# Patient Record
Sex: Female | Born: 1950
Health system: Southern US, Community
[De-identification: ages and names within clinical notes are randomized; demographics above are authoritative.]

## PROBLEM LIST (undated history)

## (undated) DIAGNOSIS — B20 Human immunodeficiency virus [HIV] disease: Secondary | ICD-10-CM

## (undated) DIAGNOSIS — Z923 Personal history of irradiation: Secondary | ICD-10-CM

## (undated) DIAGNOSIS — M4800 Spinal stenosis, site unspecified: Secondary | ICD-10-CM

## (undated) DIAGNOSIS — I10 Essential (primary) hypertension: Secondary | ICD-10-CM

## (undated) DIAGNOSIS — Z9221 Personal history of antineoplastic chemotherapy: Secondary | ICD-10-CM

## (undated) DIAGNOSIS — G629 Polyneuropathy, unspecified: Secondary | ICD-10-CM

## (undated) DIAGNOSIS — C50412 Malignant neoplasm of upper-outer quadrant of left female breast: Secondary | ICD-10-CM

## (undated) DIAGNOSIS — G43909 Migraine, unspecified, not intractable, without status migrainosus: Secondary | ICD-10-CM

## (undated) DIAGNOSIS — Z9889 Other specified postprocedural states: Secondary | ICD-10-CM

## (undated) DIAGNOSIS — R112 Nausea with vomiting, unspecified: Secondary | ICD-10-CM

## (undated) DIAGNOSIS — R21 Rash and other nonspecific skin eruption: Secondary | ICD-10-CM

## (undated) DIAGNOSIS — Z21 Asymptomatic human immunodeficiency virus [HIV] infection status: Secondary | ICD-10-CM

## (undated) HISTORY — DX: Polyneuropathy, unspecified: G62.9

## (undated) HISTORY — DX: Malignant neoplasm of upper-outer quadrant of left female breast: C50.412

## (undated) HISTORY — DX: Rash and other nonspecific skin eruption: R21

## (undated) HISTORY — PX: TONSILLECTOMY: SUR1361

## (undated) HISTORY — PX: CARPAL TUNNEL RELEASE: SHX101

---

## 1987-01-29 HISTORY — PX: CERVICAL FUSION: SHX112

## 1988-01-29 HISTORY — PX: ABDOMINAL HYSTERECTOMY: SHX81

## 1990-11-19 ENCOUNTER — Encounter (INDEPENDENT_AMBULATORY_CARE_PROVIDER_SITE_OTHER): Payer: Self-pay | Admitting: *Deleted

## 1990-11-19 LAB — CONVERTED CEMR LAB: CD4 T Cell Abs: 218

## 1997-05-02 ENCOUNTER — Encounter: Admission: RE | Admit: 1997-05-02 | Discharge: 1997-05-02 | Payer: Self-pay | Admitting: Infectious Diseases

## 1997-05-13 ENCOUNTER — Encounter: Admission: RE | Admit: 1997-05-13 | Discharge: 1997-05-13 | Payer: Self-pay | Admitting: Infectious Diseases

## 1997-08-08 ENCOUNTER — Encounter: Admission: RE | Admit: 1997-08-08 | Discharge: 1997-08-08 | Payer: Self-pay | Admitting: Infectious Diseases

## 1997-08-25 ENCOUNTER — Encounter: Admission: RE | Admit: 1997-08-25 | Discharge: 1997-08-25 | Payer: Self-pay | Admitting: Infectious Diseases

## 1997-09-27 ENCOUNTER — Encounter: Admission: RE | Admit: 1997-09-27 | Discharge: 1997-09-27 | Payer: Self-pay | Admitting: Obstetrics and Gynecology

## 1997-10-06 ENCOUNTER — Encounter: Admission: RE | Admit: 1997-10-06 | Discharge: 1997-10-06 | Payer: Self-pay | Admitting: Infectious Diseases

## 1997-12-05 ENCOUNTER — Encounter: Admission: RE | Admit: 1997-12-05 | Discharge: 1997-12-05 | Payer: Self-pay | Admitting: Internal Medicine

## 1997-12-19 ENCOUNTER — Encounter: Admission: RE | Admit: 1997-12-19 | Discharge: 1997-12-19 | Payer: Self-pay | Admitting: Infectious Diseases

## 1998-01-05 ENCOUNTER — Encounter: Admission: RE | Admit: 1998-01-05 | Discharge: 1998-01-05 | Payer: Self-pay | Admitting: Infectious Diseases

## 1998-01-25 ENCOUNTER — Ambulatory Visit (HOSPITAL_COMMUNITY): Admission: RE | Admit: 1998-01-25 | Discharge: 1998-01-25 | Payer: Self-pay | Admitting: Infectious Diseases

## 1998-01-25 ENCOUNTER — Encounter: Payer: Self-pay | Admitting: Infectious Diseases

## 1998-03-28 ENCOUNTER — Ambulatory Visit (HOSPITAL_COMMUNITY): Admission: RE | Admit: 1998-03-28 | Discharge: 1998-03-28 | Payer: Self-pay | Admitting: Infectious Diseases

## 1998-04-13 ENCOUNTER — Encounter: Admission: RE | Admit: 1998-04-13 | Discharge: 1998-04-13 | Payer: Self-pay | Admitting: Internal Medicine

## 1998-07-06 ENCOUNTER — Ambulatory Visit (HOSPITAL_COMMUNITY): Admission: RE | Admit: 1998-07-06 | Discharge: 1998-07-06 | Payer: Self-pay | Admitting: Internal Medicine

## 1998-07-21 ENCOUNTER — Encounter: Admission: RE | Admit: 1998-07-21 | Discharge: 1998-07-21 | Payer: Self-pay | Admitting: Infectious Diseases

## 1998-12-04 ENCOUNTER — Encounter: Admission: RE | Admit: 1998-12-04 | Discharge: 1998-12-04 | Payer: Self-pay | Admitting: Infectious Diseases

## 1999-01-01 ENCOUNTER — Encounter: Admission: RE | Admit: 1999-01-01 | Discharge: 1999-01-01 | Payer: Self-pay | Admitting: Internal Medicine

## 1999-01-01 ENCOUNTER — Ambulatory Visit (HOSPITAL_COMMUNITY): Admission: RE | Admit: 1999-01-01 | Discharge: 1999-01-01 | Payer: Self-pay | Admitting: Internal Medicine

## 1999-01-19 ENCOUNTER — Encounter: Admission: RE | Admit: 1999-01-19 | Discharge: 1999-01-19 | Payer: Self-pay | Admitting: Infectious Diseases

## 1999-01-26 ENCOUNTER — Encounter: Payer: Self-pay | Admitting: Infectious Diseases

## 1999-01-26 ENCOUNTER — Ambulatory Visit (HOSPITAL_COMMUNITY): Admission: RE | Admit: 1999-01-26 | Discharge: 1999-01-26 | Payer: Self-pay | Admitting: Infectious Diseases

## 1999-04-19 ENCOUNTER — Encounter: Admission: RE | Admit: 1999-04-19 | Discharge: 1999-04-19 | Payer: Self-pay | Admitting: Internal Medicine

## 1999-04-19 ENCOUNTER — Ambulatory Visit (HOSPITAL_COMMUNITY): Admission: RE | Admit: 1999-04-19 | Discharge: 1999-04-19 | Payer: Self-pay | Admitting: Internal Medicine

## 1999-05-03 ENCOUNTER — Encounter: Admission: RE | Admit: 1999-05-03 | Discharge: 1999-05-03 | Payer: Self-pay | Admitting: Infectious Diseases

## 1999-06-04 ENCOUNTER — Encounter: Admission: RE | Admit: 1999-06-04 | Discharge: 1999-06-04 | Payer: Self-pay | Admitting: Internal Medicine

## 1999-06-05 ENCOUNTER — Encounter: Payer: Self-pay | Admitting: Infectious Diseases

## 1999-06-05 ENCOUNTER — Ambulatory Visit (HOSPITAL_COMMUNITY): Admission: RE | Admit: 1999-06-05 | Discharge: 1999-06-05 | Payer: Self-pay | Admitting: Infectious Diseases

## 1999-06-06 ENCOUNTER — Encounter: Admission: RE | Admit: 1999-06-06 | Discharge: 1999-06-06 | Payer: Self-pay | Admitting: Internal Medicine

## 1999-06-13 ENCOUNTER — Encounter: Admission: RE | Admit: 1999-06-13 | Discharge: 1999-06-13 | Payer: Self-pay | Admitting: Infectious Diseases

## 1999-06-29 ENCOUNTER — Encounter: Admission: RE | Admit: 1999-06-29 | Discharge: 1999-06-29 | Payer: Self-pay | Admitting: Infectious Diseases

## 1999-08-28 ENCOUNTER — Encounter: Admission: RE | Admit: 1999-08-28 | Discharge: 1999-08-28 | Payer: Self-pay | Admitting: Infectious Diseases

## 1999-08-28 ENCOUNTER — Ambulatory Visit (HOSPITAL_COMMUNITY): Admission: RE | Admit: 1999-08-28 | Discharge: 1999-08-28 | Payer: Self-pay | Admitting: Infectious Diseases

## 1999-09-13 ENCOUNTER — Encounter: Admission: RE | Admit: 1999-09-13 | Discharge: 1999-09-13 | Payer: Self-pay | Admitting: Infectious Diseases

## 1999-12-04 ENCOUNTER — Encounter: Admission: RE | Admit: 1999-12-04 | Discharge: 1999-12-04 | Payer: Self-pay | Admitting: Hematology and Oncology

## 1999-12-18 ENCOUNTER — Ambulatory Visit (HOSPITAL_COMMUNITY): Admission: RE | Admit: 1999-12-18 | Discharge: 1999-12-18 | Payer: Self-pay | Admitting: Infectious Diseases

## 1999-12-18 ENCOUNTER — Encounter: Admission: RE | Admit: 1999-12-18 | Discharge: 1999-12-18 | Payer: Self-pay | Admitting: Infectious Diseases

## 2000-01-03 ENCOUNTER — Encounter: Admission: RE | Admit: 2000-01-03 | Discharge: 2000-01-03 | Payer: Self-pay | Admitting: Infectious Diseases

## 2000-01-28 ENCOUNTER — Encounter: Payer: Self-pay | Admitting: Infectious Diseases

## 2000-01-28 ENCOUNTER — Ambulatory Visit (HOSPITAL_COMMUNITY): Admission: RE | Admit: 2000-01-28 | Discharge: 2000-01-28 | Payer: Self-pay | Admitting: Infectious Diseases

## 2000-02-29 ENCOUNTER — Encounter: Admission: RE | Admit: 2000-02-29 | Discharge: 2000-02-29 | Payer: Self-pay | Admitting: Infectious Diseases

## 2000-04-10 ENCOUNTER — Encounter: Admission: RE | Admit: 2000-04-10 | Discharge: 2000-04-10 | Payer: Self-pay | Admitting: Infectious Diseases

## 2000-04-10 ENCOUNTER — Ambulatory Visit (HOSPITAL_COMMUNITY): Admission: RE | Admit: 2000-04-10 | Discharge: 2000-04-10 | Payer: Self-pay | Admitting: Infectious Diseases

## 2000-04-24 ENCOUNTER — Encounter: Admission: RE | Admit: 2000-04-24 | Discharge: 2000-04-24 | Payer: Self-pay | Admitting: Infectious Diseases

## 2000-07-21 ENCOUNTER — Ambulatory Visit (HOSPITAL_COMMUNITY): Admission: RE | Admit: 2000-07-21 | Discharge: 2000-07-21 | Payer: Self-pay | Admitting: Infectious Diseases

## 2000-07-21 ENCOUNTER — Encounter: Admission: RE | Admit: 2000-07-21 | Discharge: 2000-07-21 | Payer: Self-pay | Admitting: Internal Medicine

## 2000-08-14 ENCOUNTER — Encounter: Admission: RE | Admit: 2000-08-14 | Discharge: 2000-08-14 | Payer: Self-pay | Admitting: Infectious Diseases

## 2000-09-08 ENCOUNTER — Ambulatory Visit (HOSPITAL_COMMUNITY): Admission: RE | Admit: 2000-09-08 | Discharge: 2000-09-08 | Payer: Self-pay | Admitting: Infectious Diseases

## 2000-09-08 ENCOUNTER — Encounter: Admission: RE | Admit: 2000-09-08 | Discharge: 2000-09-08 | Payer: Self-pay | Admitting: Infectious Diseases

## 2000-10-09 ENCOUNTER — Encounter: Admission: RE | Admit: 2000-10-09 | Discharge: 2000-10-09 | Payer: Self-pay | Admitting: Infectious Diseases

## 2000-11-13 ENCOUNTER — Encounter: Admission: RE | Admit: 2000-11-13 | Discharge: 2000-11-13 | Payer: Self-pay | Admitting: Infectious Diseases

## 2000-12-23 ENCOUNTER — Encounter: Admission: RE | Admit: 2000-12-23 | Discharge: 2000-12-23 | Payer: Self-pay | Admitting: Infectious Diseases

## 2000-12-23 ENCOUNTER — Ambulatory Visit (HOSPITAL_COMMUNITY): Admission: RE | Admit: 2000-12-23 | Discharge: 2000-12-23 | Payer: Self-pay | Admitting: Infectious Diseases

## 2001-01-08 ENCOUNTER — Encounter: Admission: RE | Admit: 2001-01-08 | Discharge: 2001-01-08 | Payer: Self-pay | Admitting: Infectious Diseases

## 2001-02-02 ENCOUNTER — Encounter: Payer: Self-pay | Admitting: Infectious Diseases

## 2001-02-02 ENCOUNTER — Ambulatory Visit (HOSPITAL_COMMUNITY): Admission: RE | Admit: 2001-02-02 | Discharge: 2001-02-02 | Payer: Self-pay | Admitting: Infectious Diseases

## 2001-04-14 ENCOUNTER — Ambulatory Visit (HOSPITAL_COMMUNITY): Admission: RE | Admit: 2001-04-14 | Discharge: 2001-04-14 | Payer: Self-pay | Admitting: Internal Medicine

## 2001-04-14 ENCOUNTER — Encounter: Admission: RE | Admit: 2001-04-14 | Discharge: 2001-04-14 | Payer: Self-pay | Admitting: Internal Medicine

## 2001-05-13 ENCOUNTER — Encounter: Admission: RE | Admit: 2001-05-13 | Discharge: 2001-05-13 | Payer: Self-pay | Admitting: Infectious Diseases

## 2001-05-27 ENCOUNTER — Encounter: Admission: RE | Admit: 2001-05-27 | Discharge: 2001-05-27 | Payer: Self-pay | Admitting: Infectious Diseases

## 2001-07-01 ENCOUNTER — Encounter: Admission: RE | Admit: 2001-07-01 | Discharge: 2001-07-01 | Payer: Self-pay | Admitting: Internal Medicine

## 2001-07-01 ENCOUNTER — Ambulatory Visit (HOSPITAL_COMMUNITY): Admission: RE | Admit: 2001-07-01 | Discharge: 2001-07-01 | Payer: Self-pay | Admitting: Infectious Diseases

## 2001-07-16 ENCOUNTER — Encounter: Admission: RE | Admit: 2001-07-16 | Discharge: 2001-07-16 | Payer: Self-pay | Admitting: Infectious Diseases

## 2001-08-26 ENCOUNTER — Encounter: Admission: RE | Admit: 2001-08-26 | Discharge: 2001-08-26 | Payer: Self-pay | Admitting: Infectious Diseases

## 2001-09-14 ENCOUNTER — Encounter: Admission: RE | Admit: 2001-09-14 | Discharge: 2001-09-14 | Payer: Self-pay | Admitting: Infectious Diseases

## 2001-09-14 ENCOUNTER — Ambulatory Visit (HOSPITAL_COMMUNITY): Admission: RE | Admit: 2001-09-14 | Discharge: 2001-09-14 | Payer: Self-pay | Admitting: Infectious Diseases

## 2001-10-01 ENCOUNTER — Encounter: Admission: RE | Admit: 2001-10-01 | Discharge: 2001-10-01 | Payer: Self-pay | Admitting: Infectious Diseases

## 2001-10-27 ENCOUNTER — Ambulatory Visit (HOSPITAL_COMMUNITY): Admission: RE | Admit: 2001-10-27 | Discharge: 2001-10-27 | Payer: Self-pay | Admitting: Infectious Diseases

## 2001-10-27 ENCOUNTER — Encounter: Admission: RE | Admit: 2001-10-27 | Discharge: 2001-10-27 | Payer: Self-pay | Admitting: Infectious Diseases

## 2001-11-12 ENCOUNTER — Encounter: Admission: RE | Admit: 2001-11-12 | Discharge: 2001-11-12 | Payer: Self-pay | Admitting: Infectious Diseases

## 2001-12-28 ENCOUNTER — Ambulatory Visit (HOSPITAL_COMMUNITY): Admission: RE | Admit: 2001-12-28 | Discharge: 2001-12-28 | Payer: Self-pay | Admitting: Infectious Diseases

## 2001-12-28 ENCOUNTER — Encounter: Admission: RE | Admit: 2001-12-28 | Discharge: 2001-12-28 | Payer: Self-pay | Admitting: Infectious Diseases

## 2002-01-19 ENCOUNTER — Encounter: Admission: RE | Admit: 2002-01-19 | Discharge: 2002-01-19 | Payer: Self-pay | Admitting: Infectious Diseases

## 2002-03-04 ENCOUNTER — Encounter: Admission: RE | Admit: 2002-03-04 | Discharge: 2002-03-04 | Payer: Self-pay | Admitting: Infectious Diseases

## 2002-04-14 ENCOUNTER — Encounter: Admission: RE | Admit: 2002-04-14 | Discharge: 2002-04-14 | Payer: Self-pay | Admitting: Infectious Diseases

## 2002-04-29 ENCOUNTER — Encounter: Admission: RE | Admit: 2002-04-29 | Discharge: 2002-04-29 | Payer: Self-pay | Admitting: Infectious Diseases

## 2002-07-12 ENCOUNTER — Encounter: Admission: RE | Admit: 2002-07-12 | Discharge: 2002-07-12 | Payer: Self-pay | Admitting: Infectious Diseases

## 2002-07-12 ENCOUNTER — Encounter: Payer: Self-pay | Admitting: Infectious Diseases

## 2002-07-12 ENCOUNTER — Ambulatory Visit (HOSPITAL_COMMUNITY): Admission: RE | Admit: 2002-07-12 | Discharge: 2002-07-12 | Payer: Self-pay | Admitting: Infectious Diseases

## 2002-09-09 ENCOUNTER — Encounter: Admission: RE | Admit: 2002-09-09 | Discharge: 2002-09-09 | Payer: Self-pay | Admitting: Infectious Diseases

## 2002-12-07 ENCOUNTER — Ambulatory Visit (HOSPITAL_COMMUNITY): Admission: RE | Admit: 2002-12-07 | Discharge: 2002-12-07 | Payer: Self-pay | Admitting: Infectious Diseases

## 2002-12-07 ENCOUNTER — Encounter: Payer: Self-pay | Admitting: Infectious Diseases

## 2002-12-07 ENCOUNTER — Encounter: Admission: RE | Admit: 2002-12-07 | Discharge: 2002-12-07 | Payer: Self-pay | Admitting: Infectious Diseases

## 2002-12-28 ENCOUNTER — Encounter: Admission: RE | Admit: 2002-12-28 | Discharge: 2002-12-28 | Payer: Self-pay | Admitting: Infectious Diseases

## 2003-03-23 ENCOUNTER — Encounter: Admission: RE | Admit: 2003-03-23 | Discharge: 2003-03-23 | Payer: Self-pay | Admitting: Infectious Diseases

## 2003-04-19 ENCOUNTER — Ambulatory Visit (HOSPITAL_COMMUNITY): Admission: RE | Admit: 2003-04-19 | Discharge: 2003-04-19 | Payer: Self-pay | Admitting: Infectious Diseases

## 2003-04-19 ENCOUNTER — Encounter: Admission: RE | Admit: 2003-04-19 | Discharge: 2003-04-19 | Payer: Self-pay | Admitting: Infectious Diseases

## 2003-05-06 ENCOUNTER — Encounter: Admission: RE | Admit: 2003-05-06 | Discharge: 2003-05-06 | Payer: Self-pay | Admitting: Infectious Diseases

## 2003-06-21 ENCOUNTER — Encounter: Admission: RE | Admit: 2003-06-21 | Discharge: 2003-06-21 | Payer: Self-pay | Admitting: Infectious Diseases

## 2003-06-27 ENCOUNTER — Emergency Department (HOSPITAL_COMMUNITY): Admission: EM | Admit: 2003-06-27 | Discharge: 2003-06-28 | Payer: Self-pay | Admitting: *Deleted

## 2003-08-19 ENCOUNTER — Encounter: Admission: RE | Admit: 2003-08-19 | Discharge: 2003-08-19 | Payer: Self-pay | Admitting: Infectious Diseases

## 2003-12-14 ENCOUNTER — Ambulatory Visit (HOSPITAL_COMMUNITY): Admission: RE | Admit: 2003-12-14 | Discharge: 2003-12-14 | Payer: Self-pay | Admitting: Infectious Diseases

## 2003-12-14 ENCOUNTER — Ambulatory Visit: Payer: Self-pay | Admitting: Infectious Diseases

## 2003-12-30 ENCOUNTER — Ambulatory Visit: Payer: Self-pay | Admitting: Infectious Diseases

## 2004-01-31 ENCOUNTER — Ambulatory Visit (HOSPITAL_COMMUNITY): Admission: RE | Admit: 2004-01-31 | Discharge: 2004-01-31 | Payer: Self-pay | Admitting: Infectious Diseases

## 2004-02-09 ENCOUNTER — Ambulatory Visit: Payer: Self-pay | Admitting: Infectious Diseases

## 2004-03-09 ENCOUNTER — Ambulatory Visit: Payer: Self-pay | Admitting: Infectious Diseases

## 2004-03-29 ENCOUNTER — Ambulatory Visit (HOSPITAL_COMMUNITY): Admission: RE | Admit: 2004-03-29 | Discharge: 2004-03-29 | Payer: Self-pay | Admitting: Neurosurgery

## 2004-04-11 ENCOUNTER — Ambulatory Visit: Payer: Self-pay | Admitting: Infectious Diseases

## 2004-04-11 ENCOUNTER — Ambulatory Visit (HOSPITAL_COMMUNITY): Admission: RE | Admit: 2004-04-11 | Discharge: 2004-04-11 | Payer: Self-pay | Admitting: Infectious Diseases

## 2004-04-30 ENCOUNTER — Ambulatory Visit (HOSPITAL_COMMUNITY): Admission: RE | Admit: 2004-04-30 | Discharge: 2004-04-30 | Payer: Self-pay | Admitting: Neurosurgery

## 2004-05-10 ENCOUNTER — Ambulatory Visit: Payer: Self-pay | Admitting: Infectious Diseases

## 2004-05-16 ENCOUNTER — Encounter: Admission: RE | Admit: 2004-05-16 | Discharge: 2004-05-16 | Payer: Self-pay | Admitting: Orthopaedic Surgery

## 2004-06-01 ENCOUNTER — Encounter: Admission: RE | Admit: 2004-06-01 | Discharge: 2004-06-01 | Payer: Self-pay | Admitting: Orthopaedic Surgery

## 2004-07-05 ENCOUNTER — Encounter: Admission: RE | Admit: 2004-07-05 | Discharge: 2004-07-05 | Payer: Self-pay | Admitting: Orthopaedic Surgery

## 2004-08-08 ENCOUNTER — Ambulatory Visit (HOSPITAL_COMMUNITY): Admission: RE | Admit: 2004-08-08 | Discharge: 2004-08-08 | Payer: Self-pay | Admitting: Infectious Diseases

## 2004-08-08 ENCOUNTER — Ambulatory Visit: Payer: Self-pay | Admitting: Infectious Diseases

## 2004-08-23 ENCOUNTER — Ambulatory Visit: Payer: Self-pay | Admitting: Infectious Diseases

## 2004-09-20 ENCOUNTER — Ambulatory Visit: Payer: Self-pay | Admitting: Infectious Diseases

## 2004-11-08 ENCOUNTER — Ambulatory Visit (HOSPITAL_COMMUNITY): Admission: RE | Admit: 2004-11-08 | Discharge: 2004-11-08 | Payer: Self-pay | Admitting: Infectious Diseases

## 2004-11-08 ENCOUNTER — Ambulatory Visit: Payer: Self-pay | Admitting: Infectious Diseases

## 2005-01-30 ENCOUNTER — Encounter (INDEPENDENT_AMBULATORY_CARE_PROVIDER_SITE_OTHER): Payer: Self-pay | Admitting: *Deleted

## 2005-01-30 ENCOUNTER — Ambulatory Visit: Payer: Self-pay | Admitting: Infectious Diseases

## 2005-01-30 ENCOUNTER — Ambulatory Visit (HOSPITAL_COMMUNITY): Admission: RE | Admit: 2005-01-30 | Discharge: 2005-01-30 | Payer: Self-pay | Admitting: Infectious Diseases

## 2005-01-30 LAB — CONVERTED CEMR LAB: CD4 Count: 800 microliters

## 2005-04-05 ENCOUNTER — Ambulatory Visit: Payer: Self-pay | Admitting: Infectious Diseases

## 2005-04-10 ENCOUNTER — Ambulatory Visit (HOSPITAL_COMMUNITY): Admission: RE | Admit: 2005-04-10 | Discharge: 2005-04-10 | Payer: Self-pay | Admitting: Internal Medicine

## 2005-07-10 ENCOUNTER — Encounter: Admission: RE | Admit: 2005-07-10 | Discharge: 2005-07-10 | Payer: Self-pay | Admitting: Infectious Diseases

## 2005-07-10 ENCOUNTER — Encounter (INDEPENDENT_AMBULATORY_CARE_PROVIDER_SITE_OTHER): Payer: Self-pay | Admitting: *Deleted

## 2005-07-10 ENCOUNTER — Ambulatory Visit: Payer: Self-pay | Admitting: Infectious Diseases

## 2005-07-10 LAB — CONVERTED CEMR LAB: HIV 1 RNA Quant: 399 copies/mL

## 2005-07-25 ENCOUNTER — Ambulatory Visit: Payer: Self-pay | Admitting: Infectious Diseases

## 2005-11-06 ENCOUNTER — Ambulatory Visit: Payer: Self-pay | Admitting: Internal Medicine

## 2005-11-06 ENCOUNTER — Encounter: Admission: RE | Admit: 2005-11-06 | Discharge: 2005-11-06 | Payer: Self-pay | Admitting: Internal Medicine

## 2005-11-06 ENCOUNTER — Encounter (INDEPENDENT_AMBULATORY_CARE_PROVIDER_SITE_OTHER): Payer: Self-pay | Admitting: *Deleted

## 2005-11-06 ENCOUNTER — Encounter: Payer: Self-pay | Admitting: Infectious Diseases

## 2005-11-06 LAB — CONVERTED CEMR LAB
ALT: 25 units/L (ref 0–40)
AST: 28 units/L (ref 0–37)
Basophils Absolute: 0.1 10*3/uL (ref 0.0–0.1)
Basophils percent auto: 1 % (ref 0–1)
CO2: 30 meq/L (ref 19–32)
Calcium: 9.7 mg/dL (ref 8.4–10.5)
Chloride: 99 meq/L (ref 96–112)
Creatinine, Ser: 0.76 mg/dL (ref 0.40–1.20)
HIV 1 RNA Quant: 77 copies/mL — ABNORMAL HIGH (ref <50)
Lymphocytes Relative: 36 % (ref 12–46)
MCHC: 34.1 g/dL (ref 30.0–36.0)
Monocytes Absolute: 0.6 10*3/uL (ref 0.2–0.7)
Neutro Abs: 4.1 10*3/uL (ref 1.7–7.7)
Neutrophils Relative %: 52 % (ref 43–77)
Platelets: 357 10*3/uL (ref 150–400)
RDW: 12.9 % (ref 11.5–14.0)
Sodium: 138 meq/L (ref 135–145)
Total Protein: 7.8 g/dL (ref 6.0–8.3)

## 2005-11-08 DIAGNOSIS — E785 Hyperlipidemia, unspecified: Secondary | ICD-10-CM | POA: Insufficient documentation

## 2005-11-08 DIAGNOSIS — B20 Human immunodeficiency virus [HIV] disease: Secondary | ICD-10-CM | POA: Insufficient documentation

## 2005-11-08 DIAGNOSIS — I10 Essential (primary) hypertension: Secondary | ICD-10-CM | POA: Insufficient documentation

## 2005-11-08 DIAGNOSIS — K5090 Crohn's disease, unspecified, without complications: Secondary | ICD-10-CM | POA: Insufficient documentation

## 2005-11-08 DIAGNOSIS — J449 Chronic obstructive pulmonary disease, unspecified: Secondary | ICD-10-CM | POA: Insufficient documentation

## 2005-11-08 DIAGNOSIS — R071 Chest pain on breathing: Secondary | ICD-10-CM | POA: Insufficient documentation

## 2005-11-22 ENCOUNTER — Ambulatory Visit: Payer: Self-pay | Admitting: Infectious Diseases

## 2005-12-26 ENCOUNTER — Ambulatory Visit: Payer: Self-pay | Admitting: Infectious Diseases

## 2006-02-05 ENCOUNTER — Encounter: Admission: RE | Admit: 2006-02-05 | Discharge: 2006-02-05 | Payer: Self-pay | Admitting: Infectious Diseases

## 2006-02-05 ENCOUNTER — Ambulatory Visit: Payer: Self-pay | Admitting: Infectious Diseases

## 2006-02-05 ENCOUNTER — Encounter (INDEPENDENT_AMBULATORY_CARE_PROVIDER_SITE_OTHER): Payer: Self-pay | Admitting: *Deleted

## 2006-02-05 LAB — CONVERTED CEMR LAB
CD4 Count: 420 microliters
HIV 1 RNA Quant: 246 copies/mL

## 2006-02-06 ENCOUNTER — Encounter: Payer: Self-pay | Admitting: Infectious Diseases

## 2006-02-06 LAB — CONVERTED CEMR LAB
ALT: 15 units/L (ref 0–35)
AST: 18 units/L (ref 0–37)
Alkaline Phosphatase: 121 units/L — ABNORMAL HIGH (ref 39–117)
BUN: 14 mg/dL (ref 6–23)
Basophils Absolute: 0 10*3/uL (ref 0.0–0.1)
Basophils Relative: 0 % (ref 0–1)
Creatinine, Ser: 0.7 mg/dL (ref 0.40–1.20)
Eosinophils Relative: 1 % (ref 0–5)
HCT: 45 % (ref 36.0–46.0)
HDL: 50 mg/dL (ref >39)
LDL Cholesterol: 168 mg/dL — ABNORMAL HIGH (ref 0–99)
Lymphs Abs: 1.3 10*3/uL (ref 0.7–3.3)
MCHC: 33.6 g/dL (ref 30.0–36.0)
MCV: 95.5 fL (ref 78.0–100.0)
Monocytes Relative: 10 % (ref 3–11)
Platelets: 340 10*3/uL (ref 150–400)
RBC: 4.71 M/uL (ref 3.87–5.11)
Total CHOL/HDL Ratio: 5.1
VLDL: 35 mg/dL (ref 0–40)
WBC: 6.9 10*3/uL (ref 4.0–10.5)

## 2006-02-20 ENCOUNTER — Ambulatory Visit: Payer: Self-pay | Admitting: Infectious Diseases

## 2006-03-24 ENCOUNTER — Encounter (INDEPENDENT_AMBULATORY_CARE_PROVIDER_SITE_OTHER): Payer: Self-pay | Admitting: *Deleted

## 2006-03-24 LAB — CONVERTED CEMR LAB

## 2006-04-06 ENCOUNTER — Encounter (INDEPENDENT_AMBULATORY_CARE_PROVIDER_SITE_OTHER): Payer: Self-pay | Admitting: *Deleted

## 2006-04-24 ENCOUNTER — Telehealth: Payer: Self-pay | Admitting: Infectious Diseases

## 2006-05-06 ENCOUNTER — Encounter: Payer: Self-pay | Admitting: Infectious Diseases

## 2006-05-22 ENCOUNTER — Encounter: Payer: Self-pay | Admitting: Infectious Diseases

## 2006-06-04 ENCOUNTER — Ambulatory Visit: Payer: Self-pay | Admitting: Infectious Diseases

## 2006-06-04 ENCOUNTER — Encounter: Admission: RE | Admit: 2006-06-04 | Discharge: 2006-06-04 | Payer: Self-pay | Admitting: Infectious Diseases

## 2006-06-04 LAB — CONVERTED CEMR LAB
ALT: 18 units/L (ref 0–35)
Basophils Absolute: 0 10*3/uL (ref 0.0–0.1)
CO2: 27 meq/L (ref 19–32)
Creatinine, Ser: 0.71 mg/dL (ref 0.40–1.20)
Eosinophils Absolute: 0.1 10*3/uL (ref 0.0–0.7)
Eosinophils Relative: 2 % (ref 0–5)
Glucose, Bld: 83 mg/dL (ref 70–99)
HCT: 44.2 % (ref 36.0–46.0)
HIV 1 RNA Quant: 91 copies/mL — ABNORMAL HIGH (ref <50)
HIV-1 RNA Quant, Log: 1.96 — ABNORMAL HIGH (ref <1.70)
Hemoglobin: 14.9 g/dL (ref 12.0–15.0)
Lymphocytes Relative: 36 % (ref 12–46)
MCV: 92.7 fL (ref 78.0–100.0)
Monocytes Absolute: 0.5 10*3/uL (ref 0.2–0.7)
RDW: 13.4 % (ref 11.5–14.0)
Total Bilirubin: 3 mg/dL — ABNORMAL HIGH (ref 0.3–1.2)

## 2006-06-05 ENCOUNTER — Encounter: Payer: Self-pay | Admitting: Infectious Diseases

## 2006-06-06 ENCOUNTER — Ambulatory Visit (HOSPITAL_COMMUNITY): Admission: RE | Admit: 2006-06-06 | Discharge: 2006-06-06 | Payer: Self-pay | Admitting: Family Medicine

## 2006-07-01 ENCOUNTER — Ambulatory Visit: Payer: Self-pay | Admitting: Infectious Diseases

## 2006-07-23 ENCOUNTER — Telehealth: Payer: Self-pay | Admitting: Infectious Diseases

## 2006-10-24 ENCOUNTER — Telehealth: Payer: Self-pay | Admitting: Infectious Diseases

## 2006-11-12 ENCOUNTER — Encounter: Admission: RE | Admit: 2006-11-12 | Discharge: 2006-11-12 | Payer: Self-pay | Admitting: Infectious Diseases

## 2006-11-12 ENCOUNTER — Ambulatory Visit: Payer: Self-pay | Admitting: Infectious Diseases

## 2006-11-12 LAB — CONVERTED CEMR LAB
Albumin: 4.5 g/dL (ref 3.5–5.2)
BUN: 19 mg/dL (ref 6–23)
Calcium: 9.3 mg/dL (ref 8.4–10.5)
Chloride: 100 meq/L (ref 96–112)
Glucose, Bld: 84 mg/dL (ref 70–99)
HIV-1 RNA Quant, Log: 2.44 — ABNORMAL HIGH (ref <1.70)
Lymphs Abs: 2.5 10*3/uL (ref 0.7–3.3)
Monocytes Relative: 8 % (ref 3–11)
Neutro Abs: 3.1 10*3/uL (ref 1.7–7.7)
Neutrophils Relative %: 49 % (ref 43–77)
Potassium: 4 meq/L (ref 3.5–5.3)
RBC: 4.73 M/uL (ref 3.87–5.11)
WBC: 6.3 10*3/uL (ref 4.0–10.5)

## 2006-12-02 ENCOUNTER — Ambulatory Visit: Payer: Self-pay | Admitting: Internal Medicine

## 2006-12-02 DIAGNOSIS — J069 Acute upper respiratory infection, unspecified: Secondary | ICD-10-CM | POA: Insufficient documentation

## 2006-12-03 ENCOUNTER — Telehealth: Payer: Self-pay | Admitting: Infectious Diseases

## 2007-01-27 ENCOUNTER — Encounter: Payer: Self-pay | Admitting: Infectious Diseases

## 2007-02-19 ENCOUNTER — Encounter: Payer: Self-pay | Admitting: Infectious Diseases

## 2007-02-19 ENCOUNTER — Ambulatory Visit: Payer: Self-pay | Admitting: Internal Medicine

## 2007-02-19 ENCOUNTER — Encounter: Admission: RE | Admit: 2007-02-19 | Discharge: 2007-02-19 | Payer: Self-pay | Admitting: Infectious Diseases

## 2007-02-19 LAB — CONVERTED CEMR LAB
ALT: 14 units/L (ref 0–35)
AST: 19 units/L (ref 0–37)
Albumin: 4.3 g/dL (ref 3.5–5.2)
Alkaline Phosphatase: 73 units/L (ref 39–117)
BUN: 11 mg/dL (ref 6–23)
Basophils Absolute: 0 10*3/uL (ref 0.0–0.1)
Basophils Relative: 1 % (ref 0–1)
CO2: 27 meq/L (ref 19–32)
Calcium: 9 mg/dL (ref 8.4–10.5)
Chloride: 101 meq/L (ref 96–112)
Cholesterol: 237 mg/dL — ABNORMAL HIGH (ref 0–200)
Creatinine, Ser: 0.71 mg/dL (ref 0.40–1.20)
Eosinophils Absolute: 0.1 10*3/uL (ref 0.0–0.7)
Eosinophils Relative: 2 % (ref 0–5)
Glucose, Bld: 85 mg/dL (ref 70–99)
HCT: 42.8 % (ref 36.0–46.0)
HDL: 51 mg/dL (ref >39)
HIV 1 RNA Quant: <50 copies/mL (ref <50)
HIV-1 RNA Quant, Log: <1.7 (ref <1.70)
Hemoglobin: 14.6 g/dL (ref 12.0–15.0)
LDL Cholesterol: 155 mg/dL — ABNORMAL HIGH (ref 0–99)
Lymphocytes Relative: 34 % (ref 12–46)
Lymphs Abs: 2.2 10*3/uL (ref 0.7–4.0)
MCHC: 34.1 g/dL (ref 30.0–36.0)
MCV: 92 fL (ref 78.0–100.0)
Monocytes Absolute: 0.5 10*3/uL (ref 0.1–1.0)
Monocytes Relative: 8 % (ref 3–12)
Neutro Abs: 3.7 10*3/uL (ref 1.7–7.7)
Neutrophils Relative %: 56 % (ref 43–77)
Platelets: 343 10*3/uL (ref 150–400)
Potassium: 3.4 meq/L — ABNORMAL LOW (ref 3.5–5.3)
RBC: 4.65 M/uL (ref 3.87–5.11)
RDW: 12.5 % (ref 11.5–15.5)
Sodium: 139 meq/L (ref 135–145)
Total Bilirubin: 3.5 mg/dL — ABNORMAL HIGH (ref 0.3–1.2)
Total CHOL/HDL Ratio: 4.6
Total Protein: 7 g/dL (ref 6.0–8.3)
Triglycerides: 156 mg/dL — ABNORMAL HIGH (ref <150)
VLDL: 31 mg/dL (ref 0–40)
WBC: 6.6 10*3/uL (ref 4.0–10.5)

## 2007-02-20 ENCOUNTER — Encounter: Admission: RE | Admit: 2007-02-20 | Discharge: 2007-02-20 | Payer: Self-pay | Admitting: Orthopaedic Surgery

## 2007-03-21 ENCOUNTER — Inpatient Hospital Stay (HOSPITAL_COMMUNITY): Admission: EM | Admit: 2007-03-21 | Discharge: 2007-03-23 | Payer: Self-pay | Admitting: Emergency Medicine

## 2007-03-27 ENCOUNTER — Ambulatory Visit: Payer: Self-pay | Admitting: Infectious Diseases

## 2007-03-27 LAB — CONVERTED CEMR LAB
Calcium: 8.8 mg/dL (ref 8.4–10.5)
Creatinine, Ser: 0.46 mg/dL (ref 0.40–1.20)
Sodium: 133 meq/L — ABNORMAL LOW (ref 135–145)

## 2007-04-07 ENCOUNTER — Telehealth (INDEPENDENT_AMBULATORY_CARE_PROVIDER_SITE_OTHER): Payer: Self-pay | Admitting: *Deleted

## 2007-04-23 ENCOUNTER — Encounter: Payer: Self-pay | Admitting: Infectious Diseases

## 2007-04-24 ENCOUNTER — Encounter: Payer: Self-pay | Admitting: Infectious Diseases

## 2007-07-16 ENCOUNTER — Ambulatory Visit: Payer: Self-pay | Admitting: Infectious Diseases

## 2007-07-16 ENCOUNTER — Encounter: Admission: RE | Admit: 2007-07-16 | Discharge: 2007-07-16 | Payer: Self-pay | Admitting: Infectious Diseases

## 2007-07-16 LAB — CONVERTED CEMR LAB
ALT: 15 units/L (ref 0–35)
CO2: 25 meq/L (ref 19–32)
Calcium: 9 mg/dL (ref 8.4–10.5)
Chloride: 106 meq/L (ref 96–112)
Creatinine, Ser: 0.69 mg/dL (ref 0.40–1.20)
Eosinophils Relative: 2 % (ref 0–5)
Glucose, Bld: 80 mg/dL (ref 70–99)
HCT: 44.1 % (ref 36.0–46.0)
Hemoglobin: 14.8 g/dL (ref 12.0–15.0)
Lymphocytes Relative: 34 % (ref 12–46)
Lymphs Abs: 2.3 10*3/uL (ref 0.7–4.0)
Monocytes Absolute: 0.5 10*3/uL (ref 0.1–1.0)
Neutro Abs: 3.7 10*3/uL (ref 1.7–7.7)
RBC: 4.54 M/uL (ref 3.87–5.11)
Total Bilirubin: 2.9 mg/dL — ABNORMAL HIGH (ref 0.3–1.2)
Total Protein: 6.7 g/dL (ref 6.0–8.3)
WBC: 6.8 10*3/uL (ref 4.0–10.5)

## 2007-08-06 ENCOUNTER — Ambulatory Visit: Payer: Self-pay | Admitting: Infectious Diseases

## 2007-12-02 ENCOUNTER — Ambulatory Visit: Payer: Self-pay | Admitting: Infectious Diseases

## 2007-12-02 LAB — CONVERTED CEMR LAB
AST: 22 units/L (ref 0–37)
Albumin: 4.2 g/dL (ref 3.5–5.2)
Alkaline Phosphatase: 84 units/L (ref 39–117)
Basophils Relative: 0 % (ref 0–1)
Eosinophils Absolute: 0.1 10*3/uL (ref 0.0–0.7)
MCHC: 33.6 g/dL (ref 30.0–36.0)
MCV: 96.8 fL (ref 78.0–100.0)
Neutrophils Relative %: 57 % (ref 43–77)
Platelets: 318 10*3/uL (ref 150–400)
Potassium: 4.1 meq/L (ref 3.5–5.3)
RDW: 12.7 % (ref 11.5–15.5)
Sodium: 140 meq/L (ref 135–145)
Total Protein: 6.7 g/dL (ref 6.0–8.3)

## 2007-12-17 ENCOUNTER — Ambulatory Visit: Payer: Self-pay | Admitting: Infectious Diseases

## 2007-12-17 LAB — CONVERTED CEMR LAB

## 2008-05-04 ENCOUNTER — Ambulatory Visit: Payer: Self-pay | Admitting: Infectious Diseases

## 2008-05-04 LAB — CONVERTED CEMR LAB
ALT: 22 units/L (ref 0–35)
Albumin: 4.6 g/dL (ref 3.5–5.2)
BUN: 15 mg/dL (ref 6–23)
Basophils Absolute: 0 10*3/uL (ref 0.0–0.1)
Basophils Relative: 1 % (ref 0–1)
CO2: 25 meq/L (ref 19–32)
Calcium: 9.5 mg/dL (ref 8.4–10.5)
Chloride: 105 meq/L (ref 96–112)
Cholesterol: 254 mg/dL — ABNORMAL HIGH (ref 0–200)
Creatinine, Ser: 0.85 mg/dL (ref 0.40–1.20)
GFR calc Af Amer: >60 mL/min (ref >60)
HIV 1 RNA Quant: 68 copies/mL — ABNORMAL HIGH (ref <48)
HIV-1 RNA Quant, Log: 1.83 — ABNORMAL HIGH (ref <1.68)
MCHC: 33.3 g/dL (ref 30.0–36.0)
Monocytes Relative: 8 % (ref 3–12)
Neutro Abs: 3.6 10*3/uL (ref 1.7–7.7)
Neutrophils Relative %: 55 % (ref 43–77)
Platelets: 324 10*3/uL (ref 150–400)
RBC: 4.39 M/uL (ref 3.87–5.11)
RDW: 12.6 % (ref 11.5–15.5)

## 2008-05-19 ENCOUNTER — Ambulatory Visit: Payer: Self-pay | Admitting: Infectious Diseases

## 2008-08-24 ENCOUNTER — Ambulatory Visit: Payer: Self-pay | Admitting: Infectious Diseases

## 2008-08-24 LAB — CONVERTED CEMR LAB
HIV 1 RNA Quant: 245 copies/mL — ABNORMAL HIGH (ref <48)
HIV-1 RNA Quant, Log: 2.39 — ABNORMAL HIGH (ref <1.68)

## 2008-09-22 ENCOUNTER — Ambulatory Visit: Payer: Self-pay | Admitting: Infectious Diseases

## 2008-12-21 ENCOUNTER — Ambulatory Visit: Payer: Self-pay | Admitting: Infectious Diseases

## 2008-12-21 LAB — CONVERTED CEMR LAB
ALT: 24 units/L (ref 0–35)
AST: 28 units/L (ref 0–37)
Albumin: 4.5 g/dL (ref 3.5–5.2)
Alkaline Phosphatase: 65 units/L (ref 39–117)
BUN: 12 mg/dL (ref 6–23)
Basophils Absolute: 0 10*3/uL (ref 0.0–0.1)
Calcium: 9.3 mg/dL (ref 8.4–10.5)
Chloride: 102 meq/L (ref 96–112)
Creatinine, Ser: 0.8 mg/dL (ref 0.40–1.20)
Lymphocytes Relative: 37 % (ref 12–46)
Lymphs Abs: 2.3 10*3/uL (ref 0.7–4.0)
Neutrophils Relative %: 53 % (ref 43–77)
Platelets: 283 10*3/uL (ref 150–400)
Potassium: 4.6 meq/L (ref 3.5–5.3)
RDW: 12.4 % (ref 11.5–15.5)
WBC: 6.2 10*3/uL (ref 4.0–10.5)

## 2008-12-29 ENCOUNTER — Ambulatory Visit: Payer: Self-pay | Admitting: Infectious Diseases

## 2009-01-17 ENCOUNTER — Encounter: Admission: RE | Admit: 2009-01-17 | Discharge: 2009-01-17 | Payer: Self-pay | Admitting: Orthopaedic Surgery

## 2009-02-14 ENCOUNTER — Encounter: Payer: Self-pay | Admitting: Infectious Diseases

## 2009-02-18 ENCOUNTER — Encounter: Admission: RE | Admit: 2009-02-18 | Discharge: 2009-02-18 | Payer: Self-pay | Admitting: Otolaryngology

## 2009-03-09 ENCOUNTER — Telehealth: Payer: Self-pay | Admitting: Infectious Diseases

## 2009-03-20 ENCOUNTER — Encounter: Payer: Self-pay | Admitting: Infectious Diseases

## 2009-05-01 ENCOUNTER — Encounter: Payer: Self-pay | Admitting: Infectious Diseases

## 2009-05-04 ENCOUNTER — Ambulatory Visit: Payer: Self-pay | Admitting: Infectious Diseases

## 2009-05-04 LAB — CONVERTED CEMR LAB
ALT: 36 units/L — ABNORMAL HIGH (ref 0–35)
BUN: 15 mg/dL (ref 6–23)
Basophils Relative: 0 % (ref 0–1)
CO2: 26 meq/L (ref 19–32)
Calcium: 9.7 mg/dL (ref 8.4–10.5)
Chloride: 103 meq/L (ref 96–112)
Cholesterol: 298 mg/dL — ABNORMAL HIGH (ref 0–200)
Creatinine, Ser: 0.75 mg/dL (ref 0.40–1.20)
Eosinophils Absolute: 0.2 10*3/uL (ref 0.0–0.7)
Eosinophils Relative: 3 % (ref 0–5)
Glucose, Bld: 88 mg/dL (ref 70–99)
HCT: 43.4 % (ref 36.0–46.0)
HDL: 52 mg/dL (ref >39)
HIV 1 RNA Quant: <48 copies/mL (ref <48)
Lymphs Abs: 2.6 10*3/uL (ref 0.7–4.0)
MCHC: 33.2 g/dL (ref 30.0–36.0)
MCV: 97.3 fL (ref 78.0–100.0)
Monocytes Absolute: 0.4 10*3/uL (ref 0.1–1.0)
Monocytes Relative: 7 % (ref 3–12)
Neutrophils Relative %: 46 % (ref 43–77)
RBC: 4.46 M/uL (ref 3.87–5.11)
Total Bilirubin: 1.8 mg/dL — ABNORMAL HIGH (ref 0.3–1.2)
Total CHOL/HDL Ratio: 5.7
Triglycerides: 206 mg/dL — ABNORMAL HIGH (ref <150)
VLDL: 41 mg/dL — ABNORMAL HIGH (ref 0–40)
WBC: 5.9 10*3/uL (ref 4.0–10.5)

## 2009-05-18 ENCOUNTER — Ambulatory Visit: Payer: Self-pay | Admitting: Infectious Diseases

## 2009-09-04 ENCOUNTER — Ambulatory Visit: Payer: Self-pay | Admitting: Infectious Diseases

## 2009-09-04 LAB — CONVERTED CEMR LAB
ALT: 18 units/L (ref 0–35)
AST: 21 units/L (ref 0–37)
Albumin: 4.6 g/dL (ref 3.5–5.2)
Alkaline Phosphatase: 86 units/L (ref 39–117)
BUN: 15 mg/dL (ref 6–23)
Basophils Absolute: 0 10*3/uL (ref 0.0–0.1)
Basophils Relative: 1 % (ref 0–1)
CO2: 28 meq/L (ref 19–32)
Calcium: 9.5 mg/dL (ref 8.4–10.5)
Chloride: 103 meq/L (ref 96–112)
Creatinine, Ser: 0.78 mg/dL (ref 0.40–1.20)
Eosinophils Absolute: 0.2 10*3/uL (ref 0.0–0.7)
Eosinophils Relative: 3 % (ref 0–5)
Glucose, Bld: 66 mg/dL — ABNORMAL LOW (ref 70–99)
HCT: 43.1 % (ref 36.0–46.0)
HIV 1 RNA Quant: <48 copies/mL (ref <48)
HIV-1 RNA Quant, Log: <1.68 (ref <1.68)
Hemoglobin: 14.2 g/dL (ref 12.0–15.0)
Lymphocytes Relative: 35 % (ref 12–46)
Lymphs Abs: 2.2 10*3/uL (ref 0.7–4.0)
MCHC: 32.9 g/dL (ref 30.0–36.0)
MCV: 96.9 fL (ref 78.0–100.0)
Monocytes Absolute: 0.7 10*3/uL (ref 0.1–1.0)
Monocytes Relative: 12 % (ref 3–12)
Neutro Abs: 3.1 10*3/uL (ref 1.7–7.7)
Neutrophils Relative %: 51 % (ref 43–77)
Platelets: 298 10*3/uL (ref 150–400)
Potassium: 4.2 meq/L (ref 3.5–5.3)
RBC: 4.45 M/uL (ref 3.87–5.11)
RDW: 12.8 % (ref 11.5–15.5)
Sodium: 141 meq/L (ref 135–145)
Total Bilirubin: 1.9 mg/dL — ABNORMAL HIGH (ref 0.3–1.2)
Total Protein: 7 g/dL (ref 6.0–8.3)
WBC: 6.2 10*3/uL (ref 4.0–10.5)

## 2009-09-21 ENCOUNTER — Ambulatory Visit: Payer: Self-pay | Admitting: Infectious Diseases

## 2009-10-23 ENCOUNTER — Ambulatory Visit: Payer: Self-pay | Admitting: Infectious Diseases

## 2010-01-02 ENCOUNTER — Encounter: Admission: RE | Admit: 2010-01-02 | Discharge: 2010-01-02 | Payer: Self-pay | Admitting: Family Medicine

## 2010-01-16 ENCOUNTER — Ambulatory Visit: Payer: Self-pay | Admitting: Infectious Diseases

## 2010-01-16 ENCOUNTER — Encounter: Payer: Self-pay | Admitting: Infectious Diseases

## 2010-01-16 LAB — CONVERTED CEMR LAB
AST: 25 units/L (ref 0–37)
Albumin: 4.5 g/dL (ref 3.5–5.2)
Alkaline Phosphatase: 88 units/L (ref 39–117)
Basophils Absolute: 0 10*3/uL (ref 0.0–0.1)
Basophils Relative: 1 % (ref 0–1)
HIV 1 RNA Quant: <20 copies/mL (ref <20)
HIV-1 RNA Quant, Log: <1.3 (ref <1.30)
MCHC: 33.2 g/dL (ref 30.0–36.0)
Monocytes Absolute: 0.5 10*3/uL (ref 0.1–1.0)
Neutro Abs: 3.9 10*3/uL (ref 1.7–7.7)
Neutrophils Relative %: 53 % (ref 43–77)
Platelets: 308 10*3/uL (ref 150–400)
Potassium: 4.4 meq/L (ref 3.5–5.3)
RDW: 12.5 % (ref 11.5–15.5)
Sodium: 142 meq/L (ref 135–145)
Total Protein: 7.1 g/dL (ref 6.0–8.3)

## 2010-02-01 ENCOUNTER — Ambulatory Visit
Admission: RE | Admit: 2010-02-01 | Discharge: 2010-02-01 | Payer: Self-pay | Source: Home / Self Care | Attending: Infectious Diseases | Admitting: Infectious Diseases

## 2010-02-27 NOTE — Assessment & Plan Note (Signed)
Summary: f/u [mkj]   CC:  follow-up visit.  History of Present Illness: [name] is suppressed with HIV<48 copies and CD4 830. Most importantly [name] saw [name] in consultation for her episodic nystagmus and diagnosed migraines and she has respond nicely to therapy which is gabapentin 100 mg qid.  [name]'s cholesterol remains elevated with Total Cholesterol at 298 and LDL of 205, but she is not interested in taking a statin.  Other than her complex migraines, she has not had any recent illnesses or fevers.  Her last colonoscopy was over 2 years ago and her last mammogram was last year.  She continues to walk about 30 minutes a day.  She expressed concern about how to contact [name]. Maurice [name] in the new ID clinic.  Preventive Screening-Counseling & Management  Alcohol-Tobacco     Alcohol drinks/day: <1     Alcohol type: wine     Smoking Status: never  Caffeine-Diet-Exercise     Caffeine use/day: yes     Does Patient Exercise: yes     Type of exercise: walking 3 miles a day     Exercise (avg: min/session): >60     Times/week: 7  Hep-HIV-STD-Contraception     HIV Risk: no risk noted  Safety-Violence-Falls     Seat Belt Use: yes  Comments: declined condoms      Sexual History:  n/a.        Drug Use:  never.     Current Allergies (reviewed today): ! PCN ! * AZITROMYCIN Social History: Sexual History:  n/a  Allergies: 1)  ! Pcn 2)  ! * Azitromycin   Vital Signs:  Patient profile:   60 year old female Menstrual status:  hysterectomy Height:      65 inches (165.10 cm) Weight:      157 pounds (71.36 kg) BMI:     26.22 Temp:     97.9 degrees F (36.61 degrees C) oral Pulse rate:   98 / minute BP sitting:   130 / 83  (left arm) Cuff size:   regular  Vitals Entered By: [name] Maduro RN (May 18, 2009 2:11 PM) CC: follow-up visit Is Patient Diabetic? No Pain Assessment Patient in pain? no      Nutritional Status BMI of 19 -24 = normal Nutritional Status Detail appetite  "improved"  Have you ever been in a relationship where you felt threatened, hurt or afraid?No   Does patient need assistance? Functional Status Self care Ambulation Normal Comments no missed doses    Physical Exam  General:  alert, well-developed, well-nourished, and well-hydrated.   Head:  normocephalic.   Mouth:  good dentition and pharynx pink and moist.   Neck:  supple, no masses, no lymphadenopathy Lungs:  normal respiratory effort,CTAB, no crackles, no wheezes or rhonchi Heart:  RRR, no m/r/g   Impression & Recommendations:  Problem # 1:  HIV DISEASE (ICD-042) [name] is stable with her HIV as her viral load is undetectable and her CD4 count is 870.  She continues to take Norvir, Reyataz, and Truvada as directed with no major problems except hyperlipidemia.  She has had no recent illnesses or infections.  She will follow up in our clinic in 5 months.  Orders: Est. Patient Level IV (99214)Future Orders: T-CBC w/Diff ([id number]) ... 09/18/2009 T-CD4SP ([facility]) (CD4SP) ... 09/18/2009 T-Comprehensive Metabolic Panel [id number]) ... 09/18/2009 T-HIV Viral Load [id number]) ... 09/18/2009  Problem # 2:  HYPERLIPIDEMIA (ICD-272.4) [name]'s total cholesterol was elevated to 298 and her LDL was 205.  She expressed having myalgias with statins previously.  She is on Reyataz which can be elevated the cholesterol and triglycerides.  Her family history is also positive for hyperlipidemia.   Cholesterol reduction with diet and exercise was discussed as well as her risk for cardiovascular disease.  We will monitor her Lipid panel on the next visit.  Medications Added to Medication List This Visit: 1)  [facility]vs Naproxen Sodium 220 Mg Tabs (Naproxen sodium) .... Take 1 tablet by mouth two times a day 2)  Fish Oil Double Strength 1200 Mg Caps (Omega-3 fatty acids) .... Take 1 capsule by mouth two times a day 3)  Benazepril Hcl 40 Mg Tabs (Benazepril hcl) .... Take 1 tablet by mouth once a  day 4)  Gabapentin 100 Mg Caps (Gabapentin) .... Take 1 capsule by mouth four times a day per pcp 5)  Cyclobenzaprine Hcl 10 Mg Tabs (Cyclobenzaprine hcl) .... Take 1 tablet by mouth at bedtime  Complete Medication List: 1)  Reyataz 300 Mg Caps (Atazanavir sulfate) .... Take 1 capsule by mouth once a day 2)  Norvir 100 Mg Tabs (Ritonavir) .... One tab by mouth daily 3)  Truvada 200-300 Mg Tabs (Emtricitabine-tenofovir) .... One daily 4)  Amlodipine Besylate 2.5 Mg Tabs (Amlodipine besylate) .Marland Kitchen.. 1 tablet daily 5)  Premarin 0.625 Mg Tabs (Estrogens conjugated) .Marland Kitchen.. 1 tablet daily 6)  Cvs Naproxen Sodium 220 Mg Tabs (Naproxen sodium) .... Take 1 tablet by mouth two times a day 7)  Fish Oil Double Strength 1200 Mg Caps (Omega-3 fatty acids) .... Take 1 capsule by mouth two times a day 8)  Benazepril Hcl 40 Mg Tabs (Benazepril hcl) .... Take 1 tablet by mouth once a day 9)  Gabapentin 100 Mg Caps (Gabapentin) .... Take 1 capsule by mouth four times a day per pcp 10)  Cyclobenzaprine Hcl 10 Mg Tabs (Cyclobenzaprine hcl) .... Take 1 tablet by mouth at bedtime  Patient Instructions: 1)  Please schedule a follow-up appointment in 4-5 months. 2)  Be sure to return for lab work one (2) week before your next appointment as scheduled.  Process Orders Check Orders Results:     [facility]: ABN not required for this insurance Tests Sent for requisitioning (May 18, 2009 3:24 PM):     09/18/2009: [facility] -- T-CBC w/Diff [[id number]] (signed)     09/18/2009: [facility] -- T-Comprehensive Metabolic Panel [[id number]] (signed)     09/18/2009: [facility] -- T-HIV Viral Load [phone] (signed)

## 2010-02-27 NOTE — Progress Notes (Signed)
Summary: Vertigo, severe headaches, dizziness  Phone Note Call from Patient Call back at Home Phone [phone]   Caller: Patient Call For: Lina Sayre MD Reason for Call: Acute Illness, Talk to Nurse Complaint: Headache Details of Complaint: Dizziness, equilibrium problems Summary of Call: Pt. stated that since 01/11/2009 she has been having the above problems.  She has seen an ENT and no ear/hearing problems were found.  She was referred back to Dr. Maurice March by the ENT.   The ENT has referred her to a Neurologist.  She is wondering whether her HIV medications could be causing her these problems.  Her Norvir was changed from the refrigerated capsule to the non-refrigerated pill at ther last OV.  Pt. wanted Dr. Bonnetta Barry thoughts .  RN requested that when the pt. goes to the Neurologist that she sign a ROI for Dr. Bonnetta Barry OV notes.  RN sent Dr. Maurice March an email with this information and requesing that he complete the Dec. OV notes so that his assessment can be included when the clinic receives the ROI from the Neurologist.    Jennet Maduro RN  March 09, 2009 10:06 AM

## 2010-02-27 NOTE — Miscellaneous (Signed)
Summary: RW Update  Clinical Lists Changes  Observations: Added new observation of YEARAIDSPOS: 2010  (03/20/2009 13:52) Added new observation of HIV STATUS: CDC-defined AIDS  (03/20/2009 13:52)

## 2010-02-27 NOTE — Assessment & Plan Note (Signed)
Summary: 65month f/u[mkj]   CC:  4 month follow up.  History of Present Illness: [name] continues to do well and her headaches are under good control. Her ARV regimenis atazanavir, ritonavir and truvada and she is reponding well with HIV <48 copies and CD4 760. Other labs are WNL. Will rtc in 4 months for routine f/u.  Preventive Screening-Counseling & Management  Alcohol-Tobacco     Alcohol drinks/day: <1     Alcohol type: wine     Smoking Status: never  Caffeine-Diet-Exercise     Caffeine use/day: 0     Does Patient Exercise: yes     Type of exercise: walking 3 miles a day     Exercise (avg: min/session): >60     Times/week: 7  Safety-Violence-Falls     Seat Belt Use: yes   Current Allergies (reviewed today): ! PCN ! * AZITROMYCIN Vital Signs:  Patient profile:   60 year old female Menstrual status:  hysterectomy Height:      65 inches (165.10 cm) Weight:      155.12 pounds (70.51 kg) BMI:     25.91 Temp:     97.5 degrees F (36.39 degrees C) oral Pulse rate:   76 / minute BP sitting:   117 / 79  (left arm)  Vitals Entered By: Baxter Hire) (September 21, 2009 10:00 AM) CC: 4 month follow up Pain Assessment Patient in pain? no      Nutritional Status BMI of 25 - 29 = overweight Nutritional Status Detail appetite is good per patient  Have you ever been in a relationship where you felt threatened, hurt or afraid?No   Does patient need assistance? Functional Status Self care Ambulation Normal   Physical Exam  General:  alert and well-developed.  alert and well-developed.   Mouth:  good dentition and pharynx pink and moist.  good dentition and pharynx pink and moist.     Impression & Recommendations:  Problem # 1:  HIV DISEASE (ICD-042)  Orders: T-CD4SP ([facility]) (CD4SP) T-Comprehensive Metabolic Panel [phone]) T-HIV Viral Load ([id number]) Est. Patient Level IV (99214)Future Orders: T-CBC w/Diff ([id number]) ... 01/22/2010 Doing very  well.  Patient Instructions: 1)  Please schedule a follow-up appointment in 4 months. Schedule labs 2 weeks prior to visit.

## 2010-02-27 NOTE — Consult Note (Signed)
Summary: [facility]   Imported By: Florinda Marker 03/16/2009 14:13:30  _____________________________________________________________________  External Attachment:    Type:   Image     Comment:   External Document

## 2010-02-27 NOTE — Assessment & Plan Note (Signed)
Summary: flu shot   Vitals Entered By: Jennet Maduro RN (October 23, 2009 10:08 AM) Prior Medications: REYATAZ 300 MG CAPS (ATAZANAVIR SULFATE) Take 1 capsule by mouth once a day NORVIR 100 MG TABS (RITONAVIR) one tab by mouth daily TRUVADA 200-300 MG TABS (EMTRICITABINE-TENOFOVIR) one daily AMLODIPINE BESYLATE 2.5 MG TABS (AMLODIPINE BESYLATE) 1 tablet daily PREMARIN 0.625 MG TABS (ESTROGENS CONJUGATED) 1 tablet daily [facility] NAPROXEN SODIUM 220 MG TABS (NAPROXEN SODIUM) Take 1 tablet by mouth two times a day FISH OIL DOUBLE STRENGTH 1200 MG CAPS (OMEGA-3 FATTY ACIDS) Take 1 capsule by mouth two times a day BENAZEPRIL HCL 40 MG TABS (BENAZEPRIL HCL) Take 1 tablet by mouth once a day GABAPENTIN 100 MG CAPS (GABAPENTIN) Take 1 capsule by mouth four times a day per PCP CYCLOBENZAPRINE HCL 10 MG TABS (CYCLOBENZAPRINE HCL) Take 1 tablet by mouth at bedtime Current Allergies: ! PCN ! * AZITROMYCIN Immunization History:  Pneumovax Immunization History:    Pneumovax:  historical (11/22/2005)  Immunizations Administered:  Influenza Vaccine # 1:    Vaccine Type: Fluvax Non-MCR    Site: left deltoid    Mfr: novartis    Dose: 0.5 ml    Route: IM    Given by: Jennet Maduro RN    Exp. Date: 04/29/2010    Lot #: [id number]  Flu Vaccine Consent Questions:    Do you have a history of severe allergic reactions to this vaccine? no    Any prior history of allergic reactions to egg and/or gelatin? no    Do you have a sensitivity to the preservative Thimersol? no    Do you have a past history of Guillan-Barre Syndrome? no    Do you currently have an acute febrile illness? no    Have you ever had a severe reaction to latex? no    Vaccine information given and explained to patient? yes    Are you currently pregnant? no  Orders Added: 1)  Influenza Vaccine NON MCR [[id number]]

## 2010-02-27 NOTE — Miscellaneous (Signed)
Summary: Orders Update -labs  Clinical Lists Changes  Orders: Added new Test order of T-Lipid Profile [phone]) - Signed Added new Test order of T-CBC w/Diff [id number]) - Signed Added new Test order of T-CD4SP [facility]) (CD4SP) - Signed Added new Test order of T-Comprehensive Metabolic Panel [id number]) - Signed Added new Test order of T-HIV Viral Load [phone]) - Signed Added new Test order of T-RPR (Syphilis) ([id number]) - Signed     Process Orders Check Orders Results:     [facility]: ABN not required for this insurance Tests Sent for requisitioning (May 01, 2009 2:53 PM):     05/01/2009: [facility] -- T-Lipid Profile [phone] (signed)     05/01/2009: [facility] -- T-CBC w/Diff [[id number]] (signed)     05/01/2009: [facility] -- T-Comprehensive Metabolic Panel [[id number]] (signed)     05/01/2009: [facility] -- T-HIV Viral Load [phone] (signed)     05/01/2009: [facility] -- T-RPR (Syphilis) [phone] (signed)

## 2010-03-01 NOTE — Assessment & Plan Note (Signed)
Summary: F/U OV/VS   CC:  f/u.  History of Present Illness: [name] feels well andhair has regrown Her CD4 is >1000 and HIV undectable. Will continue present regimen and f/u 6 months.  Preventive Screening-Counseling & Management  Alcohol-Tobacco     Alcohol drinks/day: <1     Alcohol type: wine     Smoking Status: never      Sexual History:  n/a.        Drug Use:  never.     Current Allergies (reviewed today): ! PCN ! * AZITROMYCIN Vital Signs:  Patient profile:   60 year old female Menstrual status:  hysterectomy Height:      65 inches (165.10 cm) Weight:      156 pounds (70.91 kg) BMI:     26.05 Temp:     98.0 degrees F (36.67 degrees C) oral Pulse rate:   86 / minute BP sitting:   127 / 83  (left arm)  Vitals Entered By: Starleen Arms CMA (February 01, 2010 10:51 AM) CC: f/u Is Patient Diabetic? No Pain Assessment Patient in pain? no      Nutritional Status BMI of 25 - 29 = overweight Nutritional Status Detail nl  Does patient need assistance? Functional Status Self care Ambulation Normal   Physical Exam  General:  Well-developed,well-nourished,in no acute distress; alert,appropriate and cooperative throughout examination Head:  Normocephalic and atraumatic without obvious abnormalities. No apparent alopecia or balding. Mouth:  Oral mucosa and oropharynx without lesions or exudates.  Teeth in good repair. Skin:  Intact without suspicious lesions or rashes Cervical Nodes:  No lymphadenopathy noted Axillary Nodes:  No palpable lymphadenopathy   Impression & Recommendations:  Problem # 1:  HIV DISEASE (ICD-042)  HIv IS UNDER BEST CONTROL YET WITH CD4>1000/MM3 AND hiv UNDECTABLE.  Orders: Est. Patient Level IV (16109)  Patient Instructions: 1)  Please schedule a follow-up appointment in 5 months. 2)  Be sure to return for lab work one (2) week before your next appointment as scheduled.

## 2010-04-09 LAB — T-HELPER CELL (CD4) - (RCID CLINIC ONLY): CD4 T Cell Abs: 1040 uL (ref 400–2700)

## 2010-04-18 LAB — T-HELPER CELL (CD4) - (RCID CLINIC ONLY): CD4 % Helper T Cell: 37 % (ref 33–55)

## 2010-05-02 LAB — T-HELPER CELL (CD4) - (RCID CLINIC ONLY)
CD4 % Helper T Cell: 40 % (ref 33–55)
CD4 T Cell Abs: 880 uL (ref 400–2700)

## 2010-05-06 LAB — T-HELPER CELL (CD4) - (RCID CLINIC ONLY)
CD4 % Helper T Cell: 37 % (ref 33–55)
CD4 T Cell Abs: 720 uL (ref 400–2700)

## 2010-06-12 NOTE — H&P (Signed)
NAMEALYXANDRA, TENBRINK             ACCOUNT NO.:  [id number]   MEDICAL RECORD NO.:  [id number]          PATIENT TYPE:  EMS   LOCATION:  [facility]                         FACILITY:  [facility]   PHYSICIAN:  Mobolaji B. Bakare, M.D.DATE OF BIRTH:  07-Apr-1950   DATE OF ADMISSION:  03/21/2007  DATE OF DISCHARGE:                              HISTORY & PHYSICAL   PRIMARY CARE PHYSICIAN:  Dr. Doristine Counter with [facility].   CHIEF COMPLAINT:  Weakness and numbness lower extremities.   HISTORY OF PRESENT ILLNESS:  Ms. Langenfeld is a 60 year old Caucasian  female with known HIV positive since 68.  She has been follow sed by  Dr. Maurice March.  She is on antiretroviral treatment.  Her last CD4 count in  January 2009 was 870.  The patient presents today with weakness and  numbness in her lower extremities bilaterally which she said has been  progressive since she had epidural injection for chronic back pain on  March 06, 2007.  She has been able to walk but things got worse today  such that she found it difficult to lift her lower extremities off the  bed when she woke up this morning.  She was alarmed and decided to come  to the emergency room.  It should be noted that she was seen by Dr.  Doristine Counter this past Wednesday for similar symptoms.  She was evaluated  with blood work and the patient reported that this blood work was normal  (I do not have details of this).  Additionally, she describes loss of  taste and numbness around her mouth which has been ongoing since after  the epidural.  She dates all these symptoms to post epidural.  She also  noticed this morning to late afternoon that her speech was slurred.  Husband also confirmed this.  There has been some blurry vision but she  can read.  Husband describes a mental fog and confusion.  The patient is  able to give details of history at this time.  The only recent  medication is erythromycin which she started using after root canal as  week  ago.   She had severe headaches and associated numbness post epidural.  The  headache has since resolved.  She has been to Dr. Alvester Morin who did the  epidural and this was ascribed to the procedure and steroids used.  She  tells me she feels dizzy on getting up but no vertigo.   REVIEW OF SYSTEMS:  She denies fever, chills, cough, chest pain.  She  had some trouble breathing on exertion.  She does have history of asthma  in the past.  She has lost 6 pounds unintentionally.  Husband states  that her appetite has actually improved.  There is no dysuria, urgency,  abdominal pain, nausea, vomiting, diarrhea or constipation.   PAST MEDICAL HISTORY:  1. HIV positive since 1992.  2. Degenerative disk disease.  3. History of asthma.  4. History of Crohn's disease.  Recent evaluation by endoscopy was not      supportive of this diagnosis.   PAST SURGICAL HISTORY:  1. Cervical fusion.  2. Hysterectomy.   CURRENT MEDICATIONS:  1. Amlodipine/benazepril 2.5/10 once daily.  2. Hydrochlorothiazide 25 mg daily.  3. Premarin 0.625 mg daily.  4. Ultrase MT 20 one with meals.  5. Reyataz 300 mg once a day.  6. Truvada one tablet daily.  7. Norvir 100 mg daily.  8. Erythromycin 400 mg q.i.d. for 2 more days.   ALLERGIES:  PENICILLIN causes fever and rash.   FAMILY HISTORY:  Both parents are deceased.  Father passed away from old  age at 18.  Mother had liver cirrhosis.  Liver cirrhosis is somewhat  hereditary.  One aunt passed away from liver cirrhosis as well.  Neither  of them were alcoholic.   SOCIAL HISTORY:  She does not smoke or drink alcohol.  She is pretty  much active at home.  She is a Advertising copywriter.   PHYSICAL EXAMINATION:  VITAL SIGNS:  Temperature 97.7, blood pressure  121/85, pulse of 100-117, respiratory rate of 16, O2 saturation of 97%  on room air.  GENERAL:  The patient is awake, alert, oriented to time, place and  person.  HEENT:  Normocephalic, atraumatic head.  Pupils  equal, round and  reactive to light.  Mucous membranes no oral thrush.  No elevated JVD.  No carotid bruit.  LUNGS:  Clear clinically to auscultation.  CARDIOVASCULAR:  S1, S2.  Regular.  No murmur or gallop.  ABDOMEN:  Not distended, soft, nontender.  Bowel sounds present.  EXTREMITIES:  No pedal edema.  Pedal pulses palpable bilaterally.  CNS:  No focal neurological deficit.  Cranial nerves intact.  She has  paresthesia both lower extremities.  SPINE:  No tenderness along the vertebrae.  SKIN:  No rash, no petechiae.   INITIAL LABORATORY DATA:  Sed rate 5.  Cardiac markers of point of care  normal.  Urinalysis showed small leukocytes.  Urine glucose is 100.  Specific gravity 1.021.  Nitrites negative.  Microscopy 3-6.  Creatinine  0.9, BUN 25, sodium 120, potassium 3.3, chloride 91, glucose 100.  Bicarb 27.  White cell count 10.9.  Hemoglobin 17, hematocrit 50.  Platelets 312.  Absolute neutrophil count 11.6.  EKG shows normal sinus  rhythm.  No acute ST changes.  QTC interval normal.  Lumbosacral plain  films are unremarkable.  Chest x-ray no acute abnormality.  Head CT  normal.   ASSESSMENT AND PLAN:  Ms. Sessa is a 60 year old Caucasian female  with history of HIV positive and on antiretroviral medication who is  presenting with hyponatremia, lower extremity weakness and numbness but  has no objective weakness.  She appears dehydrated with tachycardia and  elevated BUN and hemoconcentration.  She will be admitted for further  evaluation.   ADMITTING DIAGNOSIS:  1. Hyponatremia, likely hypovolemic hyponatremia.  We checked      orthostatic blood pressure and heart rate and urine sodium, urine      osmolality and serum osmolality and checked TSH.  We thought      hydrochlorothiazide this may be contributing.  We will give if      fluids normal saline at 150 mL per hour and check BMET again in the      morning.  2. Peripheral neuropathy.  Will check vitamin B12, folate and  TSH.      This could be related to antiretroviral medications or HIV (human      immunodeficiency virus) disease itself.  We will rule out diabetes      mellitus.  Note that the patient has  glycosuria.  We will check      hemoglobin A1c and CBG in a.m., check phosphates and magnesium.  3. Weakness and dyspnea on exertion.  This is likely related to      dehydration and hyponatremia.  She does have history of asthma but      she does not wheeze at this point.  There is no focal weakness.      History is also supportive of acute stroke.  Head CT scan is      negative.  Will check D-dimer and will evaluate for pulmonary      embolism which is doubtful given her O2 sats on room air.  Would      correct underlying problems as noted above.  4. Hypertension.  I am holding hydrochlorothiazide in view of the      hyponatremia.  Will increase amlodipine to 5 mg daily and continue      benazepril at 10 mg daily.  5. Leukocytosis.  The etiology of this is unclear.  She does not have      a fever.  Chest x-ray is normal.  Urinalysis is unremarkable.  Will      send urine culture and repeat CBC again in the morning.  6. Degenerative disk disease/spinal stenosis status post epidural      shot.  No evidence to suggest epidural abscess.  Will continue with      pain management p.r.n.  7. Human immunodeficiency virus positive.  Most recent CD4 count is      870.  Will continue with antiretroviral medications.      Mobolaji B. Corky Downs, M.D.  Electronically Signed     MBB/MEDQ  D:  03/21/2007  T:  03/21/2007  Job:  [id number]   cc:   Marjory Lies, M.D.  Fransisco Hertz, M.D.

## 2010-06-15 NOTE — Discharge Summary (Signed)
[name], [name]             ACCOUNT NO.:  [id number]   MEDICAL RECORD NO.:  [id number]          PATIENT TYPE:  INP   LOCATION:  5509                         FACILITY:  [facility]   PHYSICIAN:  Mobolaji B. Bakare, M.D.DATE OF BIRTH:  [birth year]   DATE OF ADMISSION:  03/21/2007  DATE OF DISCHARGE:  03/23/2007                               DISCHARGE SUMMARY   PRIMARY CARE PHYSICIAN:  Dr. Doristine Counter at [facility].   FINAL DIAGNOSES:  1. Hyponatremia secondary to medication, hydrochlorothiazide      discontinued.  2. Peripheral neuropathy.  3. Mild spinal stenosis.  4. Weakness secondary to hyponatremia, improved.  5. Human immunodeficiency virus positive.  6. Recent root canal.  7. Dyslipidemia.   PROCEDURE:  1. Chest x-ray done on March 25, 2007 showed no acute      cardiopulmonary disease.  2. X-ray of the lumbar spine was unremarkable with the note that prior      MRI demonstrated multilevel degenerative disk disease with disk      protrusion.  3. Head CT scan showed no acute findings.   BRIEF HISTORY:  Please refer to the admission H&P dictated by Dr. Corky Downs  In brief, [name] is a 59 year old Caucasian female with HIV  disease.  She has been on antiretrovirals.  The patient presented to the  emergency room with weakness and numbness in her lower extremities.  She  hyponatremia with sodium of 120.  The patient was admitted for further  evaluation.  She has been under the care of Dr. Cleophas Dunker regarding back  pain and lumbar degenerative disk disease.  She received a steroid  shot/epidural injection on the March 06, 2007, about 2 weeks prior to  admission.  The patient had no pain, swelling or evidence of cellulitis  in the area of steroid shot.   HOSPITAL COURSE:  PROBLEM #1 - HYPONATREMIA:  This was felt to be  secondary to hydrochlorothiazide.  The hydrochlorothiazide was  discontinued.  The patient was given gentle hydration; sodium improved  quickly and the time of discharge, sodium was 134.   PROBLEM #2 - PERIPHERAL NEUROPATHY:  The patient clearly had numbness  and feeling of weakness in the lower extremities.  There was no loss of  muscle tone; all muscle power and reflexes in both lower extremities  were symmetrical.  These symptoms improved with correction of  hyponatremia.  It is also noted that the patient is on antiretroviral  medications which could possibly cause peripheral neuropathy.  I will  defer further treatment to Dr. Doristine Counter.  These symptoms anyway improved  prior to discharge.   PROBLEM #3 - RECENT ROOT CANAL TREATMENT:  The patient was on  clindamycin prior to hospitalization; this was continued.  The patient  was given erythromycin after the root canal because of allergy to  penicillin; however, when this was continued during the course of  hospitalization, she complained of some intolerance, like giving a bad  taste in the mouth; hence, this was changed to clindamycin.   PROBLEM #4 - HIV-POSITIVE:  She was continued on antiretroviral  medications during the course of  hospitalization.   PROBLEM #5 - LEUKOCYTOSIS:  The patient was admitted with a white cell  count of 10.9; this was felt to be mildly reactive.  Chest x-ray and  urinalysis were unremarkable.   DISCHARGE CONDITION:  Stable.   VITALS AT THE TIME OF DISCHARGE:  Temperature 97, pulse 83, blood  pressure 118/78, O2 SATs 95%.   DISCHARGE MEDICATIONS:  1. Amlodipine/benazepril 2.5/10 mg one daily.  2. Premarin 0.625 daily.  3. Ultrase MT 20 daily.  4. Reyataz 300 mg once daily.  5. Truvada 1 tablet daily.  6. Norvir 100 mg daily.  7. Clindamycin 300 mg four times a day.  8. Multivitamin once daily.      Mobolaji B. Corky Downs, M.D.  Electronically Signed     MBB/MEDQ  D:  05/06/2007  T:  05/07/2007  Job:  [id number]   cc:   Claude Manges. Cleophas Dunker, M.D.  Marjory Lies, M.D.

## 2010-06-15 NOTE — Op Note (Signed)
**Note [name] via Obfuscation** [name], [name]             ACCOUNT NO.:  [id number]   MEDICAL RECORD NO.:  [id number]          PATIENT TYPE:  OIB   LOCATION:  2899                         FACILITY:  [facility]   PHYSICIAN:  Hewitt Shorts, M.D.DATE OF BIRTH:  08-Feb-1950   DATE OF PROCEDURE:  03/29/2004  DATE OF DISCHARGE:                                 OPERATIVE REPORT   PREOPERATIVE DIAGNOSIS:  Bilateral carpal tunnel syndrome.   POSTOPERATIVE DIAGNOSIS:  Bilateral carpal tunnel syndrome.   PROCEDURE:  Right carpal tunnel release.   SURGEON:  Hewitt Shorts, M.D.   ANESTHESIA:  Bier block, with intravenous sedation.   INDICATIONS:  The patient is a 60 year old woman with bilateral carpal  tunnel syndrome.  The patient is brought to surgery with right carpal tunnel  release, with planned left carpal tunnel release in about a month or so.   PROCEDURE:  The patient was brought to the operating room.  A Bier block was  administered by the anesthesia service of the distal right upper extremity.  The distal right upper extremity was prepped with Betadine soap and solution  and draped in a sterile fashion.  A 3-4 cm in length curvilinear incision  was made in the proximal right palm extending from the distal carpal crease  just medial to the thenar crease.  The patient had good anesthesia from the  Bier block, and no supplemental local anesthetic was needed.  Dissection was  carried down through the subcutaneous tissue to the transverse carpal  ligament, which was carefully opened from its distal to proximal extent,  decompressing the underlying structures including the median nerve.  The  ligament was fully opened in its proximal to distal extent, and once this  was completed we proceeded with closure.  The subcuticular layer was closed  with interrupted inverted 2-0 Vicryl sutures.  The skin was closed with  interrupted horizontal mattress sutures with 4-0 nylon.  The wound was  dressed with Adaptic,  sterile gauze, fluffs, and then wrapped with a Kling.  Once the [name] was applied, the tourniquet was released, and we instructed  the patient to keep her right upper extremity and particularly her right  hand elevated, and she was provided with a large soft sling to assist her  with that.  She was also instructed in gentle exercises to perform for the  digits of her right hand, and she was given a prescription for Vicodin  1 tablet q.4-6 h. p.r.n. pain.  We prescribed 25 tablets, no refills.  She  was also advised if she needed milder medications to use Tylenol, Advil, or  Aleve.  The patient was subsequently transferred to the recovery room to be  discharged to home when stable, and to return to the office tomorrow for  dressing change.      RWN/MEDQ  D:  03/29/2004  T:  03/29/2004  Job:  [id number]   cc:   Hewitt Shorts, M.D.  [address]  [place]  [place] [address]  Fax: [phone]   Fransisco Hertz, M.D.  [address]  [place] [address]  Fax:  [phone] 

## 2010-06-15 NOTE — Op Note (Signed)
NAMEMORA, PEDRAZA             ACCOUNT NO.:  [id number]   MEDICAL RECORD NO.:  [id number]          PATIENT TYPE:  OIB   LOCATION:  2899                         FACILITY:  [facility]   PHYSICIAN:  Hewitt Shorts, M.D.DATE OF BIRTH:  03/25/1950   DATE OF PROCEDURE:  04/30/2004  DATE OF DISCHARGE:                                 OPERATIVE REPORT   PREOPERATIVE DIAGNOSIS:  Bilateral carpal tunnel syndrome.   POSTOPERATIVE DIAGNOSIS:  Bilateral carpal tunnel syndrome.   OPERATION PERFORMED:  Left carpal tunnel release.   SURGEON:  Hewitt Shorts, M.D.   ANESTHESIA:  Bier block.   INDICATIONS FOR PROCEDURE:  The patient is a 60 year old woman who presented  with bilateral carpal tunnel syndrome.  She is a little over a month status  post a right carpal tunnel release.  She has done well from that.  She  returns for a left carpal tunnel release.   DESCRIPTION OF PROCEDURE:  The patient was brought to the operating room.  A  left upper extremity Bier block was applied by the anesthesia service.  Then  the left upper extremity was prepped with Betadine soap and solution and  draped in sterile fashion.  A proximal left palmar incision was made  paralleling the thenar crease, extending proximally to nearly the distal  carpal crease.  Dissection was carried down to the subcutaneous tissue to  the transverse carpal ligament which was carefully divided from distal to  proximal with care taken to leave underlying structures undisturbed.  There  was marked thickening and tightness in the transverse carpal ligament and it  was fully divided in its proximal and distal extent with good decompression  of the underlying structures.  Once it was fully decompressed, we  approximated the subcutaneous layer with two inverted interrupted 2-0 undyed  Vicryl sutures.  The skin was reapproximated with interrupted 3-0 nylon  horizontal mattress sutures.  The wound was dressed with adaptic and gauze  fluffs and wrapped with a Kling and then the tourniquet was released.  The  patient tolerated the procedure well.   ESTIMATED BLOOD LOSS:  Nil.   SPONGE AND NEEDLE COUNT:  Correct.   Following surgery, the patient was transferred to the recovery room for  further care where she was moving all four extremities and was awake and  alert.    RWN/MEDQ  D:  04/30/2004  T:  04/30/2004  Job:  [id number]

## 2010-06-21 ENCOUNTER — Other Ambulatory Visit (INDEPENDENT_AMBULATORY_CARE_PROVIDER_SITE_OTHER): Payer: BC Managed Care – PPO | Admitting: Infectious Diseases

## 2010-06-21 ENCOUNTER — Other Ambulatory Visit: Payer: Self-pay

## 2010-06-21 ENCOUNTER — Other Ambulatory Visit: Payer: Self-pay | Admitting: Infectious Diseases

## 2010-06-21 DIAGNOSIS — B20 Human immunodeficiency virus [HIV] disease: Secondary | ICD-10-CM

## 2010-06-21 LAB — COMPREHENSIVE METABOLIC PANEL
AST: 22 U/L (ref 0–37)
Alkaline Phosphatase: 78 U/L (ref 39–117)
BUN: 14 mg/dL (ref 6–23)
Glucose, Bld: 82 mg/dL (ref 70–99)
Sodium: 140 mEq/L (ref 135–145)
Total Bilirubin: 2.5 mg/dL — ABNORMAL HIGH (ref 0.3–1.2)

## 2010-06-22 LAB — T-HELPER CELL (CD4) - (RCID CLINIC ONLY): CD4 T Cell Abs: 790 uL (ref 400–2700)

## 2010-06-22 LAB — CBC WITH DIFFERENTIAL/PLATELET
Basophils Absolute: 0.1 10*3/uL (ref 0.0–0.1)
Basophils Relative: 1 % (ref 0–1)
Eosinophils Absolute: 0.1 10*3/uL (ref 0.0–0.7)
Hemoglobin: 14.2 g/dL (ref 12.0–15.0)
MCH: 32.9 pg (ref 26.0–34.0)
MCHC: 34.6 g/dL (ref 30.0–36.0)
Monocytes Relative: 7 % (ref 3–12)
Neutro Abs: 3.4 10*3/uL (ref 1.7–7.7)
Neutrophils Relative %: 55 % (ref 43–77)
RDW: 13.1 % (ref 11.5–15.5)

## 2010-06-22 LAB — HIV-1 RNA QUANT-NO REFLEX-BLD: HIV-1 RNA Quant, Log: <1.3 {Log} (ref <1.30)

## 2010-07-05 ENCOUNTER — Ambulatory Visit (INDEPENDENT_AMBULATORY_CARE_PROVIDER_SITE_OTHER): Payer: BC Managed Care – PPO | Admitting: Infectious Diseases

## 2010-07-05 ENCOUNTER — Encounter: Payer: Self-pay | Admitting: Infectious Diseases

## 2010-07-05 VITALS — BP 139/88 | HR 88 | Temp 97.9°F | Ht 63.0 in | Wt 156.0 lb

## 2010-07-05 DIAGNOSIS — Z79899 Other long term (current) drug therapy: Secondary | ICD-10-CM

## 2010-07-05 DIAGNOSIS — B20 Human immunodeficiency virus [HIV] disease: Secondary | ICD-10-CM

## 2010-07-05 DIAGNOSIS — Z113 Encounter for screening for infections with a predominantly sexual mode of transmission: Secondary | ICD-10-CM

## 2010-07-05 DIAGNOSIS — Z21 Asymptomatic human immunodeficiency virus [HIV] infection status: Secondary | ICD-10-CM

## 2010-07-05 NOTE — Progress Notes (Signed)
  Subjective:    Patient ID: [name], female    DOB: 1950-12-23, 60 y.o.   MRN: [id number]  HPIWilma is here for routine HIV f/u visit. She has no complaints and labs show suppression of HIV and  relatively stable CD4.  Review of Systems  Constitutional: Negative.   HENT: Negative.   Eyes: Negative.   Respiratory: Negative.   Cardiovascular: Negative.   Gastrointestinal: Negative.        Objective:   Physical Exam  Constitutional: She appears well-developed.  Eyes: No scleral icterus.  Neck: Normal range of motion.  Cardiovascular: Normal rate, regular rhythm and normal heart sounds.   Lymphadenopathy:    She has no cervical adenopathy.         Assessment & Plan:  Clincially stable and continue current ARV regimen of atazanvir, norvir, and Truvada. RTC 5 months

## 2010-10-22 LAB — URINALYSIS, ROUTINE W REFLEX MICROSCOPIC
Glucose, UA: 100 — AB
Hgb urine dipstick: NEGATIVE
Protein, ur: NEGATIVE
Specific Gravity, Urine: 1.021
pH: 7

## 2010-10-22 LAB — COMPREHENSIVE METABOLIC PANEL
Alkaline Phosphatase: 36 — ABNORMAL LOW
BUN: 11
Chloride: 104
Glucose, Bld: 106 — ABNORMAL HIGH
Potassium: 3.9
Total Bilirubin: 1.8 — ABNORMAL HIGH
Total Protein: 5.1 — ABNORMAL LOW

## 2010-10-22 LAB — PHOSPHORUS: Phosphorus: 3

## 2010-10-22 LAB — DIFFERENTIAL
Eosinophils Absolute: 0
Lymphs Abs: 1.2
Monocytes Absolute: 1.1 — ABNORMAL HIGH
Monocytes Relative: 8
Neutro Abs: 11.6 — ABNORMAL HIGH
Neutrophils Relative %: 83 — ABNORMAL HIGH

## 2010-10-22 LAB — CBC
HCT: 37.2
Hemoglobin: 12.7
Hemoglobin: 13.3
Hemoglobin: 17 — ABNORMAL HIGH
MCHC: 34.2
MCHC: 34.3
MCV: 94.7
MCV: 95.3
Platelets: 240
RBC: 5.23 — ABNORMAL HIGH
RDW: 11.9
RDW: 12.2
WBC: 13.9 — ABNORMAL HIGH

## 2010-10-22 LAB — I-STAT 8, (EC8 V) (CONVERTED LAB)
Acid-Base Excess: 2
Bicarbonate: 27.4 — ABNORMAL HIGH
HCT: 51 — ABNORMAL HIGH
Hemoglobin: 17.3 — ABNORMAL HIGH
Operator id: 257131
Sodium: 120 — ABNORMAL LOW
TCO2: 29
pCO2, Ven: 45.8

## 2010-10-22 LAB — URINE MICROSCOPIC-ADD ON

## 2010-10-22 LAB — POCT CARDIAC MARKERS
Myoglobin, poc: 65.7
Operator id: 257131
Troponin i, poc: <0.05

## 2010-10-22 LAB — URINE CULTURE: Culture: NO GROWTH

## 2010-10-22 LAB — BASIC METABOLIC PANEL
BUN: 13
CO2: 26
Calcium: 8.3 — ABNORMAL LOW
Creatinine, Ser: 0.67
Glucose, Bld: 112 — ABNORMAL HIGH
Sodium: 134 — ABNORMAL LOW

## 2010-10-22 LAB — LIPID PANEL
LDL Cholesterol: 183 — ABNORMAL HIGH
VLDL: 25

## 2010-10-22 LAB — HEMOGLOBIN A1C
Hgb A1c MFr Bld: 5.7
Mean Plasma Glucose: 126

## 2010-10-22 LAB — FOLATE: Folate: 16.4

## 2010-10-22 LAB — MAGNESIUM: Magnesium: 2.6 — ABNORMAL HIGH

## 2010-10-25 LAB — T-HELPER CELL (CD4) - (RCID CLINIC ONLY)
CD4 % Helper T Cell: 35
CD4 T Cell Abs: 770

## 2010-11-07 LAB — T-HELPER CELL (CD4) - (RCID CLINIC ONLY)
CD4 % Helper T Cell: 38
CD4 T Cell Abs: 940

## 2010-12-07 ENCOUNTER — Other Ambulatory Visit: Payer: Self-pay | Admitting: Infectious Diseases

## 2010-12-26 ENCOUNTER — Other Ambulatory Visit: Payer: Self-pay | Admitting: Infectious Diseases

## 2010-12-26 ENCOUNTER — Other Ambulatory Visit: Payer: BC Managed Care – PPO

## 2010-12-26 DIAGNOSIS — Z79899 Other long term (current) drug therapy: Secondary | ICD-10-CM

## 2010-12-26 DIAGNOSIS — B20 Human immunodeficiency virus [HIV] disease: Secondary | ICD-10-CM

## 2010-12-26 DIAGNOSIS — Z113 Encounter for screening for infections with a predominantly sexual mode of transmission: Secondary | ICD-10-CM

## 2010-12-26 LAB — CBC WITH DIFFERENTIAL/PLATELET
Eosinophils Absolute: 0.2 10*3/uL (ref 0.0–0.7)
Eosinophils Relative: 3 % (ref 0–5)
HCT: 42.2 % (ref 36.0–46.0)
Lymphocytes Relative: 42 % (ref 12–46)
Lymphs Abs: 2.6 10*3/uL (ref 0.7–4.0)
MCH: 32 pg (ref 26.0–34.0)
MCV: 93.8 fL (ref 78.0–100.0)
Monocytes Absolute: 0.5 10*3/uL (ref 0.1–1.0)
Platelets: 325 10*3/uL (ref 150–400)
RBC: 4.5 MIL/uL (ref 3.87–5.11)
RDW: 12.1 % (ref 11.5–15.5)

## 2010-12-26 LAB — COMPREHENSIVE METABOLIC PANEL
ALT: 19 U/L (ref 0–35)
Alkaline Phosphatase: 77 U/L (ref 39–117)
Creat: 0.9 mg/dL (ref 0.50–1.10)
Sodium: 139 mEq/L (ref 135–145)
Total Bilirubin: 2.4 mg/dL — ABNORMAL HIGH (ref 0.3–1.2)
Total Protein: 7.5 g/dL (ref 6.0–8.3)

## 2010-12-26 LAB — LIPID PANEL
HDL: 63 mg/dL (ref >39)
LDL Cholesterol: 206 mg/dL — ABNORMAL HIGH (ref 0–99)
Total CHOL/HDL Ratio: 4.8 Ratio

## 2010-12-26 LAB — RPR

## 2010-12-27 LAB — T-HELPER CELL (CD4) - (RCID CLINIC ONLY): CD4 T Cell Abs: 1050 uL (ref 400–2700)

## 2010-12-31 LAB — HIV-1 RNA QUANT-NO REFLEX-BLD: HIV-1 RNA Quant, Log: <1.3 {Log} (ref <1.30)

## 2011-01-03 ENCOUNTER — Other Ambulatory Visit: Payer: BC Managed Care – PPO

## 2011-01-17 ENCOUNTER — Encounter: Payer: Self-pay | Admitting: Infectious Diseases

## 2011-01-17 ENCOUNTER — Ambulatory Visit (INDEPENDENT_AMBULATORY_CARE_PROVIDER_SITE_OTHER): Payer: BC Managed Care – PPO | Admitting: Infectious Diseases

## 2011-01-17 VITALS — BP 138/84 | HR 77 | Temp 97.6°F | Ht 65.0 in | Wt 156.8 lb

## 2011-01-17 DIAGNOSIS — Z23 Encounter for immunization: Secondary | ICD-10-CM

## 2011-01-17 DIAGNOSIS — B20 Human immunodeficiency virus [HIV] disease: Secondary | ICD-10-CM

## 2011-01-17 NOTE — Progress Notes (Signed)
Subjective:     Patient ID: [name], female   DOB: 1950/07/05, 60 y.o.   MRN: [id number]  HPIWilma continues to do well and tolerated her ARV regimen of atazanvir, ritonavir and truvada. Her drug related hyperbilirubinemia is stable. She has no new complaints and CD4 is >1000 and HIV undectable. Lipids are elevated but Porter refuses medications because of her neuropathy. Will follow up in 4 months.   Review of Systems  Constitutional: Negative.   HENT: Negative.   Respiratory: Negative.   Cardiovascular: Negative.   Gastrointestinal: Negative.   Genitourinary: Negative.   Musculoskeletal: Negative.   Neurological: Negative.        Objective:   Physical Exam     Assessment:     Clinically stable.    Plan:   Continue current ARVs regimen and will f/u in 4-5 months.

## 2011-04-15 ENCOUNTER — Other Ambulatory Visit: Payer: Self-pay | Admitting: Licensed Clinical Social Worker

## 2011-04-15 DIAGNOSIS — B20 Human immunodeficiency virus [HIV] disease: Secondary | ICD-10-CM

## 2011-05-09 ENCOUNTER — Other Ambulatory Visit: Payer: BC Managed Care – PPO

## 2011-05-09 DIAGNOSIS — B20 Human immunodeficiency virus [HIV] disease: Secondary | ICD-10-CM

## 2011-05-09 LAB — CBC WITH DIFFERENTIAL/PLATELET
Basophils Absolute: 0 10*3/uL (ref 0.0–0.1)
Eosinophils Relative: 5 % (ref 0–5)
HCT: 42.1 % (ref 36.0–46.0)
Hemoglobin: 14.1 g/dL (ref 12.0–15.0)
Lymphocytes Relative: 40 % (ref 12–46)
Lymphs Abs: 2.3 10*3/uL (ref 0.7–4.0)
MCV: 95.7 fL (ref 78.0–100.0)
Monocytes Absolute: 0.4 10*3/uL (ref 0.1–1.0)
Monocytes Relative: 7 % (ref 3–12)
Neutro Abs: 2.7 10*3/uL (ref 1.7–7.7)
RBC: 4.4 MIL/uL (ref 3.87–5.11)
RDW: 13 % (ref 11.5–15.5)
WBC: 5.6 10*3/uL (ref 4.0–10.5)

## 2011-05-09 LAB — COMPLETE METABOLIC PANEL WITH GFR
AST: 25 U/L (ref 0–37)
Alkaline Phosphatase: 68 U/L (ref 39–117)
BUN: 15 mg/dL (ref 6–23)
Creat: 0.82 mg/dL (ref 0.50–1.10)
GFR, Est Non African American: 79 mL/min
Glucose, Bld: 79 mg/dL (ref 70–99)
Potassium: 4.6 mEq/L (ref 3.5–5.3)
Total Bilirubin: 2 mg/dL — ABNORMAL HIGH (ref 0.3–1.2)

## 2011-05-10 LAB — T-HELPER CELL (CD4) - (RCID CLINIC ONLY)
CD4 % Helper T Cell: 37 % (ref 33–55)
CD4 T Cell Abs: 820 uL (ref 400–2700)

## 2011-05-13 LAB — HIV-1 RNA QUANT-NO REFLEX-BLD: HIV-1 RNA Quant, Log: <1.3 {Log} (ref <1.30)

## 2011-05-23 ENCOUNTER — Encounter: Payer: Self-pay | Admitting: Infectious Diseases

## 2011-05-23 ENCOUNTER — Ambulatory Visit (INDEPENDENT_AMBULATORY_CARE_PROVIDER_SITE_OTHER): Payer: BC Managed Care – PPO | Admitting: Infectious Diseases

## 2011-05-23 ENCOUNTER — Ambulatory Visit: Payer: BC Managed Care – PPO | Admitting: Infectious Diseases

## 2011-05-23 VITALS — BP 136/84 | HR 85 | Temp 97.7°F | Ht 65.0 in | Wt 155.2 lb

## 2011-05-23 DIAGNOSIS — B20 Human immunodeficiency virus [HIV] disease: Secondary | ICD-10-CM

## 2011-05-23 NOTE — Progress Notes (Signed)
Patient ID: [name], female   DOB: 10-30-1950, 61 y.o.   MRN: [id number] HIV folloup visit for Sussan. She will be 61 years old in a few months and only notable complaint is more joint stiffness with use which is related to osteoarthritis most likely. Hips and knees are not involved.She has no weight loss, fevers or diarrhea and he Crohn disease is quiescent. Exam BP 136/84  Pulse 85  Temp(Src) 97.7 F (36.5 C) (Oral)  Ht 5\' 5"  (1.651 m)  Wt 155 lb 4 oz (70.421 kg)  BMI 25.83 kg/m2 No rashes or adenopathy. Mild OSA changes in PIPs and MIPs of her hands. Lab. HIV suppressed for over 2 years of consecutive visits. Renal function is normal Assessment HIV suppressed and will continue atazanavir, ritonavir, and Truvada. F/u with me in 5 months. Lina Sayre

## 2011-08-30 ENCOUNTER — Other Ambulatory Visit: Payer: Self-pay | Admitting: Infectious Diseases

## 2011-10-08 ENCOUNTER — Other Ambulatory Visit: Payer: Self-pay | Admitting: Infectious Diseases

## 2011-10-08 DIAGNOSIS — Z113 Encounter for screening for infections with a predominantly sexual mode of transmission: Secondary | ICD-10-CM

## 2011-10-08 DIAGNOSIS — Z79899 Other long term (current) drug therapy: Secondary | ICD-10-CM

## 2011-10-10 ENCOUNTER — Other Ambulatory Visit: Payer: BC Managed Care – PPO

## 2011-10-10 DIAGNOSIS — Z79899 Other long term (current) drug therapy: Secondary | ICD-10-CM

## 2011-10-10 DIAGNOSIS — B20 Human immunodeficiency virus [HIV] disease: Secondary | ICD-10-CM

## 2011-10-10 DIAGNOSIS — Z113 Encounter for screening for infections with a predominantly sexual mode of transmission: Secondary | ICD-10-CM

## 2011-10-10 LAB — CBC WITH DIFFERENTIAL/PLATELET
Basophils Absolute: 0 10*3/uL (ref 0.0–0.1)
Basophils Relative: 1 % (ref 0–1)
MCHC: 35.1 g/dL (ref 30.0–36.0)
Neutro Abs: 3.3 10*3/uL (ref 1.7–7.7)
Neutrophils Relative %: 51 % (ref 43–77)
RDW: 12.4 % (ref 11.5–15.5)
WBC: 6.3 10*3/uL (ref 4.0–10.5)

## 2011-10-10 LAB — COMPREHENSIVE METABOLIC PANEL
ALT: 17 U/L (ref 0–35)
AST: 23 U/L (ref 0–37)
Albumin: 4.4 g/dL (ref 3.5–5.2)
BUN: 16 mg/dL (ref 6–23)
CO2: 28 mEq/L (ref 19–32)
Calcium: 9.6 mg/dL (ref 8.4–10.5)
Chloride: 101 mEq/L (ref 96–112)
Potassium: 4.6 mEq/L (ref 3.5–5.3)

## 2011-10-10 LAB — LIPID PANEL
Cholesterol: 272 mg/dL — ABNORMAL HIGH (ref 0–200)
LDL Cholesterol: 173 mg/dL — ABNORMAL HIGH (ref 0–99)
VLDL: 37 mg/dL (ref 0–40)

## 2011-10-13 LAB — HIV-1 RNA QUANT-NO REFLEX-BLD
HIV 1 RNA Quant: <20 copies/mL (ref <20)
HIV-1 RNA Quant, Log: <1.3 {Log} (ref <1.30)

## 2011-10-25 ENCOUNTER — Ambulatory Visit: Payer: BC Managed Care – PPO | Admitting: Infectious Diseases

## 2011-10-25 ENCOUNTER — Encounter: Payer: Self-pay | Admitting: Infectious Diseases

## 2011-11-03 ENCOUNTER — Encounter (HOSPITAL_COMMUNITY): Payer: Self-pay | Admitting: *Deleted

## 2011-11-03 ENCOUNTER — Emergency Department (HOSPITAL_COMMUNITY): Payer: BC Managed Care – PPO

## 2011-11-03 ENCOUNTER — Emergency Department (HOSPITAL_COMMUNITY)
Admission: EM | Admit: 2011-11-03 | Discharge: 2011-11-03 | Disposition: A | Discharge status: Home / Self Care | Payer: BC Managed Care – PPO | Attending: Emergency Medicine | Admitting: Emergency Medicine

## 2011-11-03 DIAGNOSIS — Y93H2 Activity, gardening and landscaping: Secondary | ICD-10-CM | POA: Insufficient documentation

## 2011-11-03 DIAGNOSIS — S68119A Complete traumatic metacarpophalangeal amputation of unspecified finger, initial encounter: Secondary | ICD-10-CM

## 2011-11-03 DIAGNOSIS — Y998 Other external cause status: Secondary | ICD-10-CM | POA: Insufficient documentation

## 2011-11-03 DIAGNOSIS — W298XXA Contact with other powered powered hand tools and household machinery, initial encounter: Secondary | ICD-10-CM | POA: Insufficient documentation

## 2011-11-03 DIAGNOSIS — IMO0002 Reserved for concepts with insufficient information to code with codable children: Secondary | ICD-10-CM | POA: Insufficient documentation

## 2011-11-03 DIAGNOSIS — Z21 Asymptomatic human immunodeficiency virus [HIV] infection status: Secondary | ICD-10-CM | POA: Insufficient documentation

## 2011-11-03 DIAGNOSIS — I10 Essential (primary) hypertension: Secondary | ICD-10-CM | POA: Insufficient documentation

## 2011-11-03 HISTORY — DX: Human immunodeficiency virus (HIV) disease: B20

## 2011-11-03 HISTORY — DX: Migraine, unspecified, not intractable, without status migrainosus: G43.909

## 2011-11-03 HISTORY — DX: Essential (primary) hypertension: I10

## 2011-11-03 HISTORY — DX: Asymptomatic human immunodeficiency virus (hiv) infection status: Z21

## 2011-11-03 MED ORDER — TETANUS-DIPHTH-ACELL PERTUSSIS 5-2.5-18.5 LF-MCG/0.5 IM SUSP
0.5000 mL | Freq: Once | INTRAMUSCULAR | Status: AC
  Administered 2011-11-03: 0.5 mL via INTRAMUSCULAR
  Filled 2011-11-03: qty 0.5

## 2011-11-03 MED ORDER — OXYCODONE-ACETAMINOPHEN 5-325 MG PO TABS: 2.0000 | ORAL_TABLET | ORAL | Status: DC | PRN

## 2011-11-03 MED ORDER — CEPHALEXIN 500 MG PO CAPS: ORAL_CAPSULE | ORAL | Status: DC

## 2011-11-03 NOTE — ED Notes (Signed)
Pt in c/o tip of right middle finger being cut off, states she was trimming flowers and caught her finger, bleeding controlled, pt with tip of finger with her.

## 2011-11-03 NOTE — ED Provider Notes (Signed)
History  This chart was scribed for Hurman Horn, MD by Erskine Emery. This patient was seen in room TR07C/TR07C and the patient's care was started at 12:17.  CSN: [id number]  Arrival date & time 11/03/11  1038   None     Chief Complaint  Patient presents with  . Finger Injury    (Consider location/radiation/quality/duration/timing/severity/associated sxs/prior treatment) The history is provided by the patient. No language interpreter was used.  [name] is a 61 y.o. female who presents to the Emergency Department complaining of a right middle fingertip amputation distal to DIP since trimming flowers this morning. Bleeding is now controlled. Pt denies any associated symptoms. Pt has no medical conditions other than a h/o HIV and HTN. Pt doesn't remember when her last tetanus shot was.No weak/numb of remaining finger.  Past Medical History  Diagnosis Date  . HIV (human immunodeficiency virus infection)   . Hypertension   . Migraine     History reviewed. No pertinent past surgical history.  History reviewed. No pertinent family history.  History  Substance Use Topics  . Smoking status: Never Smoker   . Smokeless tobacco: Never Used  . Alcohol Use: 0.5 oz/week    1 drink(s) per week    OB History    Grav Para Term Preterm Abortions TAB SAB Ect Mult Living                  Review of Systems 10 Systems reviewed and are negative for acute change except as noted in the HPI.   Allergies  Azithromycin  Home Medications   Current Outpatient Rx  Name Route Sig Dispense Refill  . AMLODIPINE BESYLATE 2.5 MG PO TABS Oral Take 2.5 mg by mouth daily.      . ATAZANAVIR SULFATE 300 MG PO CAPS Oral Take 300 mg by mouth daily with breakfast.    . BENAZEPRIL HCL 40 MG PO TABS Oral Take 40 mg by mouth daily.      . CYCLOBENZAPRINE HCL 10 MG PO TABS Oral Take 10 mg by mouth at bedtime.      Marland Kitchen EMTRICITABINE-TENOFOVIR 200-300 MG PO TABS Oral Take 1 tablet by mouth daily.      Marland Kitchen ESTROGENS CONJUGATED 0.625 MG PO TABS Oral Take 0.625 mg by mouth daily. Take daily for 21 days then do not take for 7 days.     Marland Kitchen GABAPENTIN 100 MG PO TABS Oral Take 100 mg by mouth 3 (three) times daily.     Marland Kitchen NAPROXEN SODIUM 220 MG PO TABS Oral Take 440 mg by mouth 2 (two) times daily.     Marland Kitchen FISH OIL 1200 MG PO CAPS Oral Take 1,200 mg by mouth 2 (two) times daily.      Marland Kitchen RITONAVIR 100 MG PO CAPS Oral Take 100 mg by mouth daily.    . CEPHALEXIN 500 MG PO CAPS  2 caps po bid x 7 days 28 capsule 0  . OXYCODONE-ACETAMINOPHEN 5-325 MG PO TABS Oral Take 2 tablets by mouth every 4 (four) hours as needed for pain. 20 tablet 0    BP 121/79  Pulse 107  Temp 98.2 F (36.8 C) (Oral)  Resp 16  SpO2 100%  Physical Exam  Nursing note and vitals reviewed. Constitutional:       Awake, alert, nontoxic appearance.  HENT:  Head: Atraumatic.  Eyes: Right eye exhibits no discharge. Left eye exhibits no discharge.  Neck: Neck supple.  Pulmonary/Chest: Effort normal. She exhibits no tenderness.  Abdominal: Soft. There is no tenderness. There is no rebound.  Musculoskeletal: She exhibits no tenderness.       Right long finger: distal to DIP tip amputation, halfway across the nailbed. The remainder of the hand is non tender. CR less than 2 seconds. Good color. 5/5 extension in FDS and FDP against resistance. 2 point discrimination intact.  Neurological:       Mental status and motor strength appears baseline for patient and situation.  Skin: No rash noted.  Psychiatric: She has a normal mood and affect.    ED Course  Procedures (including critical care time) DIAGNOSTIC STUDIES: Oxygen Saturation is 100% on room air, normal by my interpretation.    COORDINATION OF CARE: 12:17--Patient / Family / Caregiver informed of clinical course, understand medical decision-making process, and agree with plan.   Labs Reviewed - No data to display Dg Finger Middle Right  11/03/2011  *RADIOLOGY REPORT*   Clinical Data: Finger injury.  Laceration.  RIGHT MIDDLE FINGER 2+V  Comparison: None  Findings: There has been amputation of the distal aspect of the digit, including the tuft of the distal phalanx.  No radiopaque foreign bodies identified.  The middle and proximal phalanges are intact  IMPRESSION: Amputation of the distal aspect of the digit.   Original Report Authenticated By: Patterson Hammersmith, M.D.      1. Traumatic amputation of fingertip       MDM  Patient / Family / Caregiver informed of clinical course, understand medical decision-making process, and agree with plan. I personally performed the services described in this documentation, which was scribed in my presence. The recorded information has been reviewed and considered. I doubt any other EMC precluding discharge at this time.  Hurman Horn, MD 11/04/11 [phone]

## 2011-11-05 ENCOUNTER — Encounter (HOSPITAL_BASED_OUTPATIENT_CLINIC_OR_DEPARTMENT_OTHER): Payer: Self-pay | Admitting: *Deleted

## 2011-11-05 NOTE — Progress Notes (Signed)
To come in for [id number]  

## 2011-11-06 ENCOUNTER — Encounter (HOSPITAL_BASED_OUTPATIENT_CLINIC_OR_DEPARTMENT_OTHER)
Admission: RE | Admit: 2011-11-06 | Discharge: 2011-11-06 | Disposition: A | Discharge status: Home / Self Care | Payer: BC Managed Care – PPO | Source: Ambulatory Visit | Attending: Orthopedic Surgery | Admitting: Orthopedic Surgery

## 2011-11-06 LAB — BASIC METABOLIC PANEL
Chloride: 96 mEq/L (ref 96–112)
Creatinine, Ser: 0.83 mg/dL (ref 0.50–1.10)
GFR calc Af Amer: 88 mL/min — ABNORMAL LOW (ref >90)
GFR calc non Af Amer: 76 mL/min — ABNORMAL LOW (ref >90)
Potassium: 4.6 mEq/L (ref 3.5–5.1)

## 2011-11-07 ENCOUNTER — Encounter (HOSPITAL_BASED_OUTPATIENT_CLINIC_OR_DEPARTMENT_OTHER): Payer: Self-pay | Admitting: Anesthesiology

## 2011-11-07 ENCOUNTER — Encounter (HOSPITAL_BASED_OUTPATIENT_CLINIC_OR_DEPARTMENT_OTHER)
Admission: RE | Disposition: A | Discharge status: Home / Self Care | Payer: Self-pay | Source: Ambulatory Visit | Attending: Orthopedic Surgery

## 2011-11-07 ENCOUNTER — Ambulatory Visit (HOSPITAL_BASED_OUTPATIENT_CLINIC_OR_DEPARTMENT_OTHER)
Admission: RE | Admit: 2011-11-07 | Discharge: 2011-11-07 | Disposition: A | Discharge status: Home / Self Care | Payer: BC Managed Care – PPO | Source: Ambulatory Visit | Attending: Orthopedic Surgery | Admitting: Orthopedic Surgery

## 2011-11-07 ENCOUNTER — Ambulatory Visit (HOSPITAL_BASED_OUTPATIENT_CLINIC_OR_DEPARTMENT_OTHER): Payer: BC Managed Care – PPO | Admitting: Anesthesiology

## 2011-11-07 ENCOUNTER — Encounter (HOSPITAL_BASED_OUTPATIENT_CLINIC_OR_DEPARTMENT_OTHER): Payer: Self-pay | Admitting: *Deleted

## 2011-11-07 DIAGNOSIS — Z21 Asymptomatic human immunodeficiency virus [HIV] infection status: Secondary | ICD-10-CM | POA: Insufficient documentation

## 2011-11-07 DIAGNOSIS — X58XXXA Exposure to other specified factors, initial encounter: Secondary | ICD-10-CM | POA: Insufficient documentation

## 2011-11-07 DIAGNOSIS — Z79899 Other long term (current) drug therapy: Secondary | ICD-10-CM | POA: Insufficient documentation

## 2011-11-07 DIAGNOSIS — S68119A Complete traumatic metacarpophalangeal amputation of unspecified finger, initial encounter: Secondary | ICD-10-CM

## 2011-11-07 DIAGNOSIS — S62639B Displaced fracture of distal phalanx of unspecified finger, initial encounter for open fracture: Secondary | ICD-10-CM | POA: Insufficient documentation

## 2011-11-07 DIAGNOSIS — J45909 Unspecified asthma, uncomplicated: Secondary | ICD-10-CM | POA: Insufficient documentation

## 2011-11-07 DIAGNOSIS — I10 Essential (primary) hypertension: Secondary | ICD-10-CM | POA: Insufficient documentation

## 2011-11-07 HISTORY — DX: Nausea with vomiting, unspecified: R11.2

## 2011-11-07 HISTORY — DX: Other specified postprocedural states: Z98.890

## 2011-11-07 HISTORY — DX: Spinal stenosis, site unspecified: M48.00

## 2011-11-07 LAB — POCT HEMOGLOBIN-HEMACUE: Hemoglobin: 14.4 g/dL (ref 12.0–15.0)

## 2011-11-07 SURGERY — DEBRIDEMENT, WOUND, WITH CLOSURE
Anesthesia: General | Site: Finger | Laterality: Right | Wound class: Clean

## 2011-11-07 MED ORDER — MEPERIDINE HCL 25 MG/ML IJ SOLN: 6.2500 mg | INTRAMUSCULAR | Status: DC | PRN

## 2011-11-07 MED ORDER — OXYCODONE HCL 5 MG PO TABS: 5.0000 mg | ORAL_TABLET | Freq: Once | ORAL | Status: DC | PRN

## 2011-11-07 MED ORDER — ACETAMINOPHEN 10 MG/ML IV SOLN
1000.0000 mg | Freq: Once | INTRAVENOUS | Status: AC
  Administered 2011-11-07: 1000 mg via INTRAVENOUS

## 2011-11-07 MED ORDER — PROMETHAZINE HCL 25 MG/ML IJ SOLN
6.2500 mg | INTRAMUSCULAR | Status: DC | PRN
Start: 2011-11-07 — End: 2011-11-07

## 2011-11-07 MED ORDER — BUPIVACAINE HCL (PF) 0.25 % IJ SOLN
INTRAMUSCULAR | Status: DC | PRN
  Administered 2011-11-07: 7 mL

## 2011-11-07 MED ORDER — DOCUSATE SODIUM 100 MG PO CAPS: 100.0000 mg | ORAL_CAPSULE | Freq: Two times a day (BID) | ORAL | Status: DC

## 2011-11-07 MED ORDER — FENTANYL CITRATE 0.05 MG/ML IJ SOLN: 25.0000 ug | INTRAMUSCULAR | Status: DC | PRN

## 2011-11-07 MED ORDER — LACTATED RINGERS IV SOLN
INTRAVENOUS | Status: DC
  Administered 2011-11-07 (×2): via INTRAVENOUS

## 2011-11-07 MED ORDER — ONDANSETRON HCL 4 MG/2ML IJ SOLN
INTRAMUSCULAR | Status: DC | PRN
  Administered 2011-11-07: 4 mg via INTRAVENOUS

## 2011-11-07 MED ORDER — MIDAZOLAM HCL 2 MG/2ML IJ SOLN: 0.5000 mg | Freq: Once | INTRAMUSCULAR | Status: DC | PRN

## 2011-11-07 MED ORDER — HYDROCODONE-ACETAMINOPHEN 5-500 MG PO TABS: 1.0000 | ORAL_TABLET | Freq: Four times a day (QID) | ORAL | Status: DC | PRN

## 2011-11-07 MED ORDER — LIDOCAINE HCL (CARDIAC) 20 MG/ML IV SOLN
INTRAVENOUS | Status: DC | PRN
  Administered 2011-11-07: 50 mg via INTRAVENOUS

## 2011-11-07 MED ORDER — SCOPOLAMINE 1 MG/3DAYS TD PT72
1.0000 | MEDICATED_PATCH | TRANSDERMAL | Status: DC
  Administered 2011-11-07: 1.5 mg via TRANSDERMAL

## 2011-11-07 MED ORDER — PROPOFOL INFUSION 10 MG/ML OPTIME
INTRAVENOUS | Status: DC | PRN
  Administered 2011-11-07: 20 mL via INTRAVENOUS

## 2011-11-07 MED ORDER — MIDAZOLAM HCL 5 MG/5ML IJ SOLN
INTRAMUSCULAR | Status: DC | PRN
  Administered 2011-11-07: 2 mg via INTRAVENOUS

## 2011-11-07 MED ORDER — DOXYCYCLINE HYCLATE 100 MG PO TABS: 100.0000 mg | ORAL_TABLET | Freq: Two times a day (BID) | ORAL | Status: DC

## 2011-11-07 MED ORDER — CEPHALEXIN 500 MG PO CAPS: 500.0000 mg | ORAL_CAPSULE | Freq: Four times a day (QID) | ORAL | Status: DC

## 2011-11-07 MED ORDER — DEXAMETHASONE SODIUM PHOSPHATE 10 MG/ML IJ SOLN
INTRAMUSCULAR | Status: DC | PRN
  Administered 2011-11-07: 10 mg via INTRAVENOUS

## 2011-11-07 MED ORDER — OXYCODONE HCL 5 MG/5ML PO SOLN: 5.0000 mg | Freq: Once | ORAL | Status: DC | PRN

## 2011-11-07 MED ORDER — EPHEDRINE SULFATE 50 MG/ML IJ SOLN
INTRAMUSCULAR | Status: DC | PRN
  Administered 2011-11-07: 10 mg via INTRAVENOUS

## 2011-11-07 MED ORDER — FENTANYL CITRATE 0.05 MG/ML IJ SOLN
INTRAMUSCULAR | Status: DC | PRN
  Administered 2011-11-07 (×2): 50 ug via INTRAVENOUS

## 2011-11-07 MED ORDER — CEFAZOLIN SODIUM-DEXTROSE 2-3 GM-% IV SOLR
INTRAVENOUS | Status: DC | PRN
  Administered 2011-11-07: 2 g via INTRAVENOUS

## 2011-11-07 SURGICAL SUPPLY — 57 items
BANDAGE CONFORM 3  STR LF (GAUZE/BANDAGES/DRESSINGS) ×1 IMPLANT
BANDAGE ELASTIC 3 VELCRO ST LF (GAUZE/BANDAGES/DRESSINGS) ×1 IMPLANT
BANDAGE GAUZE ELAST BULKY 4 IN (GAUZE/BANDAGES/DRESSINGS) ×1 IMPLANT
BANDAGE GAUZE STRT 1 STR LF (GAUZE/BANDAGES/DRESSINGS) ×2 IMPLANT
BLADE SURG 15 STRL LF DISP TIS (BLADE) ×2 IMPLANT
BLADE SURG 15 STRL SS (BLADE) ×3
BNDG CMPR 9X4 STRL LF SNTH (GAUZE/BANDAGES/DRESSINGS) ×2
BNDG COHESIVE 1X5 TAN STRL LF (GAUZE/BANDAGES/DRESSINGS) ×2 IMPLANT
BNDG COHESIVE 3X5 TAN STRL LF (GAUZE/BANDAGES/DRESSINGS) ×1 IMPLANT
BNDG ESMARK 4X9 LF (GAUZE/BANDAGES/DRESSINGS) ×3 IMPLANT
BRUSH SCRUB EZ PLAIN DRY (MISCELLANEOUS) ×1 IMPLANT
CANISTER SUCTION 1200CC (MISCELLANEOUS) ×1 IMPLANT
CLOTH BEACON ORANGE TIMEOUT ST (SAFETY) ×3 IMPLANT
CORDS BIPOLAR (ELECTRODE) ×3 IMPLANT
COVER MAYO STAND STRL (DRAPES) ×3 IMPLANT
COVER TABLE BACK 60X90 (DRAPES) ×3 IMPLANT
CUFF TOURNIQUET SINGLE 18IN (TOURNIQUET CUFF) ×2 IMPLANT
DECANTER SPIKE VIAL GLASS SM (MISCELLANEOUS) ×1 IMPLANT
DRAIN PENROSE 1/4X12 LTX STRL (WOUND CARE) ×1 IMPLANT
DRAPE EXTREMITY T 121X128X90 (DRAPE) ×3 IMPLANT
DRAPE SURG 17X23 STRL (DRAPES) ×3 IMPLANT
DRSG EMULSION OIL 3X3 NADH (GAUZE/BANDAGES/DRESSINGS) ×3 IMPLANT
GAUZE SPONGE 4X4 16PLY XRAY LF (GAUZE/BANDAGES/DRESSINGS) ×3 IMPLANT
GLOVE BIO SURGEON STRL SZ 6.5 (GLOVE) ×2 IMPLANT
GLOVE BIO SURGEON STRL SZ7 (GLOVE) ×2 IMPLANT
GLOVE BIO SURGEON STRL SZ8 (GLOVE) ×3 IMPLANT
GLOVE BIOGEL PI IND STRL 7.0 (GLOVE) ×1 IMPLANT
GLOVE BIOGEL PI IND STRL 8.5 (GLOVE) ×1 IMPLANT
GLOVE BIOGEL PI INDICATOR 7.0 (GLOVE) ×1
GLOVE BIOGEL PI INDICATOR 8.5 (GLOVE) ×1
GOWN PREVENTION PLUS XLARGE (GOWN DISPOSABLE) ×3 IMPLANT
GOWN PREVENTION PLUS XXLARGE (GOWN DISPOSABLE) ×3 IMPLANT
LOOP VESSEL MAXI BLUE (MISCELLANEOUS) ×1 IMPLANT
NDL HYPO 25X1 1.5 SAFETY (NEEDLE) ×1 IMPLANT
NEEDLE HYPO 22GX1.5 SAFETY (NEEDLE) ×1 IMPLANT
NEEDLE HYPO 25X1 1.5 SAFETY (NEEDLE) IMPLANT
PACK BASIN DAY SURGERY FS (CUSTOM PROCEDURE TRAY) ×3 IMPLANT
PAD ALCOHOL SWAB (MISCELLANEOUS) ×1 IMPLANT
PAD CAST 3X4 CTTN HI CHSV (CAST SUPPLIES) ×1 IMPLANT
PADDING CAST ABS 4INX4YD NS (CAST SUPPLIES)
PADDING CAST ABS COTTON 4X4 ST (CAST SUPPLIES) ×1 IMPLANT
PADDING CAST COTTON 3X4 STRL (CAST SUPPLIES)
SPLINT FIBERGLASS 3X35 (CAST SUPPLIES) ×1 IMPLANT
SPLINT PLASTER CAST XFAST 3X15 (CAST SUPPLIES) ×1 IMPLANT
SPLINT PLASTER XTRA FASTSET 3X (CAST SUPPLIES)
STOCKINETTE 4X48 STRL (DRAPES) ×3 IMPLANT
SUT CHROMIC 5 0 P 3 (SUTURE) ×2 IMPLANT
SUT ETHILON 5 0 P 3 18 (SUTURE) ×1
SUT NYLON ETHILON 5-0 P-3 1X18 (SUTURE) ×1 IMPLANT
SUT PROLENE 4 0 PS 2 18 (SUTURE) ×1 IMPLANT
SUT VIC AB 4-0 SH 27 (SUTURE)
SUT VIC AB 4-0 SH 27XANBCTRL (SUTURE) ×1 IMPLANT
SYR BULB 3OZ (MISCELLANEOUS) ×3 IMPLANT
SYR CONTROL 10ML LL (SYRINGE) ×1 IMPLANT
TOWEL OR 17X24 6PK STRL BLUE (TOWEL DISPOSABLE) ×3 IMPLANT
TRAY DSU PREP LF (CUSTOM PROCEDURE TRAY) ×3 IMPLANT
UNDERPAD 30X30 INCONTINENT (UNDERPADS AND DIAPERS) ×3 IMPLANT

## 2011-11-07 NOTE — Transfer of Care (Signed)
Immediate Anesthesia Transfer of Care Note  Patient: [name]  Procedure(s) Performed: Procedure(s) (LRB) with comments: IRRIGATION AND DEBRIDEMENT EXTREMITY (Right) - OPEN DEBRIDEMENT/ADVANCEMENT FLAP CLOSURE  Patient Location: PACU  Anesthesia Type: General  Level of Consciousness: awake and oriented  Airway & Oxygen Therapy: Patient Spontanous Breathing and Patient connected to face mask oxygen  Post-op Assessment: Report given to PACU RN  Post vital signs: Reviewed and stable  Complications: No apparent anesthesia complications

## 2011-11-07 NOTE — Anesthesia Preprocedure Evaluation (Signed)
Anesthesia Evaluation  Patient identified by MRN, date of birth, ID band Patient awake    Reviewed: Allergy & Precautions, H&P , NPO status , Patient's Chart, lab work & pertinent test results  History of Anesthesia Complications (+) PONV  Airway Mallampati: I TM Distance: >3 FB Neck ROM: Full    Dental  (+) Dental Advisory Given   Pulmonary asthma (no inhaler needed in over 15 years) , neg COPD breath sounds clear to auscultation  Pulmonary exam normal       Cardiovascular hypertension, Pt. on medications Rhythm:Regular Rate:Normal     Neuro/Psych vertigo negative psych ROS   GI/Hepatic negative GI ROS, Neg liver ROS,   Endo/Other  negative endocrine ROS  Renal/GU negative Renal ROS     Musculoskeletal   Abdominal   Peds  Hematology   Anesthesia Other Findings HIV: takes antivirals  Reproductive/Obstetrics                           Anesthesia Physical Anesthesia Plan  ASA: III  Anesthesia Plan: General   Post-op Pain Management:    Induction: Intravenous  Airway Management Planned: LMA  Additional Equipment:   Intra-op Plan:   Post-operative Plan:   Informed Consent: I have reviewed the patients History and Physical, chart, labs and discussed the procedure including the risks, benefits and alternatives for the proposed anesthesia with the patient or authorized representative who has indicated his/her understanding and acceptance.   Dental advisory given  Plan Discussed with: CRNA and Surgeon  Anesthesia Plan Comments: (Plan routine monitors, GA- LMA OK)        Anesthesia Quick Evaluation

## 2011-11-07 NOTE — H&P (Signed)
[name] is an 61 y.o. female.   Chief Complaint: RIGHT LONG FINGER INJURY HPI: PT WORKING AT HOME SUSTAINED OPEN INJURY TO RIGHT LONG FINGER PT PRESENTED TO OFFICE WITH OPEN INJURY TO LONG FINGER PT HERE FOR SURGERY ON RIGHT LONG FINGER  Past Medical History  Diagnosis Date  . HIV (human immunodeficiency virus infection)   . Hypertension   . Migraine   . Spinal stenosis   . PONV (postoperative nausea and vomiting)     said usually has to have scop patch    Past Surgical History  Procedure Date  . Cervical fusion 1989  . Abdominal hysterectomy 1990  . Tonsillectomy   . Carpal tunnel release     both hands    History reviewed. No pertinent family history. Social History:  reports that she has never smoked. She has never used smokeless tobacco. She reports that she drinks about .5 ounces of alcohol per week. She reports that she does not use illicit drugs.  Allergies:  Allergies  Allergen Reactions  . Azithromycin Swelling    Medications Prior to Admission  Medication Sig Dispense Refill  . amLODipine (NORVASC) 2.5 MG tablet Take 2.5 mg by mouth daily.        Marland Kitchen atazanavir (REYATAZ) 300 MG capsule Take 300 mg by mouth daily with breakfast.      . benazepril (LOTENSIN) 40 MG tablet Take 40 mg by mouth daily.        . cephALEXin (KEFLEX) 500 MG capsule 2 caps po bid x 7 days  28 capsule  0  . cyclobenzaprine (FLEXERIL) 10 MG tablet Take 10 mg by mouth at bedtime.        Marland Kitchen emtricitabine-tenofovir (TRUVADA) 200-300 MG per tablet Take 1 tablet by mouth daily.      Marland Kitchen estrogens, conjugated, (PREMARIN) 0.625 MG tablet Take 0.625 mg by mouth daily. Take daily for 21 days then do not take for 7 days.       Marland Kitchen gabapentin (NEURONTIN) 100 MG tablet Take 100 mg by mouth 3 (three) times daily.       . naproxen sodium (ANAPROX) 220 MG tablet Take 440 mg by mouth 2 (two) times daily.       . Omega-3 Fatty Acids (FISH OIL) 1200 MG CAPS Take 1,200 mg by mouth 2 (two) times daily.         Marland Kitchen oxyCODONE-acetaminophen (PERCOCET) 5-325 MG per tablet Take 2 tablets by mouth every 4 (four) hours as needed for pain.  20 tablet  0  . ritonavir (NORVIR) 100 MG capsule Take 100 mg by mouth daily.        Results for orders placed during the hospital encounter of 11/07/11 (from the past 48 hour(s))  BASIC METABOLIC PANEL     Status: Abnormal   Collection Time   11/06/11 11:00 AM      Component Value Range Comment   Sodium 135  135 - 145 mEq/L    Potassium 4.6  3.5 - 5.1 mEq/L    Chloride 96  96 - 112 mEq/L    CO2 30  19 - 32 mEq/L    Glucose, Bld 85  70 - 99 mg/dL    BUN 11  6 - 23 mg/dL    Creatinine, Ser 0.98  0.50 - 1.10 mg/dL    Calcium 9.9  8.4 - 11.9 mg/dL    GFR calc non Af Amer 76 (*) >90 mL/min    GFR calc Af Amer 88 (*) >90  mL/min    No results found.  NO RECENT ILLNESSES OR HOSPITALIZATIONS  Blood pressure 135/84, pulse 78, temperature 98.4 F (36.9 C), temperature source Oral, resp. rate 18, height 5\' 5"  (1.651 m), weight 69.854 kg (154 lb), SpO2 97.00%. General Appearance:  Alert, cooperative, no distress, appears stated age  Head:  Normocephalic, without obvious abnormality, atraumatic  Eyes:  Pupils equal, conjunctiva/corneas clear,         Throat: Lips, mucosa, and tongue normal; teeth and gums normal  Neck: No visible masses     Lungs:   respirations unlabored  Chest Wall:  No tenderness or deformity  Heart:  Regular rate and rhythm,  Abdomen:   Soft, non-tender,         Extremities: RIGHT LONG FINGER WITH OPEN WOUND WITH EXPOSED DISTAL PHALANX FINGERS WARM WELL PERFUSED GOOD PIP AND DIP MOTION NO INJURY TO INDEX/RING/SMALL  Pulses: 2+ and symmetric  Skin: Skin color, texture, turgor normal, no rashes or lesions     Neurologic: Normal    Assessment/Plan RIGHT LONG FINGER OPEN DISTAL PHALANX FRACTURE WITH EXPOSED BONE  RIGHT LONG FINGER DEBRIDEMENT AND ADVANCEMENT FLAP CLOSURE POSSIBLE SKIN GRAFTING  R/B/A DISCUSSED WITH PT IN OFFICE.  PT  VOICED UNDERSTANDING OF PLAN CONSENT SIGNED DAY OF SURGERY PT SEEN AND EXAMINED PRIOR TO OPERATIVE PROCEDURE/DAY OF SURGERY SITE MARKED. QUESTIONS ANSWERED WILL GO HOME FOLLOWING SURGERY  Sharma Covert 11/07/2011, 1:51 PM

## 2011-11-07 NOTE — Anesthesia Postprocedure Evaluation (Signed)
  Anesthesia Post-op Note  Patient: [name]  Procedure(s) Performed: Procedure(s) (LRB) with comments: DEBRIDEMENT AND CLOSURE WOUND (Right) - flap closure right long finger  Patient Location: PACU  Anesthesia Type: General  Level of Consciousness: awake, alert , oriented and patient cooperative  Airway and Oxygen Therapy: Patient Spontanous Breathing  Post-op Pain: none  Post-op Assessment: Post-op Vital signs reviewed, Patient's Cardiovascular Status Stable, Respiratory Function Stable, Patent Airway, No signs of Nausea or vomiting, Adequate PO intake and Pain level controlled  Post-op Vital Signs: Reviewed and stable  Complications: No apparent anesthesia complications

## 2011-11-07 NOTE — Brief Op Note (Signed)
11/07/2011  2:41 PM  PATIENT:  Lynessa H Rupnow  61 y.o. female  PRE-OPERATIVE DIAGNOSIS:  RIGHT LONG FINGER OPEN DISTAL PHALANX FRACTURE  POST-OPERATIVE DIAGNOSIS: same  PROCEDURE:  Procedure(s) (LRB) with comments: IRRIGATION AND DEBRIDEMENT EXTREMITY (Right) - OPEN DEBRIDEMENT/ADVANCEMENT FLAP CLOSURE  SURGEON:  Surgeon(s) and Role:    * Sharma Covert, MD - Primary  PHYSICIAN ASSISTANT:   ASSISTANTS: none   ANESTHESIA:   general  EBL:     BLOOD ADMINISTERED:none  DRAINS: none   LOCAL MEDICATIONS USED:  MARCAINE     SPECIMEN:  No Specimen  DISPOSITION OF SPECIMEN:  N/A  COUNTS:  YES  TOURNIQUET:  * Missing tourniquet times found for documented tourniquets in log:  16109 *  DICTATION: [id number]  PLAN OF CARE: Discharge to home after PACU  PATIENT DISPOSITION:  PACU - hemodynamically stable.   Delay start of Pharmacological VTE agent (>24hrs) due to surgical blood loss or risk of bleeding: not applicable

## 2011-11-08 NOTE — Op Note (Signed)
NAMESUSAN, BLEICH             ACCOUNT NO.:  [id number]  MEDICAL RECORD NO.:  [id number]  LOCATION:  TR07C                        FACILITY:  [facility]  PHYSICIAN:  Madelynn Done, MD  DATE OF BIRTH:  06/02/1950  DATE OF PROCEDURE:  11/07/2011 DATE OF DISCHARGE:  11/03/2011                              OPERATIVE REPORT   PREOPERATIVE DIAGNOSIS:  Right long finger open distal phalanx fracture.  POSTOPERATIVE DIAGNOSIS:  Right long finger open distal phalanx fracture.  ATTENDING PHYSICIAN:  Madelynn Done, MD who scrubbed and present for the entire procedure.  ASSISTANT SURGEON:  None.  ANESTHESIA:  General via LMA.  SURGICAL PROCEDURE: 1. Open debridement of open distal phalanx fracture, debridement of     skin, subcutaneous tissue, and bone. 2. Open treatment of right long finger distal phalanx fracture without     internal fixation. 3. Right long finger advancement flap, V-Y advancement flap closure     rotational flap.  SURGICAL INDICATIONS:  [name] [name] is a 61 year old right-hand- dominant female who sustained an open injury to the right long finger distal phalanx.  The patient had the exposed bone and soft tissue injury.  The patient is recommended to undergo the above procedure. Risks, benefits, and alternatives were discussed in detail with the patient and signed informed consent was obtained.  Risks include, but not limited to bleeding, infection, damage to nearby nerves, arteries, or tendons, skin flap loss, need for further surgical intervention.  DESCRIPTION OF PROCEDURE:  The patient was properly identified in the preop holding area and mark with a permanent marker made on the right long finger to indicate correct operative site.  The patient was then brought back to the operating room, placed supine on the anesthesia room table.  General anesthesia was administered.  The patient tolerated this well.  Well-padded tourniquet was then placed on the right  brachium and sealed with 1000 drape.  Right upper extremity was then prepped and draped in normal sterile fashion.  Time-out was called, correct side was identified, and procedure then begun.  Attention was then turned to the right long finger where the limb was elevated and tourniquet inflated. Excisional debridement was then carried out sharply with knife and rongeur of the open fracture.  The patient did have several fragmented distal piece to the distal phalanx.  These were removed with a rongeur, taken back to a smooth surface.  Open treatment of distal phalangeal fracture was carried out without internal fixation.  The wound was then thoroughly irrigated.  Following this, given the soft tissue injury and the exposed bone V-Y flap was then carried out taking the V all the way down to the distal interphalangeal crease.  Skin flaps were then raised and then sewn distally with 5-0 chromic suture.  The remaining portion of the V was then closed with 5-0 nylon sutures in creating a Y was then closed with 5-0 nylon sutures.  Following this, tourniquet was inflated. There was good perfusion of the fingertip.  The Adaptic dressing, sterile compressive bandage were then applied.  The patient was then extubated and taken to the recovery room in good condition.  POSTPROCEDURE PLAN:  The patient will be  discharged home, seen back in the office in approximately 1 week for wound check and then back in a small tip protector splint and then increase use and gradual in activity.     Madelynn Done, MD     FWO/MEDQ  D:  11/07/2011  T:  11/08/2011  Job:  [id number]

## 2011-12-09 ENCOUNTER — Ambulatory Visit (INDEPENDENT_AMBULATORY_CARE_PROVIDER_SITE_OTHER): Payer: BC Managed Care – PPO

## 2011-12-09 DIAGNOSIS — Z23 Encounter for immunization: Secondary | ICD-10-CM

## 2012-02-20 ENCOUNTER — Other Ambulatory Visit: Payer: BC Managed Care – PPO

## 2012-03-05 ENCOUNTER — Other Ambulatory Visit: Payer: Self-pay | Admitting: Infectious Diseases

## 2012-03-05 DIAGNOSIS — B20 Human immunodeficiency virus [HIV] disease: Secondary | ICD-10-CM

## 2012-03-06 ENCOUNTER — Other Ambulatory Visit: Payer: Self-pay | Admitting: *Deleted

## 2012-03-06 ENCOUNTER — Ambulatory Visit: Payer: BC Managed Care – PPO | Admitting: Infectious Diseases

## 2012-03-06 DIAGNOSIS — B20 Human immunodeficiency virus [HIV] disease: Secondary | ICD-10-CM

## 2012-03-06 MED ORDER — RITONAVIR 100 MG PO TABS: 100.0000 mg | ORAL_TABLET | Freq: Every day | ORAL | Status: DC

## 2012-03-06 MED ORDER — EMTRICITABINE-TENOFOVIR DF 200-300 MG PO TABS: 1.0000 | ORAL_TABLET | Freq: Every day | ORAL | Status: DC

## 2012-03-10 ENCOUNTER — Other Ambulatory Visit: Payer: Self-pay | Admitting: *Deleted

## 2012-03-10 DIAGNOSIS — B20 Human immunodeficiency virus [HIV] disease: Secondary | ICD-10-CM

## 2012-03-10 MED ORDER — RITONAVIR 100 MG PO TABS: 100.0000 mg | ORAL_TABLET | Freq: Every day | ORAL | Status: DC

## 2012-04-23 ENCOUNTER — Other Ambulatory Visit: Payer: Self-pay | Admitting: Infectious Diseases

## 2012-04-23 ENCOUNTER — Other Ambulatory Visit: Payer: BC Managed Care – PPO

## 2012-04-23 ENCOUNTER — Other Ambulatory Visit: Payer: Self-pay

## 2012-04-23 DIAGNOSIS — B20 Human immunodeficiency virus [HIV] disease: Secondary | ICD-10-CM

## 2012-04-23 DIAGNOSIS — Z113 Encounter for screening for infections with a predominantly sexual mode of transmission: Secondary | ICD-10-CM

## 2012-04-23 LAB — CBC WITH DIFFERENTIAL/PLATELET
Basophils Absolute: 0 10*3/uL (ref 0.0–0.1)
Basophils Relative: 1 % (ref 0–1)
Eosinophils Absolute: 0.2 10*3/uL (ref 0.0–0.7)
HCT: 41.3 % (ref 36.0–46.0)
Hemoglobin: 14.7 g/dL (ref 12.0–15.0)
MCH: 32.2 pg (ref 26.0–34.0)
MCHC: 35.6 g/dL (ref 30.0–36.0)
Monocytes Absolute: 0.5 10*3/uL (ref 0.1–1.0)
Monocytes Relative: 8 % (ref 3–12)
RDW: 13.3 % (ref 11.5–15.5)

## 2012-04-23 LAB — COMPREHENSIVE METABOLIC PANEL
Albumin: 4.4 g/dL (ref 3.5–5.2)
Alkaline Phosphatase: 80 U/L (ref 39–117)
BUN: 14 mg/dL (ref 6–23)
Creat: 0.84 mg/dL (ref 0.50–1.10)
Glucose, Bld: 77 mg/dL (ref 70–99)
Total Bilirubin: 1.7 mg/dL — ABNORMAL HIGH (ref 0.3–1.2)

## 2012-04-24 LAB — T-HELPER CELL (CD4) - (RCID CLINIC ONLY): CD4 % Helper T Cell: 39 % (ref 33–55)

## 2012-04-26 LAB — HIV-1 RNA QUANT-NO REFLEX-BLD: HIV 1 RNA Quant: <20 copies/mL (ref <20)

## 2012-05-08 ENCOUNTER — Encounter: Payer: Self-pay | Admitting: Infectious Diseases

## 2012-05-08 ENCOUNTER — Ambulatory Visit: Payer: BC Managed Care – PPO | Admitting: Infectious Diseases

## 2012-05-08 VITALS — BP 127/81 | HR 89 | Temp 97.9°F | Ht 65.0 in | Wt 154.0 lb

## 2012-05-08 DIAGNOSIS — B20 Human immunodeficiency virus [HIV] disease: Secondary | ICD-10-CM

## 2012-05-08 MED ORDER — ELVITEG-COBIC-EMTRICIT-TENOFDF 150-150-200-300 MG PO TABS: 1.0000 | ORAL_TABLET | Freq: Every day | ORAL | Status: DC

## 2012-06-02 ENCOUNTER — Other Ambulatory Visit: Payer: Self-pay | Admitting: *Deleted

## 2012-06-02 DIAGNOSIS — B20 Human immunodeficiency virus [HIV] disease: Secondary | ICD-10-CM

## 2012-06-02 MED ORDER — ELVITEG-COBIC-EMTRICIT-TENOFDF 150-150-200-300 MG PO TABS: 1.0000 | ORAL_TABLET | Freq: Every day | ORAL | Status: DC

## 2012-07-16 ENCOUNTER — Encounter: Payer: Self-pay | Admitting: Nurse Practitioner

## 2012-07-16 ENCOUNTER — Ambulatory Visit (INDEPENDENT_AMBULATORY_CARE_PROVIDER_SITE_OTHER): Payer: BC Managed Care – PPO | Admitting: Nurse Practitioner

## 2012-07-16 VITALS — BP 117/74 | HR 84 | Ht 63.0 in | Wt 151.0 lb

## 2012-07-16 DIAGNOSIS — G609 Hereditary and idiopathic neuropathy, unspecified: Secondary | ICD-10-CM

## 2012-07-16 DIAGNOSIS — M5416 Radiculopathy, lumbar region: Secondary | ICD-10-CM | POA: Insufficient documentation

## 2012-07-16 DIAGNOSIS — M542 Cervicalgia: Secondary | ICD-10-CM | POA: Insufficient documentation

## 2012-07-16 DIAGNOSIS — IMO0002 Reserved for concepts with insufficient information to code with codable children: Secondary | ICD-10-CM

## 2012-07-16 MED ORDER — CYCLOBENZAPRINE HCL 10 MG PO TABS: 10.0000 mg | ORAL_TABLET | Freq: Every day | ORAL | Status: DC

## 2012-07-16 MED ORDER — GABAPENTIN 100 MG PO CAPS: 100.0000 mg | ORAL_CAPSULE | Freq: Four times a day (QID) | ORAL | Status: DC

## 2012-07-16 NOTE — Progress Notes (Signed)
HPI: Patient returns for followup after last visit 01/16/2012. She was originally  evaluated by Dr. Sandria Manly 03/21/09.  She has  a history of HIV infection since 1992 contracted from her first husband  She's been followed  by Dr. Lina Sayre, infectious disease specialist in [place]  for many years. 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  She has a history of arthritis and underwent carpal tunnel syndrome surgery to both hands by Dr.Nudelman in 2006 and underwent C5-C6 anterior cervical fusion by Dr.Botero in 1989. She's had a history of lower back pain followed by Dr. Cleophas Dunker with epidural steroid injections. Following one set of epidural steroid injections she developed a low serum sodium of 120 requiring hospitalization.Dr. Sandria Manly saw her 04/15/2007 with proximal upper extremity weakness and lower extremity weakness most likely secondary to myopathy. EMG/NCV was normal and her symptoms improved with time. Blood studies included angiotensin-converting, ANA, CPK, sedimentation rate, RPR, serum protein electrophoresis, hemoglobin A1c, fasting glucose,TSH, and B12 which were normal 04/16/07. Since November 2010 she reports a nagging headache occurring at the base of her skull extending toward her ears bilaterally and also making her scalp "sore to touch". She was seen by Ear, Nose,and Throat physicians and placed on courses of exercises which improved, but  did not relieve her symptoms. For her headaches and neck pain.,she's been seen by her family practice doctor, an orthopedist Dr. Cleophas Dunker, and  Dr.Nudelman,  a neurosurgeon. She says with her headaches she has difficulty with bright lights and loud noises.She notes dry eyes. She has a history of migraine headaches in her 96s. She underwent an MRI study of the cervical spine without contrast which revealed (2010)  increased spondylosis at C3-4 and C6 -7 and mild cord deformities at both levels, greater at C 6-7. At C4-5  she has spondylosis with  central cord indented by disc. She underwent MRI Brain study 02/18/2009 with and  without contrast showing bilateral  white matter hyperintense lesions. These were nonspecific. Has been seen by Dr. Dione Booze and placed on  eyedrops.She was started on Flexeril and Gabapentin by Dr. Sandria Manly and her symptoms have greatly improved, since April 2011.   She returns today for followup and her headaches are in control with gabapentin and Flexeril. She has not had further bouts of vertigo. She is doing her vestibular exercises. She has no new neurologic complaints     ROS:  Seasonal allergies  Physical Exam General: well developed, well nourished, seated, in no evident distress Head: head normocephalic and atraumatic. Oropharynx benign Neck: Neck flexion and extension maneuvers with mild restriction , no carotid  bruits Cardiovascular: regular rate and rhythm, no murmurs  Neurologic Exam Mental Status: Awake and fully alert. Oriented to place and time. Follows all commands. Speech and language normal.   Cranial Nerves: Pupils equal, briskly reactive to light. Extraocular movements full without nystagmus. Visual fields full to confrontation. Hearing intact and symmetric to finger snap. Facial sensation intact. Face, tongue, palate move normally and symmetrically. Neck flexion and extension normal.  Motor: Normal bulk and tone. Normal strength in all tested extremity muscles.No focal weakness Sensory.: intact to touch and pinprick and vibratory.  Coordination: Rapid alternating movements normal in all extremities. Finger-to-nose and heel-to-shin performed accurately bilaterally. Gait and Station: Arises from chair without difficulty. Stance is normal. Gait demonstrates normal stride length and balance . Able to heel, toe and tandem walk without difficulty. Romberg negative Reflexes: 2+ and symmetric. Toes downgoing.     ASSESSMENT: History of  tension and vascular headaches that have improved on gabapentin  Flexeril History of benign paroxysmal vertigo with no episodes since last seen HIV-positive     PLAN: Continue gabapentin 4 times a day will renew Continues Flexeril at bedtime will renew  followup in one year and as necessary Patient to be assigned to Dr. Carmelina Noun, GNP-BC APRN

## 2012-07-16 NOTE — Patient Instructions (Addendum)
Continue gabapentin 4 times a day will renew Continues Flexeril at bedtime will renew Symptoms are stable, followup in one year and as necessary Patient to be assigned to Dr. Terrace Arabia

## 2012-07-28 ENCOUNTER — Other Ambulatory Visit: Payer: BC Managed Care – PPO

## 2012-07-28 DIAGNOSIS — B20 Human immunodeficiency virus [HIV] disease: Secondary | ICD-10-CM

## 2012-07-28 DIAGNOSIS — Z79899 Other long term (current) drug therapy: Secondary | ICD-10-CM

## 2012-07-28 LAB — COMPLETE METABOLIC PANEL WITH GFR
Alkaline Phosphatase: 75 U/L (ref 39–117)
BUN: 15 mg/dL (ref 6–23)
Creat: 1.02 mg/dL (ref 0.50–1.10)
GFR, Est Non African American: 60 mL/min
Glucose, Bld: 82 mg/dL (ref 70–99)
Total Bilirubin: 0.4 mg/dL (ref 0.3–1.2)

## 2012-07-28 LAB — CBC WITH DIFFERENTIAL/PLATELET
Basophils Absolute: 0.1 10*3/uL (ref 0.0–0.1)
HCT: 42.1 % (ref 36.0–46.0)
Hemoglobin: 14.9 g/dL (ref 12.0–15.0)
Lymphocytes Relative: 42 % (ref 12–46)
Monocytes Absolute: 0.4 10*3/uL (ref 0.1–1.0)
Monocytes Relative: 6 % (ref 3–12)
Neutro Abs: 2.8 10*3/uL (ref 1.7–7.7)
Neutrophils Relative %: 48 % (ref 43–77)
WBC: 5.9 10*3/uL (ref 4.0–10.5)

## 2012-07-28 LAB — LIPID PANEL
Cholesterol: 283 mg/dL — ABNORMAL HIGH (ref 0–200)
HDL: 60 mg/dL (ref >39)
LDL Cholesterol: 183 mg/dL — ABNORMAL HIGH (ref 0–99)
Triglycerides: 201 mg/dL — ABNORMAL HIGH (ref <150)
VLDL: 40 mg/dL (ref 0–40)

## 2012-08-11 ENCOUNTER — Encounter: Payer: Self-pay | Admitting: Internal Medicine

## 2012-08-11 ENCOUNTER — Ambulatory Visit (INDEPENDENT_AMBULATORY_CARE_PROVIDER_SITE_OTHER): Payer: BC Managed Care – PPO | Admitting: Internal Medicine

## 2012-08-11 VITALS — BP 119/77 | HR 78 | Temp 97.5°F | Ht 63.0 in | Wt 155.0 lb

## 2012-08-11 DIAGNOSIS — Z0000 Encounter for general adult medical examination without abnormal findings: Secondary | ICD-10-CM

## 2012-08-11 NOTE — Progress Notes (Signed)
[facility] CL[facility] NOTE  RFV: routine Subjective:    Patient ID: [name], female    DOB: Sep 02, 1950, 62 y.o.   MRN: [id number]  HPI [name] is a 62yo F with HIV with HTN, HLD, CD 4 count of 940 (37%) /VL<20 recently switched to stribild. Reports good adherence. Doing well overall. Started to have some diet modification since her husband had a stroke in January 2014.  He is just getting back to work. She is in good health  Current Outpatient Prescriptions on File Prior to Visit  Medication Sig Dispense Refill  . amLODipine (NORVASC) 2.5 MG tablet Take 2.5 mg by mouth daily.        . benazepril (LOTENSIN) 40 MG tablet Take 40 mg by mouth daily.        . cyclobenzaprine (FLEXERIL) 10 MG tablet Take 1 tablet (10 mg total) by mouth at bedtime.  90 tablet  3  . elvitegravir-cobicistat-emtricitabine-tenofovir (STRIBILD) 150-150-200-300 MG TABS Take 1 tablet by mouth daily with breakfast.  90 tablet  3  . estrogens, conjugated, (PREMARIN) 0.625 MG tablet Take 0.625 mg by mouth daily. Take daily for 21 days then do not take for 7 days.       Marland Kitchen gabapentin (NEURONTIN) 100 MG capsule Take 1 capsule (100 mg total) by mouth 4 (four) times daily.  360 capsule  3  . naproxen sodium (ANAPROX) 220 MG tablet Take 440 mg by mouth 2 (two) times daily.       . Omega-3 Fatty Acids (FISH OIL) 1200 MG CAPS Take 1,200 mg by mouth 2 (two) times daily.         No current facility-administered medications on file prior to visit.   Active Ambulatory Problems    Diagnosis Date Noted  . HIV DISEASE 11/08/2005  . HYPERLIPIDEMIA 11/08/2005  . HYPERTENSION 11/08/2005  . URI 12/02/2006  . BRONCHITIS 11/08/2005  . CROHN'S DISEASE 11/08/2005  . SYMPTOM, PAINFUL RESPIRATION 11/08/2005  . Unspecified hereditary and idiopathic peripheral neuropathy 07/16/2012  . Thoracic or lumbosacral neuritis or radiculitis, unspecified 07/16/2012  . Cervicalgia 07/16/2012   Resolved Ambulatory Problems    Diagnosis Date Noted   . No Resolved Ambulatory Problems   Past Medical History  Diagnosis Date  . HIV (human immunodeficiency virus infection)   . Hypertension   . Migraine   . Spinal stenosis   . PONV (postoperative nausea and vomiting)   . Neuropathy    History  Substance Use Topics  . Smoking status: Never Smoker   . Smokeless tobacco: Never Used  . Alcohol Use: 0.6 oz/week    1 Glasses of wine per week     Comment: on weekends  family history includes Cancer in her father; High Cholesterol in her father and mother; and High blood pressure in her father and mother.    Review of Systems  Constitutional: Negative for fever, chills, diaphoresis, activity change, appetite change, fatigue and unexpected weight change.  HENT: Negative for congestion, sore throat, rhinorrhea, sneezing, trouble swallowing and sinus pressure.  Eyes: Negative for photophobia and visual disturbance.  Respiratory: Negative for cough, chest tightness, shortness of breath, wheezing and stridor.  Cardiovascular: Negative for chest pain, palpitations and leg swelling.  Gastrointestinal: Negative for nausea, vomiting, abdominal pain, diarrhea, constipation, blood in stool, abdominal distention and anal bleeding.  Genitourinary: Negative for dysuria, hematuria, flank pain and difficulty urinating.  Musculoskeletal: Negative for myalgias, back pain, joint swelling, arthralgias and gait problem.  Skin: Negative for color change, pallor, rash  and wound.  Neurological: Negative for dizziness, tremors, weakness and light-headedness.  Hematological: Negative for adenopathy. Does not bruise/bleed easily.  Psychiatric/Behavioral: Negative for behavioral problems, confusion, sleep disturbance, dysphoric mood, decreased concentration and agitation.       Objective:   Physical Exam BP 119/77  Pulse 78  Temp(Src) 97.5 F (36.4 C) (Oral)  Ht 5\' 3"  (1.6 m)  Wt 155 lb (70.308 kg)  BMI 27.46 kg/m2  Constitutional: oriented to person,  place, and time. appears well-developed and well-nourished. No distress.  HENT:  Mouth/Throat: Oropharynx is clear and moist. No oropharyngeal exudate. Upper dentures Cardiovascular: Normal rate, regular rhythm and normal heart sounds. Exam reveals no gallop and no friction rub.  No murmur heard.  Pulmonary/Chest: Effort normal and breath sounds normal. No respiratory distress. He has no wheezes.  Abdominal: Soft. Bowel sounds are normal.  exhibits no distension. There is no tenderness.  Lymphadenopathy:  no cervical adenopathy.  Neurological:  alert and oriented to person, place, and time.  Skin: Skin is warm and dry. No rash noted. No erythema.  Psychiatric: normal mood and affect.behavior is normal.      Assessment & Plan:  HIV = continue with stribild  HLD = continue to monitor. Pt not interested in taking crestor, previously had muscle cramping.  Health maintenance = have her come back in 3 months check rpr, ua . Order mammo today.  rtc in  3 month

## 2012-08-11 NOTE — Progress Notes (Signed)
HPI: [name] is a 62 y.o. female here for routine follow-up.  Allergies: No Known Allergies  Vitals: Temp: 97.5 F (36.4 C) (07/15 1103) Temp src: Oral (07/15 1103) BP: 119/77 mmHg (07/15 1103) Pulse Rate: 78 (07/15 1103)  Past Medical History: Past Medical History  Diagnosis Date  . HIV (human immunodeficiency virus infection)   . Hypertension   . Migraine   . Spinal stenosis   . PONV (postoperative nausea and vomiting)     said usually has to have scop patch  . Neuropathy     Social History: History   Social History  . Marital Status: Married    Spouse Name: Peyton Najjar    Number of Children: 1  . Years of Education: 12   Occupational History  . Retired    Social History Main Topics  . Smoking status: Never Smoker   . Smokeless tobacco: Never Used  . Alcohol Use: 0.6 oz/week    1 Glasses of wine per week     Comment: on weekends  . Drug Use: No  . Sexually Active: Yes     Comment: declined condoms   Other Topics Concern  . None   Social History Narrative   Patient lives at home with her husband Peyton Najjar). 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   Caffeine - None    Right handed.      Current Regimen: Stribild  Labs: HIV 1 RNA Quant (copies/mL)  Date Value  07/28/2012 <20   04/23/2012 <20   10/10/2011 <20      CD4 T Cell Abs (cmm)  Date Value  07/28/2012 940   04/23/2012 980   10/10/2011 910      Hep B S Ab (no units)  Date Value  03/24/2006 No      Hepatitis B Surface Ag (no units)  Date Value  03/24/2006 No      HCV Ab (no units)  Date Value  03/24/2006 No     CrCl: Estimated Creatinine Clearance: 55.2 ml/min (by C-G formula based on Cr of 1.02).  Lipids:    Component Value Date/Time   CHOL 283* 07/28/2012 0918   TRIG 201* 07/28/2012 0918   HDL 60 07/28/2012 0918   CHOLHDL 4.7 07/28/2012 0918   VLDL 40 07/28/2012 0918   LDLCALC 183* 07/28/2012 0918    Assessment: Hyperlipidemia - patient meets guideline criteria for  statin initiation.  Upon discussing with her she has been intolerant of statins in the past (muscle pains, memory loss) and does not want statin therapy at this time.  She is disappointed that a healthier diet did not impact her numbers more.  I offered some alternative statin regimens (Crestor 2.5mg  daily, Crestor 5mg  2-3 days/week) but she wants more time to consider.  HIV - adherent with medications.  Reports no missed doses.  Takes Stribild at [employer] with food.  No adverse effects.  Recommendations: Will follow-up with next visit regarding her decision on statin therapy.  Could consider Crestor 2.5mg  daily or 5mg  2-3 days/week.  Madolyn Frieze, PharmD Clinical Infectious Disease Pharmacist [facility] 08/11/2012, 11:26 AM

## 2012-09-22 ENCOUNTER — Ambulatory Visit: Payer: BC Managed Care – PPO

## 2012-10-01 ENCOUNTER — Ambulatory Visit
Admission: RE | Admit: 2012-10-01 | Discharge: 2012-10-01 | Disposition: A | Discharge status: Home / Self Care | Payer: BC Managed Care – PPO | Source: Ambulatory Visit | Attending: Internal Medicine | Admitting: Internal Medicine

## 2012-10-01 DIAGNOSIS — Z0000 Encounter for general adult medical examination without abnormal findings: Secondary | ICD-10-CM

## 2012-11-10 ENCOUNTER — Other Ambulatory Visit: Payer: BC Managed Care – PPO

## 2012-11-10 DIAGNOSIS — Z113 Encounter for screening for infections with a predominantly sexual mode of transmission: Secondary | ICD-10-CM

## 2012-11-10 DIAGNOSIS — B20 Human immunodeficiency virus [HIV] disease: Secondary | ICD-10-CM

## 2012-11-10 LAB — COMPREHENSIVE METABOLIC PANEL
ALT: 20 U/L (ref 0–35)
AST: 24 U/L (ref 0–37)
Albumin: 4.4 g/dL (ref 3.5–5.2)
CO2: 31 mEq/L (ref 19–32)
Calcium: 9.8 mg/dL (ref 8.4–10.5)
Chloride: 102 mEq/L (ref 96–112)
Creat: 1.02 mg/dL (ref 0.50–1.10)
Glucose, Bld: 78 mg/dL (ref 70–99)
Potassium: 4.8 mEq/L (ref 3.5–5.3)
Sodium: 140 mEq/L (ref 135–145)
Total Protein: 6.9 g/dL (ref 6.0–8.3)

## 2012-11-10 LAB — CBC WITH DIFFERENTIAL/PLATELET
Basophils Absolute: 0 10*3/uL (ref 0.0–0.1)
Eosinophils Relative: 4 % (ref 0–5)
HCT: 40.7 % (ref 36.0–46.0)
Lymphocytes Relative: 43 % (ref 12–46)
Lymphs Abs: 2.1 10*3/uL (ref 0.7–4.0)
MCV: 91.5 fL (ref 78.0–100.0)
Monocytes Relative: 8 % (ref 3–12)
Neutro Abs: 2.2 10*3/uL (ref 1.7–7.7)
Platelets: 306 10*3/uL (ref 150–400)
RBC: 4.45 MIL/uL (ref 3.87–5.11)
RDW: 12.6 % (ref 11.5–15.5)
WBC: 4.9 10*3/uL (ref 4.0–10.5)

## 2012-11-11 LAB — HIV-1 RNA QUANT-NO REFLEX-BLD: HIV 1 RNA Quant: <20 copies/mL (ref <20)

## 2012-11-11 LAB — T-HELPER CELL (CD4) - (RCID CLINIC ONLY)
CD4 % Helper T Cell: 37 % (ref 33–55)
CD4 T Cell Abs: 780 /uL (ref 400–2700)

## 2012-11-24 ENCOUNTER — Encounter: Payer: Self-pay | Admitting: Internal Medicine

## 2012-11-24 ENCOUNTER — Ambulatory Visit (INDEPENDENT_AMBULATORY_CARE_PROVIDER_SITE_OTHER): Payer: BC Managed Care – PPO | Admitting: Internal Medicine

## 2012-11-24 VITALS — BP 132/83 | HR 93 | Temp 97.4°F | Wt 151.0 lb

## 2012-11-24 DIAGNOSIS — Z23 Encounter for immunization: Secondary | ICD-10-CM

## 2012-11-24 DIAGNOSIS — B20 Human immunodeficiency virus [HIV] disease: Secondary | ICD-10-CM

## 2012-11-24 NOTE — Progress Notes (Signed)
[facility] NOTE  RFV: routine  Subjective:    Patient ID: [name], female    DOB: 17-May-1950, 62 y.o.   MRN: [id number]  HPI [name] is a 62yo F with HIV, CD 4 count of 780/VL<20 (oct 2014) on stribild x  6months. Tolerating well without missing doses. hiv dx since 20.  Hysterectomy since 1990, had been on premarin, has been weaning off and now finished since beginning of October. Has had some intermittent nightsweats( 2 x in the last month)  Current Outpatient Prescriptions on File Prior to Visit  Medication Sig Dispense Refill  . amLODipine (NORVASC) 2.5 MG tablet Take 2.5 mg by mouth daily.        . benazepril (LOTENSIN) 40 MG tablet Take 40 mg by mouth daily.        . cyclobenzaprine (FLEXERIL) 10 MG tablet Take 1 tablet (10 mg total) by mouth at bedtime.  90 tablet  3  . elvitegravir-cobicistat-emtricitabine-tenofovir (STRIBILD) 150-150-200-300 MG TABS Take 1 tablet by mouth daily with breakfast.  90 tablet  3  . gabapentin (NEURONTIN) 100 MG capsule Take 1 capsule (100 mg total) by mouth 4 (four) times daily.  360 capsule  3  . naproxen sodium (ANAPROX) 220 MG tablet Take 440 mg by mouth 2 (two) times daily.       . Omega-3 Fatty Acids (FISH OIL) 1200 MG CAPS Take 1,200 mg by mouth 2 (two) times daily.         No current facility-administered medications on file prior to visit.   Active Ambulatory Problems    Diagnosis Date Noted  . HIV DISEASE 11/08/2005  . HYPERLIPIDEMIA 11/08/2005  . HYPERTENSION 11/08/2005  . URI 12/02/2006  . BRONCHITIS 11/08/2005  . CROHN'S DISEASE 11/08/2005  . SYMPTOM, PAINFUL RESPIRATION 11/08/2005  . Unspecified hereditary and idiopathic peripheral neuropathy 07/16/2012  . Thoracic or lumbosacral neuritis or radiculitis, unspecified 07/16/2012  . Cervicalgia 07/16/2012   Resolved Ambulatory Problems    Diagnosis Date Noted  . No Resolved Ambulatory Problems   Past Medical History  Diagnosis Date  . HIV (human immunodeficiency  virus infection)   . Hypertension   . Migraine   . Spinal stenosis   . PONV (postoperative nausea and vomiting)   . Neuropathy       Review of Systems 12 point ROS is negative other than having a few nightsweat episodes due to menopause    Objective:   Physical Exam BP 132/83  Pulse 93  Temp(Src) 97.4 F (36.3 C) (Oral)  Wt 151 lb (68.493 kg)  BMI 26.76 kg/m2 Physical Exam  Constitutional: oriented to person, place, and time. appears well-developed and well-nourished. No distress.  HENT:  Mouth/Throat: Oropharynx is clear and moist. No oropharyngeal exudate.  Cardiovascular: Normal rate, regular rhythm and normal heart sounds. Exam reveals no gallop and no friction rub.  No murmur heard.  Pulmonary/Chest: Effort normal and breath sounds normal. No respiratory distress.  no wheezes.  Lymphadenopathy:  no cervical adenopathy.  Neurological:alert and oriented to person, place, and time.  Skin: Skin is warm and dry. No rash noted. No erythema.  Psychiatric:  a normal mood and affect. His behavior is normal.       Assessment & Plan:  HIV = continue stribild  Health maintenance = will get flu today. Mammogram this year. Consider pap?  rtc in 3 months, labs 2 wks prior

## 2013-02-09 ENCOUNTER — Other Ambulatory Visit: Payer: BC Managed Care – PPO

## 2013-02-09 DIAGNOSIS — B20 Human immunodeficiency virus [HIV] disease: Secondary | ICD-10-CM

## 2013-02-09 LAB — COMPREHENSIVE METABOLIC PANEL
ALT: 23 U/L (ref 0–35)
AST: 29 U/L (ref 0–37)
Albumin: 4.4 g/dL (ref 3.5–5.2)
Alkaline Phosphatase: 70 U/L (ref 39–117)
BILIRUBIN TOTAL: 0.4 mg/dL (ref 0.3–1.2)
BUN: 17 mg/dL (ref 6–23)
CALCIUM: 9.5 mg/dL (ref 8.4–10.5)
CHLORIDE: 103 meq/L (ref 96–112)
CO2: 28 mEq/L (ref 19–32)
CREATININE: 0.99 mg/dL (ref 0.50–1.10)
Glucose, Bld: 78 mg/dL (ref 70–99)
Potassium: 4.4 mEq/L (ref 3.5–5.3)
Sodium: 140 mEq/L (ref 135–145)
Total Protein: 6.9 g/dL (ref 6.0–8.3)

## 2013-02-09 LAB — CBC WITH DIFFERENTIAL/PLATELET
BASOS ABS: 0.1 10*3/uL (ref 0.0–0.1)
BASOS PCT: 1 % (ref 0–1)
EOS ABS: 0.2 10*3/uL (ref 0.0–0.7)
Eosinophils Relative: 3 % (ref 0–5)
HEMATOCRIT: 40.9 % (ref 36.0–46.0)
HEMOGLOBIN: 14.4 g/dL (ref 12.0–15.0)
LYMPHS ABS: 2.7 10*3/uL (ref 0.7–4.0)
LYMPHS PCT: 44 % (ref 12–46)
MCH: 32.7 pg (ref 26.0–34.0)
MCHC: 35.2 g/dL (ref 30.0–36.0)
MCV: 92.7 fL (ref 78.0–100.0)
MONO ABS: 0.5 10*3/uL (ref 0.1–1.0)
MONOS PCT: 8 % (ref 3–12)
Neutro Abs: 2.7 10*3/uL (ref 1.7–7.7)
Neutrophils Relative %: 44 % (ref 43–77)
Platelets: 324 10*3/uL (ref 150–400)
RBC: 4.41 MIL/uL (ref 3.87–5.11)
RDW: 13.1 % (ref 11.5–15.5)
WBC: 6.1 10*3/uL (ref 4.0–10.5)

## 2013-02-10 ENCOUNTER — Other Ambulatory Visit: Payer: BC Managed Care – PPO

## 2013-02-10 LAB — HIV-1 RNA QUANT-NO REFLEX-BLD: HIV 1 RNA Quant: <20 copies/mL (ref <20)

## 2013-02-10 LAB — T-HELPER CELL (CD4) - (RCID CLINIC ONLY)
CD4 % Helper T Cell: 39 % (ref 33–55)
CD4 T CELL ABS: 1150 /uL (ref 400–2700)

## 2013-02-25 ENCOUNTER — Ambulatory Visit: Payer: BC Managed Care – PPO | Admitting: Internal Medicine

## 2013-03-23 ENCOUNTER — Ambulatory Visit: Payer: BC Managed Care – PPO | Admitting: Internal Medicine

## 2013-04-01 ENCOUNTER — Ambulatory Visit (INDEPENDENT_AMBULATORY_CARE_PROVIDER_SITE_OTHER): Payer: BC Managed Care – PPO | Admitting: Internal Medicine

## 2013-04-01 VITALS — BP 153/80 | HR 94 | Temp 97.8°F | Wt 155.0 lb

## 2013-04-01 DIAGNOSIS — E781 Pure hyperglyceridemia: Secondary | ICD-10-CM

## 2013-04-01 DIAGNOSIS — B20 Human immunodeficiency virus [HIV] disease: Secondary | ICD-10-CM

## 2013-04-01 DIAGNOSIS — Z0000 Encounter for general adult medical examination without abnormal findings: Secondary | ICD-10-CM

## 2013-04-01 NOTE — Progress Notes (Signed)
Subjective:    Patient ID: [name], female    DOB: Jun 21, 1950, 63 y.o.   MRN: [id number]  HPI 63yo F with HIv, CD 4 count of 1150/VL<20 on stribild. Doing well with adherence  Current Outpatient Prescriptions on File Prior to Visit  Medication Sig Dispense Refill  . benazepril (LOTENSIN) 40 MG tablet Take 40 mg by mouth daily.        . cyclobenzaprine (FLEXERIL) 10 MG tablet Take 1 tablet (10 mg total) by mouth at bedtime.  90 tablet  3  . elvitegravir-cobicistat-emtricitabine-tenofovir (STRIBILD) 150-150-200-300 MG TABS Take 1 tablet by mouth daily with breakfast.  90 tablet  3  . gabapentin (NEURONTIN) 100 MG capsule Take 1 capsule (100 mg total) by mouth 4 (four) times daily.  360 capsule  3  . naproxen sodium (ANAPROX) 220 MG tablet Take 440 mg by mouth 2 (two) times daily.       . Omega-3 Fatty Acids (FISH OIL) 1200 MG CAPS Take 1,200 mg by mouth 2 (two) times daily.         No current facility-administered medications on file prior to visit.   Active Ambulatory Problems    Diagnosis Date Noted  . HIV DISEASE 11/08/2005  . HYPERLIPIDEMIA 11/08/2005  . HYPERTENSION 11/08/2005  . URI 12/02/2006  . BRONCHITIS 11/08/2005  . CROHN'S DISEASE 11/08/2005  . SYMPTOM, PAINFUL RESPIRATION 11/08/2005  . Unspecified hereditary and idiopathic peripheral neuropathy 07/16/2012  . Thoracic or lumbosacral neuritis or radiculitis, unspecified 07/16/2012  . Cervicalgia 07/16/2012   Resolved Ambulatory Problems    Diagnosis Date Noted  . No Resolved Ambulatory Problems   Past Medical History  Diagnosis Date  . HIV (human immunodeficiency virus infection)   . Hypertension   . Migraine   . Spinal stenosis   . PONV (postoperative nausea and vomiting)   . Neuropathy      Review of Systems Review of Systems  Constitutional: Negative for fever, chills, diaphoresis, activity change, appetite change, fatigue and unexpected weight change.  HENT: Negative for congestion, sore  throat, rhinorrhea, sneezing, trouble swallowing and sinus pressure.  Eyes: Negative for photophobia and visual disturbance.  Respiratory: Negative for cough, chest tightness, shortness of breath, wheezing and stridor.  Cardiovascular: Negative for chest pain, palpitations and leg swelling.  Gastrointestinal: Negative for nausea, vomiting, abdominal pain, diarrhea, constipation, blood in stool, abdominal distention and anal bleeding.  Genitourinary: Negative for dysuria, hematuria, flank pain and difficulty urinating.  Musculoskeletal: Negative for myalgias, back pain, joint swelling, arthralgias and gait problem.  Skin: Negative for color change, pallor, rash and wound.  Neurological: Negative for dizziness, tremors, weakness and light-headedness.  Hematological: Negative for adenopathy. Does not bruise/bleed easily.  Psychiatric/Behavioral: Negative for behavioral problems, confusion, sleep disturbance, dysphoric mood, decreased concentration and agitation.       Objective:   Physical Exam BP 153/80  Pulse 94  Temp(Src) 97.8 F (36.6 C) (Oral)  Wt 155 lb (70.308 kg) Physical Exam  Constitutional:  oriented to person, place, and time. appears well-developed and well-nourished. No distress.  HENT:  Mouth/Throat: Oropharynx is clear and moist. No oropharyngeal exudate.  Cardiovascular: Normal rate, regular rhythm and normal heart sounds. Exam reveals no gallop and no friction rub.  No murmur heard.  Pulmonary/Chest: Effort normal and breath sounds normal. No respiratory distress.  has no wheezes.  Abdominal: Soft. Bowel sounds are normal.  exhibits no distension. There is no tenderness.  Lymphadenopathy: no cervical adenopathy.  Neurological: alert and oriented to person, place,  and time.  Skin: Skin is warm and dry. No rash noted. No erythema.  Psychiatric: a normal mood and affect. His behavior is normal.         Assessment & Plan:  hiv = well controlled, continue on stribild.  Will do labs in 3 months  Health maintenance = will check lipids in 3 months. Colonoscopy due in 5 yrs  Hyper tg = increase to 3 caps per day if tolerated

## 2013-04-05 ENCOUNTER — Telehealth: Payer: Self-pay | Admitting: *Deleted

## 2013-04-05 NOTE — Telephone Encounter (Signed)
Emailed copy of dos 11-24-12 to [employer] to add dx 042 and resubmit charge to [facility].

## 2013-05-09 ENCOUNTER — Other Ambulatory Visit: Payer: Self-pay | Admitting: Infectious Diseases

## 2013-06-29 ENCOUNTER — Other Ambulatory Visit: Payer: BC Managed Care – PPO

## 2013-06-29 DIAGNOSIS — B20 Human immunodeficiency virus [HIV] disease: Secondary | ICD-10-CM

## 2013-06-29 LAB — COMPREHENSIVE METABOLIC PANEL
ALBUMIN: 4.4 g/dL (ref 3.5–5.2)
ALK PHOS: 68 U/L (ref 39–117)
ALT: 22 U/L (ref 0–35)
AST: 28 U/L (ref 0–37)
BUN: 14 mg/dL (ref 6–23)
CALCIUM: 9.4 mg/dL (ref 8.4–10.5)
CO2: 28 mEq/L (ref 19–32)
Chloride: 102 mEq/L (ref 96–112)
Creat: 1.18 mg/dL — ABNORMAL HIGH (ref 0.50–1.10)
Glucose, Bld: 73 mg/dL (ref 70–99)
POTASSIUM: 4.2 meq/L (ref 3.5–5.3)
Sodium: 138 mEq/L (ref 135–145)
Total Bilirubin: 0.4 mg/dL (ref 0.2–1.2)
Total Protein: 6.5 g/dL (ref 6.0–8.3)

## 2013-06-29 LAB — CBC WITH DIFFERENTIAL/PLATELET
Basophils Absolute: 0.1 10*3/uL (ref 0.0–0.1)
Basophils Relative: 1 % (ref 0–1)
Eosinophils Absolute: 0.2 10*3/uL (ref 0.0–0.7)
Eosinophils Relative: 3 % (ref 0–5)
HCT: 38.6 % (ref 36.0–46.0)
HEMOGLOBIN: 13.7 g/dL (ref 12.0–15.0)
LYMPHS ABS: 2.4 10*3/uL (ref 0.7–4.0)
LYMPHS PCT: 40 % (ref 12–46)
MCH: 33.1 pg (ref 26.0–34.0)
MCHC: 35.5 g/dL (ref 30.0–36.0)
MCV: 93.2 fL (ref 78.0–100.0)
Monocytes Absolute: 0.5 10*3/uL (ref 0.1–1.0)
Monocytes Relative: 9 % (ref 3–12)
NEUTROS PCT: 47 % (ref 43–77)
Neutro Abs: 2.9 10*3/uL (ref 1.7–7.7)
Platelets: 297 10*3/uL (ref 150–400)
RBC: 4.14 MIL/uL (ref 3.87–5.11)
RDW: 13.1 % (ref 11.5–15.5)
WBC: 6.1 10*3/uL (ref 4.0–10.5)

## 2013-06-30 LAB — T-HELPER CELL (CD4) - (RCID CLINIC ONLY)
CD4 % Helper T Cell: 36 % (ref 33–55)
CD4 T Cell Abs: 940 /uL (ref 400–2700)

## 2013-06-30 LAB — HIV-1 RNA QUANT-NO REFLEX-BLD: HIV-1 RNA Quant, Log: <1.3 {Log} (ref <1.30)

## 2013-07-15 ENCOUNTER — Ambulatory Visit (INDEPENDENT_AMBULATORY_CARE_PROVIDER_SITE_OTHER): Payer: BC Managed Care – PPO | Admitting: Internal Medicine

## 2013-07-15 ENCOUNTER — Encounter: Payer: Self-pay | Admitting: Internal Medicine

## 2013-07-15 VITALS — BP 125/80 | HR 83 | Temp 97.1°F | Wt 151.0 lb

## 2013-07-15 DIAGNOSIS — B20 Human immunodeficiency virus [HIV] disease: Secondary | ICD-10-CM | POA: Diagnosis not present

## 2013-07-15 DIAGNOSIS — Z23 Encounter for immunization: Secondary | ICD-10-CM

## 2013-07-15 NOTE — Progress Notes (Signed)
   Subjective:    Patient ID: [name], female    DOB: 04-14-1950, 63 y.o.   MRN: [id number]  HPI 63 yo F with HIV, Cd 4 count of 940/VL<20 on stribild. Also hyper TG, HTN. No health problem. No longer having nightsweats since discontinuing premarin. Doing well overall. Would like copy of labs  Non-immune hep B  Current Outpatient Prescriptions on File Prior to Visit  Medication Sig Dispense Refill  . amLODipine (NORVASC) 5 MG tablet Take 5 mg by mouth daily.      . benazepril (LOTENSIN) 40 MG tablet Take 40 mg by mouth daily.        . cyclobenzaprine (FLEXERIL) 10 MG tablet Take 1 tablet (10 mg total) by mouth at bedtime.  90 tablet  3  . gabapentin (NEURONTIN) 100 MG capsule Take 1 capsule (100 mg total) by mouth 4 (four) times daily.  360 capsule  3  . naproxen sodium (ANAPROX) 220 MG tablet Take 440 mg by mouth 2 (two) times daily.       . Omega-3 Fatty Acids (FISH OIL) 1200 MG CAPS Take 1,200 mg by mouth 2 (two) times daily.        . STRIBILD 150-150-200-300 MG TABS tablet TAKE 1 TABLET DAILY WITH BREAKFAST  3 tablet  2   No current facility-administered medications on file prior to visit.   Active Ambulatory Problems    Diagnosis Date Noted  . HIV DISEASE 11/08/2005  . HYPERLIPIDEMIA 11/08/2005  . HYPERTENSION 11/08/2005  . URI 12/02/2006  . BRONCHITIS 11/08/2005  . CROHN'S DISEASE 11/08/2005  . SYMPTOM, PAINFUL RESPIRATION 11/08/2005  . Unspecified hereditary and idiopathic peripheral neuropathy 07/16/2012  . Thoracic or lumbosacral neuritis or radiculitis, unspecified 07/16/2012  . Cervicalgia 07/16/2012   Resolved Ambulatory Problems    Diagnosis Date Noted  . No Resolved Ambulatory Problems   Past Medical History  Diagnosis Date  . HIV (human immunodeficiency virus infection)   . Hypertension   . Migraine   . Spinal stenosis   . PONV (postoperative nausea and vomiting)   . Neuropathy       Review of Systems 10 point ros is negative    Objective:     Physical Exam BP 125/80  Pulse 83  Temp(Src) 97.1 F (36.2 C) (Oral)  Wt 151 lb (68.493 kg) Physical Exam  Constitutional:  oriented to person, place, and time. appears well-developed and well-nourished. No distress.  HENT:  Mouth/Throat: Oropharynx is clear and moist. No oropharyngeal exudate.  Cardiovascular: Normal rate, regular rhythm and normal heart sounds. Exam reveals no gallop and no friction rub.  No murmur heard.  Pulmonary/Chest: Effort normal and breath sounds normal. No respiratory distress.  has no wheezes.  Lymphadenopathy: no cervical adenopathy.  Skin: Skin is warm and dry. No rash noted. Scattered abrasions well healed from mowing lawn Psychiatric: a normal mood and affect.  behavior is normal.       Assessment & Plan:  HIV = well controlled on stribild. Will give copies of labs  Hyper TG = continue on current regimen  Health maintenace = will re do hep B series. mammo needed in the Fall. Has hysterectomy. Can arrnage mammo with pcp as well  rtc in 3 months, but 1 month for hep b #2

## 2013-07-16 ENCOUNTER — Ambulatory Visit: Payer: BC Managed Care – PPO | Admitting: Nurse Practitioner

## 2013-07-28 ENCOUNTER — Other Ambulatory Visit: Payer: Self-pay | Admitting: Nurse Practitioner

## 2013-08-16 ENCOUNTER — Ambulatory Visit (INDEPENDENT_AMBULATORY_CARE_PROVIDER_SITE_OTHER): Payer: BC Managed Care – PPO | Admitting: *Deleted

## 2013-08-16 DIAGNOSIS — Z23 Encounter for immunization: Secondary | ICD-10-CM

## 2013-08-16 DIAGNOSIS — B20 Human immunodeficiency virus [HIV] disease: Secondary | ICD-10-CM

## 2013-09-14 ENCOUNTER — Ambulatory Visit (INDEPENDENT_AMBULATORY_CARE_PROVIDER_SITE_OTHER): Payer: BC Managed Care – PPO | Admitting: Nurse Practitioner

## 2013-09-14 ENCOUNTER — Encounter: Payer: Self-pay | Admitting: Nurse Practitioner

## 2013-09-14 VITALS — BP 136/75 | HR 88 | Ht 63.0 in | Wt 149.2 lb

## 2013-09-14 DIAGNOSIS — M542 Cervicalgia: Secondary | ICD-10-CM

## 2013-09-14 DIAGNOSIS — G609 Hereditary and idiopathic neuropathy, unspecified: Secondary | ICD-10-CM

## 2013-09-14 DIAGNOSIS — IMO0002 Reserved for concepts with insufficient information to code with codable children: Secondary | ICD-10-CM

## 2013-09-14 MED ORDER — GABAPENTIN 100 MG PO CAPS: 100.0000 mg | ORAL_CAPSULE | Freq: Four times a day (QID) | ORAL | Status: DC

## 2013-09-14 MED ORDER — CYCLOBENZAPRINE HCL 10 MG PO TABS: 10.0000 mg | ORAL_TABLET | Freq: Every day | ORAL | Status: DC

## 2013-09-14 NOTE — Progress Notes (Signed)
[facility]  PATIENT: [name] DOB: 11-18-1950   REASON FOR VISIT: follow up for headaches, neck pain   HISTORY OF PRESENT ILLNESS: [name], 63 year old female returns for followup. She was last seen in this office 07/16/2012. Headaches are well controlled on gabapentin and her neck pain is well-controlled on Flexeril at bedtime only. She continues to do neck exercises. She had cervical fusion 1989. She denies any further bouts of vertigo,  she has no new neurologic complaints. She returns for routine yearly evaluation and to refill her medications  HISTORY:Patient returns for followup after last visit 01/16/2012. She was originally evaluated by Dr. Erling Cruz 03/21/09. She has a history of HIV infection since 1992 contracted from her first husband She's been followed by Dr. Lars Mage, infectious disease specialist in [place] for many years. 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 She has a history of arthritis and underwent carpal tunnel syndrome surgery to both hands by Dr.Nudelman in 2006 and underwent C5-C6 anterior cervical fusion by Dr.Botero in 1989. She's had a history of lower back pain followed by Dr. Durward Fortes with epidural steroid injections. Following one set of epidural steroid injections she developed a low serum sodium of 120 requiring hospitalization.Dr. Erling Cruz saw her 04/15/2007 with proximal upper extremity weakness and lower extremity weakness most likely secondary to myopathy. EMG/NCV was normal and her symptoms improved with time. Blood studies included angiotensin-converting, ANA, CPK, sedimentation rate, RPR, serum protein electrophoresis, hemoglobin A1c, fasting glucose,TSH, and B12 which were normal 04/16/07. Since November 2010 she reports a nagging headache occurring at the base of her skull extending toward her ears bilaterally and also making her scalp "sore to touch". She was seen by Ear, Nose,and Throat physicians and  placed on courses of exercises which improved, but did not relieve her symptoms. For her headaches and neck pain.,she's been seen by her family practice doctor, an orthopedist Dr. Durward Fortes, and Dr.Nudelman, a neurosurgeon. She says with her headaches she has difficulty with bright lights and loud noises.She notes dry eyes. She has a history of migraine headaches in her 26s. She underwent an MRI study of the cervical spine without contrast which revealed (2010) increased spondylosis at C3-4  and C6 -7 and mild cord deformities at both levels, greater at C 6-7. At C4-5 she has spondylosis with central cord indented by disc. She underwent MRI Brain study 02/18/2009 with and without contrast showing bilateral white matter hyperintense lesions. These were nonspecific. Has been seen by Dr. Katy Fitch and placed on eyedrops.She was started on Flexeril and Gabapentin by Dr. Erling Cruz and her symptoms have greatly improved, since April 2011.  She returns today for followup and her headaches are in control with gabapentin and Flexeril. She has not had further bouts of vertigo. She is doing her vestibular exercises. She has no new neurologic complaints    REVIEW OF SYSTEMS: Full 14 system review of systems performed and notable only for those listed, all others are neg:  Constitutional: N/A  Cardiovascular: N/A  Ear/Nose/Throat: N/A  Skin: N/A  Eyes: N/A  Respiratory: N/A  Gastroitestinal: N/A  Hematology/Lymphatic: N/A  Endocrine: N/A Musculoskeletal: Joint pain Allergy/Immunology: Environmental allergies Neurological: Rare headache  Psychiatric: N/A Sleep : NA   ALLERGIES: No Known Allergies  HOME MEDICATIONS: Outpatient Prescriptions Prior to Visit  Medication Sig Dispense Refill  . amLODipine (NORVASC) 5 MG tablet Take 5 mg by mouth daily.      . benazepril (LOTENSIN) 40 MG tablet Take 40 mg by  mouth daily.        . cyclobenzaprine (FLEXERIL) 10 MG tablet TAKE 1 TABLET AT BEDTIME  90 tablet  0  .  gabapentin (NEURONTIN) 100 MG capsule TAKE 1 CAPSULE FOUR TIMES A DAY  360 capsule  0  . naproxen sodium (ANAPROX) 220 MG tablet Take 440 mg by mouth 2 (two) times daily.       . Omega-3 Fatty Acids (FISH OIL) 1200 MG CAPS Take 1,200 mg by mouth 2 (two) times daily.        . STRIBILD 150-150-200-300 MG TABS tablet TAKE 1 TABLET DAILY WITH BREAKFAST  3 tablet  2   No facility-administered medications prior to visit.    PAST MEDICAL HISTORY: Past Medical History  Diagnosis Date  . HIV (human immunodeficiency virus infection)   . Hypertension   . Migraine   . Spinal stenosis   . PONV (postoperative nausea and vomiting)     said usually has to have scop patch  . Neuropathy     PAST SURGICAL HISTORY: Past Surgical History  Procedure Laterality Date  . Cervical fusion  1989  . Abdominal hysterectomy  1990  . Tonsillectomy    . Carpal tunnel release      both hands    FAMILY HISTORY: Family History  Problem Relation Age of Onset  . Cancer Father   . High blood pressure Mother   . High blood pressure Father   . High Cholesterol Mother   . High Cholesterol Father     SOCIAL HISTORY: History   Social History  . Marital Status: Married    Spouse Name: [name]    Number of Children: 1  . Years of Education: 12   Occupational History  . Retired    Social History Main Topics  . Smoking status: Never Smoker   . Smokeless tobacco: Never Used  . Alcohol Use: 0.6 oz/week    1 Glasses of wine per week     Comment: on weekends  . Drug Use: No  . Sexual Activity: Yes     Comment: declined condoms   Other Topics Concern  . Not on file   Social History Narrative   Patient lives at home with her husband [name]). 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   Caffeine - None    Right handed.       PHYSICAL EXAM  Filed Vitals:   09/14/13 0911  BP: 136/75  Pulse: 88  Height: 5\' 3"  (1.6 m)  Weight: 149 lb 3.2 oz (67.677 kg)   Body mass index is 26.44  kg/(m^2). General: well developed, well nourished, seated, in no evident distress  Head: head normocephalic and atraumatic. Oropharynx benign  Neck: Neck flexion and extension maneuvers with mild restriction , no carotid bruits  Neurologic Exam  Mental Status: Awake and fully alert. Oriented to place and time. Follows all commands. Speech and language normal.  Cranial Nerves: Pupils equal, briskly reactive to light. Extraocular movements full without nystagmus. Visual fields full to confrontation. Hearing intact and symmetric to finger snap. Facial sensation intact. Face, tongue, palate move normally and symmetrically. Neck flexion and extension normal.  Motor: Normal bulk and tone. Normal strength in all tested extremity muscles.No focal weakness  Sensory.: intact to touch and pinprick and vibratory.  Coordination: Rapid alternating movements normal in all extremities. Finger-to-nose and heel-to-shin performed accurately bilaterally.  Gait and Station: Arises from chair without difficulty. Stance is normal. Gait demonstrates normal stride length and balance .  Able to heel, toe and tandem walk without difficulty. Romberg negative  Reflexes: 2+ and symmetric. Toes downgoing.   DIAGNOSTIC DATA (LABS, IMAGING, TESTING) - I reviewed patient records, labs, notes, testing and imaging myself where available.  Lab Results  Component Value Date   WBC 6.1 06/29/2013   HGB 13.7 06/29/2013   HCT 38.6 06/29/2013   MCV 93.2 06/29/2013   PLT 297 06/29/2013      Component Value Date/Time   NA 138 06/29/2013 1105   K 4.2 06/29/2013 1105   CL 102 06/29/2013 1105   CO2 28 06/29/2013 1105   GLUCOSE 73 06/29/2013 1105   BUN 14 06/29/2013 1105   CREATININE 1.18* 06/29/2013 1105   CREATININE 0.83 11/06/2011 1100   CALCIUM 9.4 06/29/2013 1105   PROT 6.5 06/29/2013 1105   ALBUMIN 4.4 06/29/2013 1105   AST 28 06/29/2013 1105   ALT 22 06/29/2013 1105   ALKPHOS 68 06/29/2013 1105   BILITOT 0.4 06/29/2013 1105   GFRNONAA 60 07/28/2012 0918    GFRNONAA 76* 11/06/2011 1100   GFRAA 69 07/28/2012 0918   GFRAA 88* 11/06/2011 1100   Lab Results  Component Value Date   CHOL 283* 07/28/2012   HDL 60 07/28/2012   LDLCALC 183* 07/28/2012   TRIG 201* 07/28/2012   CHOLHDL 4.7 07/28/2012    ASSESSMENT AND PLAN  63 y.o. year old female  has a past medical history of HIV (human immunodeficiency virus infection); Hypertension; Migraine; Spinal stenosis; and Neuropathy. here to followup. Her symptoms are currently well controlled on gabapentin and Flexeril.  Continue gabapentin 2 to  4 times daily as necessary Continue Flexeril at night Followup yearly and when necessary Dennie Bible, [facility], [facility], APRN  [facility] [address] [place], [place] [address] [phone]

## 2013-09-14 NOTE — Patient Instructions (Signed)
Continue gabapentin 2 to  4 times daily as necessary Continue Flexeril at night Followup yearly and when necessary(

## 2013-10-05 ENCOUNTER — Other Ambulatory Visit (INDEPENDENT_AMBULATORY_CARE_PROVIDER_SITE_OTHER): Payer: BLUE CROSS/BLUE SHIELD

## 2013-10-05 DIAGNOSIS — B20 Human immunodeficiency virus [HIV] disease: Secondary | ICD-10-CM

## 2013-10-05 DIAGNOSIS — Z79899 Other long term (current) drug therapy: Secondary | ICD-10-CM

## 2013-10-05 DIAGNOSIS — Z113 Encounter for screening for infections with a predominantly sexual mode of transmission: Secondary | ICD-10-CM

## 2013-10-05 LAB — RPR

## 2013-10-05 LAB — CBC WITH DIFFERENTIAL/PLATELET
Basophils Absolute: 0.1 10*3/uL (ref 0.0–0.1)
Basophils Relative: 1 % (ref 0–1)
Eosinophils Absolute: 0.3 10*3/uL (ref 0.0–0.7)
Eosinophils Relative: 5 % (ref 0–5)
HCT: 38.2 % (ref 36.0–46.0)
HEMOGLOBIN: 13.4 g/dL (ref 12.0–15.0)
Lymphocytes Relative: 41 % (ref 12–46)
Lymphs Abs: 2.3 10*3/uL (ref 0.7–4.0)
MCH: 32.6 pg (ref 26.0–34.0)
MCHC: 35.1 g/dL (ref 30.0–36.0)
MCV: 92.9 fL (ref 78.0–100.0)
MONOS PCT: 8 % (ref 3–12)
Monocytes Absolute: 0.4 10*3/uL (ref 0.1–1.0)
Neutro Abs: 2.5 10*3/uL (ref 1.7–7.7)
Neutrophils Relative %: 45 % (ref 43–77)
PLATELETS: 327 10*3/uL (ref 150–400)
RBC: 4.11 MIL/uL (ref 3.87–5.11)
RDW: 13 % (ref 11.5–15.5)
WBC: 5.5 10*3/uL (ref 4.0–10.5)

## 2013-10-05 LAB — LIPID PANEL
Cholesterol: 281 mg/dL — ABNORMAL HIGH (ref 0–200)
HDL: 59 mg/dL (ref >39)
LDL Cholesterol: 195 mg/dL — ABNORMAL HIGH (ref 0–99)
Total CHOL/HDL Ratio: 4.8 Ratio
Triglycerides: 133 mg/dL (ref <150)
VLDL: 27 mg/dL (ref 0–40)

## 2013-10-05 LAB — COMPLETE METABOLIC PANEL WITH GFR
ALBUMIN: 4.6 g/dL (ref 3.5–5.2)
ALT: 18 U/L (ref 0–35)
AST: 27 U/L (ref 0–37)
Alkaline Phosphatase: 77 U/L (ref 39–117)
BUN: 16 mg/dL (ref 6–23)
CALCIUM: 9.7 mg/dL (ref 8.4–10.5)
CHLORIDE: 104 meq/L (ref 96–112)
CO2: 30 meq/L (ref 19–32)
Creat: 1.3 mg/dL — ABNORMAL HIGH (ref 0.50–1.10)
GFR, EST AFRICAN AMERICAN: 51 mL/min — AB
GFR, Est Non African American: 44 mL/min — ABNORMAL LOW
Glucose, Bld: 75 mg/dL (ref 70–99)
POTASSIUM: 4.6 meq/L (ref 3.5–5.3)
SODIUM: 140 meq/L (ref 135–145)
TOTAL PROTEIN: 6.9 g/dL (ref 6.0–8.3)
Total Bilirubin: 0.5 mg/dL (ref 0.2–1.2)

## 2013-10-06 LAB — T-HELPER CELL (CD4) - (RCID CLINIC ONLY)
CD4 % Helper T Cell: 36 % (ref 33–55)
CD4 T Cell Abs: 810 /uL (ref 400–2700)

## 2013-10-06 LAB — HIV-1 RNA QUANT-NO REFLEX-BLD

## 2013-10-18 ENCOUNTER — Ambulatory Visit (INDEPENDENT_AMBULATORY_CARE_PROVIDER_SITE_OTHER): Payer: BLUE CROSS/BLUE SHIELD | Admitting: Internal Medicine

## 2013-10-18 ENCOUNTER — Encounter: Payer: Self-pay | Admitting: Internal Medicine

## 2013-10-18 VITALS — BP 136/84 | HR 82 | Temp 97.2°F | Wt 147.0 lb

## 2013-10-18 DIAGNOSIS — E785 Hyperlipidemia, unspecified: Secondary | ICD-10-CM | POA: Diagnosis not present

## 2013-10-18 DIAGNOSIS — B20 Human immunodeficiency virus [HIV] disease: Secondary | ICD-10-CM | POA: Diagnosis not present

## 2013-10-18 NOTE — Progress Notes (Signed)
Patient ID: [name], female   DOB: [birth year], 63 y.o.   MRN: [id number]       Patient ID: [name], female   DOB: 10/07/1950, 63 y.o.   MRN: [id number]  HPI  63yo F with HIV disease, well controlled, CD 4 count of 810/VL<20 on stribild. Doing well with meds. Has good diet. No complaints with health since last visit.  Outpatient Encounter Prescriptions as of 10/18/2013  Medication Sig  . amLODipine (NORVASC) 5 MG tablet Take 5 mg by mouth daily.  . benazepril (LOTENSIN) 40 MG tablet Take 40 mg by mouth daily.    . cyclobenzaprine (FLEXERIL) 10 MG tablet Take 1 tablet (10 mg total) by mouth at bedtime.  . gabapentin (NEURONTIN) 100 MG capsule Take 1 capsule (100 mg total) by mouth 4 (four) times daily.  . naproxen sodium (ANAPROX) 220 MG tablet Take 440 mg by mouth 2 (two) times daily.   . Omega-3 Fatty Acids (FISH OIL) 1200 MG CAPS Take 1,200 mg by mouth 2 (two) times daily.    . STRIBILD 150-150-200-300 MG TABS tablet TAKE 1 TABLET DAILY WITH BREAKFAST     Patient Active Problem List   Diagnosis Date Noted  . Unspecified hereditary and idiopathic peripheral neuropathy 07/16/2012  . Thoracic or lumbosacral neuritis or radiculitis, unspecified 07/16/2012  . Cervicalgia 07/16/2012  . URI 12/02/2006  . HIV DISEASE 11/08/2005  . HYPERLIPIDEMIA 11/08/2005  . HYPERTENSION 11/08/2005  . BRONCHITIS 11/08/2005  . CROHN'S DISEASE 11/08/2005  . SYMPTOM, PAINFUL RESPIRATION 11/08/2005     Health Maintenance Due  Topic Date Due  . Colonoscopy  11/20/2001  . Pap Smear  12/17/2010  . Zostavax  11/21/2011  . Influenza Vaccine  08/28/2013     Review of Systems  Constitutional: Negative for fever, chills, diaphoresis, activity change, appetite change, fatigue and unexpected weight change.  HENT: Negative for congestion, sore throat, rhinorrhea, sneezing, trouble swallowing and sinus pressure.  Eyes: Negative for photophobia and visual disturbance.  Respiratory: Negative  for cough, chest tightness, shortness of breath, wheezing and stridor.  Cardiovascular: Negative for chest pain, palpitations and leg swelling.  Gastrointestinal: Negative for nausea, vomiting, abdominal pain, diarrhea, constipation, blood in stool, abdominal distention and anal bleeding.  Genitourinary: Negative for dysuria, hematuria, flank pain and difficulty urinating.  Musculoskeletal: Negative for myalgias, back pain, joint swelling, arthralgias and gait problem.  Skin: Negative for color change, pallor, rash and wound.  Neurological: Negative for dizziness, tremors, weakness and light-headedness.  Hematological: Negative for adenopathy. Does not bruise/bleed easily.  Psychiatric/Behavioral: Negative for behavioral problems, confusion, sleep disturbance, dysphoric mood, decreased concentration and agitation.    Physical Exam   BP 136/84  Pulse 82  Temp(Src) 97.2 F (36.2 C) (Oral)  Wt 147 lb (66.679 kg) Physical Exam  Constitutional:  oriented to person, place, and time. appears well-developed and well-nourished. No distress.  HENT:  Mouth/Throat: Oropharynx is clear and moist. No oropharyngeal exudate.  Cardiovascular: Normal rate, regular rhythm and normal heart sounds. Exam reveals no gallop and no friction rub.  No murmur heard.  Pulmonary/Chest: Effort normal and breath sounds normal. No respiratory distress.  has no wheezes.  Abdominal: Soft. Bowel sounds are normal.  exhibits no distension. There is no tenderness.  Lymphadenopathy: no cervical adenopathy.  Neurological: alert and oriented to person, place, and time.  Skin: Skin is warm and dry. No rash noted. No erythema.  Psychiatric: a normal mood and affect. His behavior is normal.   Lab Results  Component  Value Date   CD4TCELL 36 10/05/2013   Lab Results  Component Value Date   CD4TABS 810 10/05/2013   CD4TABS 940 06/29/2013   CD4TABS 1150 02/09/2013   Lab Results  Component Value Date   HIV1RNAQUANT <20 10/05/2013     Lab Results  Component Value Date   HEPBSAB No 03/24/2006   No results found for this basename: RPR    CBC Lab Results  Component Value Date   WBC 5.5 10/05/2013   RBC 4.11 10/05/2013   HGB 13.4 10/05/2013   HCT 38.2 10/05/2013   PLT 327 10/05/2013   MCV 92.9 10/05/2013   MCH 32.6 10/05/2013   MCHC 35.1 10/05/2013   RDW 13.0 10/05/2013   LYMPHSABS 2.3 10/05/2013   MONOABS 0.4 10/05/2013   EOSABS 0.3 10/05/2013   BASOSABS 0.1 10/05/2013   BMET Lab Results  Component Value Date   NA 140 10/05/2013   K 4.6 10/05/2013   CL 104 10/05/2013   CO2 30 10/05/2013   GLUCOSE 75 10/05/2013   BUN 16 10/05/2013   CREATININE 1.30* 10/05/2013   CALCIUM 9.7 10/05/2013   GFRNONAA 44* 10/05/2013   GFRAA 51* 10/05/2013     Assessment and Plan   hiv = well controlled on stribild. Will return in 6 months  Health maintenance = will get flu vaccine in 2 wk at clinic, she is setting up mammo appt for the fall  Hyperlipidemia = wants to do diet modiffication

## 2013-10-19 ENCOUNTER — Other Ambulatory Visit: Payer: Self-pay

## 2013-10-19 DIAGNOSIS — Z1231 Encounter for screening mammogram for malignant neoplasm of breast: Secondary | ICD-10-CM

## 2013-11-10 ENCOUNTER — Ambulatory Visit: Payer: BC Managed Care – PPO

## 2013-11-15 ENCOUNTER — Ambulatory Visit: Payer: BC Managed Care – PPO

## 2013-11-15 DIAGNOSIS — Z23 Encounter for immunization: Secondary | ICD-10-CM

## 2013-12-07 ENCOUNTER — Ambulatory Visit
Admission: RE | Admit: 2013-12-07 | Discharge: 2013-12-07 | Disposition: A | Discharge status: Home / Self Care | Payer: BC Managed Care – PPO | Source: Ambulatory Visit

## 2013-12-07 ENCOUNTER — Encounter (INDEPENDENT_AMBULATORY_CARE_PROVIDER_SITE_OTHER): Payer: Self-pay

## 2013-12-07 DIAGNOSIS — Z1231 Encounter for screening mammogram for malignant neoplasm of breast: Secondary | ICD-10-CM

## 2014-02-04 ENCOUNTER — Other Ambulatory Visit: Payer: Self-pay | Admitting: Internal Medicine

## 2014-04-04 ENCOUNTER — Other Ambulatory Visit: Payer: BLUE CROSS/BLUE SHIELD

## 2014-04-04 DIAGNOSIS — B20 Human immunodeficiency virus [HIV] disease: Secondary | ICD-10-CM

## 2014-04-04 LAB — CBC WITH DIFFERENTIAL/PLATELET
BASOS PCT: 1 % (ref 0–1)
Basophils Absolute: 0.1 10*3/uL (ref 0.0–0.1)
EOS PCT: 2 % (ref 0–5)
Eosinophils Absolute: 0.1 10*3/uL (ref 0.0–0.7)
HEMATOCRIT: 40.5 % (ref 36.0–46.0)
Hemoglobin: 13.9 g/dL (ref 12.0–15.0)
Lymphocytes Relative: 39 % (ref 12–46)
Lymphs Abs: 2.3 10*3/uL (ref 0.7–4.0)
MCH: 32.8 pg (ref 26.0–34.0)
MCHC: 34.3 g/dL (ref 30.0–36.0)
MCV: 95.5 fL (ref 78.0–100.0)
MONO ABS: 0.5 10*3/uL (ref 0.1–1.0)
MONOS PCT: 8 % (ref 3–12)
MPV: 9.6 fL (ref 8.6–12.4)
NEUTROS ABS: 2.9 10*3/uL (ref 1.7–7.7)
NEUTROS PCT: 50 % (ref 43–77)
PLATELETS: 331 10*3/uL (ref 150–400)
RBC: 4.24 MIL/uL (ref 3.87–5.11)
RDW: 12.9 % (ref 11.5–15.5)
WBC: 5.8 10*3/uL (ref 4.0–10.5)

## 2014-04-04 LAB — COMPLETE METABOLIC PANEL WITH GFR
ALT: 15 U/L (ref 0–35)
AST: 22 U/L (ref 0–37)
Albumin: 4.7 g/dL (ref 3.5–5.2)
Alkaline Phosphatase: 82 U/L (ref 39–117)
BILIRUBIN TOTAL: 0.5 mg/dL (ref 0.2–1.2)
BUN: 15 mg/dL (ref 6–23)
CHLORIDE: 101 meq/L (ref 96–112)
CO2: 29 mEq/L (ref 19–32)
Calcium: 9.7 mg/dL (ref 8.4–10.5)
Creat: 1.17 mg/dL — ABNORMAL HIGH (ref 0.50–1.10)
GFR, Est African American: 58 mL/min — ABNORMAL LOW
GFR, Est Non African American: 50 mL/min — ABNORMAL LOW
GLUCOSE: 75 mg/dL (ref 70–99)
Potassium: 4 mEq/L (ref 3.5–5.3)
Sodium: 139 mEq/L (ref 135–145)
Total Protein: 7.2 g/dL (ref 6.0–8.3)

## 2014-04-05 LAB — HIV-1 RNA QUANT-NO REFLEX-BLD: HIV-1 RNA Quant, Log: <1.3 {Log} (ref <1.30)

## 2014-04-05 LAB — T-HELPER CELL (CD4) - (RCID CLINIC ONLY)
CD4 % Helper T Cell: 37 % (ref 33–55)
CD4 T CELL ABS: 830 /uL (ref 400–2700)

## 2014-04-18 ENCOUNTER — Encounter: Payer: Self-pay | Admitting: Internal Medicine

## 2014-04-18 ENCOUNTER — Ambulatory Visit (INDEPENDENT_AMBULATORY_CARE_PROVIDER_SITE_OTHER): Payer: BLUE CROSS/BLUE SHIELD | Admitting: Internal Medicine

## 2014-04-18 VITALS — BP 145/78 | HR 82 | Temp 98.2°F | Wt 146.0 lb

## 2014-04-18 DIAGNOSIS — B20 Human immunodeficiency virus [HIV] disease: Secondary | ICD-10-CM

## 2014-04-18 DIAGNOSIS — J320 Chronic maxillary sinusitis: Secondary | ICD-10-CM | POA: Diagnosis not present

## 2014-04-18 MED ORDER — ELVITEG-COBIC-EMTRICIT-TENOFAF 150-150-200-10 MG PO TABS: 1.0000 | ORAL_TABLET | Freq: Every day | ORAL | Status: DC

## 2014-04-18 MED ORDER — BECLOMETHASONE DIPROP MONOHYD 42 MCG/SPRAY NA SUSP: 2.0000 | Freq: Two times a day (BID) | NASAL | Status: DC

## 2014-04-18 NOTE — Progress Notes (Signed)
Patient ID: [name], female   DOB: 1950/12/16, 64 y.o.   MRN: [id number]       Patient ID: [name], female   DOB: Jun 06, 1950, 64 y.o.   MRN: [id number]  HPI 64yo F with HIV disease, well controlled, CD 4 count of 830/VL<20, but also HLD, HTN. She is taking fish oil and diet modification for HLD. Takes 2 agents for her bp mgmt. She started to have sinus issues and was started on fluticasone. She takes naproxyn daily for arthritis  Outpatient Encounter Prescriptions as of 04/18/2014  Medication Sig  . amLODipine (NORVASC) 5 MG tablet Take 5 mg by mouth daily.  . benazepril (LOTENSIN) 40 MG tablet Take 40 mg by mouth daily.    . cyclobenzaprine (FLEXERIL) 10 MG tablet Take 1 tablet (10 mg total) by mouth at bedtime.  . gabapentin (NEURONTIN) 100 MG capsule Take 1 capsule (100 mg total) by mouth 4 (four) times daily.  Marland Kitchen glucosamine-chondroitin 500-400 MG tablet Take 1 tablet by mouth 3 (three) times daily.  . naproxen sodium (ANAPROX) 220 MG tablet Take 220 mg by mouth 2 (two) times daily.   . Omega-3 Fatty Acids (FISH OIL) 1200 MG CAPS Take 1,200 mg by mouth 2 (two) times daily.    . STRIBILD 150-150-200-300 MG TABS tablet TAKE 1 TABLET DAILY WITH BREAKFAST     Patient Active Problem List   Diagnosis Date Noted  . Unspecified hereditary and idiopathic peripheral neuropathy 07/16/2012  . Thoracic or lumbosacral neuritis or radiculitis, unspecified 07/16/2012  . Cervicalgia 07/16/2012  . URI 12/02/2006  . HIV DISEASE 11/08/2005  . HYPERLIPIDEMIA 11/08/2005  . HYPERTENSION 11/08/2005  . BRONCHITIS 11/08/2005  . CROHN'S DISEASE 11/08/2005  . SYMPTOM, PAINFUL RESPIRATION 11/08/2005     Health Maintenance Due  Topic Date Due  . COLONOSCOPY  11/20/2001  . PAP SMEAR  12/17/2010  . ZOSTAVAX  11/21/2011     Review of Systems Review of Systems  Constitutional: Negative for fever, chills, diaphoresis, activity change, appetite change, fatigue and unexpected weight  change.  HENT: Negative for congestion, sore throat, rhinorrhea, sneezing, trouble swallowing and sinus pressure.  Eyes: Negative for photophobia and visual disturbance.  Respiratory: Negative for cough, chest tightness, shortness of breath, wheezing and stridor.  Cardiovascular: Negative for chest pain, palpitations and leg swelling.  Gastrointestinal: Negative for nausea, vomiting, abdominal pain, diarrhea, constipation, blood in stool, abdominal distention and anal bleeding.  Genitourinary: Negative for dysuria, hematuria, flank pain and difficulty urinating.  Musculoskeletal: Negative for myalgias, back pain, joint swelling, arthralgias and gait problem.  Skin: Negative for color change, pallor, rash and wound.  Neurological: Negative for dizziness, tremors, weakness and light-headedness.  Hematological: Negative for adenopathy. Does not bruise/bleed easily.  Psychiatric/Behavioral: Negative for behavioral problems, confusion, sleep disturbance, dysphoric mood, decreased concentration and agitation.    Physical Exam   BP 145/78 mmHg  Pulse 82  Temp(Src) 98.2 F (36.8 C) (Oral)  Wt 146 lb (66.225 kg) Physical Exam  Constitutional:  oriented to person, place, and time. appears well-developed and well-nourished. No distress.  HENT:  Mouth/Throat: Oropharynx is clear and moist. No oropharyngeal exudate.  Cardiovascular: Normal rate, regular rhythm and normal heart sounds. Exam reveals no gallop and no friction rub.  No murmur heard.  Pulmonary/Chest: Effort normal and breath sounds normal. No respiratory distress.  has no wheezes.  Abdominal: Soft. Bowel sounds are normal.  exhibits no distension. There is no tenderness.  Lymphadenopathy: no cervical adenopathy.  Neurological: alert and oriented  to person, place, and time.  Skin: Skin is warm and dry. No rash noted. No erythema.  Psychiatric: a normal mood and affect. behavior is normal.   Lab Results  Component Value Date    CD4TCELL 37 04/04/2014   Lab Results  Component Value Date   CD4TABS 830 04/04/2014   CD4TABS 810 10/05/2013   CD4TABS 940 06/29/2013   Lab Results  Component Value Date   HIV1RNAQUANT <20 04/04/2014   Lab Results  Component Value Date   HEPBSAB No 03/24/2006   No results found for: RPR  CBC Lab Results  Component Value Date   WBC 5.8 04/04/2014   RBC 4.24 04/04/2014   HGB 13.9 04/04/2014   HCT 40.5 04/04/2014   PLT 331 04/04/2014   MCV 95.5 04/04/2014   MCH 32.8 04/04/2014   MCHC 34.3 04/04/2014   RDW 12.9 04/04/2014   LYMPHSABS 2.3 04/04/2014   MONOABS 0.5 04/04/2014   EOSABS 0.1 04/04/2014   BASOSABS 0.1 04/04/2014   BMET Lab Results  Component Value Date   NA 139 04/04/2014   K 4.0 04/04/2014   CL 101 04/04/2014   CO2 29 04/04/2014   GLUCOSE 75 04/04/2014   BUN 15 04/04/2014   CREATININE 1.17* 04/04/2014   CALCIUM 9.7 04/04/2014   GFRNONAA 50* 04/04/2014   GFRAA 58* 04/04/2014     Assessment and Plan  hiv disease = will change to genvoya to minimize TDF effects on kidneys and bone health  sinusitis = will change her beconase. Ask her to stop fluticasone  htn = pretty good control, continue on current regimen  hld = will check lipids at next visit  rtc in 6 months

## 2014-04-20 ENCOUNTER — Other Ambulatory Visit: Payer: Self-pay | Admitting: *Deleted

## 2014-04-20 ENCOUNTER — Telehealth: Payer: Self-pay | Admitting: *Deleted

## 2014-04-20 DIAGNOSIS — B20 Human immunodeficiency virus [HIV] disease: Secondary | ICD-10-CM

## 2014-04-20 DIAGNOSIS — J320 Chronic maxillary sinusitis: Secondary | ICD-10-CM

## 2014-04-20 MED ORDER — ELVITEG-COBIC-EMTRICIT-TENOFAF 150-150-200-10 MG PO TABS: 1.0000 | ORAL_TABLET | Freq: Every day | ORAL | Status: DC

## 2014-04-20 MED ORDER — FLUNISOLIDE 25 MCG/ACT (0.025%) NA SOLN: 2.0000 | Freq: Two times a day (BID) | NASAL | Status: DC

## 2014-04-20 MED ORDER — BECLOMETHASONE DIPROP MONOHYD 42 MCG/SPRAY NA SUSP: 2.0000 | Freq: Two times a day (BID) | NASAL | Status: DC

## 2014-04-20 NOTE — Telephone Encounter (Signed)
Requesting 90-day supply of rxes to [facility]

## 2014-08-12 ENCOUNTER — Encounter: Payer: Self-pay | Admitting: Nurse Practitioner

## 2014-09-16 ENCOUNTER — Ambulatory Visit: Payer: BC Managed Care – PPO | Admitting: Nurse Practitioner

## 2014-09-27 ENCOUNTER — Ambulatory Visit (INDEPENDENT_AMBULATORY_CARE_PROVIDER_SITE_OTHER): Payer: BLUE CROSS/BLUE SHIELD | Admitting: Nurse Practitioner

## 2014-09-27 ENCOUNTER — Encounter: Payer: Self-pay | Admitting: Nurse Practitioner

## 2014-09-27 VITALS — BP 121/76 | HR 81 | Ht 63.0 in | Wt 145.4 lb

## 2014-09-27 DIAGNOSIS — M5416 Radiculopathy, lumbar region: Secondary | ICD-10-CM | POA: Diagnosis not present

## 2014-09-27 DIAGNOSIS — G609 Hereditary and idiopathic neuropathy, unspecified: Secondary | ICD-10-CM | POA: Diagnosis not present

## 2014-09-27 DIAGNOSIS — M542 Cervicalgia: Secondary | ICD-10-CM | POA: Diagnosis not present

## 2014-09-27 MED ORDER — CYCLOBENZAPRINE HCL 10 MG PO TABS: 10.0000 mg | ORAL_TABLET | Freq: Every day | ORAL | Status: DC

## 2014-09-27 MED ORDER — GABAPENTIN 100 MG PO CAPS: 100.0000 mg | ORAL_CAPSULE | Freq: Two times a day (BID) | ORAL | Status: DC

## 2014-09-27 NOTE — Progress Notes (Signed)
[facility]  PATIENT: [name] DOB: [birth year]   REASON FOR VISIT: Follow-up for headaches, neck pain, peripheral neuropathy HISTORY FROM: Patient    HISTORY OF PRESENT ILLNESS:[name], 64 year old female returns for followup. She was last seen in this office 09/14/2013.Headaches are well controlled on gabapentin and her neck pain is well-controlled on Flexeril at bedtime only. She continues to do neck exercises. She had cervical fusion 1989. She denies any further bouts of vertigo,she also does vestibular exercises. She has no new neurologic complaints. She returns for routine yearly evaluation and to refill her medications  HISTORY:Patient returns for followup after last visit 01/16/2012. She was originally evaluated by Dr. Erling Cruz 03/21/09. She has a history of HIV infection since 1992 contracted from her first husband She's been followed by Dr. Lars Mage, infectious disease specialist in [place] for many years. 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 She has a history of arthritis and underwent carpal tunnel syndrome surgery to both hands by Dr.Nudelman in 2006 and underwent C5-C6 anterior cervical fusion by Dr.Botero in 1989. She's had a history of lower back pain followed by Dr. Durward Fortes with epidural steroid injections. Following one set of epidural steroid injections she developed a low serum sodium of 120 requiring hospitalization.Dr. Erling Cruz saw her 04/15/2007 with proximal upper extremity weakness and lower extremity weakness most likely secondary to myopathy. EMG/NCV was normal and her symptoms improved with time. Blood studies included angiotensin-converting, ANA, CPK, sedimentation rate, RPR, serum protein electrophoresis, hemoglobin A1c, fasting glucose,TSH, and B12 which were normal 04/16/07. Since November 2010 she reports a nagging headache occurring at the base of her skull extending toward her ears bilaterally and also making  her scalp "sore to touch". She was seen by Ear, Nose,and Throat physicians and placed on courses of exercises which improved, but did not relieve her symptoms. For her headaches and neck pain.,she's been seen by her family practice doctor, an orthopedist Dr. Durward Fortes, and Dr.Nudelman, a neurosurgeon. She says with her headaches she has difficulty with bright lights and loud noises.She notes dry eyes. She has a history of migraine headaches in her 33s. She underwent an MRI study of the cervical spine without contrast which revealed (2010) increased spondylosis at C3-4  and C6 -7 and mild cord deformities at both levels, greater at C 6-7. At C4-5 she has spondylosis with central cord indented by disc. She underwent MRI Brain study 02/18/2009 with and without contrast showing bilateral white matter hyperintense lesions. These were nonspecific. Has been seen by Dr. Katy Fitch and placed on eyedrops.She was started on Flexeril and Gabapentin by Dr. Erling Cruz and her symptoms have greatly improved, since April 2011.  She returns today for followup and her headaches are in control with gabapentin and Flexeril. She has not had further bouts of vertigo. She is doing her vestibular exercises. She has no new neurologic complaints     REVIEW OF SYSTEMS: Full 14 system review of systems performed and notable only for those listed, all others are neg:  Constitutional: neg  Cardiovascular: neg Ear/Nose/Throat: neg  Skin: neg Eyes: neg Respiratory: neg Gastroitestinal: neg  Hematology/Lymphatic: neg  Endocrine: neg Musculoskeletal: Neck pain Allergy/Immunology: neg Neurological: Headaches  Psychiatric: neg Sleep : neg   ALLERGIES: No Known Allergies  HOME MEDICATIONS: Outpatient Prescriptions Prior to Visit  Medication Sig Dispense Refill  . amLODipine (NORVASC) 5 MG tablet Take 5 mg by mouth daily.    . benazepril (LOTENSIN) 40 MG tablet Take 40 mg by mouth  daily.      . cyclobenzaprine (FLEXERIL) 10 MG  tablet Take 1 tablet (10 mg total) by mouth at bedtime. 90 tablet 3  . elvitegravir-cobicistat-emtricitabine-tenofovir (GENVOYA) 150-150-200-10 MG TABS tablet Take 1 tablet by mouth daily with breakfast. 90 tablet 3  . gabapentin (NEURONTIN) 100 MG capsule Take 1 capsule (100 mg total) by mouth 4 (four) times daily. 360 capsule 3  . naproxen sodium (ANAPROX) 220 MG tablet Take 220 mg by mouth daily.     . Omega-3 Fatty Acids (FISH OIL) 1200 MG CAPS Take 1,200 mg by mouth 2 (two) times daily.      . flunisolide (NASALIDE) 25 MCG/ACT (0.025%) SOLN Place 2 sprays into the nose 2 (two) times daily. 3 Bottle 3  . glucosamine-chondroitin 500-400 MG tablet Take 1 tablet by mouth 3 (three) times daily.     No facility-administered medications prior to visit.    PAST MEDICAL HISTORY: Past Medical History  Diagnosis Date  . HIV (human immunodeficiency virus infection)   . Hypertension   . Migraine   . Spinal stenosis   . PONV (postoperative nausea and vomiting)     said usually has to have scop patch  . Neuropathy     PAST SURGICAL HISTORY: Past Surgical History  Procedure Laterality Date  . Cervical fusion  1989  . Abdominal hysterectomy  1990  . Tonsillectomy    . Carpal tunnel release      both hands    FAMILY HISTORY: Family History  Problem Relation Age of Onset  . Cancer Father   . High blood pressure Mother   . High blood pressure Father   . High Cholesterol Mother   . High Cholesterol Father     SOCIAL HISTORY: Social History   Social History  . Marital Status: Married    Spouse Name: [name]  . Number of Children: 1  . Years of Education: 12   Occupational History  . Retired    Social History Main Topics  . Smoking status: Never Smoker   . Smokeless tobacco: Never Used  . Alcohol Use: 0.6 oz/week    1 Glasses of wine per week     Comment: on weekends  . Drug Use: No  . Sexual Activity: Yes     Comment: declined condoms   Other Topics Concern  . Not on  file   Social History Narrative   Patient lives at home with her husband [name]). 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   Caffeine - None    Right handed.       PHYSICAL EXAM  Filed Vitals:   09/27/14 1129  BP: 121/76  Pulse: 81  Height: 5\' 3"  (1.6 m)  Weight: 145 lb 6.4 oz (65.953 kg)   Body mass index is 25.76 kg/(m^2). General: well developed, well nourished, seated, in no evident distress  Head: head normocephalic and atraumatic. Oropharynx benign  Neck: Neck flexion and extension maneuvers with mild restriction , no carotid bruits  Neurologic Exam  Mental Status: Awake and fully alert. Oriented to place and time. Follows all commands. Speech and language normal.  Cranial Nerves: Pupils equal, briskly reactive to light. Extraocular movements full without nystagmus. Visual fields full to confrontation. Hearing intact and symmetric to finger snap. Facial sensation intact. Face, tongue, palate move normally and symmetrically. Neck flexion and extension normal.  Motor: Normal bulk and tone. Normal strength in all tested extremity muscles.No focal weakness  Sensory.: intact to touch and pinprick and  vibratory in the upper and lower extremities.  Coordination: Rapid alternating movements normal in all extremities. Finger-to-nose and heel-to-shin performed accurately bilaterally.  Gait and Station: Arises from chair without difficulty. Stance is normal. Gait demonstrates normal stride length and balance . Able to heel, toe and tandem walk without difficulty. Romberg negative  Reflexes: 2+ and symmetric. Toes downgoing.   DIAGNOSTIC DATA (LABS, IMAGING, TESTING) - I reviewed patient records, labs, notes, testing and imaging myself where available.  Lab Results  Component Value Date   WBC 5.8 04/04/2014   HGB 13.9 04/04/2014   HCT 40.5 04/04/2014   MCV 95.5 04/04/2014   PLT 331 04/04/2014      Component Value Date/Time   NA 139 04/04/2014 0925   K 4.0  04/04/2014 0925   CL 101 04/04/2014 0925   CO2 29 04/04/2014 0925   GLUCOSE 75 04/04/2014 0925   BUN 15 04/04/2014 0925   CREATININE 1.17* 04/04/2014 0925   CREATININE 0.83 11/06/2011 1100   CALCIUM 9.7 04/04/2014 0925   PROT 7.2 04/04/2014 0925   ALBUMIN 4.7 04/04/2014 0925   AST 22 04/04/2014 0925   ALT 15 04/04/2014 0925   ALKPHOS 82 04/04/2014 0925   BILITOT 0.5 04/04/2014 0925   GFRNONAA 50* 04/04/2014 0925   GFRNONAA 76* 11/06/2011 1100   GFRAA 58* 04/04/2014 0925   GFRAA 40* 11/06/2011 1100     ASSESSMENT AND PLAN 64 y.o. year old female has a past medical history of HIV (human immunodeficiency virus infection); Hypertension; Migraine; Spinal stenosis; and Neuropathy. here to followup. Her symptoms are currently well controlled on gabapentin and Flexeril.The patient is a current patient of Dr. Krista Blue  who is out of the office today . This note is sent to the work in doctor.     Continue gabapentin 2 caps daily will refill Continue Flexeril at night, will refill Continue neck exercises and exercises for vertigo Followup yearly and when necessary Dennie Bible, [facility], [facility], APRN  [facility] [address] [place], [place] [address] [phone]

## 2014-09-27 NOTE — Patient Instructions (Signed)
Continue gabapentin 2 caps daily will refill Continue Flexeril at night, will refill Followup yearly and when necessary

## 2014-09-27 NOTE — Progress Notes (Signed)
I reviewed note and agree with plan.   Penni Bombard, MD 7/68/0881, 1:03 PM Certified in Neurology, Neurophysiology and Neuroimaging  [facility] [address], [place], [place] [address] [phone]

## 2014-10-05 ENCOUNTER — Other Ambulatory Visit: Payer: BLUE CROSS/BLUE SHIELD

## 2014-10-20 ENCOUNTER — Ambulatory Visit: Payer: BLUE CROSS/BLUE SHIELD | Admitting: Internal Medicine

## 2014-10-25 ENCOUNTER — Other Ambulatory Visit: Payer: BLUE CROSS/BLUE SHIELD

## 2014-10-25 DIAGNOSIS — Z79899 Other long term (current) drug therapy: Secondary | ICD-10-CM

## 2014-10-25 DIAGNOSIS — Z113 Encounter for screening for infections with a predominantly sexual mode of transmission: Secondary | ICD-10-CM

## 2014-10-25 DIAGNOSIS — B20 Human immunodeficiency virus [HIV] disease: Secondary | ICD-10-CM

## 2014-10-25 LAB — CBC WITH DIFFERENTIAL/PLATELET
Basophils Absolute: 0.1 10*3/uL (ref 0.0–0.1)
Basophils Relative: 1 % (ref 0–1)
EOS PCT: 2 % (ref 0–5)
Eosinophils Absolute: 0.1 10*3/uL (ref 0.0–0.7)
HEMATOCRIT: 39.7 % (ref 36.0–46.0)
Hemoglobin: 13.9 g/dL (ref 12.0–15.0)
LYMPHS PCT: 44 % (ref 12–46)
Lymphs Abs: 2.3 10*3/uL (ref 0.7–4.0)
MCH: 32.9 pg (ref 26.0–34.0)
MCHC: 35 g/dL (ref 30.0–36.0)
MCV: 93.9 fL (ref 78.0–100.0)
MONO ABS: 0.5 10*3/uL (ref 0.1–1.0)
MONOS PCT: 10 % (ref 3–12)
MPV: 9.5 fL (ref 8.6–12.4)
Neutro Abs: 2.2 10*3/uL (ref 1.7–7.7)
Neutrophils Relative %: 43 % (ref 43–77)
Platelets: 296 10*3/uL (ref 150–400)
RBC: 4.23 MIL/uL (ref 3.87–5.11)
RDW: 13.2 % (ref 11.5–15.5)
WBC: 5.2 10*3/uL (ref 4.0–10.5)

## 2014-10-25 LAB — LIPID PANEL
CHOL/HDL RATIO: 5.7 ratio — AB (ref <=5.0)
CHOLESTEROL: 360 mg/dL — AB (ref 125–200)
HDL: 63 mg/dL (ref >=46)
LDL Cholesterol: 266 mg/dL — ABNORMAL HIGH (ref <130)
Triglycerides: 153 mg/dL — ABNORMAL HIGH (ref <150)
VLDL: 31 mg/dL — ABNORMAL HIGH (ref <30)

## 2014-10-25 LAB — COMPLETE METABOLIC PANEL WITH GFR
ALK PHOS: 66 U/L (ref 33–130)
ALT: 19 U/L (ref 6–29)
AST: 26 U/L (ref 10–35)
Albumin: 4.6 g/dL (ref 3.6–5.1)
BUN: 14 mg/dL (ref 7–25)
CALCIUM: 10.2 mg/dL (ref 8.6–10.4)
CHLORIDE: 102 mmol/L (ref 98–110)
CO2: 27 mmol/L (ref 20–31)
Creat: 1.05 mg/dL — ABNORMAL HIGH (ref 0.50–0.99)
GFR, EST AFRICAN AMERICAN: 66 mL/min (ref >=60)
GFR, EST NON AFRICAN AMERICAN: 57 mL/min — AB (ref >=60)
Glucose, Bld: 78 mg/dL (ref 65–99)
POTASSIUM: 5.3 mmol/L (ref 3.5–5.3)
Sodium: 138 mmol/L (ref 135–146)
Total Bilirubin: 0.5 mg/dL (ref 0.2–1.2)
Total Protein: 7.3 g/dL (ref 6.1–8.1)

## 2014-10-25 LAB — RPR

## 2014-10-26 LAB — T-HELPER CELL (CD4) - (RCID CLINIC ONLY)
CD4 T CELL HELPER: 39 % (ref 33–55)
CD4 T Cell Abs: 980 /uL (ref 400–2700)

## 2014-10-26 LAB — HIV-1 RNA QUANT-NO REFLEX-BLD

## 2014-11-01 ENCOUNTER — Other Ambulatory Visit: Payer: Self-pay

## 2014-11-01 DIAGNOSIS — Z1231 Encounter for screening mammogram for malignant neoplasm of breast: Secondary | ICD-10-CM

## 2014-11-10 ENCOUNTER — Ambulatory Visit (INDEPENDENT_AMBULATORY_CARE_PROVIDER_SITE_OTHER): Payer: BLUE CROSS/BLUE SHIELD | Admitting: Internal Medicine

## 2014-11-10 ENCOUNTER — Encounter: Payer: Self-pay | Admitting: Internal Medicine

## 2014-11-10 VITALS — BP 135/80 | HR 86 | Temp 97.5°F | Wt 144.0 lb

## 2014-11-10 DIAGNOSIS — B20 Human immunodeficiency virus [HIV] disease: Secondary | ICD-10-CM

## 2014-11-10 DIAGNOSIS — Z23 Encounter for immunization: Secondary | ICD-10-CM | POA: Diagnosis not present

## 2014-11-10 DIAGNOSIS — L989 Disorder of the skin and subcutaneous tissue, unspecified: Secondary | ICD-10-CM | POA: Diagnosis not present

## 2014-11-10 DIAGNOSIS — D239 Other benign neoplasm of skin, unspecified: Secondary | ICD-10-CM | POA: Diagnosis not present

## 2014-11-10 DIAGNOSIS — E781 Pure hyperglyceridemia: Secondary | ICD-10-CM

## 2014-11-14 NOTE — Progress Notes (Signed)
Patient ID: [name], female   DOB: 11-27-1950, 64 y.o.   MRN: [id number]       Patient ID: [name], female   DOB: 1950/09/30, 64 y.o.   MRN: [id number]  HPI 64yo F with HIV disease, Cd  4 count 980/VL<20 on genvoya. She has been doing well since changing off since [place]. She continues to take fish oil 2 capsules per day for her HLD/elevated TG. She states that she does not tolerate statins due to myositis and possible memory loss. Overall, continues to do diet modification  She has been in good health except she notices a skin lesion on her forehead slightly increased in size, and briefly bleeding.  Outpatient Encounter Prescriptions as of 11/10/2014  Medication Sig  . amLODipine (NORVASC) 5 MG tablet Take 5 mg by mouth daily.  . benazepril (LOTENSIN) 40 MG tablet Take 40 mg by mouth daily.    . cyclobenzaprine (FLEXERIL) 10 MG tablet Take 1 tablet (10 mg total) by mouth at bedtime.  . elvitegravir-cobicistat-emtricitabine-tenofovir (GENVOYA) 150-150-200-10 MG TABS tablet Take 1 tablet by mouth daily with breakfast.  . gabapentin (NEURONTIN) 100 MG capsule Take 1 capsule (100 mg total) by mouth 2 (two) times daily.  . Glucosamine Sulfate 375 MG TABS Take 1 capsule by mouth 3 (three) times daily.  . naproxen sodium (ANAPROX) 220 MG tablet Take 220 mg by mouth daily.   . Omega-3 Fatty Acids (FISH OIL) 1200 MG CAPS Take 1,200 mg by mouth 2 (two) times daily.     No facility-administered encounter medications on file as of 11/10/2014.     Patient Active Problem List   Diagnosis Date Noted  . Hereditary and idiopathic peripheral neuropathy 07/16/2012  . Lumbar neuritis 07/16/2012  . Cervicalgia 07/16/2012  . URI 12/02/2006  . HIV DISEASE 11/08/2005  . HYPERLIPIDEMIA 11/08/2005  . HYPERTENSION 11/08/2005  . BRONCHITIS 11/08/2005  . CROHN'S DISEASE 11/08/2005  . SYMPTOM, PAINFUL RESPIRATION 11/08/2005     Health Maintenance Due  Topic Date Due  . COLONOSCOPY   11/20/2001  . PAP SMEAR  12/17/2010  . ZOSTAVAX  11/21/2011     Review of Systems A comprehensive review conducted except for skin lesion  Physical Exam   BP 135/80 mmHg  Pulse 86  Temp(Src) 97.5 F (36.4 C) (Oral)  Wt 144 lb (65.318 kg) Physical Exam  Constitutional:  oriented to person, place, and time. appears well-developed and well-nourished. No distress.  HENT: /AT, PERRLA, no scleral icterus Mouth/Throat: Oropharynx is clear and moist. No oropharyngeal exudate.  Cardiovascular: Normal rate, regular rhythm and normal heart sounds. Exam reveals no gallop and no friction rub.  No murmur heard.  Pulmonary/Chest: Effort normal and breath sounds normal. No respiratory distress.  has no wheezes.  Neck = supple, no nuchal rigidity Lymphadenopathy: no cervical adenopathy. No axillary adenopathy Neurological: alert and oriented to person, place, and time.  Skin: Skin is warm and dry. No rash noted. No erythema. 1 cm raised papule c/w dermato-fibroma that is slightly scabbed. Psychiatric: a normal mood and affect.  behavior is normal.   Lab Results  Component Value Date   CD4TCELL 39 10/25/2014   Lab Results  Component Value Date   CD4TABS 980 10/25/2014   CD4TABS 830 04/04/2014   CD4TABS 810 10/05/2013   Lab Results  Component Value Date   HIV1RNAQUANT <20 10/25/2014   Lab Results  Component Value Date   HEPBSAB No 03/24/2006   No results found for: RPR  CBC Lab Results  Component Value Date   WBC 5.2 10/25/2014   RBC 4.23 10/25/2014   HGB 13.9 10/25/2014   HCT 39.7 10/25/2014   PLT 296 10/25/2014   MCV 93.9 10/25/2014   MCH 32.9 10/25/2014   MCHC 35.0 10/25/2014   RDW 13.2 10/25/2014   LYMPHSABS 2.3 10/25/2014   MONOABS 0.5 10/25/2014   EOSABS 0.1 10/25/2014   BASOSABS 0.1 10/25/2014   BMET Lab Results  Component Value Date   NA 138 10/25/2014   K 5.3 10/25/2014   CL 102 10/25/2014   CO2 27 10/25/2014   GLUCOSE 78 10/25/2014   BUN 14 10/25/2014    CREATININE 1.05* 10/25/2014   CALCIUM 10.2 10/25/2014   GFRNONAA 57* 10/25/2014   GFRAA 66 10/25/2014     Assessment and Plan  hiv disease = well controlled  HLD/hyper TG = will increase fish oil caps to 4 caps per day. Also discussed that this will not be sufficient. Introduced idea of taking zetia.  Dermatofibroma = will refer to dermatology to see if needs to be excised  HTN = well controlled

## 2014-11-15 ENCOUNTER — Telehealth: Payer: Self-pay | Admitting: *Deleted

## 2014-11-15 NOTE — Telephone Encounter (Signed)
Called patient and advised her of appt for Dermatology set for her at [facility], [address], [phone]. The appt is 11/17/14 at 220 pm with Dr Renard Hamper. The patient took down all the information and was advised if she can not make it ot call the office and rescheduled.

## 2014-12-09 ENCOUNTER — Ambulatory Visit
Admission: RE | Admit: 2014-12-09 | Discharge: 2014-12-09 | Disposition: A | Discharge status: Home / Self Care | Payer: BLUE CROSS/BLUE SHIELD | Source: Ambulatory Visit

## 2014-12-09 DIAGNOSIS — Z1231 Encounter for screening mammogram for malignant neoplasm of breast: Secondary | ICD-10-CM

## 2015-04-11 ENCOUNTER — Other Ambulatory Visit: Payer: Self-pay | Admitting: Internal Medicine

## 2015-04-11 DIAGNOSIS — B20 Human immunodeficiency virus [HIV] disease: Secondary | ICD-10-CM

## 2015-05-01 ENCOUNTER — Other Ambulatory Visit: Payer: BLUE CROSS/BLUE SHIELD

## 2015-05-01 DIAGNOSIS — Z79899 Other long term (current) drug therapy: Secondary | ICD-10-CM

## 2015-05-01 DIAGNOSIS — Z113 Encounter for screening for infections with a predominantly sexual mode of transmission: Secondary | ICD-10-CM

## 2015-05-01 DIAGNOSIS — B20 Human immunodeficiency virus [HIV] disease: Secondary | ICD-10-CM

## 2015-05-01 LAB — COMPREHENSIVE METABOLIC PANEL WITH GFR
ALT: 23 U/L (ref 6–29)
AST: 28 U/L (ref 10–35)
Albumin: 4.7 g/dL (ref 3.6–5.1)
Alkaline Phosphatase: 65 U/L (ref 33–130)
BUN: 13 mg/dL (ref 7–25)
CO2: 27 mmol/L (ref 20–31)
Calcium: 9.6 mg/dL (ref 8.6–10.4)
Chloride: 103 mmol/L (ref 98–110)
Creat: 0.87 mg/dL (ref 0.50–0.99)
Glucose, Bld: 83 mg/dL (ref 65–99)
Potassium: 4 mmol/L (ref 3.5–5.3)
Sodium: 141 mmol/L (ref 135–146)
Total Bilirubin: 0.4 mg/dL (ref 0.2–1.2)
Total Protein: 7.1 g/dL (ref 6.1–8.1)

## 2015-05-01 LAB — CBC WITH DIFFERENTIAL/PLATELET
Basophils Absolute: 53 {cells}/uL (ref 0–200)
Basophils Relative: 1 %
Eosinophils Absolute: 212 {cells}/uL (ref 15–500)
Eosinophils Relative: 4 %
HCT: 40.4 % (ref 35.0–45.0)
Hemoglobin: 14.3 g/dL (ref 11.7–15.5)
Lymphocytes Relative: 41 %
Lymphs Abs: 2173 {cells}/uL (ref 850–3900)
MCH: 33.7 pg — ABNORMAL HIGH (ref 27.0–33.0)
MCHC: 35.4 g/dL (ref 32.0–36.0)
MCV: 95.3 fL (ref 80.0–100.0)
MPV: 9.3 fL (ref 7.5–12.5)
Monocytes Absolute: 477 {cells}/uL (ref 200–950)
Monocytes Relative: 9 %
Neutro Abs: 2385 {cells}/uL (ref 1500–7800)
Neutrophils Relative %: 45 %
Platelets: 285 K/uL (ref 140–400)
RBC: 4.24 MIL/uL (ref 3.80–5.10)
RDW: 13 % (ref 11.0–15.0)
WBC: 5.3 K/uL (ref 3.8–10.8)

## 2015-05-01 LAB — LIPID PANEL
Cholesterol: 345 mg/dL — ABNORMAL HIGH (ref 125–200)
HDL: 67 mg/dL
LDL Cholesterol: 238 mg/dL — ABNORMAL HIGH
Total CHOL/HDL Ratio: 5.1 ratio — ABNORMAL HIGH
Triglycerides: 200 mg/dL — ABNORMAL HIGH
VLDL: 40 mg/dL — ABNORMAL HIGH

## 2015-05-02 LAB — HIV-1 RNA QUANT-NO REFLEX-BLD: HIV 1 RNA Quant: <20 copies/mL (ref <20)

## 2015-05-02 LAB — T-HELPER CELL (CD4) - (RCID CLINIC ONLY)
CD4 % Helper T Cell: 38 % (ref 33–55)
CD4 T Cell Abs: 880 /uL (ref 400–2700)

## 2015-05-02 LAB — URINE CYTOLOGY ANCILLARY ONLY
CHLAMYDIA, DNA PROBE: NEGATIVE
NEISSERIA GONORRHEA: NEGATIVE

## 2015-05-16 ENCOUNTER — Ambulatory Visit: Payer: BLUE CROSS/BLUE SHIELD | Admitting: Internal Medicine

## 2015-05-18 ENCOUNTER — Encounter: Payer: Self-pay | Admitting: Internal Medicine

## 2015-05-18 ENCOUNTER — Ambulatory Visit (INDEPENDENT_AMBULATORY_CARE_PROVIDER_SITE_OTHER): Payer: BLUE CROSS/BLUE SHIELD | Admitting: Internal Medicine

## 2015-05-18 VITALS — BP 144/94 | HR 92 | Temp 97.8°F | Wt 154.0 lb

## 2015-05-18 DIAGNOSIS — E7800 Pure hypercholesterolemia, unspecified: Secondary | ICD-10-CM

## 2015-05-18 MED ORDER — ROSUVASTATIN CALCIUM 5 MG PO TABS: 5.0000 mg | ORAL_TABLET | Freq: Every day | ORAL | Status: DC

## 2015-05-18 NOTE — Progress Notes (Signed)
Patient ID: [name], female   DOB: 10-Jan-1951, 64 y.o.   MRN: [id number]       Patient ID: [name], female   DOB: 1951/01/28, 65 y.o.   MRN: [id number]  HPI 65yo F with longstanding hiv disease, well controlled. Cd 4 count of  880/VL<2o on genvoya. tolerating without difficulty. She is concerned for hyperlipidemia/cholesteremia given her recent lab work.   Outpatient Encounter Prescriptions as of 05/18/2015  Medication Sig  . amLODipine (NORVASC) 5 MG tablet Take 5 mg by mouth daily.  . benazepril (LOTENSIN) 40 MG tablet Take 40 mg by mouth daily.    . cyclobenzaprine (FLEXERIL) 10 MG tablet Take 1 tablet (10 mg total) by mouth at bedtime.  . gabapentin (NEURONTIN) 100 MG capsule Take 1 capsule (100 mg total) by mouth 2 (two) times daily.  . GENVOYA 150-150-200-10 MG TABS tablet TAKE 1 TABLET DAILY WITH BREAKFAST  . naproxen sodium (ANAPROX) 220 MG tablet Take 220 mg by mouth daily.   . Omega-3 Fatty Acids (FISH OIL) 1200 MG CAPS Take 1,200 mg by mouth 2 (two) times daily.    . Glucosamine Sulfate 375 MG TABS Take 1 capsule by mouth 3 (three) times daily. Reported on 05/18/2015  . rosuvastatin (CRESTOR) 5 MG tablet Take 1 tablet (5 mg total) by mouth at bedtime.   No facility-administered encounter medications on file as of 05/18/2015.     Patient Active Problem List   Diagnosis Date Noted  . Hereditary and idiopathic peripheral neuropathy 07/16/2012  . Lumbar neuritis 07/16/2012  . Cervicalgia 07/16/2012  . URI 12/02/2006  . HIV DISEASE 11/08/2005  . HYPERLIPIDEMIA 11/08/2005  . HYPERTENSION 11/08/2005  . BRONCHITIS 11/08/2005  . CROHN'S DISEASE 11/08/2005  . SYMPTOM, PAINFUL RESPIRATION 11/08/2005     Health Maintenance Due  Topic Date Due  . COLONOSCOPY  11/20/2001  . PAP SMEAR  12/17/2010  . ZOSTAVAX  11/21/2011     Review of Systems  Constitutional: Negative for fever, chills, diaphoresis, activity change, appetite change, fatigue and unexpected  weight change.  HENT: Negative for congestion, sore throat, rhinorrhea, sneezing, trouble swallowing and sinus pressure.  Eyes: Negative for photophobia and visual disturbance.  Respiratory: Negative for cough, chest tightness, shortness of breath, wheezing and stridor.  Cardiovascular: Negative for chest pain, palpitations and leg swelling.  Gastrointestinal: Negative for nausea, vomiting, abdominal pain, diarrhea, constipation, blood in stool, abdominal distention and anal bleeding.  Genitourinary: Negative for dysuria, hematuria, flank pain and difficulty urinating.  Musculoskeletal: Negative for myalgias, back pain, joint swelling, arthralgias and gait problem.  Skin: Negative for color change, pallor, rash and wound.  Neurological: Negative for dizziness, tremors, weakness and light-headedness.  Hematological: Negative for adenopathy. Does not bruise/bleed easily.  Psychiatric/Behavioral: Negative for behavioral problems, confusion, sleep disturbance, dysphoric mood, decreased concentration and agitation.     Physical Exam   BP 144/94 mmHg  Pulse 92  Temp(Src) 97.8 F (36.6 C) (Oral)  Wt 154 lb (69.854 kg)  SpO2 98% Physical Exam  Constitutional:  oriented to person, place, and time. appears well-developed and well-nourished. No distress.  HENT: [place]/AT, PERRLA, no scleral icterus Mouth/Throat: Oropharynx is clear and moist. No oropharyngeal exudate.  Cardiovascular: Normal rate, regular rhythm and normal heart sounds. Exam reveals no gallop and no friction rub.  No murmur heard.  Pulmonary/Chest: Effort normal and breath sounds normal. No respiratory distress.  has no wheezes.  Neck = supple, no nuchal rigidity Abdominal: Soft. Bowel sounds are normal.  exhibits no distension.  There is no tenderness.  Lymphadenopathy: no cervical adenopathy. No axillary adenopathy Neurological: alert and oriented to person, place, and time.  Skin: Skin is warm and dry. No rash noted. No  erythema.  Psychiatric: a normal mood and affect.  behavior is normal.   Lab Results  Component Value Date   CD4TCELL 38 05/01/2015   Lab Results  Component Value Date   CD4TABS 880 05/01/2015   CD4TABS 980 10/25/2014   CD4TABS 830 04/04/2014   Lab Results  Component Value Date   HIV1RNAQUANT <20 05/01/2015   Lab Results  Component Value Date   HEPBSAB No 03/24/2006   No results found for: RPR  CBC Lab Results  Component Value Date   WBC 5.3 05/01/2015   RBC 4.24 05/01/2015   HGB 14.3 05/01/2015   HCT 40.4 05/01/2015   PLT 285 05/01/2015   MCV 95.3 05/01/2015   MCH 33.7* 05/01/2015   MCHC 35.4 05/01/2015   RDW 13.0 05/01/2015   LYMPHSABS 2173 05/01/2015   MONOABS 477 05/01/2015   EOSABS 212 05/01/2015   BASOSABS 53 05/01/2015   BMET Lab Results  Component Value Date   NA 141 05/01/2015   K 4.0 05/01/2015   CL 103 05/01/2015   CO2 27 05/01/2015   GLUCOSE 83 05/01/2015   BUN 13 05/01/2015   CREATININE 0.87 05/01/2015   CALCIUM 9.6 05/01/2015   GFRNONAA 57* 10/25/2014   GFRAA 66 10/25/2014     Assessment and Plan hiv disease = well controlled on current regimen. To continue with great adherence  Hypercholesteremia = elevated LDL. She has had myositis and other intolerances to lipid lower agents in the past. She is unable to get to goal with fish oil and diet modification. We had long discussion of pros and cons and she is willing to be reintroduced to lipid lower agent. - will start crestor 5mg . At low dose due to hx of mopathy  htn = well controlled, continue on current regimen

## 2015-06-12 ENCOUNTER — Encounter: Payer: Self-pay | Admitting: Internal Medicine

## 2015-06-12 ENCOUNTER — Ambulatory Visit (INDEPENDENT_AMBULATORY_CARE_PROVIDER_SITE_OTHER): Payer: BLUE CROSS/BLUE SHIELD | Admitting: Internal Medicine

## 2015-06-12 VITALS — BP 121/77 | HR 91 | Temp 97.7°F | Ht 65.0 in | Wt 153.0 lb

## 2015-06-12 DIAGNOSIS — E7800 Pure hypercholesterolemia, unspecified: Secondary | ICD-10-CM

## 2015-06-12 DIAGNOSIS — I10 Essential (primary) hypertension: Secondary | ICD-10-CM

## 2015-06-12 DIAGNOSIS — B20 Human immunodeficiency virus [HIV] disease: Secondary | ICD-10-CM

## 2015-06-12 MED ORDER — ELVITEG-COBIC-EMTRICIT-TENOFAF 150-150-200-10 MG PO TABS: 1.0000 | ORAL_TABLET | Freq: Every day | ORAL | Status: DC

## 2015-06-12 NOTE — Progress Notes (Signed)
Patient ID: [name], female   DOB: 01/12/1951, 65 y.o.   MRN: [id number]       Patient ID: [name], female   DOB: 1950/10/13, 65 y.o.   MRN: [id number]  HPI 66yo F with HIV disease, cd 4 count of 880/VL<20 on genvoya. Se was started last month on crestor 5mg  at bedtime to help with her hypercholesteremia which she is tolerating thus far. She denies any myalgias nor arthralgias that she once experienced.  Outpatient Encounter Prescriptions as of 06/12/2015  Medication Sig  . amLODipine (NORVASC) 5 MG tablet Take 5 mg by mouth daily.  . benazepril (LOTENSIN) 40 MG tablet Take 40 mg by mouth daily.    . Coenzyme Q10 300 MG CAPS Take 300 capsules by mouth daily.  . cyclobenzaprine (FLEXERIL) 10 MG tablet Take 1 tablet (10 mg total) by mouth at bedtime.  . gabapentin (NEURONTIN) 100 MG capsule Take 1 capsule (100 mg total) by mouth 2 (two) times daily.  . GENVOYA 150-150-200-10 MG TABS tablet TAKE 1 TABLET DAILY WITH BREAKFAST  . Glucosamine Sulfate 375 MG TABS Take 1 capsule by mouth 3 (three) times daily. Reported on 05/18/2015  . naproxen sodium (ANAPROX) 220 MG tablet Take 220 mg by mouth daily.   . Omega-3 Fatty Acids (FISH OIL) 1200 MG CAPS Take 1,200 mg by mouth 2 (two) times daily.    . rosuvastatin (CRESTOR) 5 MG tablet Take 1 tablet (5 mg total) by mouth at bedtime.   No facility-administered encounter medications on file as of 06/12/2015.     Patient Active Problem List   Diagnosis Date Noted  . Hereditary and idiopathic peripheral neuropathy 07/16/2012  . Lumbar neuritis 07/16/2012  . Cervicalgia 07/16/2012  . URI 12/02/2006  . HIV DISEASE 11/08/2005  . HYPERLIPIDEMIA 11/08/2005  . HYPERTENSION 11/08/2005  . BRONCHITIS 11/08/2005  . CROHN'S DISEASE 11/08/2005  . SYMPTOM, PAINFUL RESPIRATION 11/08/2005     Health Maintenance Due  Topic Date Due  . COLONOSCOPY  11/20/2001  . PAP SMEAR  12/17/2010  . ZOSTAVAX  11/21/2011     Review of Systems Review  of Systems  Constitutional: Negative for fever, chills, diaphoresis, activity change, appetite change, fatigue and unexpected weight change.  HENT: Negative for congestion, sore throat, rhinorrhea, sneezing, trouble swallowing and sinus pressure.  Eyes: Negative for photophobia and visual disturbance.  Respiratory: Negative for cough, chest tightness, shortness of breath, wheezing and stridor.  Cardiovascular: Negative for chest pain, palpitations and leg swelling.  Gastrointestinal: Negative for nausea, vomiting, abdominal pain, diarrhea, constipation, blood in stool, abdominal distention and anal bleeding.  Genitourinary: Negative for dysuria, hematuria, flank pain and difficulty urinating.  Musculoskeletal: Negative for myalgias, back pain, joint swelling, arthralgias and gait problem.  Skin: Negative for color change, pallor, rash and wound.  Neurological: Negative for dizziness, tremors, weakness and light-headedness.  Hematological: Negative for adenopathy. Does not bruise/bleed easily.  Psychiatric/Behavioral: Negative for behavioral problems, confusion, sleep disturbance, dysphoric mood, decreased concentration and agitation.    Physical Exam   BP 121/77 mmHg  Pulse 91  Temp(Src) 97.7 F (36.5 C) (Oral)  Ht 5\' 5"  (1.651 m)  Wt 153 lb (69.4 kg)  BMI 25.46 kg/m2 Physical Exam  Constitutional:  oriented to person, place, and time. appears well-developed and well-nourished. No distress.  HENT: [place]/AT, PERRLA, no scleral icterus Mouth/Throat: Oropharynx is clear and moist. No oropharyngeal exudate.  Cardiovascular: Normal rate, regular rhythm and normal heart sounds. Exam reveals no gallop and no friction rub.  No murmur heard.  Pulmonary/Chest: Effort normal and breath sounds normal. No respiratory distress.  has no wheezes.  Neck = supple, no nuchal rigidity Abdominal: Soft. Bowel sounds are normal.  exhibits no distension. There is no tenderness.  Lymphadenopathy: no cervical  adenopathy. No axillary adenopathy Neurological: alert and oriented to person, place, and time.  Skin: Skin is warm and dry. No rash noted. No erythema.  Psychiatric: a normal mood and affect.  behavior is normal.   Lab Results  Component Value Date   CD4TCELL 38 05/01/2015   Lab Results  Component Value Date   CD4TABS 880 05/01/2015   CD4TABS 980 10/25/2014   CD4TABS 830 04/04/2014   Lab Results  Component Value Date   HIV1RNAQUANT <20 05/01/2015   Lab Results  Component Value Date   HEPBSAB No 03/24/2006   No results found for: RPR  CBC Lab Results  Component Value Date   WBC 5.3 05/01/2015   RBC 4.24 05/01/2015   HGB 14.3 05/01/2015   HCT 40.4 05/01/2015   PLT 285 05/01/2015   MCV 95.3 05/01/2015   MCH 33.7* 05/01/2015   MCHC 35.4 05/01/2015   RDW 13.0 05/01/2015   LYMPHSABS 2173 05/01/2015   MONOABS 477 05/01/2015   EOSABS 212 05/01/2015   BASOSABS 53 05/01/2015   BMET Lab Results  Component Value Date   NA 141 05/01/2015   K 4.0 05/01/2015   CL 103 05/01/2015   CO2 27 05/01/2015   GLUCOSE 83 05/01/2015   BUN 13 05/01/2015   CREATININE 0.87 05/01/2015   CALCIUM 9.6 05/01/2015   GFRNONAA 57* 10/25/2014   GFRAA 66 10/25/2014     Assessment and Plan  Hypercholesteremia = will continue with  crestor 5mg . She is reluctant to increase dose give her past history with side effects with the drug class. Agree to keep taking for the next 5 months, then recheck lipids. In the meantime, continue on fish oil as well  hiv disease = Continue on current hvi regimen, needs refills, will give 90d  Hypertension = well controlled on current regimen. No change in med dosages  Health maintenance = flu vaccination in the Fall

## 2015-06-16 ENCOUNTER — Other Ambulatory Visit: Payer: Self-pay | Admitting: *Deleted

## 2015-06-16 ENCOUNTER — Telehealth: Payer: Self-pay | Admitting: *Deleted

## 2015-06-16 DIAGNOSIS — B20 Human immunodeficiency virus [HIV] disease: Secondary | ICD-10-CM

## 2015-06-16 MED ORDER — ELVITEG-COBIC-EMTRICIT-TENOFAF 150-150-200-10 MG PO TABS: 1.0000 | ORAL_TABLET | Freq: Every day | ORAL | Status: DC

## 2015-06-16 NOTE — Telephone Encounter (Signed)
Patient called stating she never received her HIV meds; as they were sent to the wrong pharmacy; [facility]. I explained that she had four pharmacies listed and I would call it in. I found out that [facility] is only for her maintenance meds and specialty meds need to go to [facility]. Genvoya sent for 1 year supply (90 days) and patient informed and given phone # for [facility].

## 2015-06-19 ENCOUNTER — Encounter: Payer: Self-pay | Admitting: *Deleted

## 2015-06-19 ENCOUNTER — Other Ambulatory Visit: Payer: Self-pay | Admitting: *Deleted

## 2015-06-19 DIAGNOSIS — E7800 Pure hypercholesterolemia, unspecified: Secondary | ICD-10-CM

## 2015-06-19 MED ORDER — ROSUVASTATIN CALCIUM 5 MG PO TABS: 5.0000 mg | ORAL_TABLET | Freq: Every day | ORAL | Status: DC

## 2015-06-19 NOTE — Telephone Encounter (Signed)
error 

## 2015-09-25 ENCOUNTER — Other Ambulatory Visit: Payer: Self-pay | Admitting: Internal Medicine

## 2015-09-26 ENCOUNTER — Encounter: Payer: Self-pay | Admitting: Nurse Practitioner

## 2015-09-26 ENCOUNTER — Ambulatory Visit (INDEPENDENT_AMBULATORY_CARE_PROVIDER_SITE_OTHER): Payer: BLUE CROSS/BLUE SHIELD | Admitting: Nurse Practitioner

## 2015-09-26 VITALS — BP 118/64 | HR 80 | Ht 65.0 in | Wt 149.8 lb

## 2015-09-26 DIAGNOSIS — R519 Headache, unspecified: Secondary | ICD-10-CM

## 2015-09-26 DIAGNOSIS — G609 Hereditary and idiopathic neuropathy, unspecified: Secondary | ICD-10-CM

## 2015-09-26 DIAGNOSIS — M542 Cervicalgia: Secondary | ICD-10-CM

## 2015-09-26 DIAGNOSIS — R51 Headache: Secondary | ICD-10-CM | POA: Diagnosis not present

## 2015-09-26 MED ORDER — CYCLOBENZAPRINE HCL 10 MG PO TABS
10.0000 mg | ORAL_TABLET | Freq: Every day | ORAL | 3 refills | Status: DC
Start: 2015-09-26 — End: 2016-02-27

## 2015-09-26 MED ORDER — GABAPENTIN 100 MG PO CAPS: 100.0000 mg | ORAL_CAPSULE | Freq: Two times a day (BID) | ORAL | 3 refills | Status: DC

## 2015-09-26 NOTE — Patient Instructions (Signed)
Continue gabapentin 2 caps daily will refill Continue Flexeril at night, will refill Continue neck exercises and exercises for vertigo Followup yearly and when necessary

## 2015-09-26 NOTE — Progress Notes (Addendum)
[facility]  PATIENT: [name] Needle DOB: [birth year]   REASON FOR VISIT: Follow-up for headaches, neck pain, peripheral neuropathy HISTORY FROM: Patient    HISTORY OF PRESENT ILLNESS:[name], 65 year old female returns for yearly followup. Headaches are well controlled on gabapentin and her neck pain is well-controlled on Flexeril at bedtime only. She continues to do neck exercises. She had cervical fusion 1989. She denies any further bouts of vertigo,she also does vestibular exercises. She has no new neurologic complaints. She returns for routine yearly evaluation and to refill her medications  HISTORY:Patient returns for followup after last visit 01/16/2012. She was originally evaluated by Dr. Erling Cruz 03/21/09. She has a history of HIV infection since 1992 contracted from her first husband She's been followed by Dr. Lars Mage, infectious disease specialist in [place] for many years. 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 She has a history of arthritis and underwent carpal tunnel syndrome surgery to both hands by Dr.Nudelman in 2006 and underwent C5-C6 anterior cervical fusion by Dr.Botero in 1989. She's had a history of lower back pain followed by Dr. Durward Fortes with epidural steroid injections. Following one set of epidural steroid injections she developed a low serum sodium of 120 requiring hospitalization.Dr. Erling Cruz saw her 04/15/2007 with proximal upper extremity weakness and lower extremity weakness most likely secondary to myopathy. EMG/NCV was normal and her symptoms improved with time. Blood studies included angiotensin-converting, ANA, CPK, sedimentation rate, RPR, serum protein electrophoresis, hemoglobin A1c, fasting glucose,TSH, and B12 which were normal 04/16/07. Since November 2010 she reports a nagging headache occurring at the base of her skull extending toward her ears bilaterally and also making her scalp "sore to touch". She was  seen by Ear, Nose,and Throat physicians and placed on courses of exercises which improved, but did not relieve her symptoms. For her headaches and neck pain.,she's been seen by her family practice doctor, an orthopedist Dr. Durward Fortes, and Dr.Nudelman, a neurosurgeon. She says with her headaches she has difficulty with bright lights and loud noises.She notes dry eyes. She has a history of migraine headaches in her 42s. She underwent an MRI study of the cervical spine without contrast which revealed (2010) increased spondylosis at C3-4  and C6 -7 and mild cord deformities at both levels, greater at C 6-7. At C4-5 she has spondylosis with central cord indented by disc. She underwent MRI Brain study 02/18/2009 with and without contrast showing bilateral white matter hyperintense lesions. These were nonspecific. Has been seen by Dr. Katy Fitch and placed on eyedrops.She was started on Flexeril and Gabapentin by Dr. Erling Cruz and her symptoms have greatly improved, since April 2011.  She returns today for followup and her headaches are in control with gabapentin and Flexeril. She has not had further bouts of vertigo. She is doing her vestibular exercises. She has no new neurologic complaints     REVIEW OF SYSTEMS: Full 14 system review of systems performed and notable only for those listed, all others are neg:  Constitutional: neg  Cardiovascular: neg Ear/Nose/Throat: neg  Skin: neg Eyes: neg Respiratory: neg Gastroitestinal: neg  Hematology/Lymphatic: neg  Endocrine: neg Musculoskeletal: Neck pain Allergy/Immunology: Environmental allergies Neurological: Headaches  Psychiatric: neg Sleep : neg   ALLERGIES: No Known Allergies  HOME MEDICATIONS: Outpatient Medications Prior to Visit  Medication Sig Dispense Refill  . amLODipine (NORVASC) 5 MG tablet Take 5 mg by mouth daily.    . benazepril (LOTENSIN) 40 MG tablet Take 40 mg by mouth daily.      Marland Kitchen  Coenzyme Q10 300 MG CAPS Take 300 capsules by mouth  daily.    . cyclobenzaprine (FLEXERIL) 10 MG tablet Take 1 tablet (10 mg total) by mouth at bedtime. 90 tablet 3  . elvitegravir-cobicistat-emtricitabine-tenofovir (GENVOYA) 150-150-200-10 MG TABS tablet Take 1 tablet by mouth daily with breakfast. 90 tablet 4  . gabapentin (NEURONTIN) 100 MG capsule Take 1 capsule (100 mg total) by mouth 2 (two) times daily. 180 capsule 3  . naproxen sodium (ANAPROX) 220 MG tablet Take 220 mg by mouth daily.     . Omega-3 Fatty Acids (FISH OIL) 1200 MG CAPS Take 1,200 mg by mouth 2 (two) times daily.      . rosuvastatin (CRESTOR) 5 MG tablet Take 1 tablet (5 mg total) by mouth at bedtime. 90 tablet 3  . Glucosamine Sulfate 375 MG TABS Take 1 capsule by mouth 3 (three) times daily. Reported on 05/18/2015     No facility-administered medications prior to visit.     PAST MEDICAL HISTORY: Past Medical History:  Diagnosis Date  . HIV (human immunodeficiency virus infection) ([place])   . Hypertension   . Migraine   . Neuropathy ([place])   . PONV (postoperative nausea and vomiting)    said usually has to have scop patch  . Spinal stenosis     PAST SURGICAL HISTORY: Past Surgical History:  Procedure Laterality Date  . ABDOMINAL HYSTERECTOMY  1990  . CARPAL TUNNEL RELEASE     both hands  . CERVICAL FUSION  1989  . TONSILLECTOMY      FAMILY HISTORY: Family History  Problem Relation Age of Onset  . Cancer Father   . High blood pressure Mother   . High blood pressure Father   . High Cholesterol Mother   . High Cholesterol Father     SOCIAL HISTORY: Social History   Social History  . Marital status: Married    Spouse name: [name]  . Number of children: 1  . Years of education: 5   Occupational History  . Retired    Social History Main Topics  . Smoking status: Never Smoker  . Smokeless tobacco: Never Used  . Alcohol use 0.6 oz/week    1 Glasses of wine per week     Comment: on weekends  . Drug use: No  . Sexual activity: Yes     Comment:  declined condoms   Other Topics Concern  . Not on file   Social History Narrative   Patient lives at home with her husband [name]). 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   Caffeine - None    Right handed.       PHYSICAL EXAM  Vitals:   09/26/15 0920  BP: 118/64  Pulse: 80  Weight: 149 lb 12.8 oz (67.9 kg)  Height: 5\' 5"  (1.651 m)   Body mass index is 24.93 kg/m. General: well developed, well nourished, seated, in no evident distress  Head: head normocephalic and atraumatic. Oropharynx benign  Neck: Neck flexion and extension maneuvers with mild restriction , no carotid bruits  Neurologic Exam  Mental Status: Awake and fully alert. Oriented to place and time. Follows all commands. Speech and language normal.  Cranial Nerves: Pupils equal, briskly reactive to light. Extraocular movements full without nystagmus. Visual fields full to confrontation. Hearing intact and symmetric to finger snap. Facial sensation intact. Face, tongue, palate move normally and symmetrically. Neck flexion and extension normal.  Motor: Normal bulk and tone. Normal strength in all tested extremity muscles.No  focal weakness  Sensory.: intact to touch and pinprick and vibratory in the upper and lower extremities.  Coordination: Rapid alternating movements normal in all extremities. Finger-to-nose and heel-to-shin performed accurately bilaterally.  Gait and Station: Arises from chair without difficulty. Stance is normal. Gait demonstrates normal stride length and balance . Able to heel, toe and tandem walk without difficulty. Romberg negative  Reflexes: 2+ and symmetric. Toes downgoing.   DIAGNOSTIC DATA (LABS, IMAGING, TESTING) - I reviewed patient records, labs, notes, testing and imaging myself where available.  Lab Results  Component Value Date   WBC 5.3 05/01/2015   HGB 14.3 05/01/2015   HCT 40.4 05/01/2015   MCV 95.3 05/01/2015   PLT 285 05/01/2015      Component Value  Date/Time   NA 141 05/01/2015 0923   K 4.0 05/01/2015 0923   CL 103 05/01/2015 0923   CO2 27 05/01/2015 0923   GLUCOSE 83 05/01/2015 0923   BUN 13 05/01/2015 0923   CREATININE 0.87 05/01/2015 0923   CALCIUM 9.6 05/01/2015 0923   PROT 7.1 05/01/2015 0923   ALBUMIN 4.7 05/01/2015 0923   AST 28 05/01/2015 0923   ALT 23 05/01/2015 0923   ALKPHOS 65 05/01/2015 0923   BILITOT 0.4 05/01/2015 0923   GFRNONAA 57 (L) 10/25/2014 1007   GFRAA 66 10/25/2014 1007     ASSESSMENT AND PLAN 65 y.o. year old female has a past medical history of HIV (human immunodeficiency virus infection); Hypertension; Migraine; Spinal stenosis; and Neuropathy. here to followup. Her symptoms are currently well controlled on gabapentin and Flexeril.The patient is a current patient of Dr. Krista Blue  who is out of the office today . This note is sent to the work in doctor.     Continue gabapentin 2 caps daily will refill Continue Flexeril at night, will refill Continue neck exercises and exercises for vertigo Followup yearly and when necessary Dennie Bible, [facility], [facility], APRN  [facility] [address], [place], [place] [address] [phone]  Personally have participated in and made any corrections needed to history, physical, neuro exam,assessment and plan as stated above.  I have personally discussed with NP, evaluated lab date, reviewed imaging studies and agree with radiology interpretations.    Sarina Ill, MD [facility]

## 2015-10-16 ENCOUNTER — Ambulatory Visit (INDEPENDENT_AMBULATORY_CARE_PROVIDER_SITE_OTHER): Payer: BLUE CROSS/BLUE SHIELD | Admitting: Infectious Disease

## 2015-10-16 ENCOUNTER — Ambulatory Visit: Payer: BLUE CROSS/BLUE SHIELD | Admitting: Pharmacist

## 2015-10-16 ENCOUNTER — Encounter: Payer: Self-pay | Admitting: Infectious Disease

## 2015-10-16 VITALS — BP 142/84 | HR 95 | Temp 98.7°F | Wt 148.0 lb

## 2015-10-16 DIAGNOSIS — I10 Essential (primary) hypertension: Secondary | ICD-10-CM

## 2015-10-16 DIAGNOSIS — B20 Human immunodeficiency virus [HIV] disease: Secondary | ICD-10-CM

## 2015-10-16 DIAGNOSIS — R21 Rash and other nonspecific skin eruption: Secondary | ICD-10-CM | POA: Diagnosis not present

## 2015-10-16 DIAGNOSIS — E785 Hyperlipidemia, unspecified: Secondary | ICD-10-CM

## 2015-10-16 HISTORY — DX: Rash and other nonspecific skin eruption: R21

## 2015-10-16 LAB — CBC WITH DIFFERENTIAL/PLATELET
Basophils Absolute: 64 cells/uL (ref 0–200)
Basophils Relative: 1 %
Eosinophils Absolute: 320 cells/uL (ref 15–500)
Eosinophils Relative: 5 %
HCT: 42.5 % (ref 35.0–45.0)
Hemoglobin: 14.9 g/dL (ref 11.7–15.5)
Lymphocytes Relative: 41 %
Lymphs Abs: 2624 cells/uL (ref 850–3900)
MCH: 32.8 pg (ref 27.0–33.0)
MCHC: 35.1 g/dL (ref 32.0–36.0)
MCV: 93.6 fL (ref 80.0–100.0)
MPV: 9.6 fL (ref 7.5–12.5)
Monocytes Absolute: 576 cells/uL (ref 200–950)
Monocytes Relative: 9 %
Neutro Abs: 2816 cells/uL (ref 1500–7800)
Neutrophils Relative %: 44 %
Platelets: 308 10*3/uL (ref 140–400)
RBC: 4.54 MIL/uL (ref 3.80–5.10)
RDW: 13.2 % (ref 11.0–15.0)
WBC: 6.4 10*3/uL (ref 3.8–10.8)

## 2015-10-16 LAB — COMPLETE METABOLIC PANEL WITH GFR
ALT: 18 U/L (ref 6–29)
AST: 24 U/L (ref 10–35)
Albumin: 4.8 g/dL (ref 3.6–5.1)
Alkaline Phosphatase: 57 U/L (ref 33–130)
BILIRUBIN TOTAL: 0.4 mg/dL (ref 0.2–1.2)
BUN: 14 mg/dL (ref 7–25)
CO2: 26 mmol/L (ref 20–31)
CREATININE: 0.94 mg/dL (ref 0.50–0.99)
Calcium: 10.2 mg/dL (ref 8.6–10.4)
Chloride: 100 mmol/L (ref 98–110)
GFR, Est African American: 75 mL/min (ref >=60)
GFR, Est Non African American: 65 mL/min (ref >=60)
GLUCOSE: 75 mg/dL (ref 65–99)
Potassium: 4.7 mmol/L (ref 3.5–5.3)
SODIUM: 139 mmol/L (ref 135–146)
TOTAL PROTEIN: 7.4 g/dL (ref 6.1–8.1)

## 2015-10-16 MED ORDER — TRIAMCINOLONE ACETONIDE 0.5 % EX OINT: 1.0000 "application " | TOPICAL_OINTMENT | Freq: Two times a day (BID) | CUTANEOUS | 2 refills | Status: DC

## 2015-10-16 NOTE — Progress Notes (Signed)
Chief complaint: rash on chest and on inner thighs  Subjective:    Patient ID: [name], female    DOB: 07/01/1950, 65 y.o.   MRN: [id number]  HPI  65 year old Caucasian lady with perfectly controlled HIV on Genvoya. She has been on most meds for years with most recent drug being Crestor in April.  She went to the beach 2 weeks ago and applied a zinc cream to her chest and to her thighs to protect her from sunburn but had no rash at that time.   Over the past few days she has had outbreak of pruritic rash on chest and legs.   She has not started new other topical meds or had dietary changes or new detergents.  She has not been able to control his with benadryl. She denies being sexually active for months to years but agrees to RPR testing.  Past Medical History:  Diagnosis Date  . HIV (human immunodeficiency virus infection) ([place])   . Hypertension   . Migraine   . Neuropathy ([place])   . PONV (postoperative nausea and vomiting)    said usually has to have scop patch  . Rash and nonspecific skin eruption 10/16/2015  . Spinal stenosis     Past Surgical History:  Procedure Laterality Date  . ABDOMINAL HYSTERECTOMY  1990  . CARPAL TUNNEL RELEASE     both hands  . CERVICAL FUSION  1989  . TONSILLECTOMY      Family History  Problem Relation Age of Onset  . High blood pressure Mother   . High Cholesterol Mother   . Cancer Father   . High blood pressure Father   . High Cholesterol Father       Social History   Social History  . Marital status: Married    Spouse name: [name]  . Number of children: 1  . Years of education: 25   Occupational History  . Retired    Social History Main Topics  . Smoking status: Never Smoker  . Smokeless tobacco: Never Used  . Alcohol use 0.6 oz/week    1 Glasses of wine per week     Comment: on weekends  . Drug use: No  . Sexual activity: Yes     Comment: declined condoms   Other Topics Concern  . None   Social  History Narrative   Patient lives at home with her husband [name]). 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   Caffeine - None    Right handed.      No Known Allergies   Current Outpatient Prescriptions:  .  amLODipine (NORVASC) 5 MG tablet, Take 5 mg by mouth daily., Disp: , Rfl:  .  benazepril (LOTENSIN) 40 MG tablet, Take 40 mg by mouth daily.  , Disp: , Rfl:  .  Coenzyme Q10 300 MG CAPS, Take 300 capsules by mouth daily., Disp: , Rfl:  .  cyclobenzaprine (FLEXERIL) 10 MG tablet, Take 1 tablet (10 mg total) by mouth at bedtime., Disp: 90 tablet, Rfl: 3 .  elvitegravir-cobicistat-emtricitabine-tenofovir (GENVOYA) 150-150-200-10 MG TABS tablet, Take 1 tablet by mouth daily with breakfast., Disp: 90 tablet, Rfl: 4 .  gabapentin (NEURONTIN) 100 MG capsule, Take 1 capsule (100 mg total) by mouth 2 (two) times daily., Disp: 180 capsule, Rfl: 3 .  naproxen sodium (ANAPROX) 220 MG tablet, Take 220 mg by mouth daily. , Disp: , Rfl:  .  Omega-3 Fatty Acids (FISH OIL) 1200 MG CAPS, Take  1,200 mg by mouth 2 (two) times daily.  , Disp: , Rfl:  .  rosuvastatin (CRESTOR) 5 MG tablet, Take 1 tablet (5 mg total) by mouth at bedtime., Disp: 90 tablet, Rfl: 3 .  triamcinolone ointment (KENALOG) 0.5 %, Apply 1 application topically 2 (two) times daily., Disp: 30 g, Rfl: 2   Review of Systems  Constitutional: Negative for chills and fever.  HENT: Negative for congestion and sore throat.   Eyes: Negative for photophobia.  Respiratory: Negative for cough, shortness of breath and wheezing.   Cardiovascular: Negative for chest pain, palpitations and leg swelling.  Gastrointestinal: Negative for abdominal pain, blood in stool, constipation, diarrhea, nausea and vomiting.  Genitourinary: Negative for dysuria, flank pain and hematuria.  Musculoskeletal: Negative for back pain and myalgias.  Skin: Positive for rash.  Neurological: Negative for dizziness, weakness and headaches.  Hematological:  Does not bruise/bleed easily.  Psychiatric/Behavioral: Negative for suicidal ideas.      Objective:   Physical Exam  Constitutional: She is oriented to person, place, and time. She appears well-developed and well-nourished. No distress.  HENT:  Head: Normocephalic and atraumatic.  Mouth/Throat: No oropharyngeal exudate.  Eyes: Conjunctivae and EOM are normal. No scleral icterus.  Neck: Normal range of motion. Neck supple.  Cardiovascular: Normal rate and regular rhythm.   Pulmonary/Chest: Effort normal. No respiratory distress. She has no wheezes.  Abdominal: She exhibits no distension.  Musculoskeletal: She exhibits no edema or tenderness.  Neurological: She is alert and oriented to person, place, and time. She exhibits normal muscle tone. Coordination normal.  Skin: Skin is warm and dry. Rash noted. She is not diaphoretic. No erythema. No pallor.  Psychiatric: She has a normal mood and affect. Her behavior is normal. Judgment and thought content normal.   Pruritic confluent rash on chest and inner thighs bilaterally       Assessment & Plan:   Rash: seems likely to be due to environmental allergens or heat induced rash. We will check CBC c diff, CMP, RPR. We will try some topical triamcinolone.   HIV: continue genvoya  HTN: on Norvasc. Will not mess with this right now Vitals:   10/16/15 1048  BP: (!) 142/84  Pulse: 95  Temp: 98.7 F (37.1 C)   Hyperlipidemia: on statin but IF we think she has drug induced rash will stop this   I spent greater than 25 minutes with the patient including greater than 50% of time in face to face counsel of the patient re her rash, HIV, HTN, hyperlipidemia and in coordination of her  care.

## 2015-10-16 NOTE — Progress Notes (Signed)
HPI: [name] is a 65 y.o. female who presents as a walk in today to see pharmacy for a rash.   Allergies: No Known Allergies  Past Medical History: Past Medical History:  Diagnosis Date  . HIV (human immunodeficiency virus infection) ([place])   . Hypertension   . Migraine   . Neuropathy ([place])   . PONV (postoperative nausea and vomiting)    said usually has to have scop patch  . Rash and nonspecific skin eruption 10/16/2015  . Spinal stenosis     Social History: Social History   Social History  . Marital status: Married    Spouse name: [name]  . Number of children: 1  . Years of education: 52   Occupational History  . Retired    Social History Main Topics  . Smoking status: Never Smoker  . Smokeless tobacco: Never Used  . Alcohol use 0.6 oz/week    1 Glasses of wine per week     Comment: on weekends  . Drug use: No  . Sexual activity: Yes     Comment: declined condoms   Other Topics Concern  . Not on file   Social History Narrative   Patient lives at home with her husband [name]). 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   Caffeine - None    Right handed.      Current Regimen: Genvoya  Labs: HIV 1 RNA Quant (copies/mL)  Date Value  05/01/2015 <20  10/25/2014 <20  04/04/2014 <20   CD4 T Cell Abs (/uL)  Date Value  05/01/2015 880  10/25/2014 980  04/04/2014 830   Hep B S Ab (no units)  Date Value  03/24/2006 No   Hepatitis B Surface Ag (no units)  Date Value  03/24/2006 No   HCV Ab (no units)  Date Value  03/24/2006 No    CrCl: CrCl cannot be calculated (Patient's most recent lab result is older than the maximum 21 days allowed.).  Lipids:    Component Value Date/Time   CHOL 345 (H) 05/01/2015 0923   TRIG 200 (H) 05/01/2015 0923   HDL 67 05/01/2015 0923   CHOLHDL 5.1 (H) 05/01/2015 0923   VLDL 40 (H) 05/01/2015 0923   LDLCALC 238 (H) 05/01/2015 0923    Assessment: [name] comes in today as a walk in complaining  of a new onset rash.  She has been taking Genvoya for years and recently started on Crestor back in April. She has not started on any new medications since then, no new detergents, foods, soaps, shampoo, or anything else. Her rash looks pruritic, so I had her see Dr. Tommy Medal who had an opening.   Plans: - See Dr. Tommy Medal regarding rash  Cassie L. Donnajean Lopes, PharmD [facility] 10/16/2015, 12:12 PM

## 2015-10-17 LAB — RPR

## 2015-10-17 LAB — HIV-1 RNA QUANT-NO REFLEX-BLD
HIV 1 RNA Quant: <20 copies/mL (ref <20)
HIV-1 RNA Quant, Log: <1.3 Log copies/mL (ref <1.30)

## 2015-10-17 LAB — T-HELPER CELL (CD4) - (RCID CLINIC ONLY)
CD4 T CELL ABS: 890 /uL (ref 400–2700)
CD4 T CELL HELPER: 35 % (ref 33–55)

## 2015-10-24 ENCOUNTER — Telehealth: Payer: Self-pay | Admitting: *Deleted

## 2015-10-24 DIAGNOSIS — R21 Rash and other nonspecific skin eruption: Secondary | ICD-10-CM

## 2015-10-24 MED ORDER — TRIAMCINOLONE ACETONIDE 0.5 % EX OINT: 1.0000 "application " | TOPICAL_OINTMENT | Freq: Two times a day (BID) | CUTANEOUS | 2 refills | Status: DC

## 2015-10-24 NOTE — Telephone Encounter (Signed)
Patient states that she only received a small quantity of the ointment.  She has already run out of the ointment and she is beginning to she improvement in the rash.  She is requesting a larger quantity to see complete improvement.

## 2015-10-24 NOTE — Telephone Encounter (Signed)
Fine to give her a larger amount. Can we just put QS one month. She should only be applying this twice daily.

## 2015-10-25 NOTE — Telephone Encounter (Signed)
Done.  She is using twice daily as prescribed.

## 2015-11-07 ENCOUNTER — Other Ambulatory Visit: Payer: Self-pay | Admitting: Internal Medicine

## 2015-11-07 DIAGNOSIS — Z1231 Encounter for screening mammogram for malignant neoplasm of breast: Secondary | ICD-10-CM

## 2015-11-09 ENCOUNTER — Other Ambulatory Visit: Payer: BLUE CROSS/BLUE SHIELD

## 2015-11-09 DIAGNOSIS — E7849 Other hyperlipidemia: Secondary | ICD-10-CM

## 2015-11-09 LAB — LIPID PANEL
Cholesterol: 199 mg/dL (ref 125–200)
HDL: 73 mg/dL (ref >=46)
LDL Cholesterol: 99 mg/dL (ref <130)
Total CHOL/HDL Ratio: 2.7 Ratio (ref <=5.0)
Triglycerides: 135 mg/dL (ref <150)
VLDL: 27 mg/dL (ref <30)

## 2015-11-23 ENCOUNTER — Ambulatory Visit (INDEPENDENT_AMBULATORY_CARE_PROVIDER_SITE_OTHER): Payer: BLUE CROSS/BLUE SHIELD | Admitting: Internal Medicine

## 2015-11-23 ENCOUNTER — Encounter: Payer: Self-pay | Admitting: Internal Medicine

## 2015-11-23 VITALS — BP 134/77 | HR 92 | Temp 97.2°F | Wt 148.0 lb

## 2015-11-23 DIAGNOSIS — B20 Human immunodeficiency virus [HIV] disease: Secondary | ICD-10-CM | POA: Diagnosis not present

## 2015-11-23 DIAGNOSIS — Z23 Encounter for immunization: Secondary | ICD-10-CM | POA: Diagnosis not present

## 2015-11-23 DIAGNOSIS — E7800 Pure hypercholesterolemia, unspecified: Secondary | ICD-10-CM

## 2015-11-23 DIAGNOSIS — J301 Allergic rhinitis due to pollen: Secondary | ICD-10-CM

## 2015-11-23 DIAGNOSIS — I10 Essential (primary) hypertension: Secondary | ICD-10-CM | POA: Diagnosis not present

## 2015-11-23 MED ORDER — FLUNISOLIDE 25 MCG/ACT (0.025%) NA SOLN: 2.0000 | Freq: Two times a day (BID) | NASAL | 1 refills | Status: DC

## 2015-11-23 NOTE — Progress Notes (Signed)
RFV: hiv disease follow up  Patient ID: [name], female   DOB: Mar 15, 1950, 65 y.o.   MRN: [id number]  HPI 65yo F with HIV disease, HTN, HLD, currently on genvoya.She has now been on crestor 5mg  daily for 6 months. She has been doing well, but she has noticed that she has been forgetful, which is one of the side effects she had in the past with lipid lowering agents. Now requiring to make a grocery list in the past could remember everything  MMSE showed 29/30.  Missing one object at recall  Now having allergic rhinitis with Fall,   Her pcp has retired, and would like to see [name] for primary care  She gets benazepril 40mg  daily, amlodipine 5 mg daily from her pcp  Outpatient Encounter Prescriptions as of 11/23/2015  Medication Sig  . amLODipine (NORVASC) 5 MG tablet Take 5 mg by mouth daily.  . benazepril (LOTENSIN) 40 MG tablet Take 40 mg by mouth daily.    . Coenzyme Q10 300 MG CAPS Take 300 capsules by mouth daily.  . cyclobenzaprine (FLEXERIL) 10 MG tablet Take 1 tablet (10 mg total) by mouth at bedtime.  . elvitegravir-cobicistat-emtricitabine-tenofovir (GENVOYA) 150-150-200-10 MG TABS tablet Take 1 tablet by mouth daily with breakfast.  . gabapentin (NEURONTIN) 100 MG capsule Take 1 capsule (100 mg total) by mouth 2 (two) times daily.  . naproxen sodium (ANAPROX) 220 MG tablet Take 220 mg by mouth daily.   . Omega-3 Fatty Acids (FISH OIL) 1200 MG CAPS Take 1,200 mg by mouth 2 (two) times daily.    . rosuvastatin (CRESTOR) 5 MG tablet Take 1 tablet (5 mg total) by mouth at bedtime.  . triamcinolone ointment (KENALOG) 0.5 % Apply 1 application topically 2 (two) times daily.   No facility-administered encounter medications on file as of 11/23/2015.      Patient Active Problem List   Diagnosis Date Noted  . Rash and nonspecific skin eruption 10/16/2015  . Hereditary and idiopathic peripheral neuropathy 07/16/2012  . Lumbar neuritis 07/16/2012  . Cervicalgia  07/16/2012  . URI 12/02/2006  . HIV DISEASE 11/08/2005  . HYPERLIPIDEMIA 11/08/2005  . Essential hypertension 11/08/2005  . BRONCHITIS 11/08/2005  . CROHN'S DISEASE 11/08/2005  . SYMPTOM, PAINFUL RESPIRATION 11/08/2005     Health Maintenance Due  Topic Date Due  . COLONOSCOPY  11/20/2001  . PAP SMEAR  12/17/2010  . ZOSTAVAX  11/21/2011  . INFLUENZA VACCINE  08/29/2015     Review of Systems Review of Systems  Constitutional: Negative for fever, chills, diaphoresis, activity change, appetite change, fatigue and unexpected weight change.  HENT: Negative for congestion, sore throat, rhinorrhea, sneezing, trouble swallowing and sinus pressure.  Eyes: Negative for photophobia and visual disturbance.  Respiratory: Negative for cough, chest tightness, shortness of breath, wheezing and stridor.  Cardiovascular: Negative for chest pain, palpitations and leg swelling.  Gastrointestinal: Negative for nausea, vomiting, abdominal pain, diarrhea, constipation, blood in stool, abdominal distention and anal bleeding.  Genitourinary: Negative for dysuria, hematuria, flank pain and difficulty urinating.  Musculoskeletal: Negative for myalgias, back pain, joint swelling, arthralgias and gait problem.  Skin: Negative for color change, pallor, rash and wound.  Neurological: +forgetfulness  Hematological: Negative for adenopathy. Does not bruise/bleed easily.  Psychiatric/Behavioral: Negative for behavioral problems, confusion, sleep disturbance, dysphoric mood, decreased concentration and agitation.    Physical Exam   BP 134/77   Pulse 92   Temp 97.2 F (36.2 C) (Oral)   Wt 148 lb (67.1 kg)  BMI 24.63 kg/m  Physical Exam  Constitutional:  oriented to person, place, and time. appears well-developed and well-nourished. No distress.  HENT: [place]/AT, PERRLA, no scleral icterus Mouth/Throat: Oropharynx is clear and moist. No oropharyngeal exudate.  Cardiovascular: Normal rate, regular rhythm and  normal heart sounds. Exam reveals no gallop and no friction rub.  No murmur heard.  Pulmonary/Chest: Effort normal and breath sounds normal. No respiratory distress.  has no wheezes.  Neck = supple, no nuchal rigidity Abdominal: Soft. Bowel sounds are normal.  exhibits no distension. There is no tenderness.  Lymphadenopathy: no cervical adenopathy. No axillary adenopathy Neurological: alert and oriented to person, place, and time.  Skin: Skin is warm and dry. No rash noted. No erythema.  Psychiatric: a normal mood and affect.  behavior is normal.   Lab Results  Component Value Date   CD4TCELL 35 10/16/2015   Lab Results  Component Value Date   CD4TABS 890 10/16/2015   CD4TABS 880 05/01/2015   CD4TABS 980 10/25/2014   Lab Results  Component Value Date   HIV1RNAQUANT <20 10/16/2015   Lab Results  Component Value Date   HEPBSAB No 03/24/2006   No results found for: RPR  CBC Lab Results  Component Value Date   WBC 6.4 10/16/2015   RBC 4.54 10/16/2015   HGB 14.9 10/16/2015   HCT 42.5 10/16/2015   PLT 308 10/16/2015   MCV 93.6 10/16/2015   MCH 32.8 10/16/2015   MCHC 35.1 10/16/2015   RDW 13.2 10/16/2015   LYMPHSABS 2,624 10/16/2015   MONOABS 576 10/16/2015   EOSABS 320 10/16/2015   BASOSABS 64 10/16/2015   BMET Lab Results  Component Value Date   NA 139 10/16/2015   K 4.7 10/16/2015   CL 100 10/16/2015   CO2 26 10/16/2015   GLUCOSE 75 10/16/2015   BUN 14 10/16/2015   CREATININE 0.94 10/16/2015   CALCIUM 10.2 10/16/2015   GFRNONAA 65 10/16/2015   GFRAA 75 10/16/2015     Assessment and Plan   htn = we will refill her meds when she calls next month  hld = she is now under good control with taking crestor over the last 6 months. continue crestor 5mg  and fish oil  Difficulty with memory = appears very subtle, MMSE did not reveal any significant deficit. We will continue to follow her symptoms. If it worsens, can consider stopping crestor  hiv = well  controlled, and will continue with genvoya. Will give copay card since getting new insurance  Health maintenance = will give flu shot  Rash = improved with steroid cream  Allergic rhinitis = will give rx for  flunisolide

## 2015-12-01 ENCOUNTER — Other Ambulatory Visit: Payer: Self-pay | Admitting: Internal Medicine

## 2015-12-01 DIAGNOSIS — N63 Unspecified lump in unspecified breast: Secondary | ICD-10-CM

## 2015-12-04 ENCOUNTER — Other Ambulatory Visit: Payer: Self-pay

## 2015-12-04 ENCOUNTER — Other Ambulatory Visit: Payer: Self-pay | Admitting: Internal Medicine

## 2015-12-04 DIAGNOSIS — N63 Unspecified lump in unspecified breast: Secondary | ICD-10-CM

## 2015-12-05 ENCOUNTER — Ambulatory Visit
Admission: RE | Admit: 2015-12-05 | Discharge: 2015-12-05 | Disposition: A | Discharge status: Home / Self Care | Payer: BLUE CROSS/BLUE SHIELD | Source: Ambulatory Visit | Attending: Internal Medicine | Admitting: Internal Medicine

## 2015-12-05 ENCOUNTER — Other Ambulatory Visit: Payer: Self-pay | Admitting: Internal Medicine

## 2015-12-05 DIAGNOSIS — R229 Localized swelling, mass and lump, unspecified: Principal | ICD-10-CM

## 2015-12-05 DIAGNOSIS — IMO0002 Reserved for concepts with insufficient information to code with codable children: Secondary | ICD-10-CM

## 2015-12-05 DIAGNOSIS — N63 Unspecified lump in unspecified breast: Secondary | ICD-10-CM

## 2015-12-06 ENCOUNTER — Other Ambulatory Visit: Payer: Self-pay | Admitting: Internal Medicine

## 2015-12-06 DIAGNOSIS — IMO0002 Reserved for concepts with insufficient information to code with codable children: Secondary | ICD-10-CM

## 2015-12-06 DIAGNOSIS — R229 Localized swelling, mass and lump, unspecified: Principal | ICD-10-CM

## 2015-12-07 ENCOUNTER — Ambulatory Visit
Admission: RE | Admit: 2015-12-07 | Discharge: 2015-12-07 | Disposition: A | Discharge status: Home / Self Care | Payer: BLUE CROSS/BLUE SHIELD | Source: Ambulatory Visit | Attending: Internal Medicine | Admitting: Internal Medicine

## 2015-12-07 DIAGNOSIS — R229 Localized swelling, mass and lump, unspecified: Principal | ICD-10-CM

## 2015-12-07 DIAGNOSIS — IMO0002 Reserved for concepts with insufficient information to code with codable children: Secondary | ICD-10-CM

## 2015-12-07 HISTORY — PX: BREAST BIOPSY: SHX20

## 2015-12-11 ENCOUNTER — Telehealth: Payer: Self-pay

## 2015-12-11 ENCOUNTER — Telehealth: Payer: Self-pay | Admitting: *Deleted

## 2015-12-11 ENCOUNTER — Encounter: Payer: Self-pay | Admitting: *Deleted

## 2015-12-11 DIAGNOSIS — Z171 Estrogen receptor negative status [ER-]: Secondary | ICD-10-CM

## 2015-12-11 DIAGNOSIS — C50412 Malignant neoplasm of upper-outer quadrant of left female breast: Secondary | ICD-10-CM

## 2015-12-11 HISTORY — DX: Malignant neoplasm of upper-outer quadrant of left female breast: C50.412

## 2015-12-11 NOTE — Telephone Encounter (Signed)
Patient calling with updated information. She has been diagnosed with breast cancer and will see the surgeon on Wednesday at [facility] .  She has several questions related to Cancer, HIV and medications..    I advised patient the Cancer physicians should be able to answer her questions and concerns.  I will forward this information to Dr Baxter Flattery .  Laverle Patter, RN

## 2015-12-11 NOTE — Telephone Encounter (Signed)
Confirmed [facility] for 12/13/15 at 0815 .  Instructions and contact information given.

## 2015-12-13 ENCOUNTER — Ambulatory Visit
Admission: RE | Admit: 2015-12-13 | Discharge: 2015-12-13 | Disposition: A | Discharge status: Home / Self Care | Payer: BLUE CROSS/BLUE SHIELD | Source: Ambulatory Visit | Attending: Radiation Oncology | Admitting: Radiation Oncology

## 2015-12-13 ENCOUNTER — Encounter: Payer: Self-pay | Admitting: *Deleted

## 2015-12-13 ENCOUNTER — Encounter: Payer: Self-pay | Admitting: Physical Therapy

## 2015-12-13 ENCOUNTER — Ambulatory Visit: Payer: BLUE CROSS/BLUE SHIELD | Attending: Surgery | Admitting: Physical Therapy

## 2015-12-13 ENCOUNTER — Ambulatory Visit (HOSPITAL_BASED_OUTPATIENT_CLINIC_OR_DEPARTMENT_OTHER): Payer: BLUE CROSS/BLUE SHIELD | Admitting: Oncology

## 2015-12-13 ENCOUNTER — Telehealth: Payer: Self-pay | Admitting: *Deleted

## 2015-12-13 ENCOUNTER — Other Ambulatory Visit (HOSPITAL_BASED_OUTPATIENT_CLINIC_OR_DEPARTMENT_OTHER): Payer: BLUE CROSS/BLUE SHIELD

## 2015-12-13 ENCOUNTER — Other Ambulatory Visit: Payer: Self-pay | Admitting: Surgery

## 2015-12-13 ENCOUNTER — Encounter: Payer: Self-pay | Admitting: Oncology

## 2015-12-13 VITALS — BP 138/69 | HR 95 | Temp 97.7°F | Resp 18 | Ht 65.0 in | Wt 148.6 lb

## 2015-12-13 DIAGNOSIS — Z171 Estrogen receptor negative status [ER-]: Secondary | ICD-10-CM

## 2015-12-13 DIAGNOSIS — M25511 Pain in right shoulder: Secondary | ICD-10-CM | POA: Diagnosis present

## 2015-12-13 DIAGNOSIS — C50412 Malignant neoplasm of upper-outer quadrant of left female breast: Secondary | ICD-10-CM | POA: Insufficient documentation

## 2015-12-13 DIAGNOSIS — C50912 Malignant neoplasm of unspecified site of left female breast: Secondary | ICD-10-CM

## 2015-12-13 DIAGNOSIS — R293 Abnormal posture: Secondary | ICD-10-CM

## 2015-12-13 DIAGNOSIS — G8929 Other chronic pain: Secondary | ICD-10-CM | POA: Diagnosis present

## 2015-12-13 LAB — COMPREHENSIVE METABOLIC PANEL
ALT: 17 U/L (ref 0–55)
ANION GAP: 11 meq/L (ref 3–11)
AST: 23 U/L (ref 5–34)
Albumin: 4.1 g/dL (ref 3.5–5.0)
Alkaline Phosphatase: 70 U/L (ref 40–150)
BUN: 14 mg/dL (ref 7.0–26.0)
CHLORIDE: 105 meq/L (ref 98–109)
CO2: 27 meq/L (ref 22–29)
CREATININE: 0.9 mg/dL (ref 0.6–1.1)
Calcium: 10.4 mg/dL (ref 8.4–10.4)
EGFR: 68 mL/min/{1.73_m2} — ABNORMAL LOW (ref >90)
Glucose: 65 mg/dl — ABNORMAL LOW (ref 70–140)
Potassium: 4 mEq/L (ref 3.5–5.1)
SODIUM: 142 meq/L (ref 136–145)
Total Bilirubin: 0.46 mg/dL (ref 0.20–1.20)
Total Protein: 7.9 g/dL (ref 6.4–8.3)

## 2015-12-13 LAB — CBC WITH DIFFERENTIAL/PLATELET
BASO%: 1 % (ref 0.0–2.0)
Basophils Absolute: 0.1 10*3/uL (ref 0.0–0.1)
EOS%: 1.8 % (ref 0.0–7.0)
Eosinophils Absolute: 0.1 10*3/uL (ref 0.0–0.5)
HCT: 42.2 % (ref 34.8–46.6)
HGB: 14.7 g/dL (ref 11.6–15.9)
LYMPH%: 31.2 % (ref 14.0–49.7)
MCH: 33.3 pg (ref 25.1–34.0)
MCHC: 34.8 g/dL (ref 31.5–36.0)
MCV: 95.5 fL (ref 79.5–101.0)
MONO#: 0.7 10*3/uL (ref 0.1–0.9)
MONO%: 9 % (ref 0.0–14.0)
NEUT#: 4.2 10*3/uL (ref 1.5–6.5)
NEUT%: 57 % (ref 38.4–76.8)
PLATELETS: 286 10*3/uL (ref 145–400)
RBC: 4.42 10*6/uL (ref 3.70–5.45)
RDW: 12.8 % (ref 11.2–14.5)
WBC: 7.4 10*3/uL (ref 3.9–10.3)
lymph#: 2.3 10*3/uL (ref 0.9–3.3)

## 2015-12-13 NOTE — Progress Notes (Signed)
Clinical Social Work [facility] Psychosocial Distress Screening [name]  Patient completed distress screening protocol and scored a 6 on the Psychosocial Distress Thermometer which indicates moderate distress. Clinical Social Worker met with patient and patients family in [facility] to assess for distress and other psychosocial needs. Patient stated she was feeling overwhelmed but felt "better" after meeting with the treatment team and getting more information on her treatment plan. CSW and patient discussed common feeling and emotions when being diagnosed with cancer, and the importance of support during treatment. CSW informed patient of the support team and support services at [facility]. CSW provided contact information and encouraged patient to call with any questions or concerns.  ONCBCN DISTRESS SCREENING 12/13/2015  Screening Type Initial Screening  Distress experienced in past week (1-10) 6  Emotional problem type Nervousness/Anxiety;Adjusting to illness  Physical Problem type Pain  Physician notified of physical symptoms Yes     [name], MSW, LCSW, OSW-C Clinical Social Worker [place] [phone]

## 2015-12-13 NOTE — Telephone Encounter (Signed)
Ordered mammaprint on core bx per Dr. Jana Hakim.  Faxed requisition to [facility] and pathology and confirmed receipt.

## 2015-12-13 NOTE — Progress Notes (Signed)
Nutrition Assessment  Reason for Assessment:  Pt seen in Breast Clinic  ASSESSMENT:   65 year old female with new diagnosis of left breast cancer. Past medical history of HIV+, HTN, crohn's disease, HLD  Patient reports normal appetite and stable weight prior to visit today  Medications:  reviewed  Labs: reviewed  Anthropometrics:   Height: 65 inches Weight: 148 pound 9.6 oz BMI: 24.73   NUTRITION DIAGNOSIS: Food and nutrition related knowledge deficit related to new diagnosis of breast cancer as evidenced by no prior need for nutrition related information.  INTERVENTION:   Discussed and provided packet of information regarding nutritional tips for breast cancer patients.  Questions answered.  Teachback method used.      MONITORING, EVALUATION, and GOAL: Pt will consume a healthy plant based diet to maintain lean body mass throughout treatment.   [name] B. Zenia Resides, [place], [place] (pager)

## 2015-12-13 NOTE — Patient Instructions (Signed)

## 2015-12-13 NOTE — Progress Notes (Signed)
Radiation Oncology         [phone] ________________________________  Initial Outpatient Consultation  Name: [name] MRN: [id number]  Date: 12/13/2015  DOB: 1950-04-13  XN:[id number],KGURK A, MD  Alphonsa Overall, MD   REFERRING PHYSICIAN: Alphonsa Overall, MD  DIAGNOSIS:    ICD-9-CM ICD-10-CM   1. Malignant neoplasm of upper-outer quadrant of left breast in female, estrogen receptor negative (HCC) 174.4 C50.412    V86.1 Z17.1    Clinical Stage IIA (T2 N0) Left Breast UOQ Invasive Ductal Carcinoma, ER- / PR- / Her2-, Grade 3  CHIEF COMPLAINT: Here to discuss management of left breast cancer  HISTORY OF PRESENT ILLNESS::[name] is a 65 y.o. female who presented with a palpable left breast lump for 4 days.  Biopsy on 12/08/15 showed a 2.1 cm mass at the 12:30 region with characteristics as described above in the diagnosis. Receptor status is ER (0%), PR (0%), Her2 (-), and Ki67 (35%).  Patient is positive for a stabbing pain in the left breast area. She is positive for glasses, sinus problems, chest pain, breast lump, joint pain, arthritis, headaches, and HIV.  PREVIOUS RADIATION THERAPY: No  PAST MEDICAL HISTORY:  has a past medical history of HIV (human immunodeficiency virus infection) ([place]); Hypertension; Malignant neoplasm of upper-outer quadrant of left female breast ([place]) (12/11/2015); Migraine; Neuropathy ([place]); PONV (postoperative nausea and vomiting); Rash and nonspecific skin eruption (10/16/2015); and Spinal stenosis.    PAST SURGICAL HISTORY: Past Surgical History:  Procedure Laterality Date  . ABDOMINAL HYSTERECTOMY  1990  . CARPAL TUNNEL RELEASE     both hands  . CERVICAL FUSION  1989  . TONSILLECTOMY      FAMILY HISTORY: family history includes Breast cancer (age of onset: 40) in her maternal aunt; Cancer in her father; High Cholesterol in her father and mother; High blood pressure in her father and mother; Lung cancer in her maternal  grandfather.  SOCIAL HISTORY:  reports that she has never smoked. She has never used smokeless tobacco. She reports that she drinks about 0.6 oz of alcohol per week . She reports that she does not use drugs.  ALLERGIES: Patient has no known allergies.  MEDICATIONS:  Current Outpatient Prescriptions  Medication Sig Dispense Refill  . amLODipine (NORVASC) 5 MG tablet Take 5 mg by mouth daily.    . benazepril (LOTENSIN) 40 MG tablet Take 40 mg by mouth daily.      . Coenzyme Q10 300 MG CAPS Take 300 capsules by mouth daily.    . cyclobenzaprine (FLEXERIL) 10 MG tablet Take 1 tablet (10 mg total) by mouth at bedtime. 90 tablet 3  . elvitegravir-cobicistat-emtricitabine-tenofovir (GENVOYA) 150-150-200-10 MG TABS tablet Take 1 tablet by mouth daily with breakfast. 90 tablet 4  . flunisolide (NASALIDE) 25 MCG/ACT (0.025%) SOLN Place 2 sprays into the nose 2 (two) times daily. 1 Bottle 1  . gabapentin (NEURONTIN) 100 MG capsule Take 1 capsule (100 mg total) by mouth 2 (two) times daily. 180 capsule 3  . naproxen sodium (ANAPROX) 220 MG tablet Take 220 mg by mouth daily.     . Omega-3 Fatty Acids (FISH OIL) 1200 MG CAPS Take 1,200 mg by mouth 2 (two) times daily.      . rosuvastatin (CRESTOR) 5 MG tablet Take 1 tablet (5 mg total) by mouth at bedtime. 90 tablet 3  . triamcinolone ointment (KENALOG) 0.5 % Apply 1 application topically 2 (two) times daily. 60 g 2   No current facility-administered medications for this  encounter.     REVIEW OF SYSTEMS:  Notable for that above. A 15 point review of systems was obtained. All pertinent positives noted in the HPI, all others are negative.   PHYSICAL EXAM:  Vitals with BMI 12/13/2015  Height 5' 5"  Weight 148 lbs 10 oz  BMI 18.8  Systolic 416  Diastolic 69  Pulse 95  Respirations 18   Lungs are clear to auscultation bilaterally. Heart has regular rate and rhythm. No palpable cervical, supraclavicular, or axillary adenopathy. Abdomen soft,  non-tender, normal bowel sounds. Right Breast: large and pendulous without mass or nipple discharge.  Left Breast: extensive bruising in upper aspect of breast. Approximately at the 12:30 position, 5 cm from areolar border, there is an approximately 2 cm palpable superficial mass with no obvious skin involvement, mobile. No other palpable masses appreciated in the breasts or axillae.  ECOG = 1   LABORATORY DATA:  Lab Results  Component Value Date   WBC 7.4 12/13/2015   HGB 14.7 12/13/2015   HCT 42.2 12/13/2015   MCV 95.5 12/13/2015   PLT 286 12/13/2015   CMP     Component Value Date/Time   NA 142 12/13/2015 0815   K 4.0 12/13/2015 0815   CL 100 10/16/2015 1103   CO2 27 12/13/2015 0815   GLUCOSE 65 (L) 12/13/2015 0815   BUN 14.0 12/13/2015 0815   CREATININE 0.9 12/13/2015 0815   CALCIUM 10.4 12/13/2015 0815   PROT 7.9 12/13/2015 0815   ALBUMIN 4.1 12/13/2015 0815   AST 23 12/13/2015 0815   ALT 17 12/13/2015 0815   ALKPHOS 70 12/13/2015 0815   BILITOT 0.46 12/13/2015 0815   GFRNONAA 65 10/16/2015 1103   GFRAA 75 10/16/2015 1103         RADIOGRAPHY: US Breast Ltd Uni Left Inc Axilla  Result Date: 12/05/2015 CLINICAL DATA:  Mass felt by the patient in the upper outer left breast for the past 4 days. EXAM: 2D DIGITAL DIAGNOSTIC BILATERAL MAMMOGRAM WITH CAD AND ADJUNCT TOMO ULTRASOUND LEFT BREAST COMPARISON:  Previous exam(s). ACR Breast Density Category c: The breast tissue is heterogeneously dense, which may obscure small masses. FINDINGS: 2D and 3D tomographic images of the breasts were obtained. These demonstrate an oval, circumscribed mass with macrolobulated contours in the upper outer left breast, anteriorly. This corresponds to the palpable mass, marked with a metallic marker. There are no findings elsewhere in either breast suspicious for malignancy. Mammographic images were processed with CAD. On physical exam, the patient has an approximately 1 cm faintly palpable mass  in the 12:30 o'clock position of the left breast, 7 cm from the nipple. There are no palpable left axillary lymph nodes. Targeted ultrasound is performed, showing a 2.1 x 1.6 x 1.1 cm oval, obliquely oriented, hypoechoic mass with macro lobulated margins in the 12:30 o'clock position of the left breast, 7 cm from the nipple. This has poorly defined margins and ill-defined surrounding increased echogenicity. Ultrasound of the left axilla demonstrated normal appearing left axillary lymph nodes. IMPRESSION: 2.1 cm mass in the 12:30 o'clock position of the left breast with imaging features suspicious for malignancy. RECOMMENDATION: Ultrasound-guided core needle biopsy of the 2.1 cm mass in the 12:30 o'clock position of the left breast. I have discussed the findings and recommendations with the patient. Results were also provided in writing at the conclusion of the visit. If applicable, a reminder letter will be sent to the patient regarding the next appointment. BI-RADS CATEGORY  4: Suspicious. Electronically Signed  By: Claudie Revering M.D.   On: 12/05/2015 12:36   Mm Diag Breast Tomo Bilateral  Result Date: 12/05/2015 CLINICAL DATA:  Mass felt by the patient in the upper outer left breast for the past 4 days. EXAM: 2D DIGITAL DIAGNOSTIC BILATERAL MAMMOGRAM WITH CAD AND ADJUNCT TOMO ULTRASOUND LEFT BREAST COMPARISON:  Previous exam(s). ACR Breast Density Category c: The breast tissue is heterogeneously dense, which may obscure small masses. FINDINGS: 2D and 3D tomographic images of the breasts were obtained. These demonstrate an oval, circumscribed mass with macrolobulated contours in the upper outer left breast, anteriorly. This corresponds to the palpable mass, marked with a metallic marker. There are no findings elsewhere in either breast suspicious for malignancy. Mammographic images were processed with CAD. On physical exam, the patient has an approximately 1 cm faintly palpable mass in the 12:30 o'clock  position of the left breast, 7 cm from the nipple. There are no palpable left axillary lymph nodes. Targeted ultrasound is performed, showing a 2.1 x 1.6 x 1.1 cm oval, obliquely oriented, hypoechoic mass with macro lobulated margins in the 12:30 o'clock position of the left breast, 7 cm from the nipple. This has poorly defined margins and ill-defined surrounding increased echogenicity. Ultrasound of the left axilla demonstrated normal appearing left axillary lymph nodes. IMPRESSION: 2.1 cm mass in the 12:30 o'clock position of the left breast with imaging features suspicious for malignancy. RECOMMENDATION: Ultrasound-guided core needle biopsy of the 2.1 cm mass in the 12:30 o'clock position of the left breast. I have discussed the findings and recommendations with the patient. Results were also provided in writing at the conclusion of the visit. If applicable, a reminder letter will be sent to the patient regarding the next appointment. BI-RADS CATEGORY  4: Suspicious. Electronically Signed   By: Claudie Revering M.D.   On: 12/05/2015 12:36   Mm Clip Placement Left  Result Date: 12/07/2015 CLINICAL DATA:  Post ultrasound-guided core needle biopsy of left 12:30 o'clock breast mass. EXAM: DIAGNOSTIC LEFT MAMMOGRAM POST ULTRASOUND BIOPSY COMPARISON:  Previous exam(s). FINDINGS: Mammographic images were obtained following ultrasound guided biopsy of left breast mass. Two-view mammography demonstrates presence of ribbon shaped marker within the biopsied mass. No apparent complications. IMPRESSION: Successful placement of ribbon shaped marker within left breast 12:30 o'clock breast mass, post ultrasound-guided core needle biopsy. Final Assessment: Post Procedure Mammograms for Marker Placement Electronically Signed   By: Fidela Salisbury M.D.   On: 12/07/2015 08:59   [name] Lt Breast Bx W Loc Dev 1st Lesion Img Bx Spec US Guide  Addendum Date: 12/10/2015   ADDENDUM REPORT: 12/08/2015 14:33 ADDENDUM: Pathology  revealed GRADE III INVASIVE DUCTAL CARCINOMA, DUCTAL CARCINOMA IN SITU of the Left breast at the 12:30 o'clock location. This was found to be concordant by Dr. Fidela Salisbury. Pathology results were discussed with the patient by telephone. The patient reported doing well after the biopsy with tenderness at the site. Post biopsy instructions and care were reviewed and questions were answered. The patient was encouraged to call [facility] for any additional concerns. The patient was referred to [facility] on December 13, 2015. Pathology results reported by Terie Purser, RN on 12/08/2015. Electronically Signed   By: Fidela Salisbury M.D.   On: 12/08/2015 14:33   Result Date: 12/10/2015 CLINICAL DATA:  Left breast 12:30 o'clock mass. EXAM: ULTRASOUND GUIDED LEFT BREAST CORE NEEDLE BIOPSY COMPARISON:  Previous exam(s). FINDINGS: I met with  the patient and we discussed the procedure of ultrasound-guided biopsy, including benefits and alternatives. We discussed the high likelihood of a successful procedure. We discussed the risks of the procedure, including infection, bleeding, tissue injury, clip migration, and inadequate sampling. Informed written consent was given. The usual time-out protocol was performed immediately prior to the procedure. Using sterile technique and 1% Lidocaine as local anesthetic, under direct ultrasound visualization, a 14 gauge spring-loaded device was used to perform biopsy of left breast 12:30 o'clock mass using a inferior approach. At the conclusion of the procedure a ribbon shaped tissue marker clip was deployed into the biopsy cavity. Follow up 2 view mammogram was performed and dictated separately. IMPRESSION: Ultrasound guided biopsy of left breast mass. No apparent complications. Electronically Signed: By: Fidela Salisbury M.D. On: 12/07/2015 08:58       IMPRESSION/PLAN: Clinical Stage IIA (T2 N0) Left Breast UOQ Invasive Ductal Carcinoma, ER- / PR- / Her2-, Grade 3. Triple negative  The patient is interested in breast conserving therapy and would appear to be a good candidate for this treatment approach followed by adjuvant radiation therapy. Patient will be scheduled to undergo genetic testing, MRI in light of the breast density, and surgery to follow.  It was a pleasure meeting the patient today. We discussed the risks, benefits, and side effects of radiotherapy. I recommend radiotherapy to the left breast to reduce her risk of locoregional recurrence by 2/3.  We discussed that radiation would take approximately 6.5 weeks to complete and that I would give the patient a few weeks to heal following surgery before starting treatment planning. If chemotherapy were to be given, this would precede radiotherapy. We spoke about acute effects including skin irritation and fatigue as well as much less common late effects including internal organ injury or irritation. We spoke about the latest technology that is used to minimize the risk of late effects for patients undergoing radiotherapy to the breast or chest wall. No guarantees of treatment were given. The patient is enthusiastic about proceeding with treatment. I look forward to participating in the patient's care.      __________________________________________  Blair Promise, PhD, MD  This document serves as a record of services personally performed by Gery Pray, MD. It was created on his behalf by Bethann Humble, a trained medical scribe. The creation of this record is based on the scribe's personal observations and the provider's statements to them. This document has been checked and approved by the attending provider.

## 2015-12-13 NOTE — Therapy (Signed)
[place], [place], [address] Phone: [phone]   Fax:  [phone]  Physical Therapy Evaluation  Patient Details  Name: [name] MRN: [id number] Date of Birth: 02/22/1950 Referring Provider: Dr. Alphonsa Overall  Encounter Date: 12/13/2015      PT End of Session - 12/13/15 1322    Visit Number 1   Number of Visits 1   PT Start Time 0910   PT Stop Time 0917  Also saw pt from 1000-1020 for a total of 27 minutes   PT Time Calculation (min) 7 min   Activity Tolerance Patient tolerated treatment well   Behavior During Therapy [facility] for tasks assessed/performed      Past Medical History:  Diagnosis Date  . HIV (human immunodeficiency virus infection) ([place])   . Hypertension   . Malignant neoplasm of upper-outer quadrant of left female breast ([place]) 12/11/2015  . Migraine   . Neuropathy ([place])   . PONV (postoperative nausea and vomiting)    said usually has to have scop patch  . Rash and nonspecific skin eruption 10/16/2015  . Spinal stenosis     Past Surgical History:  Procedure Laterality Date  . ABDOMINAL HYSTERECTOMY  1990  . CARPAL TUNNEL RELEASE     both hands  . CERVICAL FUSION  1989  . TONSILLECTOMY      There were no vitals filed for this visit.       Subjective Assessment - 12/13/15 1307    Subjective Patient reports she is here today to see her medical team for her newly diagnosed left breast cancer.   Patient is accompained by: Family member   Pertinent History Patient was diagnosed with left triple negative breast cancer on 12/05/15. It measures 2.1 cm and is located in the upper outer quadrant with a Ki67 of 35%.   Patient Stated Goals Reduce lymphedema risk and learn post op shoulder ROM HEP   Currently in Pain? No/denies  Only occurs after eating   Pain Score 8    Pain Location Chest   Pain Descriptors / Indicators Sharp   Pain Type Other (Comment)  unknown   Pain  Onset More than a month ago   Pain Frequency Intermittent   Aggravating Factors  Reports having sharp pain in her left breast/chest after eating   Pain Relieving Factors unknown   Multiple Pain Sites No            [facility] PT Assessment - 12/13/15 0001      Assessment   Medical Diagnosis Left breast cancer   Referring Provider Dr. Alphonsa Overall   Onset Date/Surgical Date 12/05/15   Hand Dominance Right   Prior Therapy none     Precautions   Precautions Other (comment)   Precaution Comments active cancer; HIV positive, right rotator cuff tear     Restrictions   Weight Bearing Restrictions No     Balance Screen   Has the patient fallen in the past 6 months No   Has the patient had a decrease in activity level because of a fear of falling?  No   Is the patient reluctant to leave their home because of a fear of falling?  No     Home Environment   Living Environment Private residence   Living Arrangements Spouse/significant other;Children  Husband and 64 y.o. son   Available Help at Discharge Family     Prior Function   Level of [place] Retired  Leisure She does not exercise     Cognition   Overall Cognitive Status Within Functional Limits for tasks assessed     Posture/Postural Control   Posture/Postural Control Postural limitations   Postural Limitations Rounded Shoulders;Forward head     ROM / Strength   AROM / PROM / Strength AROM;Strength     AROM   Overall AROM Comments Cervical ROM limited by previous cervical fusion   AROM Assessment Site Shoulder;Cervical   Right/Left Shoulder Right;Left   Right Shoulder Extension 45 Degrees  painful   Right Shoulder Flexion 140 Degrees  painful   Right Shoulder ABduction 138 Degrees   Right Shoulder Internal Rotation 80 Degrees   Right Shoulder External Rotation 80 Degrees   Left Shoulder Extension 50 Degrees   Left Shoulder Flexion 151 Degrees   Left Shoulder ABduction 156 Degrees    Left Shoulder Internal Rotation 71 Degrees   Left Shoulder External Rotation 79 Degrees   Cervical Flexion 25% limited   Cervical Extension 25% limited   Cervical - Right Side Bend 25% limited   Cervical - Left Side Bend 25% limited   Cervical - Right Rotation 25% limited   Cervical - Left Rotation 25% limited     Strength   Overall Strength Within functional limits for tasks performed           LYMPHEDEMA/ONCOLOGY QUESTIONNAIRE - 12/13/15 1318      Type   Cancer Type Left breast cancer     Lymphedema Assessments   Lymphedema Assessments Upper extremities     Right Upper Extremity Lymphedema   10 cm Proximal to Olecranon Process 20.5 cm   15 cm Proximal to Ulnar Styloid Process 19.2 cm   10 cm Proximal to Ulnar Styloid Process 14 cm   Just Proximal to Ulnar Styloid Process 18.5 cm   Across Hand at [employer] 6 cm     Left Upper Extremity Lymphedema   10 cm Proximal to Olecranon Process 24 cm   Olecranon Process 19.3 cm   10 cm Proximal to Ulnar Styloid Process 18.3 cm   Just Proximal to Ulnar Styloid Process 14 cm   Across Hand at [employer] 19.9 cm   At [place] of 2nd Digit 6 cm      Patient was instructed today in a home exercise program today for post op shoulder range of motion. These included active assist shoulder flexion in sitting, scapular retraction, wall walking with shoulder abduction, and hands behind head external rotation.  She was encouraged to do these twice a day, holding 3 seconds and repeating 5 times when permitted by her physician.         PT Education - 12/13/15 1321    Education provided Yes   Education Details Lymphedema risk reduction and post op shoulder ROM HEP   Person(s) Educated Patient;Spouse;Child(ren)   Methods Explanation;Demonstration;Handout   Comprehension Returned demonstration;Verbalized understanding              Breast Clinic Goals - 12/13/15 1329      Patient will be able to verbalize understanding of  pertinent lymphedema risk reduction practices relevant to her diagnosis specifically related to skin care.   Time 1   Period Days   Status Achieved     Patient will be able to return demonstrate and/or verbalize understanding of the post-op home exercise program related to regaining shoulder range of motion.   Time 1   Period Days   Status Achieved  Patient will be able to verbalize understanding of the importance of attending the postoperative After Breast Cancer Class for further lymphedema risk reduction education and therapeutic exercise.   Time 1   Period Days   Status Achieved              Plan - 12/13/15 1324    Clinical Impression Statement Patient was diagnosed with left triple negative breast cancer on 12/05/15. It measures 2.1 cm and is located in the upper outer quadrant with a Ki67 of 35%. Her multidisciplinary medical team met prior to her assessments to determine a recommended treatment plan.  She is planning to have a left lumpectomy and sentinel node biopsy followed by mammaprint testing and possible radiation.  She may benefit from post op PT to regain shoulder ROM and address her uninvolved shoulder as she has pain and limited ROM there already.  Due to her comorbidities of a torn rotator cuff on the opposite side which will need to compensate after breast surgery, previous cervical fusion, and other comorbidities listed in eval, her eval is of moderate complexity.   Rehab Potential Excellent   Clinical Impairments Affecting Rehab Potential See clinical impression   PT Frequency One time visit   PT Treatment/Interventions Patient/family education;Therapeutic exercise   PT Next Visit Plan Will f/u after surgery to determine PT needs   PT Home Exercise Plan Post op shoulder ROM HEP   Consulted and Agree with Plan of Care Patient;Family member/caregiver   Family Member Consulted Husband and son      Patient will benefit from skilled therapeutic intervention in  order to improve the following deficits and impairments:  Postural dysfunction, Pain, Decreased knowledge of precautions, Impaired UE functional use, Decreased range of motion  Visit Diagnosis: Carcinoma of upper-outer quadrant of left breast in female, estrogen receptor negative ([place]) - Plan: PT plan of care cert/re-cert  Abnormal posture - Plan: PT plan of care cert/re-cert  Chronic right shoulder pain - Plan: PT plan of care cert/re-cert   Patient will follow up at outpatient cancer rehab if needed following surgery.  If the patient requires physical therapy at that time, a specific plan will be dictated and sent to the referring physician for approval. The patient was educated today on appropriate basic range of motion exercises to begin post operatively and the importance of attending the After Breast Cancer class following surgery.  Patient was educated today on lymphedema risk reduction practices as it pertains to recommendations that will benefit the patient immediately following surgery.  She verbalized good understanding.  No additional physical therapy is indicated at this time.     Problem List Patient Active Problem List   Diagnosis Date Noted  . Malignant neoplasm of upper-outer quadrant of left female breast ([place]) 12/11/2015  . Rash and nonspecific skin eruption 10/16/2015  . Hereditary and idiopathic peripheral neuropathy 07/16/2012  . Lumbar neuritis 07/16/2012  . Cervicalgia 07/16/2012  . URI 12/02/2006  . HIV DISEASE 11/08/2005  . HYPERLIPIDEMIA 11/08/2005  . Essential hypertension 11/08/2005  . BRONCHITIS 11/08/2005  . CROHN'S DISEASE 11/08/2005  . SYMPTOM, PAINFUL RESPIRATION 11/08/2005   Annia Friendly, PT 12/13/15 1:32 PM  [place], [place], [address] Phone: [phone]   Fax:  [phone]  Name: IRAIDA CRAGIN MRN: [id number] Date of Birth: November 09, 1950

## 2015-12-13 NOTE — Progress Notes (Signed)
[place]  Telephone:([phone]) [phone] Fax:(336) [phone]     ID: ROZALYN OSLAND DOB: Oct 14, 1950  MR#: [id number]  IEP#:[id number]  Patient Care Team: Stephens Shire, MD as PCP - General (Family Medicine) Carlyle Basques, MD as PCP - Infectious Diseases (Infectious Diseases) Alphonsa Overall, MD as Consulting Physician (General Surgery) Chauncey Cruel, MD as Consulting Physician (Oncology) Gery Pray, MD as Consulting Physician (Radiation Oncology) Marcial Pacas, MD as Consulting Physician (Neurology) Druscilla Brownie, MD as Consulting Physician (Dermatology) Chauncey Cruel, MD OTHER MD:  CHIEF COMPLAINT: Triple negative breast cancer  CURRENT TREATMENT: Awaiting definitive surgery   BREAST CANCER HISTORY: Chaniah herself palpated a mass in the upper outer quadrant of her left breast the first week in November, and had bilateral diagnostic mammography with tomography and left breast ultrasonography at the [facility] 12/05/2015. The breast density was category C. In the upper outer left breast there was  A mass with macro lobulated contours which was barely palpable at the 12:30 o'clock position of the left breast 7 cm from the nipple. There was no palpable axillary adenopathy. Ultrasonography confirmed a 2.1 cm obliquely oriented hypoechoic mass in the area in question. Ultrasound of the left axilla was benign.  Biopsy of the left breast mass in question 12/07/2015 showed ([id number]) invasive ductal carcinoma, grade 3, estrogen and progesterone receptor negative, with an MIB-1 of 35%, and no HER-2 amplification, the signals ratio being 0.95 and the number per cell 2.80.  Her subsequent history is as detailed below  INTERVAL HISTORY: Katerra was evaluated in the breast cancer multidisciplinary clinic 12/13/2015 accompanied by her husband [name] and her son Whitecone.  Her case was also presented in the multidisciplinary breast cancer conference that same morning. At that  time a preliminary plan was proposed: Breast conserving surgery, with consideration of Mammaprint, followed by adjuvant radiation.  REVIEW OF SYSTEMS: Aside from the mass itself, there were no specific symptoms leading to the original mammogram, which was routinely scheduled. The patient denies unusual headaches, visual changes, nausea, vomiting, stiff neck, dizziness, or gait imbalance. There has been no cough, phlegm production, or pleurisy, no chest pain or pressure, and no change in bowel or bladder habits. The patient denies fever, rash, bleeding, unexplained fatigue or unexplained weight loss. She does report occasional "stabbing pains" in the left breast area, and also some joint pains here and there which are not more intense or persistent than usual. She has a history of migraines, which is not changed from baseline. A detailed review of systems was otherwise entirely negative.      PAST MEDICAL HISTORY: Past Medical History:  Diagnosis Date  . HIV (human immunodeficiency virus infection) ([place])   . Hypertension   . Malignant neoplasm of upper-outer quadrant of left female breast ([place]) 12/11/2015  . Migraine   . Neuropathy ([place])   . PONV (postoperative nausea and vomiting)    said usually has to have scop patch  . Rash and nonspecific skin eruption 10/16/2015  . Spinal stenosis     PAST SURGICAL HISTORY: Past Surgical History:  Procedure Laterality Date  . ABDOMINAL HYSTERECTOMY  1990  . CARPAL TUNNEL RELEASE     both hands  . CERVICAL FUSION  1989  . TONSILLECTOMY      FAMILY HISTORY Family History  Problem Relation Age of Onset  . High blood pressure Mother   . High Cholesterol Mother   . Cancer Father   . High blood pressure Father   . High  Cholesterol Father   . Lung cancer Maternal Grandfather   . Breast cancer Maternal Aunt 46  The patient's father died from heart disease at the age of 38. The patient's mother died from "genetic cirrhosis" at the age of 108. The  patient had 2 brothers, 5 sisters. A maternal aunt was diagnosed with breast cancer at age 30. There is no other history of breast or ovarian cancer in the family to the patient's knowledge   GYNECOLOGIC HISTORY:  No LMP recorded. Patient has had a hysterectomy. Menarche age 10, first live birth age 32. Patient is GX P1. She underwent hysterectomy with bilateral salpingo-oophorectomy at age 65. She took hormone replacement for approximately 10 years, until age 33. She also had oral contraceptives remotely for 8-10 years, with no complications.   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 At home is just she and her husband [name] who is retired from Teaching laboratory technician work. Their son [name] works as a Chief Strategy Officer for rest home in [place]. The patient has 3 grandchildren. She is not a Ambulance person.    ADVANCED DIRECTIVES: The patient has a living will in place   HEALTH MAINTENANCE: Social History  Substance Use Topics  . Smoking status: Never Smoker  . Smokeless tobacco: Never Used  . Alcohol use 0.6 oz/week    1 Glasses of wine per week     Comment: on weekends     Colonoscopy: 2012/be routinely  PAP: Status post hysterectomy  Bone density: Never   No Known Allergies  Current Outpatient Prescriptions  Medication Sig Dispense Refill  . amLODipine (NORVASC) 5 MG tablet Take 5 mg by mouth daily.    . benazepril (LOTENSIN) 40 MG tablet Take 40 mg by mouth daily.      . Coenzyme Q10 300 MG CAPS Take 300 capsules by mouth daily.    . cyclobenzaprine (FLEXERIL) 10 MG tablet Take 1 tablet (10 mg total) by mouth at bedtime. 90 tablet 3  . elvitegravir-cobicistat-emtricitabine-tenofovir (GENVOYA) 150-150-200-10 MG TABS tablet Take 1 tablet by mouth daily with breakfast. 90 tablet 4  . flunisolide (NASALIDE) 25 MCG/ACT (0.025%) SOLN Place 2 sprays into the nose 2 (two) times daily. 1 Bottle 1  . gabapentin (NEURONTIN) 100 MG capsule Take 1 capsule (100 mg total)  by mouth 2 (two) times daily. 180 capsule 3  . naproxen sodium (ANAPROX) 220 MG tablet Take 220 mg by mouth daily.     . Omega-3 Fatty Acids (FISH OIL) 1200 MG CAPS Take 1,200 mg by mouth 2 (two) times daily.      . rosuvastatin (CRESTOR) 5 MG tablet Take 1 tablet (5 mg total) by mouth at bedtime. 90 tablet 3  . triamcinolone ointment (KENALOG) 0.5 % Apply 1 application topically 2 (two) times daily. 60 g 2   No current facility-administered medications for this visit.     OBJECTIVE: Middle-aged white woman who appears stated age  89:   12/13/15 0843  BP: 138/69  Pulse: 95  Resp: 18  Temp: 97.7 F (36.5 C)     Body mass index is 24.73 kg/m.    ECOG FS:0 - Asymptomatic  Ocular: Sclerae unicteric, pupils equal, round and reactive to light Ear-nose-throat: Oropharynx clear and moist Lymphatic: No cervical or supraclavicular adenopathy Lungs no rales or rhonchi, good excursion bilaterally Heart regular rate and rhythm, no murmur appreciated Abd soft, nontender, positive bowel sounds MSK no focal spinal tenderness, no joint edema Neuro: non-focal, well-oriented, appropriate affect  Breasts: The right breast is unremarkable. The left breast is status post recent biopsy and there is a moderate ecchymosis. In the upper outer quadrant there is an easily palpable mass measuring approximately 1-1/2 cm, which slightly distorts the breast contour. This is easily movable. There is no associated erythema or tenderness. There are no other findings of concern. The left axilla is benign   LAB RESULTS:  CMP     Component Value Date/Time   NA 142 12/13/2015 0815   K 4.0 12/13/2015 0815   CL 100 10/16/2015 1103   CO2 27 12/13/2015 0815   GLUCOSE 65 (L) 12/13/2015 0815   BUN 14.0 12/13/2015 0815   CREATININE 0.9 12/13/2015 0815   CALCIUM 10.4 12/13/2015 0815   PROT 7.9 12/13/2015 0815   ALBUMIN 4.1 12/13/2015 0815   AST 23 12/13/2015 0815   ALT 17 12/13/2015 0815   ALKPHOS 70 12/13/2015  0815   BILITOT 0.46 12/13/2015 0815   GFRNONAA 65 10/16/2015 1103   GFRAA 75 10/16/2015 1103    INo results found for: SPEP, UPEP  Lab Results  Component Value Date   WBC 7.4 12/13/2015   NEUTROABS 4.2 12/13/2015   HGB 14.7 12/13/2015   HCT 42.2 12/13/2015   MCV 95.5 12/13/2015   PLT 286 12/13/2015      Chemistry      Component Value Date/Time   NA 142 12/13/2015 0815   K 4.0 12/13/2015 0815   CL 100 10/16/2015 1103   CO2 27 12/13/2015 0815   BUN 14.0 12/13/2015 0815   CREATININE 0.9 12/13/2015 0815      Component Value Date/Time   CALCIUM 10.4 12/13/2015 0815   ALKPHOS 70 12/13/2015 0815   AST 23 12/13/2015 0815   ALT 17 12/13/2015 0815   BILITOT 0.46 12/13/2015 0815       No results found for: LABCA2  No components found for: LABCA125  No results for input(s): INR in the last 168 hours.  Urinalysis    Component Value Date/Time   COLORURINE YELLOW 03/21/2007 1108   APPEARANCEUR CLEAR 03/21/2007 1108   LABSPEC 1.021 03/21/2007 1108   PHURINE 7.0 03/21/2007 1108   GLUCOSEU 100 (A) 03/21/2007 1108   HGBUR NEGATIVE 03/21/2007 1108   BILIRUBINUR NEGATIVE 03/21/2007 1108   KETONESUR NEGATIVE 03/21/2007 1108   PROTEINUR NEGATIVE 03/21/2007 1108   UROBILINOGEN 1.0 03/21/2007 1108   NITRITE NEGATIVE 03/21/2007 1108   LEUKOCYTESUR SMALL (A) 03/21/2007 1108     STUDIES: US Breast Ltd Uni Left Inc Axilla  Result Date: 12/05/2015 CLINICAL DATA:  Mass felt by the patient in the upper outer left breast for the past 4 days. EXAM: 2D DIGITAL DIAGNOSTIC BILATERAL MAMMOGRAM WITH CAD AND ADJUNCT TOMO ULTRASOUND LEFT BREAST COMPARISON:  Previous exam(s). ACR Breast Density Category c: The breast tissue is heterogeneously dense, which may obscure small masses. FINDINGS: 2D and 3D tomographic images of the breasts were obtained. These demonstrate an oval, circumscribed mass with macrolobulated contours in the upper outer left breast, anteriorly. This corresponds to the  palpable mass, marked with a metallic marker. There are no findings elsewhere in either breast suspicious for malignancy. Mammographic images were processed with CAD. On physical exam, the patient has an approximately 1 cm faintly palpable mass in the 12:30 o'clock position of the left breast, 7 cm from the nipple. There are no palpable left axillary lymph nodes. Targeted ultrasound is performed, showing a 2.1 x 1.6 x 1.1 cm oval, obliquely oriented, hypoechoic mass with macro lobulated margins  in the 12:30 o'clock position of the left breast, 7 cm from the nipple. This has poorly defined margins and ill-defined surrounding increased echogenicity. Ultrasound of the left axilla demonstrated normal appearing left axillary lymph nodes. IMPRESSION: 2.1 cm mass in the 12:30 o'clock position of the left breast with imaging features suspicious for malignancy. RECOMMENDATION: Ultrasound-guided core needle biopsy of the 2.1 cm mass in the 12:30 o'clock position of the left breast. I have discussed the findings and recommendations with the patient. Results were also provided in writing at the conclusion of the visit. If applicable, a reminder letter will be sent to the patient regarding the next appointment. BI-RADS CATEGORY  4: Suspicious. Electronically Signed   By: Claudie Revering M.D.   On: 12/05/2015 12:36   Mm Diag Breast Tomo Bilateral  Result Date: 12/05/2015 CLINICAL DATA:  Mass felt by the patient in the upper outer left breast for the past 4 days. EXAM: 2D DIGITAL DIAGNOSTIC BILATERAL MAMMOGRAM WITH CAD AND ADJUNCT TOMO ULTRASOUND LEFT BREAST COMPARISON:  Previous exam(s). ACR Breast Density Category c: The breast tissue is heterogeneously dense, which may obscure small masses. FINDINGS: 2D and 3D tomographic images of the breasts were obtained. These demonstrate an oval, circumscribed mass with macrolobulated contours in the upper outer left breast, anteriorly. This corresponds to the palpable mass, marked with a  metallic marker. There are no findings elsewhere in either breast suspicious for malignancy. Mammographic images were processed with CAD. On physical exam, the patient has an approximately 1 cm faintly palpable mass in the 12:30 o'clock position of the left breast, 7 cm from the nipple. There are no palpable left axillary lymph nodes. Targeted ultrasound is performed, showing a 2.1 x 1.6 x 1.1 cm oval, obliquely oriented, hypoechoic mass with macro lobulated margins in the 12:30 o'clock position of the left breast, 7 cm from the nipple. This has poorly defined margins and ill-defined surrounding increased echogenicity. Ultrasound of the left axilla demonstrated normal appearing left axillary lymph nodes. IMPRESSION: 2.1 cm mass in the 12:30 o'clock position of the left breast with imaging features suspicious for malignancy. RECOMMENDATION: Ultrasound-guided core needle biopsy of the 2.1 cm mass in the 12:30 o'clock position of the left breast. I have discussed the findings and recommendations with the patient. Results were also provided in writing at the conclusion of the visit. If applicable, a reminder letter will be sent to the patient regarding the next appointment. BI-RADS CATEGORY  4: Suspicious. Electronically Signed   By: Claudie Revering M.D.   On: 12/05/2015 12:36   Mm Clip Placement Left  Result Date: 12/07/2015 CLINICAL DATA:  Post ultrasound-guided core needle biopsy of left 12:30 o'clock breast mass. EXAM: DIAGNOSTIC LEFT MAMMOGRAM POST ULTRASOUND BIOPSY COMPARISON:  Previous exam(s). FINDINGS: Mammographic images were obtained following ultrasound guided biopsy of left breast mass. Two-view mammography demonstrates presence of ribbon shaped marker within the biopsied mass. No apparent complications. IMPRESSION: Successful placement of ribbon shaped marker within left breast 12:30 o'clock breast mass, post ultrasound-guided core needle biopsy. Final Assessment: Post Procedure Mammograms for Marker  Placement Electronically Signed   By: Fidela Salisbury M.D.   On: 12/07/2015 08:59   [name] Lt Breast Bx W Loc Dev 1st Lesion Img Bx Spec US Guide  Addendum Date: 12/10/2015   ADDENDUM REPORT: 12/08/2015 14:33 ADDENDUM: Pathology revealed GRADE III INVASIVE DUCTAL CARCINOMA, DUCTAL CARCINOMA IN SITU of the Left breast at the 12:30 o'clock location. This was found to be concordant by Dr. Fidela Salisbury. Pathology results  were discussed with the patient by telephone. The patient reported doing well after the biopsy with tenderness at the site. Post biopsy instructions and care were reviewed and questions were answered. The patient was encouraged to call [facility] for any additional concerns. The patient was referred to [facility] on December 13, 2015. Pathology results reported by Terie Purser, RN on 12/08/2015. Electronically Signed   By: Fidela Salisbury M.D.   On: 12/08/2015 14:33   Result Date: 12/10/2015 CLINICAL DATA:  Left breast 12:30 o'clock mass. EXAM: ULTRASOUND GUIDED LEFT BREAST CORE NEEDLE BIOPSY COMPARISON:  Previous exam(s). FINDINGS: I met with the patient and we discussed the procedure of ultrasound-guided biopsy, including benefits and alternatives. We discussed the high likelihood of a successful procedure. We discussed the risks of the procedure, including infection, bleeding, tissue injury, clip migration, and inadequate sampling. Informed written consent was given. The usual time-out protocol was performed immediately prior to the procedure. Using sterile technique and 1% Lidocaine as local anesthetic, under direct ultrasound visualization, a 14 gauge spring-loaded device was used to perform biopsy of left breast 12:30 o'clock mass using a inferior approach. At the conclusion of the procedure a ribbon shaped tissue marker clip was deployed into the biopsy cavity. Follow up 2  view mammogram was performed and dictated separately. IMPRESSION: Ultrasound guided biopsy of left breast mass. No apparent complications. Electronically Signed: By: Fidela Salisbury M.D. On: 12/07/2015 08:58    ELIGIBLE FOR AVAILABLE RESEARCH PROTOCOL: [facility] [phone]  ASSESSMENT: 65 y.o. [place] woman status post left breast upper outer quadrant biopsy 12/07/2015 for a clinical T2 N0, stage IIA invasive ductal carcinoma, grade 3, triple negative, with an MIB-1 of 35%  (1) genetics testing 01/23/2016  (a) alpha - anti trypsin and ceruloplamin labs to be drawn 01/05/2016  (2) definitive surgery pending  (3) Mammaprint study to be sent from the patient's 12/07/2015 biopsy  (4) adjuvant radiation to follow  PLAN: We spent the better part of today's hour-long appointment discussing the biology of breast cancer in general, and the specifics of the patient's tumor in particular. We first reviewed the fact that cancer is not one disease but more than 100 different diseases and that it is important to keep them separate-- otherwise when friends and relatives discuss their own cancer experiences with Jaydy confusion can result. Similarly we explained that if breast cancer spreads to the bone or liver, the patient would not have bone cancer or liver cancer, but breast cancer in the bone and breast cancer in the liver: one cancer in three places-- not 3 different cancers which otherwise would have to be treated in 3 different ways.  We discussed the difference between local and systemic therapy. In terms of loco-regional treatment, lumpectomy plus radiation is equivalent to mastectomy as far as survival is concerned. For this reason, and because the cosmetic results are generally superior, we recommend breast conserving surgery. Mannat is comfortable with that decision  We then discussed the rationale for systemic therapy. There is some risk that this cancer may have already spread to other parts of  her body. Patients frequently ask at this point about bone scans, CAT scans and PET scans to find out if they have occult breast cancer somewhere else. The problem is that in early stage disease we are much more likely to find false positives then true cancers and this would expose the patient to unnecessary procedures as  well as unnecessary radiation. Scans cannot answer the question the patient really would like to know, which is whether she has microscopic disease elsewhere in her body. For those reasons we do not recommend them.  Of course we would proceed to aggressive evaluation of any symptoms that might suggest metastatic disease, but that is not the case here.  Next we went over the options for systemic therapy which are anti-estrogens, anti-HER-2 immunotherapy, and chemotherapy. Julienne does not meet criteria for anti-estrogen or anti-HER-2 immunotherapy. In patients with triple-negative tumors like her, the only choice for systemic disease is chemotherapy.  Until recently that would have been the end of the story, but now that we have a Mammaprint's test we know that some patients with triple-negative disease will get very little benefit on chemotherapy. Accordingly we are requesting a Mammaprint test from her original biopsy obtained 12/07/2015. She will return to see me 01/05/2016 to discuss those results.  Amos understands my expectation is her Mammaprint score will be "high risk", and therefore she will likely need chemotherapy. If so she will receive doxorubicin and cyclophosphamide in dose dense fashion 4, followed by weekly paclitaxel 4  Given her mother's history of "genetic cirrhosis", I'm going to check her alpha-1 antitrypsin and ceruloplasmin labs when she returns to see me.  Babbie also qualifies for genetics testing. She understands even if she carries a deleterious mutation she is not obligated to proceed to bilateral mastectomies. An equally safe option would be intensified  screening with yearly MRI as well as yearly mammography. At this point that appears to be her preference  Emrie has a good understanding of the overall plan. She agrees with it. She knows the goal of treatment in her case is cure. She will call with any problems that may develop before her next visit here.  Chauncey Cruel, MD   12/15/2015 9:44 AM Medical Oncology and Hematology [facility] [address] [place], [place] [address] Tel. [phone]    Fax. [phone]

## 2015-12-16 ENCOUNTER — Ambulatory Visit (HOSPITAL_COMMUNITY)
Admission: RE | Admit: 2015-12-16 | Discharge: 2015-12-16 | Disposition: A | Discharge status: Home / Self Care | Payer: BLUE CROSS/BLUE SHIELD | Source: Ambulatory Visit | Attending: Surgery | Admitting: Surgery

## 2015-12-16 DIAGNOSIS — C50412 Malignant neoplasm of upper-outer quadrant of left female breast: Secondary | ICD-10-CM | POA: Diagnosis present

## 2015-12-16 DIAGNOSIS — Z171 Estrogen receptor negative status [ER-]: Secondary | ICD-10-CM | POA: Insufficient documentation

## 2015-12-16 MED ORDER — GADOBENATE DIMEGLUMINE 529 MG/ML IV SOLN
13.0000 mL | Freq: Once | INTRAVENOUS | Status: AC | PRN
  Administered 2015-12-16: 13 mL via INTRAVENOUS

## 2015-12-18 ENCOUNTER — Ambulatory Visit: Payer: BLUE CROSS/BLUE SHIELD

## 2015-12-18 ENCOUNTER — Telehealth: Payer: Self-pay | Admitting: *Deleted

## 2015-12-18 NOTE — Telephone Encounter (Signed)
  Oncology Nurse Navigator Documentation  Navigator Location: [facility] (12/18/15 1000)   )Navigator Encounter Type: Telephone (12/18/15 1000) Telephone: [facility] Call;Clinic/[facility] Follow-up (12/18/15 1000)  Denies questions or concerns regarding dx or treatment care plan. Encourage pt to call with needs. Received verbal understanding. Contact information provided.     Genetic Counseling Date: 01/23/16 (12/18/15 1000) Genetic Counseling Type: Non-Urgent (12/18/15 1000)                                        Time Spent with Patient: 15 (12/18/15 1000)

## 2015-12-19 ENCOUNTER — Encounter (HOSPITAL_COMMUNITY): Payer: Self-pay

## 2015-12-19 ENCOUNTER — Other Ambulatory Visit: Payer: Self-pay | Admitting: Surgery

## 2015-12-19 ENCOUNTER — Telehealth: Payer: Self-pay | Admitting: *Deleted

## 2015-12-19 DIAGNOSIS — Z171 Estrogen receptor negative status [ER-]: Principal | ICD-10-CM

## 2015-12-19 DIAGNOSIS — C50912 Malignant neoplasm of unspecified site of left female breast: Secondary | ICD-10-CM

## 2015-12-19 NOTE — Telephone Encounter (Signed)
Received Mammaprint testing results of HIGH RISK.  Physician team notified.

## 2015-12-20 ENCOUNTER — Encounter (HOSPITAL_BASED_OUTPATIENT_CLINIC_OR_DEPARTMENT_OTHER)
Admission: RE | Admit: 2015-12-20 | Discharge: 2015-12-20 | Disposition: A | Discharge status: Home / Self Care | Payer: BLUE CROSS/BLUE SHIELD | Source: Ambulatory Visit | Attending: Surgery | Admitting: Surgery

## 2015-12-20 ENCOUNTER — Encounter (HOSPITAL_BASED_OUTPATIENT_CLINIC_OR_DEPARTMENT_OTHER): Payer: Self-pay | Admitting: *Deleted

## 2015-12-20 ENCOUNTER — Other Ambulatory Visit: Payer: Self-pay

## 2015-12-20 ENCOUNTER — Other Ambulatory Visit: Payer: Self-pay | Admitting: Surgery

## 2015-12-20 DIAGNOSIS — C50912 Malignant neoplasm of unspecified site of left female breast: Secondary | ICD-10-CM

## 2015-12-20 DIAGNOSIS — C50412 Malignant neoplasm of upper-outer quadrant of left female breast: Secondary | ICD-10-CM | POA: Diagnosis not present

## 2015-12-20 DIAGNOSIS — Z171 Estrogen receptor negative status [ER-]: Secondary | ICD-10-CM | POA: Diagnosis not present

## 2015-12-20 NOTE — Pre-Procedure Instructions (Signed)
Gave patient boost breeze and instructed to finish by 4am DOS. She voiced understanding

## 2015-12-22 ENCOUNTER — Telehealth: Payer: Self-pay | Admitting: Oncology

## 2015-12-22 NOTE — Telephone Encounter (Signed)
sw pt to confirm r/s lab/office visit date/time per LOS

## 2015-12-24 ENCOUNTER — Other Ambulatory Visit: Payer: Self-pay | Admitting: Oncology

## 2015-12-28 ENCOUNTER — Ambulatory Visit
Admission: RE | Admit: 2015-12-28 | Discharge: 2015-12-28 | Disposition: A | Discharge status: Home / Self Care | Payer: BLUE CROSS/BLUE SHIELD | Source: Ambulatory Visit | Attending: Surgery | Admitting: Surgery

## 2015-12-28 ENCOUNTER — Ambulatory Visit: Payer: BLUE CROSS/BLUE SHIELD | Admitting: Pharmacist

## 2015-12-28 ENCOUNTER — Telehealth: Payer: Self-pay | Admitting: *Deleted

## 2015-12-28 DIAGNOSIS — C50912 Malignant neoplasm of unspecified site of left female breast: Secondary | ICD-10-CM

## 2015-12-28 DIAGNOSIS — B20 Human immunodeficiency virus [HIV] disease: Secondary | ICD-10-CM

## 2015-12-28 DIAGNOSIS — Z171 Estrogen receptor negative status [ER-]: Principal | ICD-10-CM

## 2015-12-28 MED ORDER — DOLUTEGRAVIR SODIUM 50 MG PO TABS: 50.0000 mg | ORAL_TABLET | Freq: Every day | ORAL | 5 refills | Status: DC

## 2015-12-28 MED ORDER — EMTRICITABINE-TENOFOVIR AF 200-25 MG PO TABS: 1.0000 | ORAL_TABLET | Freq: Every day | ORAL | 5 refills | Status: DC

## 2015-12-28 NOTE — Patient Instructions (Addendum)
Stop taking your Genvoya after surgery on Monday 12/4 and start taking tivicay and descovy daily on Tuesday 12/5. Please call the clinic if you start any new medications and we are happy to check for any drug interactions. Our phone number is [phone] [facility] pharmacy office).  The chemo medications we checked for interactions include: doxorubicin, cyclophosphamide and paclitaxel

## 2015-12-28 NOTE — Progress Notes (Signed)
HPI: [name] is a 65 y.o. female who presents today for HIV follow up.  Allergies: No Known Allergies  Vitals:    Past Medical History: Past Medical History:  Diagnosis Date  . HIV (human immunodeficiency virus infection) ([place])   . Hypertension   . Malignant neoplasm of upper-outer quadrant of left female breast ([place]) 12/11/2015  . Migraine   . Neuropathy ([place])   . PONV (postoperative nausea and vomiting)    said usually has to have scop patch  . Rash and nonspecific skin eruption 10/16/2015  . Spinal stenosis     Social History: Social History   Social History  . Marital status: Married    Spouse name: [name]  . Number of children: 1  . Years of education: 68   Occupational History  . Retired    Social History Main Topics  . Smoking status: Never Smoker  . Smokeless tobacco: Never Used  . Alcohol use 0.6 oz/week    1 Glasses of wine per week     Comment: on weekends  . Drug use: No  . Sexual activity: Yes     Comment: declined condoms   Other Topics Concern  . Not on file   Social History Narrative   Patient lives at home with her husband [name]). 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   Caffeine - None    Right handed.      Current Regimen: Genvoya daily  Labs: HIV 1 RNA Quant (copies/mL)  Date Value  10/16/2015 <20  05/01/2015 <20  10/25/2014 <20   CD4 T Cell Abs (/uL)  Date Value  10/16/2015 890  05/01/2015 880  10/25/2014 980   Hep B S Ab (no units)  Date Value  03/24/2006 No   Hepatitis B Surface Ag (no units)  Date Value  03/24/2006 No   HCV Ab (no units)  Date Value  03/24/2006 No    CrCl: Estimated Creatinine Clearance: 56.8 mL/min (by C-G formula based on SCr of 0.9 mg/dL).  Lipids:    Component Value Date/Time   CHOL 199 11/09/2015 1005   TRIG 135 11/09/2015 1005   HDL 73 11/09/2015 1005   CHOLHDL 2.7 11/09/2015 1005   VLDL 27 11/09/2015 1005   LDLCALC 99 11/09/2015 1005     Assessment: [name] is a 65 year old female who presents today for HIV follow up. She was recently diagnosed with breast cancer earlier this month. She called the clinic to ask about drug interactions with her potential chemotherapy regimen (doxorubicin, cyclophosphamide and paclitaxel). [name] has been very adherent to her Genvoya and remains undetectable. She is planning to meet with her oncologist on 12/22 to discuss starting chemotherapy.  Due to potential interactions with Genvoya we will change her ART regimen to Tivicay and Descovy daily. I explained this change to the patient and she is agreeable. She states that she has surgery scheduled for Monday and would like to continue her Genvoya through 12/4. We will start her on Tivicay/Descovy on 12/5. Her new prescriptions were sent to [facility] in [place].  Recommendations: - Last dose of Genvoya on 12/4 - First dose of Tivicay/Descovy on 12/5 - Lab visit scheduled for 1/4  [name], PharmD, BCPS PGY-2 Infectious Diseases Pharmacy Resident Pager: [phone] 12/28/2015, 11:04 AM

## 2015-12-28 NOTE — Telephone Encounter (Signed)
Received a call from HIV clinic (pharmacy area) in the office of MD [facility]. Pharmacist wanted to let MD Magrinat know that patient is switching from genvoya to Tivicay-descovy (2 tablets daily). No interactions per pharmacist.

## 2015-12-31 NOTE — H&P (Signed)
[name]  Location: [facility] Patient #: [id number] DOB: [birth year] Undefined / Language: [name] / Race: White Female  History of Present Illness  The patient is a 65 year old female who presents with a complaint of Breast cancer at the [facility] clinic.  The PCP is Dr. Retia Passe. The patient was referred by Dr. Retia Passe. She is at the [facility] clinic - Oncology Drs. Magrinat and Kinard. She comes with her husband and son, [name].  She is a patient of [facility]. she was coming up on her annual mammogram which felt pain and a mass in her left breast at the 12 o'clock position. So she moved her mammogram up a couple of weeks. She has an aunt on her mother's side who had breast cancer. She took Premarin up to 10 years ago when she stopped. She has had no prior breast issues. She had a biopsy of the left breast at 12:30 on 12/07/2015. The biopsy ([id number]) showed IDC, grade 3, 2.1 cm on [name], triple negative, Ki67 - 35%.  I discussed the options for breast cancer treatment with the patient. She is at the Breast multidisciplinary conference, which includes medical oncology and radiation oncology. I discussed the surgical options of lumpectomy vs. mastectomy. If mastectomy, there is the possibility of reconstruction. I discussed the options of lymph node biopsy. The treatment plan depends on the pathologic staging of the tumor and the patient's personal wishes. The risks of surgery include, but are not limited to, bleeding, infection, the need for further surgery, and nerve injury. The patient has been given literature on the treatment of breast cancer.  Plan; 1) MRI of breast for dense breast (planned for 11/18), 2) Mammoprint on left breast biopsy, 3) left breast lumpectomy (seed) and left axillary node dissection, 4) possbile power port  I discussed the indications and potential complications of the power port placement.  The primary complications of the power port, include, but are not limited to, bleeding, infection, nerve injury, thrombosis, and pneumothorax.  Past Medical History: 1. HIV from first husband. Followed by Dr. Baxter Flattery Controlled well on her med Genvoya. 2. There is Crohn's disease mentioned in her chart, but this is an error It is thought that her bowel disease was from HIV - she is having no chronic issues 3. Last colonoscopy 2009 - [facility] 4. Low back hurts, but does not see any one 5. Right rotator cuff problems, but does not see anyone. 6. 1990 - Neck surgery by Dr. Sherwood Gambler 7. History of motion sickness/vertigo 8. Hysterectomy at age 47 9. Saw Dr. Marcial Pacas - neurologist - 10. History of migraine headaches  Social History: She comes with her husband (he's had a stroke or has Parkinson's) and son, [name]. This husband has parkinson's disease, slow gait.   Other Problems Tawni Pummel, RN; 12/13/2015 7:54 AM) Anxiety Disorder Arthritis Back Pain Breast Cancer Heart murmur Hemorrhoids High blood pressure HIV-positive Hypercholesterolemia Lump In Breast Melanoma Migraine Headache Oophorectomy Bilateral. Other disease, cancer, significant illness  Past Surgical History Tawni Pummel, RN; 12/13/2015 7:54 AM) Breast Biopsy Left. Hip Surgery Left. Hysterectomy (not due to cancer) - Complete Oral Surgery Spinal Surgery - Neck Tonsillectomy  Diagnostic Studies History Tawni Pummel, RN; 12/13/2015 7:54 AM) Colonoscopy 5-10 years ago Mammogram within last year Pap Smear >5 years ago  Medication History Tawni Pummel, RN; 12/13/2015 7:54 AM) Medications Reconciled  Social History Tawni Pummel, RN; 12/13/2015 7:54 AM) Alcohol use Occasional alcohol use. Caffeine use Coffee. No drug  use Tobacco use Never smoker.  Family History Tawni Pummel, RN; 12/13/2015 7:54 AM) Arthritis Father,  Mother. Diabetes Mellitus Father. Hypertension Father, Mother, Sister. Respiratory Condition Family Members In General.  Pregnancy / Birth History Tawni Pummel, RN; 12/13/2015 7:54 AM) Age at menarche 89 years. Age of menopause <46 Contraceptive History Oral contraceptives. Gravida 1 Irregular periods Maternal age 27-30 Para 1    Review of Systems Tawni Pummel RN; 12/13/2015 7:54 AM) General Not Present- Appetite Loss, Chills, Fatigue, Fever, Night Sweats, Weight Gain and Weight Loss. Skin Not Present- Change in Wart/Mole, Dryness, Hives, Jaundice, New Lesions, Non-Healing Wounds, Rash and Ulcer. HEENT Present- Seasonal Allergies, Sinus Pain and Wears glasses/contact lenses. Not Present- Earache, Hearing Loss, Hoarseness, Nose Bleed, Oral Ulcers, Ringing in the Ears, Sore Throat, Visual Disturbances and Yellow Eyes. Respiratory Present- Snoring. Not Present- Bloody sputum, Chronic Cough, Difficulty Breathing and Wheezing. Cardiovascular Not Present- Chest Pain, Difficulty Breathing Lying Down, Leg Cramps, Palpitations, Rapid Heart Rate, Shortness of Breath and Swelling of Extremities. Gastrointestinal Present- Hemorrhoids. Not Present- Abdominal Pain, Bloating, Bloody Stool, Change in Bowel Habits, Chronic diarrhea, Constipation, Difficulty Swallowing, Excessive gas, Gets full quickly at meals, Indigestion, Nausea, Rectal Pain and Vomiting. Female Genitourinary Not Present- Frequency, Nocturia, Painful Urination, Pelvic Pain and Urgency. Musculoskeletal Present- Back Pain and Joint Stiffness. Not Present- Joint Pain, Muscle Pain, Muscle Weakness and Swelling of Extremities. Neurological Present- Headaches. Not Present- Decreased Memory, Fainting, Numbness, Seizures, Tingling, Tremor, Trouble walking and Weakness. Psychiatric Present- Anxiety and Change in Sleep Pattern. Not Present- Bipolar, Depression, Fearful and Frequent crying. Endocrine Present- Hair Changes. Not  Present- Cold Intolerance, Excessive Hunger, Heat Intolerance, Hot flashes and New Diabetes. Hematology Present- HIV. Not Present- Blood Thinners, Easy Bruising, Excessive bleeding, Gland problems and Persistent Infections.   Physical Exam  General: Older [place] and generally healthy appearing. Skin: Inspection and palpation of the skin unremarkable.  Eyes: Conjunctivae white, pupils equal. Face, ears, nose, mouth, and throat: Face - normal. Normal ears and nose. Lips and teeth normal.  Neck: Supple. No mass. Trachea midline. Left neck scar from prior surgery. Lymph Nodes: No supraclavicular or cervical adenopathy. No axillary adenopathy.  Lungs: Normal respiratory effort. Clear to auscultation and symmetric breath sounds. Cardiovascular: Regular rate and rythm. Normal auscultation of the heart. No murmur or rub. Normal carotid pulse.  Breasts: Right - no mass. large breast Left - Mass at 12:30 o'clock, about 2 cm. Not sure if this is hematoma vs tumor. She has large breast, so plenty of room to do lumpectomy.  Abdomen: Soft. No mass. Liver and spleen not palpable. No tenderness. No hernia. Normal bowel sounds. Pfannenstiel incision.  Musculoskeletal/extremities: Normal gait. Good strength and ROM in upper and lower extremities.   Neurologic: Grossly intact to motor and sensory function.   Psychiatric: Has normal mood and affect. Judgement and insight appear normal.  Assessment & Plan  1.  BREAST CANCER, STAGE 2, LEFT (C50.912)  Story: She had a biopsy of the left breast at 12:30 on 12/07/2015 at [facility]. The biopsy ([id number]) showed IDC, grade 3, 2.1 cm on [name], triple negative.  Her treating oncologist are Drs. Magrinat and Kinard.  Plan;   1)  Addendum Note(Bettylou Frew H. Lashanta Elbe MD; 12/18/2015 2:01 PM) Her breast MRI on 11/18 showed one area that 1.7 cm in the left breast as abnormal, consistent with the known cancer.   2)   Mammoprint is "high risk" - she will need adjuvant chemotx   3) Left breast lumpectomy (seed) and  left axillary node dissection,  Left breast seed placed at [facility] on 12/28/2015   4) Possbile power port With high risk mammoprint - will place port - discussed with patient  2.  HIV (HUMAN IMMUNODEFICIENCY VIRUS INFECTION) ([place])  Impression: Followed by Dr. Baxter Flattery.  On Genvoya.  3. Low back hurts, but does not see any one 4. Right rotator cuff problems, but does not see anyone.   Alphonsa Overall, MD, [facility] Surgery Pager: [phone] Office phone:  [phone]

## 2016-01-01 ENCOUNTER — Ambulatory Visit (HOSPITAL_BASED_OUTPATIENT_CLINIC_OR_DEPARTMENT_OTHER): Payer: BLUE CROSS/BLUE SHIELD | Admitting: Anesthesiology

## 2016-01-01 ENCOUNTER — Encounter (HOSPITAL_BASED_OUTPATIENT_CLINIC_OR_DEPARTMENT_OTHER): Payer: Self-pay

## 2016-01-01 ENCOUNTER — Ambulatory Visit (HOSPITAL_BASED_OUTPATIENT_CLINIC_OR_DEPARTMENT_OTHER)
Admission: RE | Admit: 2016-01-01 | Discharge: 2016-01-01 | Disposition: A | Discharge status: Home / Self Care | Payer: BLUE CROSS/BLUE SHIELD | Source: Ambulatory Visit | Attending: Surgery | Admitting: Surgery

## 2016-01-01 ENCOUNTER — Ambulatory Visit (HOSPITAL_COMMUNITY): Payer: BLUE CROSS/BLUE SHIELD

## 2016-01-01 ENCOUNTER — Encounter (HOSPITAL_BASED_OUTPATIENT_CLINIC_OR_DEPARTMENT_OTHER)
Admission: RE | Disposition: A | Discharge status: Home / Self Care | Payer: Self-pay | Source: Ambulatory Visit | Attending: Surgery

## 2016-01-01 ENCOUNTER — Ambulatory Visit (HOSPITAL_COMMUNITY)
Admission: RE | Admit: 2016-01-01 | Discharge: 2016-01-01 | Disposition: A | Discharge status: Home / Self Care | Payer: BLUE CROSS/BLUE SHIELD | Source: Ambulatory Visit | Attending: Surgery | Admitting: Surgery

## 2016-01-01 ENCOUNTER — Ambulatory Visit
Admission: RE | Admit: 2016-01-01 | Discharge: 2016-01-01 | Disposition: A | Discharge status: Home / Self Care | Payer: BLUE CROSS/BLUE SHIELD | Source: Ambulatory Visit | Attending: Surgery | Admitting: Surgery

## 2016-01-01 DIAGNOSIS — M1990 Unspecified osteoarthritis, unspecified site: Secondary | ICD-10-CM | POA: Diagnosis not present

## 2016-01-01 DIAGNOSIS — J449 Chronic obstructive pulmonary disease, unspecified: Secondary | ICD-10-CM | POA: Insufficient documentation

## 2016-01-01 DIAGNOSIS — C50812 Malignant neoplasm of overlapping sites of left female breast: Secondary | ICD-10-CM | POA: Diagnosis not present

## 2016-01-01 DIAGNOSIS — Z21 Asymptomatic human immunodeficiency virus [HIV] infection status: Secondary | ICD-10-CM | POA: Diagnosis not present

## 2016-01-01 DIAGNOSIS — Z171 Estrogen receptor negative status [ER-]: Principal | ICD-10-CM

## 2016-01-01 DIAGNOSIS — Z803 Family history of malignant neoplasm of breast: Secondary | ICD-10-CM | POA: Insufficient documentation

## 2016-01-01 DIAGNOSIS — Z85820 Personal history of malignant melanoma of skin: Secondary | ICD-10-CM | POA: Diagnosis not present

## 2016-01-01 DIAGNOSIS — Z95828 Presence of other vascular implants and grafts: Secondary | ICD-10-CM

## 2016-01-01 DIAGNOSIS — F419 Anxiety disorder, unspecified: Secondary | ICD-10-CM | POA: Insufficient documentation

## 2016-01-01 DIAGNOSIS — Z90710 Acquired absence of both cervix and uterus: Secondary | ICD-10-CM | POA: Diagnosis not present

## 2016-01-01 DIAGNOSIS — I10 Essential (primary) hypertension: Secondary | ICD-10-CM | POA: Insufficient documentation

## 2016-01-01 DIAGNOSIS — C50912 Malignant neoplasm of unspecified site of left female breast: Secondary | ICD-10-CM

## 2016-01-01 DIAGNOSIS — E7800 Pure hypercholesterolemia, unspecified: Secondary | ICD-10-CM | POA: Insufficient documentation

## 2016-01-01 HISTORY — PX: BREAST LUMPECTOMY: SHX2

## 2016-01-01 HISTORY — PX: PORTACATH PLACEMENT: SHX2246

## 2016-01-01 HISTORY — PX: BREAST LUMPECTOMY WITH RADIOACTIVE SEED AND SENTINEL LYMPH NODE BIOPSY: SHX6550

## 2016-01-01 SURGERY — BREAST LUMPECTOMY WITH RADIOACTIVE SEED AND SENTINEL LYMPH NODE BIOPSY
Anesthesia: Regional | Site: Chest | Laterality: Right

## 2016-01-01 MED ORDER — SODIUM CHLORIDE 0.9 % IJ SOLN
INTRAVENOUS | Status: DC | PRN
  Administered 2016-01-01: 1 mL

## 2016-01-01 MED ORDER — DEXAMETHASONE SODIUM PHOSPHATE 4 MG/ML IJ SOLN
INTRAMUSCULAR | Status: DC | PRN
  Administered 2016-01-01: 10 mg via INTRAVENOUS

## 2016-01-01 MED ORDER — LIDOCAINE 2% (20 MG/ML) 5 ML SYRINGE
INTRAMUSCULAR | Status: DC | PRN
  Administered 2016-01-01: 60 mg via INTRAVENOUS

## 2016-01-01 MED ORDER — CHLORHEXIDINE GLUCONATE CLOTH 2 % EX PADS: 6.0000 | MEDICATED_PAD | Freq: Once | CUTANEOUS | Status: DC

## 2016-01-01 MED ORDER — METHYLENE BLUE 0.5 % INJ SOLN
INTRAVENOUS | Status: AC
  Filled 2016-01-01: qty 10

## 2016-01-01 MED ORDER — HEPARIN SOD (PORK) LOCK FLUSH 100 UNIT/ML IV SOLN
INTRAVENOUS | Status: DC | PRN
Start: 2016-01-01 — End: 2016-01-01
  Administered 2016-01-01: 400 [IU] via INTRAVENOUS

## 2016-01-01 MED ORDER — LACTATED RINGERS IV SOLN
INTRAVENOUS | Status: DC
  Administered 2016-01-01 (×2): via INTRAVENOUS

## 2016-01-01 MED ORDER — MIDAZOLAM HCL 2 MG/2ML IJ SOLN
INTRAMUSCULAR | Status: AC
  Filled 2016-01-01: qty 2

## 2016-01-01 MED ORDER — MIDAZOLAM HCL 2 MG/2ML IJ SOLN
1.0000 mg | INTRAMUSCULAR | Status: DC | PRN
  Administered 2016-01-01: 1 mg via INTRAVENOUS
  Administered 2016-01-01: 2 mg via INTRAVENOUS
  Administered 2016-01-01: 1 mg via INTRAVENOUS

## 2016-01-01 MED ORDER — EPHEDRINE SULFATE 50 MG/ML IJ SOLN
INTRAMUSCULAR | Status: DC | PRN
  Administered 2016-01-01 (×4): 10 mg via INTRAVENOUS

## 2016-01-01 MED ORDER — PROMETHAZINE HCL 25 MG/ML IJ SOLN: 6.2500 mg | INTRAMUSCULAR | Status: DC | PRN

## 2016-01-01 MED ORDER — GABAPENTIN 300 MG PO CAPS
ORAL_CAPSULE | ORAL | Status: AC
  Filled 2016-01-01: qty 1

## 2016-01-01 MED ORDER — FENTANYL CITRATE (PF) 100 MCG/2ML IJ SOLN
INTRAMUSCULAR | Status: AC
  Filled 2016-01-01: qty 2

## 2016-01-01 MED ORDER — TECHNETIUM TC 99M SULFUR COLLOID FILTERED
1.0000 | Freq: Once | INTRAVENOUS | Status: AC | PRN
  Administered 2016-01-01: 1 via INTRADERMAL

## 2016-01-01 MED ORDER — HYDROCODONE-ACETAMINOPHEN 5-325 MG PO TABS: 1.0000 | ORAL_TABLET | Freq: Four times a day (QID) | ORAL | 0 refills | Status: DC | PRN

## 2016-01-01 MED ORDER — CEFAZOLIN SODIUM-DEXTROSE 2-4 GM/100ML-% IV SOLN
2.0000 g | INTRAVENOUS | Status: AC
  Administered 2016-01-01: 2 g via INTRAVENOUS

## 2016-01-01 MED ORDER — PHENYLEPHRINE HCL 10 MG/ML IJ SOLN
INTRAMUSCULAR | Status: DC | PRN
  Administered 2016-01-01 (×4): 80 ug via INTRAVENOUS

## 2016-01-01 MED ORDER — ACETAMINOPHEN 500 MG PO TABS
1000.0000 mg | ORAL_TABLET | ORAL | Status: AC
  Administered 2016-01-01: 1000 mg via ORAL

## 2016-01-01 MED ORDER — KETOROLAC TROMETHAMINE 30 MG/ML IJ SOLN: 30.0000 mg | Freq: Once | INTRAMUSCULAR | Status: DC | PRN

## 2016-01-01 MED ORDER — GABAPENTIN 300 MG PO CAPS
300.0000 mg | ORAL_CAPSULE | ORAL | Status: AC
  Administered 2016-01-01: 300 mg via ORAL

## 2016-01-01 MED ORDER — SCOPOLAMINE 1 MG/3DAYS TD PT72
1.0000 | MEDICATED_PATCH | Freq: Once | TRANSDERMAL | Status: DC
  Administered 2016-01-01: 1.5 mg via TRANSDERMAL

## 2016-01-01 MED ORDER — BUPIVACAINE-EPINEPHRINE (PF) 0.5% -1:200000 IJ SOLN
INTRAMUSCULAR | Status: DC | PRN
  Administered 2016-01-01: 25 mL

## 2016-01-01 MED ORDER — ONDANSETRON HCL 4 MG/2ML IJ SOLN
INTRAMUSCULAR | Status: AC
  Filled 2016-01-01: qty 2

## 2016-01-01 MED ORDER — DEXAMETHASONE SODIUM PHOSPHATE 10 MG/ML IJ SOLN
INTRAMUSCULAR | Status: AC
  Filled 2016-01-01: qty 1

## 2016-01-01 MED ORDER — PROPOFOL 10 MG/ML IV BOLUS
INTRAVENOUS | Status: AC
  Filled 2016-01-01: qty 20

## 2016-01-01 MED ORDER — FENTANYL CITRATE (PF) 100 MCG/2ML IJ SOLN
50.0000 ug | INTRAMUSCULAR | Status: AC | PRN
Start: 2016-01-01 — End: 2016-01-01
  Administered 2016-01-01 (×4): 50 ug via INTRAVENOUS

## 2016-01-01 MED ORDER — ACETAMINOPHEN 500 MG PO TABS
ORAL_TABLET | ORAL | Status: AC
  Filled 2016-01-01: qty 2

## 2016-01-01 MED ORDER — CEFAZOLIN SODIUM-DEXTROSE 2-4 GM/100ML-% IV SOLN: 2.0000 g | INTRAVENOUS | Status: DC

## 2016-01-01 MED ORDER — HEPARIN (PORCINE) IN NACL 2-0.9 UNIT/ML-% IJ SOLN
INTRAMUSCULAR | Status: AC
  Filled 2016-01-01: qty 500

## 2016-01-01 MED ORDER — HEPARIN SOD (PORK) LOCK FLUSH 100 UNIT/ML IV SOLN
INTRAVENOUS | Status: AC
  Filled 2016-01-01: qty 5

## 2016-01-01 MED ORDER — CEFAZOLIN SODIUM-DEXTROSE 2-4 GM/100ML-% IV SOLN
INTRAVENOUS | Status: AC
  Filled 2016-01-01: qty 100

## 2016-01-01 MED ORDER — SODIUM CHLORIDE 0.9 % IJ SOLN
INTRAMUSCULAR | Status: AC
  Filled 2016-01-01: qty 10

## 2016-01-01 MED ORDER — HEPARIN (PORCINE) IN NACL 2-0.9 UNIT/ML-% IJ SOLN
INTRAMUSCULAR | Status: DC | PRN
  Administered 2016-01-01: 500 mL via INTRAVENOUS

## 2016-01-01 MED ORDER — HYDROMORPHONE HCL 1 MG/ML IJ SOLN: 0.2500 mg | INTRAMUSCULAR | Status: DC | PRN

## 2016-01-01 MED ORDER — SCOPOLAMINE 1 MG/3DAYS TD PT72
MEDICATED_PATCH | TRANSDERMAL | Status: AC
  Filled 2016-01-01: qty 1

## 2016-01-01 MED ORDER — BUPIVACAINE-EPINEPHRINE (PF) 0.25% -1:200000 IJ SOLN
INTRAMUSCULAR | Status: AC
  Filled 2016-01-01: qty 30

## 2016-01-01 MED ORDER — LIDOCAINE 2% (20 MG/ML) 5 ML SYRINGE
INTRAMUSCULAR | Status: AC
  Filled 2016-01-01: qty 5

## 2016-01-01 MED ORDER — PROPOFOL 10 MG/ML IV BOLUS
INTRAVENOUS | Status: DC | PRN
  Administered 2016-01-01: 150 mg via INTRAVENOUS

## 2016-01-01 MED ORDER — BUPIVACAINE-EPINEPHRINE 0.25% -1:200000 IJ SOLN
INTRAMUSCULAR | Status: DC | PRN
  Administered 2016-01-01: 20 mL
  Administered 2016-01-01: 7 mL

## 2016-01-01 SURGICAL SUPPLY — 74 items
ADH SKN CLS APL DERMABOND .7 (GAUZE/BANDAGES/DRESSINGS) ×4
APL SKNCLS STERI-STRIP NONHPOA (GAUZE/BANDAGES/DRESSINGS) ×2
BAG DECANTER FOR FLEXI CONT (MISCELLANEOUS) ×3 IMPLANT
BENZOIN TINCTURE PRP APPL 2/3 (GAUZE/BANDAGES/DRESSINGS) ×3 IMPLANT
BINDER BREAST LRG (GAUZE/BANDAGES/DRESSINGS) IMPLANT
BINDER BREAST MEDIUM (GAUZE/BANDAGES/DRESSINGS) IMPLANT
BINDER BREAST XLRG (GAUZE/BANDAGES/DRESSINGS) ×1 IMPLANT
BINDER BREAST XXLRG (GAUZE/BANDAGES/DRESSINGS) IMPLANT
BLADE SURG 15 STRL LF DISP TIS (BLADE) ×2 IMPLANT
BLADE SURG 15 STRL SS (BLADE) ×6
CANISTER SUC SOCK COL 7IN (MISCELLANEOUS) IMPLANT
CANISTER SUCT 1200ML W/VALVE (MISCELLANEOUS) ×3 IMPLANT
CHLORAPREP W/TINT 26ML (MISCELLANEOUS) ×4 IMPLANT
CLEANER CAUTERY TIP 5X5 PAD (MISCELLANEOUS) ×2 IMPLANT
CLIP TI WIDE RED SMALL 6 (CLIP) ×3 IMPLANT
COVER BACK TABLE 60X90IN (DRAPES) ×3 IMPLANT
COVER MAYO STAND STRL (DRAPES) ×3 IMPLANT
COVER PROBE 5X48 (MISCELLANEOUS) ×3
COVER PROBE W GEL 5X96 (DRAPES) ×3 IMPLANT
DECANTER SPIKE VIAL GLASS SM (MISCELLANEOUS) IMPLANT
DERMABOND ADVANCED (GAUZE/BANDAGES/DRESSINGS) ×2
DERMABOND ADVANCED .7 DNX12 (GAUZE/BANDAGES/DRESSINGS) ×2 IMPLANT
DEVICE DUBIN W/COMP PLATE 8390 (MISCELLANEOUS) ×3 IMPLANT
DRAPE C-ARM 42X72 X-RAY (DRAPES) ×3 IMPLANT
DRAPE LAPAROSCOPIC ABDOMINAL (DRAPES) ×3 IMPLANT
DRAPE LAPAROTOMY T 102X78X121 (DRAPES) ×3 IMPLANT
DRAPE UTILITY XL STRL (DRAPES) ×4 IMPLANT
DRSG PAD ABDOMINAL 8X10 ST (GAUZE/BANDAGES/DRESSINGS) IMPLANT
ELECT COATED BLADE 2.86 ST (ELECTRODE) ×4 IMPLANT
ELECT REM PT RETURN 9FT ADLT (ELECTROSURGICAL) ×3
ELECTRODE REM PT RTRN 9FT ADLT (ELECTROSURGICAL) ×2 IMPLANT
GAUZE SPONGE 4X4 12PLY STRL (GAUZE/BANDAGES/DRESSINGS) ×3 IMPLANT
GLOVE BIOGEL PI IND STRL 7.5 (GLOVE) IMPLANT
GLOVE BIOGEL PI INDICATOR 7.5 (GLOVE) ×3
GLOVE SURG SIGNA 7.5 PF LTX (GLOVE) ×7 IMPLANT
GLOVE SURG SYN 7.5  E (GLOVE) ×2
GLOVE SURG SYN 7.5 E (GLOVE) ×4 IMPLANT
GLOVE SURG SYN 7.5 PF PI (GLOVE) IMPLANT
GOWN STRL REUS W/ TWL LRG LVL3 (GOWN DISPOSABLE) ×2 IMPLANT
GOWN STRL REUS W/ TWL XL LVL3 (GOWN DISPOSABLE) ×2 IMPLANT
GOWN STRL REUS W/TWL LRG LVL3 (GOWN DISPOSABLE) ×9
GOWN STRL REUS W/TWL XL LVL3 (GOWN DISPOSABLE) ×6
IV CATH AUTO 14GX1.75 SAFE ORG (IV SOLUTION) IMPLANT
IV CATH PLACEMENT UNIT 16 GA (IV SOLUTION) IMPLANT
IV KIT MINILOC 20X1 SAFETY (NEEDLE) IMPLANT
KIT CVR 48X5XPRB PLUP LF (MISCELLANEOUS) IMPLANT
KIT MARKER MARGIN INK (KITS) ×3 IMPLANT
KIT PORT POWER 8FR ISP CVUE (Catheter) ×1 IMPLANT
NDL HYPO 25X1 1.5 SAFETY (NEEDLE) ×2 IMPLANT
NDL SAFETY ECLIPSE 18X1.5 (NEEDLE) IMPLANT
NEEDLE HYPO 18GX1.5 SHARP (NEEDLE)
NEEDLE HYPO 25X1 1.5 SAFETY (NEEDLE) ×6 IMPLANT
NS IRRIG 1000ML POUR BTL (IV SOLUTION) ×3 IMPLANT
PACK BASIN DAY SURGERY FS (CUSTOM PROCEDURE TRAY) ×3 IMPLANT
PAD CLEANER CAUTERY TIP 5X5 (MISCELLANEOUS) ×1
PENCIL BUTTON HOLSTER BLD 10FT (ELECTRODE) ×3 IMPLANT
SET SHEATH INTRODUCER 10FR (MISCELLANEOUS) IMPLANT
SHEATH COOK PEEL AWAY SET 9F (SHEATH) IMPLANT
SHEET MEDIUM DRAPE 40X70 STRL (DRAPES) ×3 IMPLANT
SLEEVE SCD COMPRESS KNEE MED (MISCELLANEOUS) ×3 IMPLANT
SPONGE GAUZE 4X4 12PLY STER LF (GAUZE/BANDAGES/DRESSINGS) ×4 IMPLANT
SPONGE LAP 18X18 X RAY DECT (DISPOSABLE) ×3 IMPLANT
STRIP CLOSURE SKIN 1/2X4 (GAUZE/BANDAGES/DRESSINGS) IMPLANT
STRIP CLOSURE SKIN 1/4X4 (GAUZE/BANDAGES/DRESSINGS) ×3 IMPLANT
SUT ETHILON 2 0 FS 18 (SUTURE) IMPLANT
SUT ETHILON 3 0 PS 1 (SUTURE) IMPLANT
SUT MNCRL AB 4-0 PS2 18 (SUTURE) ×4 IMPLANT
SUT VICRYL 3-0 CR8 SH (SUTURE) ×4 IMPLANT
SYR 5ML LUER SLIP (SYRINGE) ×3 IMPLANT
SYR CONTROL 10ML LL (SYRINGE) ×4 IMPLANT
TOWEL OR 17X24 6PK STRL BLUE (TOWEL DISPOSABLE) ×4 IMPLANT
TOWEL OR NON WOVEN STRL DISP B (DISPOSABLE) ×2 IMPLANT
TUBE CONNECTING 20X1/4 (TUBING) ×3 IMPLANT
YANKAUER SUCT BULB TIP NO VENT (SUCTIONS) ×3 IMPLANT

## 2016-01-01 NOTE — Interval H&P Note (Signed)
History and Physical Interval Note:  01/01/2016 7:21 AM  [name] Noell  has presented today for surgery, with the diagnosis of LEFT BREAST CANCER  The various methods of treatment have been discussed with the patient and family.   Seed in good location.  Husband and son, [name], with patient.  After consideration of risks, benefits and other options for treatment, the patient has consented to  Procedure(s): BREAST LUMPECTOMY WITH RADIOACTIVE SEED AND SENTINEL LYMPH NODE BIOPSY (Left) INSERTION PORT-A-CATH WITH [name] (N/A) as a surgical intervention .  The patient's history has been reviewed, patient examined, no change in status, stable for surgery.  I have reviewed the patient's chart and labs.  Questions were answered to the patient's satisfaction.     [name]

## 2016-01-01 NOTE — Op Note (Signed)
01/01/2016  9:54 AM  PATIENT:  [name] DOB: 1950-12-27 MRN: [id number]  PREOP DIAGNOSIS:  LEFT BREAST CANCER  POSTOP DIAGNOSIS:   Left breast cancer, 12:30 o'clock position (T1, N0)  PROCEDURE:   Procedure(s):  LEFT BREAST LUMPECTOMY WITH RADIOACTIVE SEED AND Deep SENTINEL LYMPH NODE BIOPSY, INSERTION PORT-A-CATH WITH US guidance, Injection of peri areolar area of breast with methylene blue (1.0 cc)  SURGEON:   Alphonsa Overall, M.D.  ANESTHESIA:   general  Anesthesiologist: Suzette Battiest, MD CRNA: Maryella Shivers, CRNA  General  EBL:  minimal  ml  DRAINS: none   LOCAL MEDICATIONS USED:   30 cc 1/4% marcaine,  Left pectoral block  SPECIMEN:   Left breast lumpectomy, left axillary sentinel lymph node (counts - 600, background - 0, blue)  COUNTS CORRECT:  YES  INDICATIONS FOR PROCEDURE:  [name] is a 65 y.o. (DOB: [birth year]) white female whose primary care physician is BURNETT,BRENT A, MD and comes for left breast lumpectomy and left axillary sentinel lymph node biopsy.  She is also for a power port placement.   She was seen at the [facility] on 11/12/2015 with Drs. Magarinat and Kinard.  She has a triple negative breast cancer of the left breast.  The mammoprint came back as high risk, so the plan will be for post op adjuvant chemotherapy.   The options for breast cancer treatment have been discussed with the patient. She elected to proceed with lumpectomy and axillary sentinel lymph node.     The indications and potential complications of surgery were explained to the patient. Potential complications include, but are not limited to, bleeding, infection, the need for further surgery, and nerve injury.     She had a I131 seed placed on 12/28/2015 in her left breast at [facility].  I confirmed the presence of the I131 seed in the pre op area using the Neoprobe.  The seed is in the 12:30 o'clock position of the left breast.   In the holding area, her left  areola was injected with 1 millicurie of Technitium Sulfur Colloid.  OPERATIVE NOTE:   The patient was taken to room # 7 at [facility] where she underwent a general anesthesia  supervised by Anesthesiologist: Suzette Battiest, MD CRNA: Maryella Shivers, CRNA. Her left  breast and axilla were prepped with  ChloraPrep and sterilely draped.    A time-out and the surgical check list was reviewed.    I injected about 0.5 mL of 40% methylene blue around her left areola.   I turned attention to the cancer which was about at the 12:30 o'clock position of the left breast.   I used the Neoprobe to identify the I131 seed.  I tried to excise an area around the tumor of at least 1 cm.    I excised this block of breast tissue approximately 4 cm by 5 cm  in diameter.   I painted the lumpectomy specimen with the 6 color paint kit and did a specimen mammogram which confirmed the mass, clip, and the seed were all in the right position in the specimen.  The specimen was sent to pathology who called back to confirm that they have the seed and the specimen.   I then started the left deep axillary sentinel lymph node biopsy. I made an incision in the left axilla.  I found a hot area at the junction of the breast and the pectoralis major muscle, deep in the axilla.  I cut down and  identified a hot node that had counts of 600 and the background has 0 counts. The lymph node was blue. I checked her internal mammary nodes and supraclavicular nodes with the neoprobe and found no other hot area. The axillary node was then sent to pathology.    I then irrigated the wound with saline. I infiltrated approximately 20 mL of 1/4% Marcaine between the incisions.  I placed 4 clips to mark biopsy cavity, at 12, 3, 6, and 9 o'clock. I then closed all the wounds in layers using 3-0 Vicryl sutures for the deep layer. At the skin, I closed the incisions with a 4-0 Monocryl suture.    I then repositioned the patient and set her up for  the power port placement.  A time out was held and the surgery checklist reviewed.   The patient was placed in Trendelenburg position.  I first tried to access the right subclavian vein, but got no return with 2 sticks.  I then used the US probe to identify the right internal jugular vein.  The right internal jugular vein was accessed with a 16 gauge needle under US guidance and a guide wire threaded through the needle into the vein.  The position of the wire was checked with fluoroscopy.   I then developed a pocket in the upper inner aspect of the right chest for the port reservoir.  I used the [employer] for venous access.  The reservoir was sewn in place with a 3-0 Vicryl suture.  The reservoir had been flushed with dilute (10 units/cc) heparin.   I then passed the silastic tubing from the reservoir incision to the subclavian incision site, to the right neck stick site and used the 8 French introducer to pass it into the vein.  The tip of the silastic catheter was position at the junction of the SVC and the right atrium under fluoroscopy.  The silastic catheter was then attached to the port with the bayonet device.     The entire port and tubing were checked with fluoroscopy and then the port was flushed with 4 cc of concentrated heparin (100 units/cc).   The wounds were then closed with 3-0 vicryl subcutaneous sutures and the skin closed with a 4-0 Monocryl suture.     All the incisions were then painted with Dermabond.  She had gauze place over the wounds and she was placed in a breast binder.   The patient tolerated the procedure well, was transported to the recovery room in good condition. Sponge and needle count were correct at the end of the case.   Final pathology is pending. CXR is pending at the time of this dictation.   Alphonsa Overall, MD, [facility] Surgery Pager: [phone] Office phone:  [phone]

## 2016-01-01 NOTE — Progress Notes (Signed)
Assisted Dr. Rob Fitzgerald with left, ultrasound guided, pectoralis block. Side rails up, monitors on throughout procedure. See vital signs in flow sheet. Tolerated Procedure well. 

## 2016-01-01 NOTE — Anesthesia Preprocedure Evaluation (Addendum)
Anesthesia Evaluation  Patient identified by MRN, date of birth, ID band Patient awake    Reviewed: Allergy & Precautions, NPO status , Patient's Chart, lab work & pertinent test results  Airway Mallampati: II  TM Distance: >3 FB Neck ROM: Full    Dental  (+) Dental Advisory Given   Pulmonary COPD,    breath sounds clear to auscultation       Cardiovascular hypertension, Pt. on medications  Rhythm:Regular Rate:Normal     Neuro/Psych  Headaches,  Neuromuscular disease    GI/Hepatic negative GI ROS, Neg liver ROS,   Endo/Other  negative endocrine ROS  Renal/GU negative Renal ROS     Musculoskeletal   Abdominal   Peds  Hematology  (+) HIV,   Anesthesia Other Findings   Reproductive/Obstetrics                            Anesthesia Physical Anesthesia Plan  ASA: II  Anesthesia Plan: General and Regional   Post-op Pain Management:  Regional for Post-op pain   Induction: Intravenous  Airway Management Planned: LMA  Additional Equipment:   Intra-op Plan:   Post-operative Plan: Extubation in OR  Informed Consent: I have reviewed the patients History and Physical, chart, labs and discussed the procedure including the risks, benefits and alternatives for the proposed anesthesia with the patient or authorized representative who has indicated his/her understanding and acceptance.   Dental advisory given  Plan Discussed with: CRNA  Anesthesia Plan Comments:         Anesthesia Quick Evaluation

## 2016-01-01 NOTE — Anesthesia Postprocedure Evaluation (Signed)
Anesthesia Post Note  Patient: [name]  Procedure(s) Performed: Procedure(s) (LRB): LEFT BREAST LUMPECTOMY WITH RADIOACTIVE SEED AND SENTINEL LYMPH NODE BIOPSY (Left) INSERTION PORT-A-CATH WITH [name] (Right)  Patient location during evaluation: PACU Anesthesia Type: General and Regional Level of consciousness: awake and alert Pain management: pain level controlled Vital Signs Assessment: post-procedure vital signs reviewed and stable Respiratory status: spontaneous breathing, nonlabored ventilation, respiratory function stable and patient connected to nasal cannula oxygen Cardiovascular status: blood pressure returned to baseline and stable Postop Assessment: no signs of nausea or vomiting Anesthetic complications: no    Last Vitals:  Vitals:   01/01/16 1015 01/01/16 1030  BP: (!) 121/58 (!) 114/52  Pulse: 93 85  Resp: 15 18  Temp:      Last Pain:  Vitals:   01/01/16 1030  PainSc: 0-No pain                 Tiajuana Amass

## 2016-01-01 NOTE — Discharge Instructions (Signed)
[facility] - DISCHARGE INSTRUCTIONS TO PATIENT  Activity:  Driving - May drive in 2 or 3 days, if doing well   Lifting - No lifting more than 15 pounds for 1 week, then no limit  Wound Care:   Leave incisions dry for 2 days, then may shower.  Diet:  As tolerated  Follow up appointment:  Call Dr. Pollie Friar office [facility]) at [phone] for an appointment in 2 - 3 weeks.  Medications and dosages:  Resume your home medications.  You have a prescription for:  Vicodin.  You may also use Ibuprofen or Aleve.  Call Dr. Lucia Gaskins or his office  [phone]) if you have:  Temperature greater than 100.4,  Persistent nausea and vomiting,  Severe uncontrolled pain,  Redness, tenderness, or signs of infection (pain, swelling, redness, odor or green/yellow discharge around the site),  Difficulty breathing, headache or visual disturbances,  Any other questions or concerns you may have after discharge.  In an emergency, call 911 or go to an Emergency Department at a nearby hospital.    Post Anesthesia Home Care Instructions  Activity: Get plenty of rest for the remainder of the day. A responsible adult should stay with you for 24 hours following the procedure.  For the next 24 hours, DO NOT: -Drive a car -Paediatric nurse -Drink alcoholic beverages -Take any medication unless instructed by your physician -Make any legal decisions or sign important papers.  Meals: Start with liquid foods such as gelatin or soup. Progress to regular foods as tolerated. Avoid greasy, spicy, heavy foods. If nausea and/or vomiting occur, drink only clear liquids until the nausea and/or vomiting subsides. Call your physician if vomiting continues.  Special Instructions/Symptoms: Your throat may feel dry or sore from the anesthesia or the breathing tube placed in your throat during surgery. If this causes discomfort, gargle with warm salt water. The discomfort should disappear within  24 hours.  If you had a scopolamine patch placed behind your ear for the management of post- operative nausea and/or vomiting:  1. The medication in the patch is effective for 72 hours, after which it should be removed.  Wrap patch in a tissue and discard in the trash. Wash hands thoroughly with soap and water. 2. You may remove the patch earlier than 72 hours if you experience unpleasant side effects which may include dry mouth, dizziness or visual disturbances. 3. Avoid touching the patch. Wash your hands with soap and water after contact with the patch.

## 2016-01-01 NOTE — Anesthesia Procedure Notes (Signed)
Procedure Name: LMA Insertion Date/Time: 01/01/2016 7:47 AM Performed by: Maryella Shivers Pre-anesthesia Checklist: Patient identified, Emergency Drugs available, Suction available and Patient being monitored Patient Re-evaluated:Patient Re-evaluated prior to inductionOxygen Delivery Method: Circle system utilized Preoxygenation: Pre-oxygenation with 100% oxygen Intubation Type: IV induction Ventilation: Mask ventilation without difficulty LMA: LMA inserted LMA Size: 4.0 Number of attempts: 1 Airway Equipment and Method: Bite block Placement Confirmation: positive ETCO2 Tube secured with: Tape Dental Injury: Teeth and Oropharynx as per pre-operative assessment

## 2016-01-01 NOTE — Anesthesia Procedure Notes (Signed)
Anesthesia Regional Block:  Pectoralis block  Pre-Anesthetic Checklist: ,, timeout performed, Correct Patient, Correct Site, Correct Laterality, Correct Procedure, Correct Position, site marked, Risks and benefits discussed,  Surgical consent,  Pre-op evaluation,  At surgeon's request and post-op pain management  Laterality: Left  Prep: chloraprep       Needles:  Injection technique: Single-shot  Needle Type: Echogenic Needle     Needle Length: 9cm 9 cm Needle Gauge: 21 and 21 G    Additional Needles:  Procedures: ultrasound guided (picture in chart) Pectoralis block Narrative:  Start time: 01/01/2016 7:23 AM End time: 01/01/2016 7:30 AM Injection made incrementally with aspirations every 5 mL.  Performed by: Personally  Anesthesiologist: Suzette Battiest

## 2016-01-01 NOTE — Transfer of Care (Signed)
Immediate Anesthesia Transfer of Care Note  Patient: [name]  Procedure(s) Performed: Procedure(s): LEFT BREAST LUMPECTOMY WITH RADIOACTIVE SEED AND SENTINEL LYMPH NODE BIOPSY (Left) INSERTION PORT-A-CATH WITH [name] (Right)  Patient Location: PACU  Anesthesia Type:General  Level of Consciousness: sedated  Airway & Oxygen Therapy: Patient Spontanous Breathing and Patient connected to face mask oxygen  Post-op Assessment: Report given to RN and Post -op Vital signs reviewed and stable  Post vital signs: Reviewed and stable  Last Vitals:  Vitals:   01/01/16 0630  BP: 135/71  Pulse: 88  Resp: 18  Temp: 36.5 C    Last Pain: There were no vitals filed for this visit.       Complications: No apparent anesthesia complications

## 2016-01-02 ENCOUNTER — Encounter (HOSPITAL_BASED_OUTPATIENT_CLINIC_OR_DEPARTMENT_OTHER): Payer: Self-pay | Admitting: Surgery

## 2016-01-05 ENCOUNTER — Other Ambulatory Visit: Payer: BLUE CROSS/BLUE SHIELD

## 2016-01-05 ENCOUNTER — Ambulatory Visit: Payer: BLUE CROSS/BLUE SHIELD | Admitting: Oncology

## 2016-01-11 ENCOUNTER — Telehealth: Payer: Self-pay | Admitting: *Deleted

## 2016-01-11 NOTE — Telephone Encounter (Signed)
Pt called wishing to cancel her genetics appt on 01/30/16 at this time. Will call and r/s at a later time. Denies further questions or needs at this time.

## 2016-01-19 ENCOUNTER — Other Ambulatory Visit: Payer: Self-pay | Admitting: *Deleted

## 2016-01-19 ENCOUNTER — Ambulatory Visit (HOSPITAL_BASED_OUTPATIENT_CLINIC_OR_DEPARTMENT_OTHER): Payer: BLUE CROSS/BLUE SHIELD | Admitting: Oncology

## 2016-01-19 ENCOUNTER — Other Ambulatory Visit (HOSPITAL_BASED_OUTPATIENT_CLINIC_OR_DEPARTMENT_OTHER): Payer: BLUE CROSS/BLUE SHIELD

## 2016-01-19 ENCOUNTER — Telehealth: Payer: Self-pay | Admitting: Oncology

## 2016-01-19 VITALS — BP 146/78 | HR 82 | Temp 97.7°F | Resp 17 | Ht 65.0 in | Wt 149.9 lb

## 2016-01-19 DIAGNOSIS — Z21 Asymptomatic human immunodeficiency virus [HIV] infection status: Secondary | ICD-10-CM | POA: Diagnosis not present

## 2016-01-19 DIAGNOSIS — Z171 Estrogen receptor negative status [ER-]: Principal | ICD-10-CM

## 2016-01-19 DIAGNOSIS — C50412 Malignant neoplasm of upper-outer quadrant of left female breast: Secondary | ICD-10-CM

## 2016-01-19 MED ORDER — DEXAMETHASONE 4 MG PO TABS: ORAL_TABLET | ORAL | 1 refills | Status: DC

## 2016-01-19 MED ORDER — PROCHLORPERAZINE MALEATE 10 MG PO TABS: 10.0000 mg | ORAL_TABLET | Freq: Four times a day (QID) | ORAL | 1 refills | Status: DC | PRN

## 2016-01-19 MED ORDER — LORAZEPAM 0.5 MG PO TABS: 0.5000 mg | ORAL_TABLET | Freq: Four times a day (QID) | ORAL | 0 refills | Status: DC | PRN

## 2016-01-19 MED ORDER — LIDOCAINE-PRILOCAINE 2.5-2.5 % EX CREA: TOPICAL_CREAM | CUTANEOUS | 3 refills | Status: DC

## 2016-01-19 NOTE — Progress Notes (Signed)
[name]  [phone]) [phone] [phone]     ID: [name] DOB: 1950-10-26  MR#: [id number]  POE#:[id number]  Patient Care Team: [name] Shire, MD as PCP - General (Family Medicine) [name] Basques, MD as PCP - Infectious Diseases (Infectious Diseases) [name] Overall, MD as Consulting Physician (General Surgery) [name] Cruel, MD as Consulting Physician (Oncology) [name] Pray, MD as Consulting Physician (Radiation Oncology) [name] Pacas, MD as Consulting Physician (Neurology) [name] Brownie, MD as Consulting Physician (Dermatology) [name] Cruel, MD OTHER MD:  CHIEF COMPLAINT: Triple negative breast cancer  CURRENT TREATMENT: Adjuvant chemotherapy   BREAST CANCER HISTORY:  From the origil intake note:  [name] herself palpated a mass in the upper outer quadrant of her left breast the first week in November, and had bilateral diagnostic mammography with tomography and left breast ultrasonography at the [facility] 12/05/2015. The breast density was category C. In the upper outer left breast there was  A mass with macro lobulated contours which was barely palpable at the 12:30 o'clock position of the left breast 7 cm from the nipple. There was no palpable axillary adenopathy. Ultrasonography confirmed a 2.1 cm obliquely oriented hypoechoic mass in the area in question. Ultrasound of the left axilla was benign.  Biopsy of the left breast mass in question 12/07/2015 showed ([id number]) invasive ductal carcinoma, grade 3, estrogen and progesterone receptor negative, with an MIB-1 of 35%, and no HER-2 amplification, the signals ratio being 0.95 and the number per cell 2.80.  Her subsequent history is as detailed below  INTERVAL HISTORY: [name] returns today for follow-up of her estrogen receptor negative breast cancer accompanied by her husband and son. Since her last visit hereshe underwent left lumpectomy and sentinel lymph node sampling. The  final pathology (01/01/2016, [id number]) showed an invasive ductal carcinoma, grade 3, measuring 1.9 cm. Margins were ample. The single sentinel lymph node was clear.  We obtained a Mammaprint from her original biopsy sample and this came back "high risk". Accordingly a port was placed at the time of her lumpectomy. She is here today to discuss chemotherapy plans   REVIEW OF SYSTEMS: [name] "did good" with her surgery and she feels she is healing well. There was no significant fever, pain, or bleeding, problem. The port site is a little bit itchy and she is using Benadryl cream for that. The itchiness is not exactly on the incision but are rounded and suggest that she may be allergic to either blue or iodine. She has a little bit of joint pain particularly involving the right shoulder and the left ankle. She also has a history of migraines. These are not new concerns. She is anxious because of her history of HIV positivity and the fact that chemotherapy can affect the immune system. A detailed review of systems today was otherwise stable     PAST MEDICAL HISTORY: Past Medical History:  Diagnosis Date  . HIV (human immunodeficiency virus infection) ([place])   . Hypertension   . Malignant neoplasm of upper-outer quadrant of left female breast ([place]) 12/11/2015  . Migraine   . Neuropathy ([place])   . PONV (postoperative nausea and vomiting)    said usually has to have scop patch  . Rash and nonspecific skin eruption 10/16/2015  . Spinal stenosis     PAST SURGICAL HISTORY: Past Surgical History:  Procedure Laterality Date  . ABDOMINAL HYSTERECTOMY  1990  . BREAST LUMPECTOMY WITH RADIOACTIVE SEED AND SENTINEL LYMPH NODE BIOPSY Left 01/01/2016  Procedure: LEFT BREAST LUMPECTOMY WITH RADIOACTIVE SEED AND SENTINEL LYMPH NODE BIOPSY;  Surgeon: [name] Overall, MD;  Location: [facility];  Service: General;  Laterality: Left;  . CARPAL TUNNEL RELEASE     both hands  . CERVICAL FUSION  1989    . PORTACATH PLACEMENT Right 01/01/2016   Procedure: INSERTION PORT-A-CATH WITH [name];  Surgeon: [name] Overall, MD;  Location: [facility];  Service: General;  Laterality: Right;  . TONSILLECTOMY      FAMILY HISTORY Family History  Problem Relation Age of Onset  . High blood pressure Mother   . High Cholesterol Mother   . Cancer Father   . High blood pressure Father   . High Cholesterol Father   . Lung cancer Maternal Grandfather   . Breast cancer Maternal Aunt 83  The patient's father died from heart disease at the age of 1. The patient's mother died from "genetic cirrhosis" at the age of 93. The patient had 2 brothers, 5 sisters. A maternal aunt was diagnosed with breast cancer at age 38. There is no other history of breast or ovarian cancer in the family to the patient's knowledge   GYNECOLOGIC HISTORY:  No LMP recorded. Patient has had a hysterectomy. Menarche age 35, first live birth age 5. Patient is GX P1. She underwent hysterectomy with bilateral salpingo-oophorectomy at age 82. She took hormone replacement for approximately 10 years, until age 5. She also had oral contraceptives remotely for 8-10 years, with no complications.   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 At home is just she and her husband [name] who is retired from Teaching laboratory technician work. Their son [name] works as a Chief Strategy Officer for rest home in [place]. The patient has 3 grandchildren. She is not a Ambulance person.    ADVANCED DIRECTIVES: The patient has a living will in place   HEALTH MAINTENANCE: Social History  Substance Use Topics  . Smoking status: Never Smoker  . Smokeless tobacco: Never Used  . Alcohol use 0.6 oz/week    1 Glasses of wine per week     Comment: on weekends     Colonoscopy: 2012/be routinely  PAP: Status post hysterectomy  Bone density: Never   No Known Allergies  Current Outpatient Prescriptions  Medication Sig Dispense Refill  .  amLODipine (NORVASC) 5 MG tablet Take 5 mg by mouth daily.    . benazepril (LOTENSIN) 40 MG tablet Take 40 mg by mouth daily.      . Coenzyme Q10 300 MG CAPS Take 300 capsules by mouth daily.    . cyclobenzaprine (FLEXERIL) 10 MG tablet Take 1 tablet (10 mg total) by mouth at bedtime. 90 tablet 3  . dexamethasone (DECADRON) 4 MG tablet Take 2 tablets by mouth once a day on the day after chemotherapy and then take 2 tablets two times a day for 2 days. Take with food. 30 tablet 1  . dolutegravir (TIVICAY) 50 MG tablet Take 1 tablet (50 mg total) by mouth daily. 30 tablet 5  . emtricitabine-tenofovir AF (DESCOVY) 200-25 MG tablet Take 1 tablet by mouth daily. 30 tablet 5  . flunisolide (NASALIDE) 25 MCG/ACT (0.025%) SOLN Place 2 sprays into the nose 2 (two) times daily. 1 Bottle 1  . gabapentin (NEURONTIN) 100 MG capsule Take 1 capsule (100 mg total) by mouth 2 (two) times daily. 180 capsule 3  . lidocaine-prilocaine (EMLA) cream Apply to affected area once 30 g 3  . LORazepam (  ATIVAN) 0.5 MG tablet Take 1 tablet (0.5 mg total) by mouth every 6 (six) hours as needed (Nausea or vomiting). 30 tablet 0  . naproxen sodium (ANAPROX) 220 MG tablet Take 220 mg by mouth daily.     . Omega-3 Fatty Acids (FISH OIL) 1200 MG CAPS Take 1,200 mg by mouth 2 (two) times daily.      . prochlorperazine (COMPAZINE) 10 MG tablet Take 1 tablet (10 mg total) by mouth every 6 (six) hours as needed (Nausea or vomiting). 30 tablet 1  . rosuvastatin (CRESTOR) 5 MG tablet Take 1 tablet (5 mg total) by mouth at bedtime. 90 tablet 3   No current facility-administered medications for this visit.     OBJECTIVE: Middle-aged white woman In no acute distress  Vitals:   01/19/16 0941  BP: (!) 146/78  Pulse: 82  Resp: 17  Temp: 97.7 F (36.5 C)     Body mass index is 24.94 kg/m.    ECOG FS:1 - Symptomatic but completely ambulatory  Sclerae unicteric, pupils round and equal Oropharynx clear and moist-- no thrush or other  lesions No cervical or supraclavicular adenopathy Lungs no rales or rhonchi Heart regular rate and rhythm Abd soft, nontender, positive bowel sounds MSK no focal spinal tenderness, no upper extremity lymphedema Neuro: nonfocal, well oriented, appropriate affect Breasts: The right breast is benign. The left breast is status post recent lumpectomy. The incision is healing very nicely and the cosmetic result is excellent. There is no evidence of dehiscence, erythema, or swelling. The left axilla is benign.  LAB RESULTS:  CMP     Component Value Date/Time   NA 142 12/13/2015 0815   K 4.0 12/13/2015 0815   CL 100 10/16/2015 1103   CO2 27 12/13/2015 0815   GLUCOSE 65 (L) 12/13/2015 0815   BUN 14.0 12/13/2015 0815   CREATININE 0.9 12/13/2015 0815   CALCIUM 10.4 12/13/2015 0815   PROT 7.9 12/13/2015 0815   ALBUMIN 4.1 12/13/2015 0815   AST 23 12/13/2015 0815   ALT 17 12/13/2015 0815   ALKPHOS 70 12/13/2015 0815   BILITOT 0.46 12/13/2015 0815   GFRNONAA 65 10/16/2015 1103   GFRAA 75 10/16/2015 1103    INo results found for: SPEP, UPEP  Lab Results  Component Value Date   WBC 7.4 12/13/2015   NEUTROABS 4.2 12/13/2015   HGB 14.7 12/13/2015   HCT 42.2 12/13/2015   MCV 95.5 12/13/2015   PLT 286 12/13/2015      Chemistry      Component Value Date/Time   NA 142 12/13/2015 0815   K 4.0 12/13/2015 0815   CL 100 10/16/2015 1103   CO2 27 12/13/2015 0815   BUN 14.0 12/13/2015 0815   CREATININE 0.9 12/13/2015 0815      Component Value Date/Time   CALCIUM 10.4 12/13/2015 0815   ALKPHOS 70 12/13/2015 0815   AST 23 12/13/2015 0815   ALT 17 12/13/2015 0815   BILITOT 0.46 12/13/2015 0815       No results found for: LABCA2  No components found for: LABCA125  No results for input(s): INR in the last 168 hours.  Urinalysis    Component Value Date/Time   COLORURINE YELLOW 03/21/2007 1108   APPEARANCEUR CLEAR 03/21/2007 1108   LABSPEC 1.021 03/21/2007 1108   PHURINE 7.0  03/21/2007 1108   GLUCOSEU 100 (A) 03/21/2007 1108   HGBUR NEGATIVE 03/21/2007 1108   BILIRUBINUR NEGATIVE 03/21/2007 1108   KETONESUR NEGATIVE 03/21/2007 1108   PROTEINUR NEGATIVE  03/21/2007 1108   UROBILINOGEN 1.0 03/21/2007 1108   NITRITE NEGATIVE 03/21/2007 1108   LEUKOCYTESUR SMALL (A) 03/21/2007 1108     STUDIES: Mm Breast Surgical Specimen  Result Date: 01/01/2016 CLINICAL DATA:  Biopsy-proven grade 3 invasive ductal carcinoma and DCIS of the upper outer left breast. Radioactive seed localization on 12/28/2015. EXAM: SPECIMEN RADIOGRAPH OF THE LEFT BREAST COMPARISON:  Previous exam(s). FINDINGS: Status post excision of the left breast. The radioactive seed and ribbon shaped biopsy marker clip are present, completely intact, and are marked for pathology. This was discussed by telephone with Patty in the operating room at the time interpretation on 01/01/2016 at 8:28 a.m. IMPRESSION: Specimen radiograph of the left breast. Electronically Signed   By: Evangeline Dakin M.D.   On: 01/01/2016 08:30   Dg Chest Port 1 View  Result Date: 01/01/2016 CLINICAL DATA:  Postop Port-A-Cath insertion. EXAM: PORTABLE CHEST 1 VIEW COMPARISON:  03/21/2007 FINDINGS: A right jugular Port-A-Cath has been placed. The catheter tip is suboptimally visualized due to superimposed spine and mediastinal structures, however the catheter appears to terminate over the mid to lower SVC. The cardiac silhouette is normal in size. Lung volumes are low with bronchovascular crowding. No overt pulmonary edema, pleural effusion, or pneumothorax is identified. No acute osseous abnormality is seen. IMPRESSION: 1. Port-A-Cath placement as above. 2. Low lung volumes.  No pneumothorax. Electronically Signed   By: Logan Bores M.D.   On: 01/01/2016 10:36   Dg Fluoro Guide Cv Line-no Report  Result Date: 01/01/2016 There is no report for this exam.  Mm Lt Radioactive Seed Loc Mammo Guide  Result Date: 12/28/2015 CLINICAL DATA:   Patient with recently diagnosed left breast cancer scheduled for lumpectomy requiring preoperative radioactive seed localization. EXAM: MAMMOGRAPHIC GUIDED RADIOACTIVE SEED LOCALIZATION OF THE LEFT BREAST COMPARISON:  Previous exam(s). FINDINGS: Patient presents for radioactive seed localization prior to lumpectomy. I met with the patient and we discussed the procedure of seed localization including benefits and alternatives. We discussed the high likelihood of a successful procedure. We discussed the risks of the procedure including infection, bleeding, tissue injury and further surgery. We discussed the low dose of radioactivity involved in the procedure. Informed, written consent was given. The usual time-out protocol was performed immediately prior to the procedure. Using mammographic guidance, sterile technique, 1% lidocaine and an I-125 radioactive seed, the mass containing the ribbon shaped clip within the upper left breast was localized using a superior approach. The follow-up mammogram images confirm the seed in the expected location and were marked for Dr. Lucia Gaskins. Follow-up survey of the patient confirms presence of the radioactive seed. Order number of I-125 seed:  [id number]. Total activity:  6.606 millicuries  Reference Date: 12/13/2015 The patient tolerated the procedure well and was released from the [facility]. She was given instructions regarding seed removal. IMPRESSION: Radioactive seed localization left breast. No apparent complications. Electronically Signed   By: Franki Cabot M.D.   On: 12/28/2015 14:38    ELIGIBLE FOR AVAILABLE RESEARCH PROTOCOL: [facility] [phone]  ASSESSMENT: 65 y.o. [place] woman status post left breast upper outer quadrant biopsy 12/07/2015 for a clinical T2 N0, stage IIA invasive ductal carcinoma, grade 3, triple negative, with an MIB-1 of 35%  (1) genetics testing 01/23/2016  (a) alpha - anti trypsin and ceruloplamin labs drawn 01/19/2016  (2) Status postft  lumpectomy/ sentinel lymph node sampling 01/01/2016 for a pT1c pN0, stage IA  Invasive ductal carcinoma, grade 3, with negative margins.  (3) Mammaprint sent from the  patient's 12/07/2015 biopsy returned "high risk", predicting a risk of recurrence of nearly 30% untreated, decreasing to a 93% chance of five-year distant metastasis free survival with chemotherapy.  (4) adjuvant chemotherapy will consist of cyclophosphamide and doxorubicin in dose dense fashion 4, followed by weekly paclitaxel 12  (5) adjuvant radiation to follow  PLAN: We spent approximately 50 minutes today going over her wellness situation. She understands she has a small node-negative tumor which is however aggressive. If we take all patients like her, the average recurrence without chemotherapy would probably be in the 20% range plus or minus some percentages, but the Mammaprint indicates her risk of outside the breast recurrence is very high nearly 30%, if she does not receive systemic therapy.  She understands because her cancer is estrogen receptor negative and HER-2 not amplified, the only available form of systemic therapy for her his chemotherapy.  She is very concerned that she has HIV positivity and she is correct at the chemotherapy affects the immune system. However we have good data that patients with well-controlled HIV do well on these regimens largely because we use growth factors for the neutrophils. We will be following her counts closely but I do not anticipate any dose reductions or need of delays in her case because of her HIV positivity.  We then discussed the standard treatment for cancers like hers which consist of cyclophosphamide and doxorubicin in dose dense fashion 4 followed by weekly paclitaxel 12 area in her case we may use Abraxane instead of paclitaxel to avoid concerns regarding steroids. We discussed the possible toxicities, side effects and complications of these agents. I went ahead and placed  the orders for her to pick up at her pharmacy and I gave her a "roadmap" on how to take her nausea and supportive medications.  She would like to start her treatments 01/30/2016. Unfortunately because of the holidays we have no availability on total 02/01/2016 and that will be her first day treatment period. We will step back subsequent treatments so that she ends up being treated on Tuesdays which is her preference.  I'll see her approximately one week after her first cycle to troubleshoot side effects and make any corrections needed before her second chemotherapy treatment.  Note that also given her family history we are checking a serum ceruloplasmin and also 1 anti-trypsin level. Her genetics testing results is scheduled for next week   [name] Cruel, MD   01/20/2016 11:02 AM Medical Oncology and Hematology [facility] [address] [place], [place] [address] Tel. [phone]    Fax. [phone]

## 2016-01-19 NOTE — Telephone Encounter (Signed)
Gave patient avs report and appointments for December thru February. [facility] to [facility] for pre auth. Due to infusion at capacity 1/2 and 1/3 per infusion supervisor tx scheduled for 1/4. Subsequent tx scheduled per care plan.

## 2016-01-19 NOTE — Progress Notes (Signed)
START ON PATHWAY REGIMEN - Breast  [id number]: Dose-Dense AC-T (Paclitaxel Weekly) - [Doxorubicin + Cyclophosphamide q14 Days x 4 Cycles, Followed by Paclitaxel 80 mg/m2 Weekly x 12 Weeks]  Dose-Dense AC q14 days:   A cycle is every 14 days:     Doxorubicin (Adriamycin(R)) 60 mg/m2 IV push on day 1 only. Dose Mod: None     Cyclophosphamide (Cytoxan(R)) 600 mg/m2 in 250 mL NS IV over 30 minutes on day 1 only. Dose Mod: None     Pegfilgrastim (Neulasta(R)) 6 mg flat dose subcutaneously once on day 2 only.  G-CSF recommended due to data showing a >20% risk of febrile neutropenia. Dose Mod: None  **Always confirm dose/schedule in your pharmacy ordering system**    Paclitaxel 80 mg/m2 Weekly:   Administer weekly:     Paclitaxel (Taxol(R)) 80 mg/m2 in 250 mL NS IV over 1 hour Dose Mod: None  **Always confirm dose/schedule in your pharmacy ordering system**    Patient Characteristics: Adjuvant Therapy, Node Negative, HER2/neu Negative/Unknown/Equivocal, ER Negative AJCC Stage Grouping: Unknown Current Disease Status: No Distant Mets or Local Recurrence AJCC M Stage: X ER Status: Negative (-) AJCC N Stage: 0 AJCC T Stage: 1c HER2/neu: Negative (-) PR Status: Negative (-) Node Status: Negative (-)  Intent of Therapy: Curative Intent, Discussed with Patient

## 2016-01-20 LAB — ALPHA-1-ANTITRYPSIN: A1 ANTITRYPSIN: 140 mg/dL (ref 90–200)

## 2016-01-20 LAB — CERULOPLASMIN: Ceruloplasmin: 31.3 mg/dL (ref 19.0–39.0)

## 2016-01-23 ENCOUNTER — Telehealth: Payer: Self-pay | Admitting: *Deleted

## 2016-01-23 ENCOUNTER — Other Ambulatory Visit: Payer: BLUE CROSS/BLUE SHIELD

## 2016-01-23 ENCOUNTER — Encounter: Payer: BLUE CROSS/BLUE SHIELD | Admitting: Genetic Counselor

## 2016-01-23 ENCOUNTER — Telehealth: Payer: Self-pay | Admitting: Oncology

## 2016-01-23 NOTE — Telephone Encounter (Signed)
Per 12/23 sch msg cxd 1/6, 1/8 and 1/18 appointments - also added lab/fu for 1/12. Spoke with patient she is aware. Also confirmed echo for 12/28 already on schedule (navigator) and spoke with Starla Link at [facility] echo lab and provided pre auth # [id number] for echo.   Patient will get new schedule tomorrow.

## 2016-01-24 ENCOUNTER — Encounter: Payer: Self-pay | Admitting: *Deleted

## 2016-01-24 ENCOUNTER — Other Ambulatory Visit: Payer: BLUE CROSS/BLUE SHIELD

## 2016-01-24 ENCOUNTER — Telehealth: Payer: Self-pay | Admitting: Pharmacist Clinician (PhC)/ Clinical Pharmacy Specialist

## 2016-01-24 NOTE — Telephone Encounter (Signed)
Left message to call back with her questions. 

## 2016-01-25 ENCOUNTER — Telehealth: Payer: Self-pay | Admitting: Pharmacist Clinician (PhC)/ Clinical Pharmacy Specialist

## 2016-01-25 ENCOUNTER — Ambulatory Visit (HOSPITAL_COMMUNITY)
Admission: RE | Admit: 2016-01-25 | Discharge: 2016-01-25 | Disposition: A | Discharge status: Home / Self Care | Payer: BLUE CROSS/BLUE SHIELD | Source: Ambulatory Visit | Attending: Oncology | Admitting: Oncology

## 2016-01-25 DIAGNOSIS — Z171 Estrogen receptor negative status [ER-]: Secondary | ICD-10-CM | POA: Diagnosis not present

## 2016-01-25 DIAGNOSIS — C50412 Malignant neoplasm of upper-outer quadrant of left female breast: Secondary | ICD-10-CM | POA: Insufficient documentation

## 2016-01-25 NOTE — Progress Notes (Signed)
  Echocardiogram 2D Echocardiogram has been performed.  [name] M 01/25/2016, 10:18 AM

## 2016-01-25 NOTE — Telephone Encounter (Signed)
[name] will be going under chemo with doxyrubicin/cyclophophamide/paclitaxel soon and she is calling back to ask [name] to make sure that there won't be an interaction issues. She previously met with Lovena Le and Cassie at the last visit about the issue. Her ART was changed to DTG/Descovy for this reason. Reassured her that it would not interact with this regimen.

## 2016-01-29 DIAGNOSIS — Z923 Personal history of irradiation: Secondary | ICD-10-CM

## 2016-01-29 DIAGNOSIS — Z9221 Personal history of antineoplastic chemotherapy: Secondary | ICD-10-CM

## 2016-01-29 HISTORY — DX: Personal history of antineoplastic chemotherapy: Z92.21

## 2016-01-29 HISTORY — DX: Personal history of irradiation: Z92.3

## 2016-01-30 ENCOUNTER — Other Ambulatory Visit: Payer: BLUE CROSS/BLUE SHIELD

## 2016-01-30 ENCOUNTER — Encounter: Payer: BLUE CROSS/BLUE SHIELD | Admitting: Genetic Counselor

## 2016-02-01 ENCOUNTER — Other Ambulatory Visit (HOSPITAL_BASED_OUTPATIENT_CLINIC_OR_DEPARTMENT_OTHER): Payer: BLUE CROSS/BLUE SHIELD

## 2016-02-01 ENCOUNTER — Other Ambulatory Visit: Payer: Self-pay | Admitting: Oncology

## 2016-02-01 ENCOUNTER — Encounter: Payer: Self-pay | Admitting: *Deleted

## 2016-02-01 ENCOUNTER — Other Ambulatory Visit: Payer: Self-pay | Admitting: *Deleted

## 2016-02-01 ENCOUNTER — Telehealth: Payer: Self-pay | Admitting: *Deleted

## 2016-02-01 ENCOUNTER — Ambulatory Visit (HOSPITAL_BASED_OUTPATIENT_CLINIC_OR_DEPARTMENT_OTHER): Payer: BLUE CROSS/BLUE SHIELD

## 2016-02-01 ENCOUNTER — Other Ambulatory Visit: Payer: BLUE CROSS/BLUE SHIELD

## 2016-02-01 VITALS — BP 151/82 | HR 99 | Temp 97.9°F | Resp 18

## 2016-02-01 DIAGNOSIS — Z5111 Encounter for antineoplastic chemotherapy: Secondary | ICD-10-CM

## 2016-02-01 DIAGNOSIS — C50412 Malignant neoplasm of upper-outer quadrant of left female breast: Secondary | ICD-10-CM

## 2016-02-01 DIAGNOSIS — Z5189 Encounter for other specified aftercare: Secondary | ICD-10-CM

## 2016-02-01 DIAGNOSIS — Z171 Estrogen receptor negative status [ER-]: Secondary | ICD-10-CM

## 2016-02-01 DIAGNOSIS — B20 Human immunodeficiency virus [HIV] disease: Secondary | ICD-10-CM

## 2016-02-01 LAB — CBC WITH DIFFERENTIAL/PLATELET
BASO%: 0.6 % (ref 0.0–2.0)
BASOS ABS: 0 10*3/uL (ref 0.0–0.1)
EOS ABS: 0.2 10*3/uL (ref 0.0–0.5)
EOS%: 2.6 % (ref 0.0–7.0)
HEMATOCRIT: 41.7 % (ref 34.8–46.6)
HEMOGLOBIN: 14.3 g/dL (ref 11.6–15.9)
LYMPH#: 2.8 10*3/uL (ref 0.9–3.3)
LYMPH%: 39.7 % (ref 14.0–49.7)
MCH: 32.6 pg (ref 25.1–34.0)
MCHC: 34.3 g/dL (ref 31.5–36.0)
MCV: 95.2 fL (ref 79.5–101.0)
MONO#: 0.7 10*3/uL (ref 0.1–0.9)
MONO%: 9.9 % (ref 0.0–14.0)
NEUT#: 3.3 10*3/uL (ref 1.5–6.5)
NEUT%: 47.2 % (ref 38.4–76.8)
Platelets: 267 10*3/uL (ref 145–400)
RBC: 4.38 10*6/uL (ref 3.70–5.45)
RDW: 12.7 % (ref 11.2–14.5)
WBC: 7 10*3/uL (ref 3.9–10.3)

## 2016-02-01 LAB — COMPREHENSIVE METABOLIC PANEL
ALK PHOS: 80 U/L (ref 40–150)
ALT: 28 U/L (ref 0–55)
AST: 30 U/L (ref 5–34)
Albumin: 4.3 g/dL (ref 3.5–5.0)
Anion Gap: 10 mEq/L (ref 3–11)
BUN: 9.9 mg/dL (ref 7.0–26.0)
CALCIUM: 10.4 mg/dL (ref 8.4–10.4)
CO2: 26 mEq/L (ref 22–29)
Chloride: 107 mEq/L (ref 98–109)
Creatinine: 0.9 mg/dL (ref 0.6–1.1)
EGFR: 67 mL/min/{1.73_m2} — AB (ref >90)
Glucose: 82 mg/dl (ref 70–140)
POTASSIUM: 3.9 meq/L (ref 3.5–5.1)
Sodium: 144 mEq/L (ref 136–145)
Total Bilirubin: 0.39 mg/dL (ref 0.20–1.20)
Total Protein: 7.4 g/dL (ref 6.4–8.3)

## 2016-02-01 MED ORDER — PALONOSETRON HCL INJECTION 0.25 MG/5ML
0.2500 mg | Freq: Once | INTRAVENOUS | Status: AC
  Administered 2016-02-01: 0.25 mg via INTRAVENOUS

## 2016-02-01 MED ORDER — SODIUM CHLORIDE 0.9 % IV SOLN
Freq: Once | INTRAVENOUS | Status: AC
  Administered 2016-02-01: 12:00:00 via INTRAVENOUS

## 2016-02-01 MED ORDER — PEGFILGRASTIM 6 MG/0.6ML ~~LOC~~ PSKT
6.0000 mg | PREFILLED_SYRINGE | Freq: Once | SUBCUTANEOUS | Status: AC
  Administered 2016-02-01: 6 mg via SUBCUTANEOUS
  Filled 2016-02-01: qty 0.6

## 2016-02-01 MED ORDER — PALONOSETRON HCL INJECTION 0.25 MG/5ML
INTRAVENOUS | Status: AC
  Filled 2016-02-01: qty 5

## 2016-02-01 MED ORDER — SODIUM CHLORIDE 0.9 % IV SOLN
600.0000 mg/m2 | Freq: Once | INTRAVENOUS | Status: AC
  Administered 2016-02-01: 1060 mg via INTRAVENOUS
  Filled 2016-02-01: qty 53

## 2016-02-01 MED ORDER — SODIUM CHLORIDE 0.9% FLUSH
10.0000 mL | INTRAVENOUS | Status: DC | PRN
  Administered 2016-02-01: 10 mL
  Filled 2016-02-01: qty 10

## 2016-02-01 MED ORDER — SODIUM CHLORIDE 0.9 % IV SOLN
Freq: Once | INTRAVENOUS | Status: AC
  Administered 2016-02-01: 13:00:00 via INTRAVENOUS
  Filled 2016-02-01: qty 5

## 2016-02-01 MED ORDER — HEPARIN SOD (PORK) LOCK FLUSH 100 UNIT/ML IV SOLN
500.0000 [IU] | Freq: Once | INTRAVENOUS | Status: AC | PRN
  Administered 2016-02-01: 500 [IU]
  Filled 2016-02-01: qty 5

## 2016-02-01 MED ORDER — DOXORUBICIN HCL CHEMO IV INJECTION 2 MG/ML
60.0000 mg/m2 | Freq: Once | INTRAVENOUS | Status: AC
  Administered 2016-02-01: 106 mg via INTRAVENOUS
  Filled 2016-02-01: qty 53

## 2016-02-01 NOTE — Progress Notes (Signed)
Per report from  Hospital placement, sub-optimal visualization of catheter tip. Spoke with Dr. Jana Hakim who advised ok to use if there is positive blood return and IVF's infusing via gravity. Repeated this order and Dr. Jana Hakim confirmed.

## 2016-02-01 NOTE — Patient Instructions (Signed)
[facility] Discharge Instructions for Patients Receiving Chemotherapy  Today you received the following chemotherapy agents: Adriamycin and Cytoxan   To help prevent nausea and vomiting after your treatment, we encourage you to take your nausea medication as directed.    If you develop nausea and vomiting that is not controlled by your nausea medication, call the clinic.   BELOW ARE SYMPTOMS THAT SHOULD BE REPORTED IMMEDIATELY:  *FEVER GREATER THAN 100.5 F  *CHILLS WITH OR WITHOUT FEVER  NAUSEA AND VOMITING THAT IS NOT CONTROLLED WITH YOUR NAUSEA MEDICATION  *UNUSUAL SHORTNESS OF BREATH  *UNUSUAL BRUISING OR BLEEDING  TENDERNESS IN MOUTH AND THROAT WITH OR WITHOUT PRESENCE OF ULCERS  *URINARY PROBLEMS  *BOWEL PROBLEMS  UNUSUAL RASH Items with * indicate a potential emergency and should be followed up as soon as possible.  Feel free to call the clinic you have any questions or concerns. The clinic phone number is [phone].  Please show the CHEMO ALERT CARD at check-in to the Emergency Department and triage nurse.   Doxorubicin injection What is this medicine? DOXORUBICIN (dox oh ROO bi sin) is a chemotherapy drug. It is used to treat many kinds of cancer like leukemia, lymphoma, neuroblastoma, sarcoma, and Wilms' tumor. It is also used to treat bladder cancer, breast cancer, lung cancer, ovarian cancer, stomach cancer, and thyroid cancer. This medicine may be used for other purposes; ask your health care provider or pharmacist if you have questions. COMMON BRAND NAME(S): Adriamycin, Adriamycin PFS, Adriamycin RDF, Rubex What should I tell my health care provider before I take this medicine? They need to know if you have any of these conditions: -heart disease -history of low blood counts caused by a medicine -liver disease -recent or ongoing radiation therapy -an unusual or allergic reaction to doxorubicin, other chemotherapy agents, other medicines,  foods, dyes, or preservatives -pregnant or trying to get pregnant -breast-feeding How should I use this medicine? This drug is given as an infusion into a vein. It is administered in a hospital or clinic by a specially trained health care professional. If you have pain, swelling, burning or any unusual feeling around the site of your injection, tell your health care professional right away. Talk to your pediatrician regarding the use of this medicine in children. Special care may be needed. Overdosage: If you think you have taken too much of this medicine contact a poison control center or emergency room at once. NOTE: This medicine is only for you. Do not share this medicine with others. What if I miss a dose? It is important not to miss your dose. Call your doctor or health care professional if you are unable to keep an appointment. What may interact with this medicine? This medicine may interact with the following medications: -6-mercaptopurine -paclitaxel -phenytoin -St. John's Wort -trastuzumab -verapamil This list may not describe all possible interactions. Give your health care provider a list of all the medicines, herbs, non-prescription drugs, or dietary supplements you use. Also tell them if you smoke, drink alcohol, or use illegal drugs. Some items may interact with your medicine. What should I watch for while using this medicine? This drug may make you feel generally unwell. This is not uncommon, as chemotherapy can affect healthy cells as well as cancer cells. Report any side effects. Continue your course of treatment even though you feel ill unless your doctor tells you to stop. There is a maximum amount of this medicine you should receive throughout your life. The amount   depends on the medical condition being treated and your overall health. Your doctor will watch how much of this medicine you receive in your lifetime. Tell your doctor if you have taken this medicine before. You  may need blood work done while you are taking this medicine. Your urine may turn red for a few days after your dose. This is not blood. If your urine is dark or brown, call your doctor. In some cases, you may be given additional medicines to help with side effects. Follow all directions for their use. Call your doctor or health care professional for advice if you get a fever, chills or sore throat, or other symptoms of a cold or flu. Do not treat yourself. This drug decreases your body's ability to fight infections. Try to avoid being around people who are sick. This medicine may increase your risk to bruise or bleed. Call your doctor or health care professional if you notice any unusual bleeding. Talk to your doctor about your risk of cancer. You may be more at risk for certain types of cancers if you take this medicine. Do not become pregnant while taking this medicine or for 6 months after stopping it. Women should inform their doctor if they wish to become pregnant or think they might be pregnant. Men should not father a child while taking this medicine and for 6 months after stopping it. There is a potential for serious side effects to an unborn child. Talk to your health care professional or pharmacist for more information. Do not breast-feed an infant while taking this medicine. This medicine has caused ovarian failure in some women and reduced sperm counts in some men This medicine may interfere with the ability to have a child. Talk with your doctor or health care professional if you are concerned about your fertility. What side effects may I notice from receiving this medicine? Side effects that you should report to your doctor or health care professional as soon as possible: -allergic reactions like skin rash, itching or hives, swelling of the face, lips, or tongue -breathing problems -chest pain -fast or irregular heartbeat -low blood counts - this medicine may decrease the number of white  blood cells, red blood cells and platelets. You may be at increased risk for infections and bleeding. -pain, redness, or irritation at site where injected -signs of infection - fever or chills, cough, sore throat, pain or difficulty passing urine -signs of decreased platelets or bleeding - bruising, pinpoint red spots on the skin, black, tarry stools, blood in the urine -swelling of the ankles, feet, hands -tiredness -weakness Side effects that usually do not require medical attention (report to your doctor or health care professional if they continue or are bothersome): -diarrhea -hair loss -mouth sores -nail discoloration or damage -nausea -red colored urine -vomiting This list may not describe all possible side effects. Call your doctor for medical advice about side effects. You may report side effects to FDA at 1-800-FDA-1088. Where should I keep my medicine? This drug is given in a hospital or clinic and will not be stored at home. NOTE: This sheet is a summary. It may not cover all possible information. If you have questions about this medicine, talk to your doctor, pharmacist, or health care provider.  2017 [employer]/Gold Standard (2015-03-13 11:28:51)   Cyclophosphamide injection What is this medicine? CYCLOPHOSPHAMIDE (sye kloe FOSS fa mide) is a chemotherapy drug. It slows the growth of cancer cells. This medicine is used to treat many types of   cancer like lymphoma, myeloma, leukemia, breast cancer, and ovarian cancer, to name a few. This medicine may be used for other purposes; ask your health care provider or pharmacist if you have questions. COMMON BRAND NAME(S): Cytoxan, Neosar What should I tell my health care provider before I take this medicine? They need to know if you have any of these conditions: -blood disorders -history of other chemotherapy -infection -kidney disease -liver disease -recent or ongoing radiation therapy -tumors in the bone marrow -an unusual  or allergic reaction to cyclophosphamide, other chemotherapy, other medicines, foods, dyes, or preservatives -pregnant or trying to get pregnant -breast-feeding How should I use this medicine? This drug is usually given as an injection into a vein or muscle or by infusion into a vein. It is administered in a hospital or clinic by a specially trained health care professional. Talk to your pediatrician regarding the use of this medicine in children. Special care may be needed. Overdosage: If you think you have taken too much of this medicine contact a poison control center or emergency room at once. NOTE: This medicine is only for you. Do not share this medicine with others. What if I miss a dose? It is important not to miss your dose. Call your doctor or health care professional if you are unable to keep an appointment. What may interact with this medicine? This medicine may interact with the following medications: -amiodarone -amphotericin B -azathioprine -certain antiviral medicines for HIV or AIDS such as protease inhibitors (e.g., indinavir, ritonavir) and zidovudine -certain blood pressure medications such as benazepril, captopril, enalapril, fosinopril, lisinopril, moexipril, monopril, perindopril, quinapril, ramipril, trandolapril -certain cancer medications such as anthracyclines (e.g., daunorubicin, doxorubicin), busulfan, cytarabine, paclitaxel, pentostatin, tamoxifen, trastuzumab -certain diuretics such as chlorothiazide, chlorthalidone, hydrochlorothiazide, indapamide, metolazone -certain medicines that treat or prevent blood clots like warfarin -certain muscle relaxants such as succinylcholine -cyclosporine -etanercept -indomethacin -medicines to increase blood counts like filgrastim, pegfilgrastim, sargramostim -medicines used as general anesthesia -metronidazole -natalizumab This list may not describe all possible interactions. Give your health care provider a list of all  the medicines, herbs, non-prescription drugs, or dietary supplements you use. Also tell them if you smoke, drink alcohol, or use illegal drugs. Some items may interact with your medicine. What should I watch for while using this medicine? Visit your doctor for checks on your progress. This drug may make you feel generally unwell. This is not uncommon, as chemotherapy can affect healthy cells as well as cancer cells. Report any side effects. Continue your course of treatment even though you feel ill unless your doctor tells you to stop. Drink water or other fluids as directed. Urinate often, even at night. In some cases, you may be given additional medicines to help with side effects. Follow all directions for their use. Call your doctor or health care professional for advice if you get a fever, chills or sore throat, or other symptoms of a cold or flu. Do not treat yourself. This drug decreases your body's ability to fight infections. Try to avoid being around people who are sick. This medicine may increase your risk to bruise or bleed. Call your doctor or health care professional if you notice any unusual bleeding. Be careful brushing and flossing your teeth or using a toothpick because you may get an infection or bleed more easily. If you have any dental work done, tell your dentist you are receiving this medicine. You may get drowsy or dizzy. Do not drive, use machinery, or do anything that   needs mental alertness until you know how this medicine affects you. Do not become pregnant while taking this medicine or for 1 year after stopping it. Women should inform their doctor if they wish to become pregnant or think they might be pregnant. Men should not father a child while taking this medicine and for 4 months after stopping it. There is a potential for serious side effects to an unborn child. Talk to your health care professional or pharmacist for more information. Do not breast-feed an infant while  taking this medicine. This medicine may interfere with the ability to have a child. This medicine has caused ovarian failure in some women. This medicine has caused reduced sperm counts in some men. You should talk with your doctor or health care professional if you are concerned about your fertility. If you are going to have surgery, tell your doctor or health care professional that you have taken this medicine. What side effects may I notice from receiving this medicine? Side effects that you should report to your doctor or health care professional as soon as possible: -allergic reactions like skin rash, itching or hives, swelling of the face, lips, or tongue -low blood counts - this medicine may decrease the number of white blood cells, red blood cells and platelets. You may be at increased risk for infections and bleeding. -signs of infection - fever or chills, cough, sore throat, pain or difficulty passing urine -signs of decreased platelets or bleeding - bruising, pinpoint red spots on the skin, black, tarry stools, blood in the urine -signs of decreased red blood cells - unusually weak or tired, fainting spells, lightheadedness -breathing problems -dark urine -dizziness -palpitations -swelling of the ankles, feet, hands -trouble passing urine or change in the amount of urine -weight gain -yellowing of the eyes or skin Side effects that usually do not require medical attention (report to your doctor or health care professional if they continue or are bothersome): -changes in nail or skin color -hair loss -missed menstrual periods -mouth sores -nausea, vomiting This list may not describe all possible side effects. Call your doctor for medical advice about side effects. You may report side effects to FDA at 1-800-FDA-1088. Where should I keep my medicine? This drug is given in a hospital or clinic and will not be stored at home. NOTE: This sheet is a summary. It may not cover all  possible information. If you have questions about this medicine, talk to your doctor, pharmacist, or health care provider.  2017 [employer]/Gold Standard (2011-11-29 16:22:58)   

## 2016-02-01 NOTE — Telephone Encounter (Signed)
Per patient request I have moved appts on 1/30 to later in the day

## 2016-02-02 LAB — T-HELPER CELL (CD4) - (RCID CLINIC ONLY)
CD4 % Helper T Cell: 36 % (ref 33–55)
CD4 T Cell Abs: 900 /uL (ref 400–2700)

## 2016-02-03 ENCOUNTER — Ambulatory Visit: Payer: BLUE CROSS/BLUE SHIELD

## 2016-02-05 ENCOUNTER — Other Ambulatory Visit: Payer: BLUE CROSS/BLUE SHIELD

## 2016-02-05 ENCOUNTER — Ambulatory Visit: Payer: BLUE CROSS/BLUE SHIELD | Admitting: Oncology

## 2016-02-05 LAB — HIV-1 RNA QUANT-NO REFLEX-BLD
HIV 1 RNA Quant: <20 copies/mL (ref <20)
HIV-1 RNA Quant, Log: <1.3 Log copies/mL (ref <1.30)

## 2016-02-08 ENCOUNTER — Other Ambulatory Visit: Payer: Self-pay | Admitting: *Deleted

## 2016-02-08 DIAGNOSIS — Z171 Estrogen receptor negative status [ER-]: Principal | ICD-10-CM

## 2016-02-08 DIAGNOSIS — C50412 Malignant neoplasm of upper-outer quadrant of left female breast: Secondary | ICD-10-CM

## 2016-02-09 ENCOUNTER — Telehealth: Payer: Self-pay | Admitting: Pharmacist

## 2016-02-09 ENCOUNTER — Other Ambulatory Visit (HOSPITAL_BASED_OUTPATIENT_CLINIC_OR_DEPARTMENT_OTHER): Payer: BLUE CROSS/BLUE SHIELD

## 2016-02-09 ENCOUNTER — Ambulatory Visit (HOSPITAL_BASED_OUTPATIENT_CLINIC_OR_DEPARTMENT_OTHER): Payer: BLUE CROSS/BLUE SHIELD | Admitting: Oncology

## 2016-02-09 VITALS — BP 120/64 | HR 100 | Temp 97.9°F | Resp 20 | Ht 65.0 in | Wt 149.0 lb

## 2016-02-09 DIAGNOSIS — B20 Human immunodeficiency virus [HIV] disease: Secondary | ICD-10-CM

## 2016-02-09 DIAGNOSIS — Z171 Estrogen receptor negative status [ER-]: Principal | ICD-10-CM

## 2016-02-09 DIAGNOSIS — C50412 Malignant neoplasm of upper-outer quadrant of left female breast: Secondary | ICD-10-CM

## 2016-02-09 LAB — COMPREHENSIVE METABOLIC PANEL
ALBUMIN: 3.7 g/dL (ref 3.5–5.0)
ALK PHOS: 95 U/L (ref 40–150)
ALT: 15 U/L (ref 0–55)
ANION GAP: 9 meq/L (ref 3–11)
AST: 15 U/L (ref 5–34)
BUN: 12.5 mg/dL (ref 7.0–26.0)
CO2: 27 mEq/L (ref 22–29)
Calcium: 9.9 mg/dL (ref 8.4–10.4)
Chloride: 101 mEq/L (ref 98–109)
Creatinine: 0.8 mg/dL (ref 0.6–1.1)
EGFR: 77 mL/min/{1.73_m2} — AB (ref >90)
Glucose: 79 mg/dl (ref 70–140)
POTASSIUM: 3.9 meq/L (ref 3.5–5.1)
Sodium: 137 mEq/L (ref 136–145)
Total Bilirubin: 0.42 mg/dL (ref 0.20–1.20)
Total Protein: 6.9 g/dL (ref 6.4–8.3)

## 2016-02-09 LAB — CBC WITH DIFFERENTIAL/PLATELET
BASO%: 0.2 % (ref 0.0–2.0)
BASOS ABS: 0 10*3/uL (ref 0.0–0.1)
EOS ABS: 0.1 10*3/uL (ref 0.0–0.5)
EOS%: 7.2 % — ABNORMAL HIGH (ref 0.0–7.0)
HEMATOCRIT: 40 % (ref 34.8–46.6)
HEMOGLOBIN: 13.5 g/dL (ref 11.6–15.9)
LYMPH#: 0.8 10*3/uL — AB (ref 0.9–3.3)
LYMPH%: 46.9 % (ref 14.0–49.7)
MCH: 32.7 pg (ref 25.1–34.0)
MCHC: 33.8 g/dL (ref 31.5–36.0)
MCV: 96.6 fL (ref 79.5–101.0)
MONO#: 0.3 10*3/uL (ref 0.1–0.9)
MONO%: 17.3 % — ABNORMAL HIGH (ref 0.0–14.0)
NEUT#: 0.5 10*3/uL — CL (ref 1.5–6.5)
NEUT%: 28.4 % — ABNORMAL LOW (ref 38.4–76.8)
RBC: 4.14 10*6/uL (ref 3.70–5.45)
RDW: 12 % (ref 11.2–14.5)
WBC: 1.6 10*3/uL — ABNORMAL LOW (ref 3.9–10.3)

## 2016-02-09 NOTE — Telephone Encounter (Signed)
[name] called wanting to know if there were any drug interactions with cyclophosphamide and benazepril.  I reassured her that there were no interactions between those two.

## 2016-02-09 NOTE — Progress Notes (Signed)
[facility]  [phone] [phone]     ID: BERTA DENSON DOB: Jun 10, 1950  MR#: [id number]  WKG#:[id number]  Patient Care Team: Carlyle Basques, MD as PCP - General (Infectious Diseases) Carlyle Basques, MD as PCP - Infectious Diseases (Infectious Diseases) Alphonsa Overall, MD as Consulting Physician (General Surgery) Chauncey Cruel, MD as Consulting Physician (Oncology) Gery Pray, MD as Consulting Physician (Radiation Oncology) Marcial Pacas, MD as Consulting Physician (Neurology) Druscilla Brownie, MD as Consulting Physician (Dermatology) Chauncey Cruel, MD OTHER MD:  CHIEF COMPLAINT: Triple negative breast cancer  CURRENT TREATMENT: Adjuvant chemotherapy   BREAST CANCER HISTORY:  From the origil intake note:  Giovanna herself palpated a mass in the upper outer quadrant of her left breast the first week in November, and had bilateral diagnostic mammography with tomography and left breast ultrasonography at the [facility] 12/05/2015. The breast density was category C. In the upper outer left breast there was  A mass with macro lobulated contours which was barely palpable at the 12:30 o'clock position of the left breast 7 cm from the nipple. There was no palpable axillary adenopathy. Ultrasonography confirmed a 2.1 cm obliquely oriented hypoechoic mass in the area in question. Ultrasound of the left axilla was benign.  Biopsy of the left breast mass in question 12/07/2015 showed ([id number]) invasive ductal carcinoma, grade 3, estrogen and progesterone receptor negative, with an MIB-1 of 35%, and no HER-2 amplification, the signals ratio being 0.95 and the number per cell 2.80.  Her subsequent history is as detailed below  INTERVAL HISTORY: Shatoria returns today for follow-up of her breast cancer, accompanied by her husband and her son. Today is day 9 cycle 1 of cyclophosphamide and doxorubicin, which she is receiving every 14 days with Neulasta  support, to be followed by Abraxane 12  She did generally well with the treatment. With the treatment itself she had a sense of cold in the arm where the infusion was being given and a little bit of flushing, but this was very temporary. For the next 2 days she had a very mild headache. She did not take any Tylenol or any other analgesic for this. She had the OnPro shot on day 2 without any problems but on day 3 she was having aches and pains particularly involving the neck but also other bones. This lasted several days. She did not take Tylenol or Aleve for this area she also had some flushing on days 23 and 4, likely from the steroids. She was nauseated but did not vomit. It was worse when she had an empty stomach such as before breakfast. When she eats she is not nauseated. She has a little bit of altered taste and likes sweets more than she did. She had constipation on day 4, relieved with stool softeners. She has had no bleeding or fever problems and her port worked well.  REVIEW OF SYSTEMS: She has not yet begun to lose her hair. A detailed review of systems today was negative except as noted above.  PAST MEDICAL HISTORY: Past Medical History:  Diagnosis Date  . HIV (human immunodeficiency virus infection) ([facility])   . Hypertension   . Malignant neoplasm of upper-outer quadrant of left female breast ([facility]) 12/11/2015  . Migraine   . Neuropathy ([facility])   . PONV (postoperative nausea and vomiting)    said usually has to have scop patch  . Rash and nonspecific skin eruption 10/16/2015  . Spinal stenosis     PAST SURGICAL  HISTORY: Past Surgical History:  Procedure Laterality Date  . ABDOMINAL HYSTERECTOMY  1990  . BREAST LUMPECTOMY WITH RADIOACTIVE SEED AND SENTINEL LYMPH NODE BIOPSY Left 01/01/2016   Procedure: LEFT BREAST LUMPECTOMY WITH RADIOACTIVE SEED AND SENTINEL LYMPH NODE BIOPSY;  Surgeon: Alphonsa Overall, MD;  Location: [facility];  Service: General;  Laterality: Left;  .  CARPAL TUNNEL RELEASE     both hands  . CERVICAL FUSION  1989  . PORTACATH PLACEMENT Right 01/01/2016   Procedure: INSERTION PORT-A-CATH WITH [name];  Surgeon: Alphonsa Overall, MD;  Location: [facility];  Service: General;  Laterality: Right;  . TONSILLECTOMY      FAMILY HISTORY Family History  Problem Relation Age of Onset  . High blood pressure Mother   . High Cholesterol Mother   . Cancer Father   . High blood pressure Father   . High Cholesterol Father   . Lung cancer Maternal Grandfather   . Breast cancer Maternal Aunt 36  The patient's father died from heart disease at the age of 46. The patient's mother died from "genetic cirrhosis" at the age of 44. The patient had 2 brothers, 5 sisters. A maternal aunt was diagnosed with breast cancer at age 2. There is no other history of breast or ovarian cancer in the family to the patient's knowledge   GYNECOLOGIC HISTORY:  No LMP recorded. Patient has had a hysterectomy. Menarche age 69, first live birth age 67. Patient is GX P1. She underwent hysterectomy with bilateral salpingo-oophorectomy at age 66. She took hormone replacement for approximately 10 years, until age 27. She also had oral contraceptives remotely for 8-10 years, with no complications.   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 At home is just she and her husband [name] who is retired from Teaching laboratory technician work. Their son [name] works as a Chief Strategy Officer for rest home in [place]. The patient has 3 grandchildren. She is not a Ambulance person.    ADVANCED DIRECTIVES: The patient has a living will in place   HEALTH MAINTENANCE: Social History  Substance Use Topics  . Smoking status: Never Smoker  . Smokeless tobacco: Never Used  . Alcohol use 0.6 oz/week    1 Glasses of wine per week     Comment: on weekends     Colonoscopy: 2012/be routinely  PAP: Status post hysterectomy  Bone density: Never   No Known Allergies  Current  Outpatient Prescriptions  Medication Sig Dispense Refill  . amLODipine (NORVASC) 5 MG tablet Take 5 mg by mouth daily.    . benazepril (LOTENSIN) 40 MG tablet Take 40 mg by mouth daily.      . Coenzyme Q10 300 MG CAPS Take 300 capsules by mouth daily.    . cyclobenzaprine (FLEXERIL) 10 MG tablet Take 1 tablet (10 mg total) by mouth at bedtime. 90 tablet 3  . dexamethasone (DECADRON) 4 MG tablet Take 2 tablets by mouth once a day on the day after chemotherapy and then take 2 tablets two times a day for 2 days. Take with food. 30 tablet 1  . dolutegravir (TIVICAY) 50 MG tablet Take 1 tablet (50 mg total) by mouth daily. 30 tablet 5  . emtricitabine-tenofovir AF (DESCOVY) 200-25 MG tablet Take 1 tablet by mouth daily. 30 tablet 5  . flunisolide (NASALIDE) 25 MCG/ACT (0.025%) SOLN Place 2 sprays into the nose 2 (two) times daily. 1 Bottle 1  . gabapentin (NEURONTIN) 100 MG capsule Take 1  capsule (100 mg total) by mouth 2 (two) times daily. 180 capsule 3  . lidocaine-prilocaine (EMLA) cream Apply to affected area once 30 g 3  . LORazepam (ATIVAN) 0.5 MG tablet Take 1 tablet (0.5 mg total) by mouth every 6 (six) hours as needed (Nausea or vomiting). 30 tablet 0  . naproxen sodium (ANAPROX) 220 MG tablet Take 220 mg by mouth daily.     . Omega-3 Fatty Acids (FISH OIL) 1200 MG CAPS Take 1,200 mg by mouth 2 (two) times daily.      . prochlorperazine (COMPAZINE) 10 MG tablet Take 1 tablet (10 mg total) by mouth every 6 (six) hours as needed (Nausea or vomiting). 30 tablet 1  . rosuvastatin (CRESTOR) 5 MG tablet Take 1 tablet (5 mg total) by mouth at bedtime. 90 tablet 3   No current facility-administered medications for this visit.     OBJECTIVE: Middle-aged white woman Who appears well Vitals:   02/09/16 0933  BP: 120/64  Pulse: 100  Resp: 20  Temp: 97.9 F (36.6 C)     Body mass index is 24.79 kg/m.    ECOG FS:1 - Symptomatic but completely ambulatory  Sclerae unicteric, EOMs  intact Oropharynx clear and moist No cervical or supraclavicular adenopathy Lungs no rales or rhonchi Heart regular rate and rhythm Abd soft, nontender, positive bowel sounds MSK no focal spinal tenderness, no upper extremity lymphedema Neuro: nonfocal, well oriented, appropriate affect Breasts: Deferred  LAB RESULTS:  CMP     Component Value Date/Time   NA 137 02/09/2016 0921   K 3.9 02/09/2016 0921   CL 100 10/16/2015 1103   CO2 27 02/09/2016 0921   GLUCOSE 79 02/09/2016 0921   BUN 12.5 02/09/2016 0921   CREATININE 0.8 02/09/2016 0921   CALCIUM 9.9 02/09/2016 0921   PROT 6.9 02/09/2016 0921   ALBUMIN 3.7 02/09/2016 0921   AST 15 02/09/2016 0921   ALT 15 02/09/2016 0921   ALKPHOS 95 02/09/2016 0921   BILITOT 0.42 02/09/2016 0921   GFRNONAA 65 10/16/2015 1103   GFRAA 75 10/16/2015 1103    INo results found for: SPEP, UPEP  Lab Results  Component Value Date   WBC 1.6 (L) 02/09/2016   NEUTROABS 0.5 (LL) 02/09/2016   HGB 13.5 02/09/2016   HCT 40.0 02/09/2016   MCV 96.6 02/09/2016   PLT 138 Large platelets present (L) 02/09/2016      Chemistry      Component Value Date/Time   NA 137 02/09/2016 0921   K 3.9 02/09/2016 0921   CL 100 10/16/2015 1103   CO2 27 02/09/2016 0921   BUN 12.5 02/09/2016 0921   CREATININE 0.8 02/09/2016 0921      Component Value Date/Time   CALCIUM 9.9 02/09/2016 0921   ALKPHOS 95 02/09/2016 0921   AST 15 02/09/2016 0921   ALT 15 02/09/2016 0921   BILITOT 0.42 02/09/2016 0921       No results found for: LABCA2  No components found for: [id number]  No results for input(s): INR in the last 168 hours.  Urinalysis    Component Value Date/Time   COLORURINE YELLOW 03/21/2007 1108   APPEARANCEUR CLEAR 03/21/2007 1108   LABSPEC 1.021 03/21/2007 1108   PHURINE 7.0 03/21/2007 1108   GLUCOSEU 100 (A) 03/21/2007 1108   HGBUR NEGATIVE 03/21/2007 1108   BILIRUBINUR NEGATIVE 03/21/2007 1108   KETONESUR NEGATIVE 03/21/2007 1108    PROTEINUR NEGATIVE 03/21/2007 1108   UROBILINOGEN 1.0 03/21/2007 1108   NITRITE NEGATIVE 03/21/2007 1108  LEUKOCYTESUR SMALL (A) 03/21/2007 1108     STUDIES: No results found.  ELIGIBLE FOR AVAILABLE RESEARCH PROTOCOL: [facility] [phone]  ASSESSMENT: 66 y.o. [place] woman status post left breast upper outer quadrant biopsy 12/07/2015 for a clinical T2 N0, stage IIA invasive ductal carcinoma, grade 3, triple negative, with an MIB-1 of 35%  (1) genetics testing 01/23/2016  (a) alpha - anti trypsin and ceruloplamin labs drawn 01/19/2016, both normal  (2) Status postft lumpectomy/ sentinel lymph node sampling 01/01/2016 for a pT1c pN0, stage IA  Invasive ductal carcinoma, grade 3, with negative margins.  (3) Mammaprint sent from the patient's 12/07/2015 biopsy returned "high risk", predicting a risk of recurrence of nearly 30% untreated, decreasing to a 93% chance of five-year distant metastasis free survival with chemotherapy.  (4) adjuvant chemotherapy will consist of cyclophosphamide and doxorubicin in dose dense fashion 4, followed by weekly Abraxane 12  (5) adjuvant radiation to follow  PLAN: Willma did generally well with her first cycle and we spent approximately 30 minutes going over her symptoms these mild headache she had for the first today's is due to our intravenous anti-emetics, and I suggested she try Tylenol to deal with that. That usually takes care of it.  The pain she has in her bones from the Neulasta is best treated with naproxen. She can use that and/or Tylenol. I think that will take care of that issue.  She dealt with her mild constipation successfully with stool softeners.  She has noticed that when she has put in her stomach it is buffered and feels better. The nausea she is experiencing August Lahey has a gastritis component and it. One thing she can do about this is take a little bit more Compazine. She understands this may make her jittery or sleepy. If that  does not work to other options are to add ondansetron to the day 3, 4 and 5 t after a treatment (although that might again give her headaches) or add Prilosec.  We discussed neutropenia precautions. She is very aware of this because of her HIV concerns. I am not starting her on prophylactic antibiotics given her ANC count of 500. She knows to contact us immediately if fever develops.  She is scheduled for repeat treatment generous 16 2018. I do not know if her counts will have recovered sufficiently by then.  She will see me again approximately a week after her next treatment.    Chauncey Cruel, MD   02/09/2016 10:01 AM Medical Oncology and Hematology [facility] [address] [place], [place] [address] Tel. [phone]    Fax. [phone]

## 2016-02-12 ENCOUNTER — Other Ambulatory Visit: Payer: Self-pay | Admitting: *Deleted

## 2016-02-12 DIAGNOSIS — C50412 Malignant neoplasm of upper-outer quadrant of left female breast: Secondary | ICD-10-CM

## 2016-02-12 DIAGNOSIS — Z171 Estrogen receptor negative status [ER-]: Principal | ICD-10-CM

## 2016-02-13 ENCOUNTER — Ambulatory Visit (HOSPITAL_BASED_OUTPATIENT_CLINIC_OR_DEPARTMENT_OTHER): Payer: BLUE CROSS/BLUE SHIELD

## 2016-02-13 ENCOUNTER — Other Ambulatory Visit (HOSPITAL_BASED_OUTPATIENT_CLINIC_OR_DEPARTMENT_OTHER): Payer: BLUE CROSS/BLUE SHIELD

## 2016-02-13 VITALS — BP 120/63 | HR 88 | Temp 98.4°F | Resp 18

## 2016-02-13 DIAGNOSIS — Z5111 Encounter for antineoplastic chemotherapy: Secondary | ICD-10-CM

## 2016-02-13 DIAGNOSIS — C50412 Malignant neoplasm of upper-outer quadrant of left female breast: Secondary | ICD-10-CM | POA: Diagnosis not present

## 2016-02-13 DIAGNOSIS — Z5189 Encounter for other specified aftercare: Secondary | ICD-10-CM

## 2016-02-13 DIAGNOSIS — Z171 Estrogen receptor negative status [ER-]: Principal | ICD-10-CM

## 2016-02-13 LAB — CBC WITH DIFFERENTIAL/PLATELET
BASO%: 0.9 % (ref 0.0–2.0)
Basophils Absolute: 0.1 10*3/uL (ref 0.0–0.1)
EOS%: 0.3 % (ref 0.0–7.0)
Eosinophils Absolute: 0 10*3/uL (ref 0.0–0.5)
HCT: 38.3 % (ref 34.8–46.6)
HGB: 13.3 g/dL (ref 11.6–15.9)
LYMPH%: 14 % (ref 14.0–49.7)
MCH: 32.9 pg (ref 25.1–34.0)
MCHC: 34.7 g/dL (ref 31.5–36.0)
MCV: 94.8 fL (ref 79.5–101.0)
MONO#: 1.3 10*3/uL — ABNORMAL HIGH (ref 0.1–0.9)
MONO%: 12.3 % (ref 0.0–14.0)
NEUT%: 72.5 % (ref 38.4–76.8)
NEUTROS ABS: 7.7 10*3/uL — AB (ref 1.5–6.5)
Platelets: 165 10*3/uL (ref 145–400)
RBC: 4.04 10*6/uL (ref 3.70–5.45)
RDW: 12.4 % (ref 11.2–14.5)
WBC: 10.6 10*3/uL — AB (ref 3.9–10.3)
lymph#: 1.5 10*3/uL (ref 0.9–3.3)

## 2016-02-13 MED ORDER — SODIUM CHLORIDE 0.9 % IV SOLN
600.0000 mg/m2 | Freq: Once | INTRAVENOUS | Status: AC
  Administered 2016-02-13: 1060 mg via INTRAVENOUS
  Filled 2016-02-13: qty 53

## 2016-02-13 MED ORDER — PALONOSETRON HCL INJECTION 0.25 MG/5ML
0.2500 mg | Freq: Once | INTRAVENOUS | Status: AC
  Administered 2016-02-13: 0.25 mg via INTRAVENOUS

## 2016-02-13 MED ORDER — SODIUM CHLORIDE 0.9 % IV SOLN
Freq: Once | INTRAVENOUS | Status: AC
  Administered 2016-02-13: 10:00:00 via INTRAVENOUS
  Filled 2016-02-13: qty 5

## 2016-02-13 MED ORDER — HEPARIN SOD (PORK) LOCK FLUSH 100 UNIT/ML IV SOLN
500.0000 [IU] | Freq: Once | INTRAVENOUS | Status: AC | PRN
  Administered 2016-02-13: 500 [IU]
  Filled 2016-02-13: qty 5

## 2016-02-13 MED ORDER — PEGFILGRASTIM 6 MG/0.6ML ~~LOC~~ PSKT
6.0000 mg | PREFILLED_SYRINGE | Freq: Once | SUBCUTANEOUS | Status: AC
  Administered 2016-02-13: 6 mg via SUBCUTANEOUS
  Filled 2016-02-13: qty 0.6

## 2016-02-13 MED ORDER — SODIUM CHLORIDE 0.9% FLUSH
10.0000 mL | INTRAVENOUS | Status: DC | PRN
  Administered 2016-02-13: 10 mL
  Filled 2016-02-13: qty 10

## 2016-02-13 MED ORDER — PALONOSETRON HCL INJECTION 0.25 MG/5ML
INTRAVENOUS | Status: AC
Start: 2016-02-13 — End: 2016-02-13
  Filled 2016-02-13: qty 5

## 2016-02-13 MED ORDER — SODIUM CHLORIDE 0.9 % IV SOLN
Freq: Once | INTRAVENOUS | Status: AC
  Administered 2016-02-13: 09:00:00 via INTRAVENOUS

## 2016-02-13 MED ORDER — DOXORUBICIN HCL CHEMO IV INJECTION 2 MG/ML
60.0000 mg/m2 | Freq: Once | INTRAVENOUS | Status: AC
  Administered 2016-02-13: 106 mg via INTRAVENOUS
  Filled 2016-02-13: qty 53

## 2016-02-13 NOTE — Patient Instructions (Signed)
[facility] Discharge Instructions for Patients Receiving Chemotherapy  Today you received the following chemotherapy agents: Adriamycin and Cytoxan   To help prevent nausea and vomiting after your treatment, we encourage you to take your nausea medication as directed.    If you develop nausea and vomiting that is not controlled by your nausea medication, call the clinic.   BELOW ARE SYMPTOMS THAT SHOULD BE REPORTED IMMEDIATELY:  *FEVER GREATER THAN 100.5 F  *CHILLS WITH OR WITHOUT FEVER  NAUSEA AND VOMITING THAT IS NOT CONTROLLED WITH YOUR NAUSEA MEDICATION  *UNUSUAL SHORTNESS OF BREATH  *UNUSUAL BRUISING OR BLEEDING  TENDERNESS IN MOUTH AND THROAT WITH OR WITHOUT PRESENCE OF ULCERS  *URINARY PROBLEMS  *BOWEL PROBLEMS  UNUSUAL RASH Items with * indicate a potential emergency and should be followed up as soon as possible.  Feel free to call the clinic you have any questions or concerns. The clinic phone number is [phone].  Please show the CHEMO ALERT CARD at check-in to the Emergency Department and triage nurse.   Doxorubicin injection What is this medicine? DOXORUBICIN (dox oh ROO bi sin) is a chemotherapy drug. It is used to treat many kinds of cancer like leukemia, lymphoma, neuroblastoma, sarcoma, and Wilms' tumor. It is also used to treat bladder cancer, breast cancer, lung cancer, ovarian cancer, stomach cancer, and thyroid cancer. This medicine may be used for other purposes; ask your health care provider or pharmacist if you have questions. COMMON BRAND NAME(S): Adriamycin, Adriamycin PFS, Adriamycin RDF, Rubex What should I tell my health care provider before I take this medicine? They need to know if you have any of these conditions: -heart disease -history of low blood counts caused by a medicine -liver disease -recent or ongoing radiation therapy -an unusual or allergic reaction to doxorubicin, other chemotherapy agents, other medicines,  foods, dyes, or preservatives -pregnant or trying to get pregnant -breast-feeding How should I use this medicine? This drug is given as an infusion into a vein. It is administered in a hospital or clinic by a specially trained health care professional. If you have pain, swelling, burning or any unusual feeling around the site of your injection, tell your health care professional right away. Talk to your pediatrician regarding the use of this medicine in children. Special care may be needed. Overdosage: If you think you have taken too much of this medicine contact a poison control center or emergency room at once. NOTE: This medicine is only for you. Do not share this medicine with others. What if I miss a dose? It is important not to miss your dose. Call your doctor or health care professional if you are unable to keep an appointment. What may interact with this medicine? This medicine may interact with the following medications: -6-mercaptopurine -paclitaxel -phenytoin -St. John's Wort -trastuzumab -verapamil This list may not describe all possible interactions. Give your health care provider a list of all the medicines, herbs, non-prescription drugs, or dietary supplements you use. Also tell them if you smoke, drink alcohol, or use illegal drugs. Some items may interact with your medicine. What should I watch for while using this medicine? This drug may make you feel generally unwell. This is not uncommon, as chemotherapy can affect healthy cells as well as cancer cells. Report any side effects. Continue your course of treatment even though you feel ill unless your doctor tells you to stop. There is a maximum amount of this medicine you should receive throughout your life. The amount   depends on the medical condition being treated and your overall health. Your doctor will watch how much of this medicine you receive in your lifetime. Tell your doctor if you have taken this medicine before. You  may need blood work done while you are taking this medicine. Your urine may turn red for a few days after your dose. This is not blood. If your urine is dark or brown, call your doctor. In some cases, you may be given additional medicines to help with side effects. Follow all directions for their use. Call your doctor or health care professional for advice if you get a fever, chills or sore throat, or other symptoms of a cold or flu. Do not treat yourself. This drug decreases your body's ability to fight infections. Try to avoid being around people who are sick. This medicine may increase your risk to bruise or bleed. Call your doctor or health care professional if you notice any unusual bleeding. Talk to your doctor about your risk of cancer. You may be more at risk for certain types of cancers if you take this medicine. Do not become pregnant while taking this medicine or for 6 months after stopping it. Women should inform their doctor if they wish to become pregnant or think they might be pregnant. Men should not father a child while taking this medicine and for 6 months after stopping it. There is a potential for serious side effects to an unborn child. Talk to your health care professional or pharmacist for more information. Do not breast-feed an infant while taking this medicine. This medicine has caused ovarian failure in some women and reduced sperm counts in some men This medicine may interfere with the ability to have a child. Talk with your doctor or health care professional if you are concerned about your fertility. What side effects may I notice from receiving this medicine? Side effects that you should report to your doctor or health care professional as soon as possible: -allergic reactions like skin rash, itching or hives, swelling of the face, lips, or tongue -breathing problems -chest pain -fast or irregular heartbeat -low blood counts - this medicine may decrease the number of white  blood cells, red blood cells and platelets. You may be at increased risk for infections and bleeding. -pain, redness, or irritation at site where injected -signs of infection - fever or chills, cough, sore throat, pain or difficulty passing urine -signs of decreased platelets or bleeding - bruising, pinpoint red spots on the skin, black, tarry stools, blood in the urine -swelling of the ankles, feet, hands -tiredness -weakness Side effects that usually do not require medical attention (report to your doctor or health care professional if they continue or are bothersome): -diarrhea -hair loss -mouth sores -nail discoloration or damage -nausea -red colored urine -vomiting This list may not describe all possible side effects. Call your doctor for medical advice about side effects. You may report side effects to FDA at 1-800-FDA-1088. Where should I keep my medicine? This drug is given in a hospital or clinic and will not be stored at home. NOTE: This sheet is a summary. It may not cover all possible information. If you have questions about this medicine, talk to your doctor, pharmacist, or health care provider.  2017 [employer] Standard (2015-03-13 11:28:51)   Cyclophosphamide injection What is this medicine? CYCLOPHOSPHAMIDE (sye kloe FOSS fa mide) is a chemotherapy drug. It slows the growth of cancer cells. This medicine is used to treat many types of   cancer like lymphoma, myeloma, leukemia, breast cancer, and ovarian cancer, to name a few. This medicine may be used for other purposes; ask your health care provider or pharmacist if you have questions. COMMON BRAND NAME(S): Cytoxan, Neosar What should I tell my health care provider before I take this medicine? They need to know if you have any of these conditions: -blood disorders -history of other chemotherapy -infection -kidney disease -liver disease -recent or ongoing radiation therapy -tumors in the bone marrow -an unusual  or allergic reaction to cyclophosphamide, other chemotherapy, other medicines, foods, dyes, or preservatives -pregnant or trying to get pregnant -breast-feeding How should I use this medicine? This drug is usually given as an injection into a vein or muscle or by infusion into a vein. It is administered in a hospital or clinic by a specially trained health care professional. Talk to your pediatrician regarding the use of this medicine in children. Special care may be needed. Overdosage: If you think you have taken too much of this medicine contact a poison control center or emergency room at once. NOTE: This medicine is only for you. Do not share this medicine with others. What if I miss a dose? It is important not to miss your dose. Call your doctor or health care professional if you are unable to keep an appointment. What may interact with this medicine? This medicine may interact with the following medications: -amiodarone -amphotericin B -azathioprine -certain antiviral medicines for HIV or AIDS such as protease inhibitors (e.g., indinavir, ritonavir) and zidovudine -certain blood pressure medications such as benazepril, captopril, enalapril, fosinopril, lisinopril, moexipril, monopril, perindopril, quinapril, ramipril, trandolapril -certain cancer medications such as anthracyclines (e.g., daunorubicin, doxorubicin), busulfan, cytarabine, paclitaxel, pentostatin, tamoxifen, trastuzumab -certain diuretics such as chlorothiazide, chlorthalidone, hydrochlorothiazide, indapamide, metolazone -certain medicines that treat or prevent blood clots like warfarin -certain muscle relaxants such as succinylcholine -cyclosporine -etanercept -indomethacin -medicines to increase blood counts like filgrastim, pegfilgrastim, sargramostim -medicines used as general anesthesia -metronidazole -natalizumab This list may not describe all possible interactions. Give your health care provider a list of all  the medicines, herbs, non-prescription drugs, or dietary supplements you use. Also tell them if you smoke, drink alcohol, or use illegal drugs. Some items may interact with your medicine. What should I watch for while using this medicine? Visit your doctor for checks on your progress. This drug may make you feel generally unwell. This is not uncommon, as chemotherapy can affect healthy cells as well as cancer cells. Report any side effects. Continue your course of treatment even though you feel ill unless your doctor tells you to stop. Drink water or other fluids as directed. Urinate often, even at night. In some cases, you may be given additional medicines to help with side effects. Follow all directions for their use. Call your doctor or health care professional for advice if you get a fever, chills or sore throat, or other symptoms of a cold or flu. Do not treat yourself. This drug decreases your body's ability to fight infections. Try to avoid being around people who are sick. This medicine may increase your risk to bruise or bleed. Call your doctor or health care professional if you notice any unusual bleeding. Be careful brushing and flossing your teeth or using a toothpick because you may get an infection or bleed more easily. If you have any dental work done, tell your dentist you are receiving this medicine. You may get drowsy or dizzy. Do not drive, use machinery, or do anything that   needs mental alertness until you know how this medicine affects you. Do not become pregnant while taking this medicine or for 1 year after stopping it. Women should inform their doctor if they wish to become pregnant or think they might be pregnant. Men should not father a child while taking this medicine and for 4 months after stopping it. There is a potential for serious side effects to an unborn child. Talk to your health care professional or pharmacist for more information. Do not breast-feed an infant while  taking this medicine. This medicine may interfere with the ability to have a child. This medicine has caused ovarian failure in some women. This medicine has caused reduced sperm counts in some men. You should talk with your doctor or health care professional if you are concerned about your fertility. If you are going to have surgery, tell your doctor or health care professional that you have taken this medicine. What side effects may I notice from receiving this medicine? Side effects that you should report to your doctor or health care professional as soon as possible: -allergic reactions like skin rash, itching or hives, swelling of the face, lips, or tongue -low blood counts - this medicine may decrease the number of white blood cells, red blood cells and platelets. You may be at increased risk for infections and bleeding. -signs of infection - fever or chills, cough, sore throat, pain or difficulty passing urine -signs of decreased platelets or bleeding - bruising, pinpoint red spots on the skin, black, tarry stools, blood in the urine -signs of decreased red blood cells - unusually weak or tired, fainting spells, lightheadedness -breathing problems -dark urine -dizziness -palpitations -swelling of the ankles, feet, hands -trouble passing urine or change in the amount of urine -weight gain -yellowing of the eyes or skin Side effects that usually do not require medical attention (report to your doctor or health care professional if they continue or are bothersome): -changes in nail or skin color -hair loss -missed menstrual periods -mouth sores -nausea, vomiting This list may not describe all possible side effects. Call your doctor for medical advice about side effects. You may report side effects to FDA at 1-800-FDA-1088. Where should I keep my medicine? This drug is given in a hospital or clinic and will not be stored at home. NOTE: This sheet is a summary. It may not cover all  possible information. If you have questions about this medicine, talk to your doctor, pharmacist, or health care provider.  2017 [employer]/Gold Standard (2011-11-29 16:22:58)   

## 2016-02-15 ENCOUNTER — Ambulatory Visit: Payer: BLUE CROSS/BLUE SHIELD

## 2016-02-16 ENCOUNTER — Other Ambulatory Visit: Payer: Self-pay | Admitting: *Deleted

## 2016-02-16 DIAGNOSIS — C50412 Malignant neoplasm of upper-outer quadrant of left female breast: Secondary | ICD-10-CM

## 2016-02-16 DIAGNOSIS — Z171 Estrogen receptor negative status [ER-]: Principal | ICD-10-CM

## 2016-02-19 ENCOUNTER — Other Ambulatory Visit (HOSPITAL_BASED_OUTPATIENT_CLINIC_OR_DEPARTMENT_OTHER): Payer: BLUE CROSS/BLUE SHIELD

## 2016-02-19 ENCOUNTER — Other Ambulatory Visit: Payer: Self-pay | Admitting: Oncology

## 2016-02-19 ENCOUNTER — Ambulatory Visit (HOSPITAL_BASED_OUTPATIENT_CLINIC_OR_DEPARTMENT_OTHER): Payer: BLUE CROSS/BLUE SHIELD | Admitting: Oncology

## 2016-02-19 VITALS — BP 133/75 | HR 104 | Temp 98.3°F | Resp 18 | Ht 65.0 in | Wt 148.7 lb

## 2016-02-19 DIAGNOSIS — C50412 Malignant neoplasm of upper-outer quadrant of left female breast: Secondary | ICD-10-CM | POA: Diagnosis not present

## 2016-02-19 DIAGNOSIS — Z171 Estrogen receptor negative status [ER-]: Secondary | ICD-10-CM | POA: Diagnosis not present

## 2016-02-19 LAB — COMPREHENSIVE METABOLIC PANEL
ALT: 15 U/L (ref 0–55)
AST: 15 U/L (ref 5–34)
Albumin: 3.6 g/dL (ref 3.5–5.0)
Alkaline Phosphatase: 106 U/L (ref 40–150)
Anion Gap: 6 mEq/L (ref 3–11)
BUN: 16.1 mg/dL (ref 7.0–26.0)
CO2: 29 meq/L (ref 22–29)
Calcium: 9.3 mg/dL (ref 8.4–10.4)
Chloride: 102 mEq/L (ref 98–109)
Creatinine: 0.7 mg/dL (ref 0.6–1.1)
Glucose: 79 mg/dl (ref 70–140)
POTASSIUM: 4 meq/L (ref 3.5–5.1)
SODIUM: 137 meq/L (ref 136–145)
TOTAL PROTEIN: 6.5 g/dL (ref 6.4–8.3)
Total Bilirubin: 0.45 mg/dL (ref 0.20–1.20)

## 2016-02-19 LAB — CBC WITH DIFFERENTIAL/PLATELET
BASO%: 2.6 % — ABNORMAL HIGH (ref 0.0–2.0)
Basophils Absolute: 0 10*3/uL (ref 0.0–0.1)
EOS ABS: 0 10*3/uL (ref 0.0–0.5)
EOS%: 0 % (ref 0.0–7.0)
HCT: 36.5 % (ref 34.8–46.6)
HGB: 12.7 g/dL (ref 11.6–15.9)
LYMPH%: 76.9 % — AB (ref 14.0–49.7)
MCH: 32.5 pg (ref 25.1–34.0)
MCHC: 34.8 g/dL (ref 31.5–36.0)
MCV: 93.4 fL (ref 79.5–101.0)
MONO#: 0 10*3/uL — ABNORMAL LOW (ref 0.1–0.9)
MONO%: 2.6 % (ref 0.0–14.0)
NEUT#: 0.1 10*3/uL — CL (ref 1.5–6.5)
NEUT%: 17.9 % — ABNORMAL LOW (ref 38.4–76.8)
Platelets: 150 10*3/uL (ref 145–400)
RBC: 3.91 10*6/uL (ref 3.70–5.45)
RDW: 12.2 % (ref 11.2–14.5)
WBC: 0.4 10*3/uL — AB (ref 3.9–10.3)
lymph#: 0.3 10*3/uL — ABNORMAL LOW (ref 0.9–3.3)

## 2016-02-19 MED ORDER — CIPROFLOXACIN HCL 500 MG PO TABS: 500.0000 mg | ORAL_TABLET | Freq: Two times a day (BID) | ORAL | 0 refills | Status: DC

## 2016-02-19 NOTE — Progress Notes (Signed)
[place]  [phone] [phone]     ID: [name] DOB: 1950-05-15  MR#: [id number]  XKG#:[id number]  Patient Care Team: Carlyle Basques, MD as PCP - General (Infectious Diseases) Carlyle Basques, MD as PCP - Infectious Diseases (Infectious Diseases) Alphonsa Overall, MD as Consulting Physician (General Surgery) Chauncey Cruel, MD as Consulting Physician (Oncology) Gery Pray, MD as Consulting Physician (Radiation Oncology) Marcial Pacas, MD as Consulting Physician (Neurology) Druscilla Brownie, MD as Consulting Physician (Dermatology) Chauncey Cruel, MD OTHER MD:  CHIEF COMPLAINT: Triple negative breast cancer  CURRENT TREATMENT: Adjuvant chemotherapy   BREAST CANCER HISTORY:  From the origil intake note:  [name] herself palpated a mass in the upper outer quadrant of her left breast the first week in November, and had bilateral diagnostic mammography with tomography and left breast ultrasonography at the [facility] 12/05/2015. The breast density was category C. In the upper outer left breast there was  A mass with macro lobulated contours which was barely palpable at the 12:30 o'clock position of the left breast 7 cm from the nipple. There was no palpable axillary adenopathy. Ultrasonography confirmed a 2.1 cm obliquely oriented hypoechoic mass in the area in question. Ultrasound of the left axilla was benign.  Biopsy of the left breast mass in question 12/07/2015 showed ([id number]) invasive ductal carcinoma, grade 3, estrogen and progesterone receptor negative, with an MIB-1 of 35%, and no HER-2 amplification, the signals ratio being 0.95 and the number per cell 2.80.  Her subsequent history is as detailed below  INTERVAL HISTORY: [name] returns today for follow-up of her high risk breast cancer, accompanied by her son Today is day 7 cycle 2 of 4 planned cycles of cyclophosphamide and doxorubicin which she is receiving in dose dense  fashion, to be followed by Abraxane 12.  REVIEW OF SYSTEMS: She did well with the treatment, the only new problem being some swallowing symptoms. It feels to her like things stick in her throat. She doesn't actually have a sore throat although she has a raspy throat. She is able to swallow okay. She also had some flushing from the steroids. This seemed to last a little longer. Of course she has lost her hair now. She is very phlegmatic about this and is comfortable just wearing soft fats. She has had no fever almond no weight loss, and no significant change in bowel or bladder habits. A detailed review of systems today was otherwise stable  PAST MEDICAL HISTORY: Past Medical History:  Diagnosis Date  . HIV (human immunodeficiency virus infection) ([facility])   . Hypertension   . Malignant neoplasm of upper-outer quadrant of left female breast ([facility]) 12/11/2015  . Migraine   . Neuropathy ([facility])   . PONV (postoperative nausea and vomiting)    said usually has to have scop patch  . Rash and nonspecific skin eruption 10/16/2015  . Spinal stenosis     PAST SURGICAL HISTORY: Past Surgical History:  Procedure Laterality Date  . ABDOMINAL HYSTERECTOMY  1990  . BREAST LUMPECTOMY WITH RADIOACTIVE SEED AND SENTINEL LYMPH NODE BIOPSY Left 01/01/2016   Procedure: LEFT BREAST LUMPECTOMY WITH RADIOACTIVE SEED AND SENTINEL LYMPH NODE BIOPSY;  Surgeon: Alphonsa Overall, MD;  Location: [facility];  Service: General;  Laterality: Left;  . CARPAL TUNNEL RELEASE     both hands  . CERVICAL FUSION  1989  . PORTACATH PLACEMENT Right 01/01/2016   Procedure: INSERTION PORT-A-CATH WITH [name];  Surgeon: Alphonsa Overall, MD;  Location: [facility];  Service: General;  Laterality: Right;  . TONSILLECTOMY      FAMILY HISTORY Family History  Problem Relation Age of Onset  . High blood pressure Mother   . High Cholesterol Mother   . Cancer Father   . High blood pressure Father   . High Cholesterol  Father   . Lung cancer Maternal Grandfather   . Breast cancer Maternal Aunt 66  The patient's father died from heart disease at the age of 41. The patient's mother died from "genetic cirrhosis" at the age of 33. The patient had 2 brothers, 5 sisters. A maternal aunt was diagnosed with breast cancer at age 33. There is no other history of breast or ovarian cancer in the family to the patient's knowledge   GYNECOLOGIC HISTORY:  No LMP recorded. Patient has had a hysterectomy. Menarche age 16, first live birth age 84. Patient is GX P1. She underwent hysterectomy with bilateral salpingo-oophorectomy at age 4. She took hormone replacement for approximately 10 years, until age 60. She also had oral contraceptives remotely for 8-10 years, with no complications.   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 At home is just she and her husband [name] who is retired from Teaching laboratory technician work. Their son [name] works as a Chief Strategy Officer for rest home in [place]. The patient has 3 grandchildren. She is not a Ambulance person.    ADVANCED DIRECTIVES: The patient has a living will in place   HEALTH MAINTENANCE: Social History  Substance Use Topics  . Smoking status: Never Smoker  . Smokeless tobacco: Never Used  . Alcohol use 0.6 oz/week    1 Glasses of wine per week     Comment: on weekends     Colonoscopy: 2012/be routinely  PAP: Status post hysterectomy  Bone density: Never   No Known Allergies  Current Outpatient Prescriptions  Medication Sig Dispense Refill  . amLODipine (NORVASC) 5 MG tablet Take 5 mg by mouth daily.    . benazepril (LOTENSIN) 40 MG tablet Take 40 mg by mouth daily.      . ciprofloxacin (CIPRO) 500 MG tablet Take 1 tablet (500 mg total) by mouth 2 (two) times daily. 30 tablet 0  . Coenzyme Q10 300 MG CAPS Take 300 capsules by mouth daily.    . cyclobenzaprine (FLEXERIL) 10 MG tablet Take 1 tablet (10 mg total) by mouth at bedtime. 90 tablet 3  .  dexamethasone (DECADRON) 4 MG tablet Take 2 tablets by mouth once a day on the day after chemotherapy and then take 2 tablets two times a day for 2 days. Take with food. 30 tablet 1  . dolutegravir (TIVICAY) 50 MG tablet Take 1 tablet (50 mg total) by mouth daily. 30 tablet 5  . emtricitabine-tenofovir AF (DESCOVY) 200-25 MG tablet Take 1 tablet by mouth daily. 30 tablet 5  . flunisolide (NASALIDE) 25 MCG/ACT (0.025%) SOLN Place 2 sprays into the nose 2 (two) times daily. 1 Bottle 1  . gabapentin (NEURONTIN) 100 MG capsule Take 1 capsule (100 mg total) by mouth 2 (two) times daily. 180 capsule 3  . lidocaine-prilocaine (EMLA) cream Apply to affected area once 30 g 3  . LORazepam (ATIVAN) 0.5 MG tablet Take 1 tablet (0.5 mg total) by mouth every 6 (six) hours as needed (Nausea or vomiting). 30 tablet 0  . naproxen sodium (ANAPROX) 220 MG tablet Take 220 mg by mouth daily.     . Omega-3 Fatty Acids (  FISH OIL) 1200 MG CAPS Take 1,200 mg by mouth 2 (two) times daily.      . prochlorperazine (COMPAZINE) 10 MG tablet Take 1 tablet (10 mg total) by mouth every 6 (six) hours as needed (Nausea or vomiting). 30 tablet 1  . rosuvastatin (CRESTOR) 5 MG tablet Take 1 tablet (5 mg total) by mouth at bedtime. 90 tablet 3   No current facility-administered medications for this visit.     OBJECTIVE: Middle-aged white woman In no acute distress Vitals:   02/19/16 1213  BP: 133/75  Pulse: (!) 104  Resp: 18  Temp: 98.3 F (36.8 C)     Body mass index is 24.74 kg/m.    ECOG FS:1 - Symptomatic but completely ambulatory  Sclerae unicteric, pupils round and equal Oropharynx clear and moist-- no thrush or other lesions No cervical or supraclavicular adenopathy Lungs no rales or rhonchi Heart regular rate and rhythm Abd soft, nontender, positive bowel sounds MSK no focal spinal tenderness, no upper extremity lymphedema Neuro: nonfocal, well oriented, appropriate affect Breasts: The right breast is  unremarkable. The left breast is status post lumpectomy. There is no evidence of local recurrence. The left axilla is benign.  LAB RESULTS:  CMP     Component Value Date/Time   NA 137 02/19/2016 1152   K 4.0 02/19/2016 1152   CL 100 10/16/2015 1103   CO2 29 02/19/2016 1152   GLUCOSE 79 02/19/2016 1152   BUN 16.1 02/19/2016 1152   CREATININE 0.7 02/19/2016 1152   CALCIUM 9.3 02/19/2016 1152   PROT 6.5 02/19/2016 1152   ALBUMIN 3.6 02/19/2016 1152   AST 15 02/19/2016 1152   ALT 15 02/19/2016 1152   ALKPHOS 106 02/19/2016 1152   BILITOT 0.45 02/19/2016 1152   GFRNONAA 65 10/16/2015 1103   GFRAA 75 10/16/2015 1103    INo results found for: SPEP, UPEP  Lab Results  Component Value Date   WBC 0.4 (LL) 02/19/2016   NEUTROABS 0.1 (LL) 02/19/2016   HGB 12.7 02/19/2016   HCT 36.5 02/19/2016   MCV 93.4 02/19/2016   PLT 150 02/19/2016      Chemistry      Component Value Date/Time   NA 137 02/19/2016 1152   K 4.0 02/19/2016 1152   CL 100 10/16/2015 1103   CO2 29 02/19/2016 1152   BUN 16.1 02/19/2016 1152   CREATININE 0.7 02/19/2016 1152      Component Value Date/Time   CALCIUM 9.3 02/19/2016 1152   ALKPHOS 106 02/19/2016 1152   AST 15 02/19/2016 1152   ALT 15 02/19/2016 1152   BILITOT 0.45 02/19/2016 1152       No results found for: LABCA2  No components found for: LABCA125  No results for input(s): INR in the last 168 hours.  Urinalysis    Component Value Date/Time   COLORURINE YELLOW 03/21/2007 1108   APPEARANCEUR CLEAR 03/21/2007 1108   LABSPEC 1.021 03/21/2007 1108   PHURINE 7.0 03/21/2007 1108   GLUCOSEU 100 (A) 03/21/2007 1108   HGBUR NEGATIVE 03/21/2007 1108   BILIRUBINUR NEGATIVE 03/21/2007 1108   KETONESUR NEGATIVE 03/21/2007 1108   PROTEINUR NEGATIVE 03/21/2007 1108   UROBILINOGEN 1.0 03/21/2007 1108   NITRITE NEGATIVE 03/21/2007 1108   LEUKOCYTESUR SMALL (A) 03/21/2007 1108     STUDIES: No results found.  ELIGIBLE FOR AVAILABLE  RESEARCH PROTOCOL: [facility] [phone]  ASSESSMENT: 66 y.o. [place] woman status post left breast upper outer quadrant biopsy 12/07/2015 for a clinical T2 N0, stage IIA invasive ductal  carcinoma, grade 3, triple negative, with an MIB-1 of 35%  (1) genetics testing 01/23/2016  (a) alpha - anti trypsin and ceruloplamin labs drawn 01/19/2016, both normal  (2) Status postft lumpectomy/ sentinel lymph node sampling 01/01/2016 for a pT1c pN0, stage IA  Invasive ductal carcinoma, grade 3, with negative margins.  (3) Mammaprint sent from the patient's 12/07/2015 biopsy returned "high risk", predicting a risk of recurrence of nearly 30% untreated, decreasing to a 93% chance of five-year distant metastasis free survival with chemotherapy.  (4) adjuvant chemotherapy will consist of cyclophosphamide and doxorubicin in dose dense fashion 4, followed by weekly Abraxane 12  (5) adjuvant radiation to follow  PLAN: [name] did generally well with her second cycle, but her counts are much lower. I am going to prophylax her with Cipro and she will take 1 tablet twice a day for 5 days. I emphasized the 5 days because I'm going ahead and giving her enough medication for the next 2 cycles as well. This will avoid delays since she gets all her medicines shaped in from [facility].  Her throat exam is entirely normal and I don't have a simple explanation for the sensation she has when swallowing. We will keep an eye on this.  She is terrified of catching the flu and would prefer not to come in a weekly basis to our office. I strongly sympathize and agree but for the current chemotherapy this is really necessary. This is very strong treatment and needs very close follow-up. Once we start the weekly Abraxane treatments we can relax the visit intervals.  I did reassure her that most of our patients are not like emergency room patient's, who have some kind of infectious problem.  She needed a jury duty letter and I was glad  to provide that for her.  She will get her next chemotherapy January 30. She then will see one of my partners a week after that. I will see her a week after her final cycle and set her up at that time for her Abraxane treatments.  She knows to call for any other problems that may develop before the next visit.   Chauncey Cruel, MD   02/19/2016 1:27 PM Medical Oncology and Hematology [facility] [address] [place], [place] [address] Tel. [phone]    Fax. [phone]

## 2016-02-19 NOTE — Progress Notes (Signed)
START ON PATHWAY REGIMEN - Breast  BOS176: AC-T - [Doxorubicin + Cyclophosphamide q21 Days x 4 Cycles, Followed by Paclitaxel Weekly x 12 Weeks]  Doxorubicin + Cyclophosphamide (AC):   A cycle is every 21 days:     Doxorubicin (Adriamycin(R)) 60 mg/m2 IV Push followed by Dose Mod: None     Cyclophosphamide (Cytoxan(R)) 600 mg/m2 in 250 mL NS IV over 30 minutes Dose Mod: None  **Always confirm dose/schedule in your pharmacy ordering system**    Paclitaxel 80 mg/m2 Weekly:   Administer weekly:     Paclitaxel (Taxol(R)) 80 mg/m2 in 250 mL NS IV over 1 hour Dose Mod: None  **Always confirm dose/schedule in your pharmacy ordering system**    Patient Characteristics: Adjuvant Therapy, Node Negative, HER2/neu Negative/Unknown/Equivocal, ER Negative AJCC Stage Grouping: Unknown Current Disease Status: No Distant Mets or Local Recurrence AJCC M Stage: X ER Status: Negative (-) AJCC N Stage: 0 AJCC T Stage: 1c HER2/neu: Negative (-) PR Status: Negative (-) Node Status: Negative (-)  Intent of Therapy: Curative Intent, Discussed with Patient

## 2016-02-26 ENCOUNTER — Telehealth: Payer: Self-pay | Admitting: Nurse Practitioner

## 2016-02-26 NOTE — Telephone Encounter (Signed)
Pt request refill for cyclobenzaprine (FLEXERIL) 10 MG tablet and gabapentin (NEURONTIN) 100 MG capsule sent to [facility], one month refills

## 2016-02-27 ENCOUNTER — Encounter: Payer: Self-pay | Admitting: Internal Medicine

## 2016-02-27 ENCOUNTER — Ambulatory Visit (HOSPITAL_BASED_OUTPATIENT_CLINIC_OR_DEPARTMENT_OTHER): Payer: BLUE CROSS/BLUE SHIELD

## 2016-02-27 ENCOUNTER — Other Ambulatory Visit (HOSPITAL_BASED_OUTPATIENT_CLINIC_OR_DEPARTMENT_OTHER): Payer: BLUE CROSS/BLUE SHIELD

## 2016-02-27 ENCOUNTER — Ambulatory Visit (INDEPENDENT_AMBULATORY_CARE_PROVIDER_SITE_OTHER): Payer: BLUE CROSS/BLUE SHIELD | Admitting: Internal Medicine

## 2016-02-27 VITALS — BP 125/79 | HR 108 | Temp 98.2°F | Wt 150.0 lb

## 2016-02-27 VITALS — BP 125/78 | HR 92 | Temp 97.8°F | Resp 20

## 2016-02-27 DIAGNOSIS — B20 Human immunodeficiency virus [HIV] disease: Secondary | ICD-10-CM

## 2016-02-27 DIAGNOSIS — C50412 Malignant neoplasm of upper-outer quadrant of left female breast: Secondary | ICD-10-CM

## 2016-02-27 DIAGNOSIS — T451X5A Adverse effect of antineoplastic and immunosuppressive drugs, initial encounter: Secondary | ICD-10-CM

## 2016-02-27 DIAGNOSIS — E7800 Pure hypercholesterolemia, unspecified: Secondary | ICD-10-CM | POA: Diagnosis not present

## 2016-02-27 DIAGNOSIS — Z171 Estrogen receptor negative status [ER-]: Principal | ICD-10-CM

## 2016-02-27 DIAGNOSIS — I10 Essential (primary) hypertension: Secondary | ICD-10-CM | POA: Diagnosis not present

## 2016-02-27 DIAGNOSIS — C50912 Malignant neoplasm of unspecified site of left female breast: Secondary | ICD-10-CM

## 2016-02-27 DIAGNOSIS — D701 Agranulocytosis secondary to cancer chemotherapy: Secondary | ICD-10-CM | POA: Diagnosis not present

## 2016-02-27 DIAGNOSIS — Z5189 Encounter for other specified aftercare: Secondary | ICD-10-CM

## 2016-02-27 DIAGNOSIS — Z5111 Encounter for antineoplastic chemotherapy: Secondary | ICD-10-CM

## 2016-02-27 LAB — CBC WITH DIFFERENTIAL/PLATELET
BASO%: 0.4 % (ref 0.0–2.0)
BASOS ABS: 0 10*3/uL (ref 0.0–0.1)
EOS ABS: 0 10*3/uL (ref 0.0–0.5)
EOS%: 0 % (ref 0.0–7.0)
HEMATOCRIT: 36.3 % (ref 34.8–46.6)
HEMOGLOBIN: 12.5 g/dL (ref 11.6–15.9)
LYMPH#: 1 10*3/uL (ref 0.9–3.3)
LYMPH%: 7.4 % — ABNORMAL LOW (ref 14.0–49.7)
MCH: 32.7 pg (ref 25.1–34.0)
MCHC: 34.4 g/dL (ref 31.5–36.0)
MCV: 95 fL (ref 79.5–101.0)
MONO#: 1.1 10*3/uL — AB (ref 0.1–0.9)
MONO%: 8.7 % (ref 0.0–14.0)
NEUT#: 11 10*3/uL — ABNORMAL HIGH (ref 1.5–6.5)
NEUT%: 83.5 % — AB (ref 38.4–76.8)
PLATELETS: 325 10*3/uL (ref 145–400)
RBC: 3.82 10*6/uL (ref 3.70–5.45)
RDW: 12.5 % (ref 11.2–14.5)
WBC: 13.1 10*3/uL — ABNORMAL HIGH (ref 3.9–10.3)

## 2016-02-27 LAB — BASIC METABOLIC PANEL
ANION GAP: 9 meq/L (ref 3–11)
BUN: 10.2 mg/dL (ref 7.0–26.0)
CALCIUM: 9.1 mg/dL (ref 8.4–10.4)
CO2: 24 meq/L (ref 22–29)
Chloride: 108 mEq/L (ref 98–109)
Creatinine: 0.7 mg/dL (ref 0.6–1.1)
EGFR: 90 mL/min/{1.73_m2} — ABNORMAL LOW (ref >90)
GLUCOSE: 96 mg/dL (ref 70–140)
Potassium: 3 mEq/L — CL (ref 3.5–5.1)
Sodium: 142 mEq/L (ref 136–145)

## 2016-02-27 MED ORDER — POTASSIUM CHLORIDE IN NACL 20-0.9 MEQ/L-% IV SOLN: INTRAVENOUS | Status: DC

## 2016-02-27 MED ORDER — BENAZEPRIL HCL 40 MG PO TABS: 40.0000 mg | ORAL_TABLET | Freq: Every day | ORAL | 5 refills | Status: DC

## 2016-02-27 MED ORDER — AMLODIPINE BESYLATE 5 MG PO TABS
5.0000 mg | ORAL_TABLET | Freq: Every day | ORAL | 5 refills | Status: DC
Start: 2016-02-27 — End: 2016-07-18

## 2016-02-27 MED ORDER — POTASSIUM CHLORIDE 2 MEQ/ML IV SOLN
INTRAVENOUS | Status: AC
  Administered 2016-02-27: 14:00:00 via INTRAVENOUS
  Filled 2016-02-27: qty 250

## 2016-02-27 MED ORDER — SODIUM CHLORIDE 0.9% FLUSH
10.0000 mL | INTRAVENOUS | Status: DC | PRN
  Administered 2016-02-27: 10 mL
  Filled 2016-02-27: qty 10

## 2016-02-27 MED ORDER — PALONOSETRON HCL INJECTION 0.25 MG/5ML
INTRAVENOUS | Status: AC
  Filled 2016-02-27: qty 5

## 2016-02-27 MED ORDER — FLUNISOLIDE 25 MCG/ACT (0.025%) NA SOLN: 2.0000 | Freq: Two times a day (BID) | NASAL | 3 refills | Status: DC

## 2016-02-27 MED ORDER — SODIUM CHLORIDE 0.9 % IV SOLN
Freq: Once | INTRAVENOUS | Status: AC
  Administered 2016-02-27: 13:00:00 via INTRAVENOUS
  Filled 2016-02-27: qty 5

## 2016-02-27 MED ORDER — CYCLOBENZAPRINE HCL 10 MG PO TABS: 10.0000 mg | ORAL_TABLET | Freq: Every day | ORAL | 1 refills | Status: DC

## 2016-02-27 MED ORDER — SODIUM CHLORIDE 0.9 % IV SOLN
600.0000 mg/m2 | Freq: Once | INTRAVENOUS | Status: AC
  Administered 2016-02-27: 1060 mg via INTRAVENOUS
  Filled 2016-02-27: qty 53

## 2016-02-27 MED ORDER — HEPARIN SOD (PORK) LOCK FLUSH 100 UNIT/ML IV SOLN
500.0000 [IU] | Freq: Once | INTRAVENOUS | Status: AC | PRN
  Administered 2016-02-27: 500 [IU]
  Filled 2016-02-27: qty 5

## 2016-02-27 MED ORDER — ROSUVASTATIN CALCIUM 5 MG PO TABS: 5.0000 mg | ORAL_TABLET | Freq: Every day | ORAL | 5 refills | Status: DC

## 2016-02-27 MED ORDER — DOXORUBICIN HCL CHEMO IV INJECTION 2 MG/ML
60.0000 mg/m2 | Freq: Once | INTRAVENOUS | Status: AC
  Administered 2016-02-27: 106 mg via INTRAVENOUS
  Filled 2016-02-27: qty 53

## 2016-02-27 MED ORDER — SODIUM CHLORIDE 0.9 % IV SOLN
Freq: Once | INTRAVENOUS | Status: AC
  Administered 2016-02-27: 12:00:00 via INTRAVENOUS

## 2016-02-27 MED ORDER — PEGFILGRASTIM 6 MG/0.6ML ~~LOC~~ PSKT
6.0000 mg | PREFILLED_SYRINGE | Freq: Once | SUBCUTANEOUS | Status: AC
  Administered 2016-02-27: 6 mg via SUBCUTANEOUS
  Filled 2016-02-27: qty 0.6

## 2016-02-27 MED ORDER — SULFAMETHOXAZOLE-TRIMETHOPRIM 400-80 MG PO TABS: 1.0000 | ORAL_TABLET | Freq: Every day | ORAL | 1 refills | Status: DC

## 2016-02-27 MED ORDER — PALONOSETRON HCL INJECTION 0.25 MG/5ML
0.2500 mg | Freq: Once | INTRAVENOUS | Status: AC
  Administered 2016-02-27: 0.25 mg via INTRAVENOUS

## 2016-02-27 MED ORDER — GABAPENTIN 100 MG PO CAPS: 100.0000 mg | ORAL_CAPSULE | Freq: Two times a day (BID) | ORAL | 1 refills | Status: DC

## 2016-02-27 NOTE — Telephone Encounter (Signed)
[facility]  Change made to [name]'s (for 6 months supply).  Has annual exam 08-2016 with CM/ NP.

## 2016-02-27 NOTE — Progress Notes (Signed)
Patient ID: [name], female   DOB: May 20, 1950, 66 y.o.   MRN: [id number]  HPI [name] is a 65yo F with well controlled HIV disease. She was diagnosed with breast Ca in Nov 2017 when she found a lump on self-exam. She is dx with invasive ductal ca, grade 3, ER/PR negative. She is s/p left lumpectomy and sentinel LN sampling on 01/01/16 which had negative margins. She is undergoing adjuvant chemo - currently on 2nd of 4 cycles of cyclophosphamide and doxorubicin followed by abraxane x 12 weeks. In regards to her HIV, her regimen was Changed to tivicay/descovy in order to minimize drug interactions with chemotherapy. She has lost her hair on her 2nd session. She has done well. Has had some thrush and mucositis. She was started on cipro for oi proph during neutropenic phase.  Outpatient Encounter Prescriptions as of 02/27/2016  Medication Sig  . amLODipine (NORVASC) 5 MG tablet Take 5 mg by mouth daily.  . benazepril (LOTENSIN) 40 MG tablet Take 40 mg by mouth daily.    Marland Kitchen dexamethasone (DECADRON) 4 MG tablet Take 2 tablets by mouth once a day on the day after chemotherapy and then take 2 tablets two times a day for 2 days. Take with food.  . dolutegravir (TIVICAY) 50 MG tablet Take 1 tablet (50 mg total) by mouth daily.  Marland Kitchen emtricitabine-tenofovir AF (DESCOVY) 200-25 MG tablet Take 1 tablet by mouth daily.  Marland Kitchen gabapentin (NEURONTIN) 100 MG capsule Take 1 capsule (100 mg total) by mouth 2 (two) times daily.  Marland Kitchen lidocaine-prilocaine (EMLA) cream Apply to affected area once  . prochlorperazine (COMPAZINE) 10 MG tablet Take 1 tablet (10 mg total) by mouth every 6 (six) hours as needed (Nausea or vomiting).  . rosuvastatin (CRESTOR) 5 MG tablet Take 1 tablet (5 mg total) by mouth at bedtime.  . ciprofloxacin (CIPRO) 500 MG tablet Take 1 tablet (500 mg total) by mouth 2 (two) times daily. (Patient not taking: Reported on 02/27/2016)  . Coenzyme Q10 300 MG CAPS Take 300 capsules by mouth daily.  .  cyclobenzaprine (FLEXERIL) 10 MG tablet Take 1 tablet (10 mg total) by mouth at bedtime.  . flunisolide (NASALIDE) 25 MCG/ACT (0.025%) SOLN Place 2 sprays into the nose 2 (two) times daily. (Patient not taking: Reported on 02/27/2016)  . Omega-3 Fatty Acids (FISH OIL) 1200 MG CAPS Take 1,200 mg by mouth 2 (two) times daily.    . [DISCONTINUED] LORazepam (ATIVAN) 0.5 MG tablet Take 1 tablet (0.5 mg total) by mouth every 6 (six) hours as needed (Nausea or vomiting).  . [DISCONTINUED] naproxen sodium (ANAPROX) 220 MG tablet Take 220 mg by mouth daily.    No facility-administered encounter medications on file as of 02/27/2016.      Patient Active Problem List   Diagnosis Date Noted  . Malignant neoplasm of upper-outer quadrant of left breast in female, estrogen receptor negative ([facility]) 12/11/2015  . Rash and nonspecific skin eruption 10/16/2015  . Hereditary and idiopathic peripheral neuropathy 07/16/2012  . Lumbar neuritis 07/16/2012  . Cervicalgia 07/16/2012  . URI 12/02/2006  . Human immunodeficiency virus (HIV) disease ([place]) 11/08/2005  . HYPERLIPIDEMIA 11/08/2005  . Essential hypertension 11/08/2005  . BRONCHITIS 11/08/2005  . CROHN'S DISEASE 11/08/2005  . SYMPTOM, PAINFUL RESPIRATION 11/08/2005     Health Maintenance Due  Topic Date Due  . COLONOSCOPY  11/20/2001  . PAP SMEAR  12/17/2010  . ZOSTAVAX  11/21/2011     Review of Systems + tastes appears changed  since being on chemo. Denies thrush. Physical Exam   BP 125/79   Pulse (!) 108   Temp 98.2 F (36.8 C) (Oral)   Wt 150 lb (68 kg)   BMI 24.96 kg/m   Physical Exam  Constitutional:  oriented to person, place, and time. appears well-developed and well-nourished. No distress.  HENT: [place]/AT, PERRLA, no scleral icterus Mouth/Throat: Oropharynx is clear and moist. No oropharyngeal exudate.  Cardiovascular: Normal rate, regular rhythm and normal heart sounds. Exam reveals no gallop and no friction rub.  No murmur heard.    Pulmonary/Chest: Effort normal and breath sounds normal. No respiratory distress.  has no wheezes.  Neck = supple, no nuchal rigidity Abdominal: Soft. Bowel sounds are normal.  exhibits no distension. There is no tenderness.  Lymphadenopathy: no cervical adenopathy. No axillary adenopathy Neurological: alert and oriented to person, place, and time.  Skin: Skin is warm and dry. No rash noted. No erythema.  Psychiatric: a normal mood and affect.  behavior is normal.   Lab Results  Component Value Date   CD4TCELL 36 02/01/2016   Lab Results  Component Value Date   CD4TABS 900 02/01/2016   CD4TABS 890 10/16/2015   CD4TABS 880 05/01/2015   Lab Results  Component Value Date   HIV1RNAQUANT <20 02/01/2016   Lab Results  Component Value Date   HEPBSAB No 03/24/2006   Lab Results  Component Value Date   LABRPR NON REAC 10/16/2015    CBC Lab Results  Component Value Date   WBC 0.4 (LL) 02/19/2016   RBC 3.91 02/19/2016   HGB 12.7 02/19/2016   HCT 36.5 02/19/2016   PLT 150 02/19/2016   MCV 93.4 02/19/2016   MCH 32.5 02/19/2016   MCHC 34.8 02/19/2016   RDW 12.2 02/19/2016   LYMPHSABS 0.3 (L) 02/19/2016   MONOABS 0.0 (L) 02/19/2016   EOSABS 0.0 02/19/2016    BMET Lab Results  Component Value Date   NA 137 02/19/2016   K 4.0 02/19/2016   CL 100 10/16/2015   CO2 29 02/19/2016   GLUCOSE 79 02/19/2016   BUN 16.1 02/19/2016   CREATININE 0.7 02/19/2016   CALCIUM 9.3 02/19/2016   GFRNONAA 65 10/16/2015   GFRAA 75 10/16/2015      Assessment and Plan  hiv disease =continue on current regimen. Appears still well controlled Hypertension =  - refills for amlodipine, benazepril,   Hyperlipidemia = will give refills on rosuvastatin,   Allergic rhinitis/sinusitis = will give flunisolide to use as needed  Leukopenia/neutropenia- start oi proph with bactrim ss dialy

## 2016-02-27 NOTE — Patient Instructions (Addendum)
[facility] Discharge Instructions for Patients Receiving Chemotherapy  Today you received the following chemotherapy agents Adriamycin and Cytoxan   To help prevent nausea and vomiting after your treatment, we encourage you to take your nausea medication as directed. No Zofran for 3 days. Take Compazine instead.    If you develop nausea and vomiting that is not controlled by your nausea medication, call the clinic.   BELOW ARE SYMPTOMS THAT SHOULD BE REPORTED IMMEDIATELY:  *FEVER GREATER THAN 100.5 F  *CHILLS WITH OR WITHOUT FEVER  NAUSEA AND VOMITING THAT IS NOT CONTROLLED WITH YOUR NAUSEA MEDICATION  *UNUSUAL SHORTNESS OF BREATH  *UNUSUAL BRUISING OR BLEEDING  TENDERNESS IN MOUTH AND THROAT WITH OR WITHOUT PRESENCE OF ULCERS  *URINARY PROBLEMS  *BOWEL PROBLEMS  UNUSUAL RASH Items with * indicate a potential emergency and should be followed up as soon as possible.  Feel free to call the clinic you have any questions or concerns. The clinic phone number is [phone].  Please show the [facility] at check-in to the Emergency Department and triage nurse.  Potassium Content of Foods Potassium is a mineral found in many foods and drinks. It helps keep fluids and minerals balanced in your body and affects how steadily your heart beats. Potassium also helps control your blood pressure and keep your muscles and nervous system healthy. Certain health conditions and medicines may change the balance of potassium in your body. When this happens, you can help balance your level of potassium through the foods that you do or do not eat. Your health care provider or dietitian may recommend an amount of potassium that you should have each day. The following lists of foods provide the amount of potassium (in parentheses) per serving in each item. High in potassium The following foods and beverages have 200 mg or more of potassium per serving:  Apricots, 2 raw or 5 dry  (200 mg).  Artichoke, 1 medium (345 mg).  Avocado, raw,  each (245 mg).  Banana, 1 medium (425 mg).  Beans, lima, or baked beans, canned,  cup (280 mg).  Beans, white, canned,  cup (595 mg).  Beef roast, 3 oz (320 mg).  Beef, ground, 3 oz (270 mg).  Beets, raw or cooked,  cup (260 mg).  Bran muffin, 2 oz (300 mg).  Broccoli,  cup (230 mg).  Brussels sprouts,  cup (250 mg).  Cantaloupe,  cup (215 mg).  Cereal, 100% bran,  cup (200-400 mg).  Cheeseburger, single, fast food, 1 each (225-400 mg).  Chicken, 3 oz (220 mg).  Clams, canned, 3 oz (535 mg).  Crab, 3 oz (225 mg).  Dates, 5 each (270 mg).  Dried beans and peas,  cup (300-475 mg).  Figs, dried, 2 each (260 mg).  Fish: halibut, tuna, cod, snapper, 3 oz (480 mg).  Fish: salmon, haddock, swordfish, perch, 3 oz (300 mg).  Fish, tuna, canned 3 oz (200 mg).  [place] fries, fast food, 3 oz (470 mg).  Granola with fruit and nuts,  cup (200 mg).  Grapefruit juice,  cup (200 mg).  Greens, beet,  cup (655 mg).  Honeydew melon,  cup (200 mg).  Kale, raw, 1 cup (300 mg).  Kiwi, 1 medium (240 mg).  Kohlrabi, rutabaga, parsnips,  cup (280 mg).  Lentils,  cup (365 mg).  Mango, 1 each (325 mg).  Milk, chocolate, 1 cup (420 mg).  Milk: nonfat, low-fat, whole, buttermilk, 1 cup (350-380 mg).  Molasses, 1 Tbsp (295 mg).  Mushrooms,  cup (280) mg.  Nectarine, 1 each (275 mg).  Nuts: almonds, peanuts, hazelnuts, [place], cashew, mixed, 1 oz (200 mg).  Nuts, pistachios, 1 oz (295 mg).  Orange, 1 each (240 mg).  Orange juice,  cup (235 mg).  Papaya, medium,  fruit (390 mg).  Peanut butter, chunky, 2 Tbsp (240 mg).  Peanut butter, smooth, 2 Tbsp (210 mg).  Pear, 1 medium (200 mg).  Pomegranate, 1 whole (400 mg).  Pomegranate juice,  cup (215 mg).  Pork, 3 oz (350 mg).  Potato chips, salted, 1 oz (465 mg).  Potato, baked with skin, 1 medium (925 mg).  Potatoes, boiled,   cup (255 mg).  Potatoes, mashed,  cup (330 mg).  Prune juice,  cup (370 mg).  Prunes, 5 each (305 mg).  Pudding, chocolate,  cup (230 mg).  Pumpkin, canned,  cup (250 mg).  Raisins, seedless,  cup (270 mg).  Seeds, sunflower or pumpkin, 1 oz (240 mg).  Soy milk, 1 cup (300 mg).  Spinach,  cup (420 mg).  Spinach, canned,  cup (370 mg).  Sweet potato, baked with skin, 1 medium (450 mg).  Swiss chard,  cup (480 mg).  Tomato or vegetable juice,  cup (275 mg).  Tomato sauce or puree,  cup (400-550 mg).  Tomato, raw, 1 medium (290 mg).  Tomatoes, canned,  cup (200-300 mg).  [place], 3 oz (250 mg).  Wheat germ, 1 oz (250 mg).  Winter squash,  cup (250 mg).  Yogurt, plain or fruited, 6 oz (260-435 mg).  Zucchini,  cup (220 mg). Moderate in potassium The following foods and beverages have 50-200 mg of potassium per serving:  Apple, 1 each (150 mg).  Apple juice,  cup (150 mg).  Applesauce,  cup (90 mg).  Apricot nectar,  cup (140 mg).  Asparagus, small spears,  cup or 6 spears (155 mg).  Bagel, cinnamon raisin, 1 each (130 mg).  Bagel, egg or plain, 4 in., 1 each (70 mg).  Beans, green,  cup (90 mg).  Beans, yellow,  cup (190 mg).  Beer, regular, 12 oz (100 mg).  Beets, canned,  cup (125 mg).  Blackberries,  cup (115 mg).  Blueberries,  cup (60 mg).  Bread, whole wheat, 1 slice (70 mg).  Broccoli, raw,  cup (145 mg).  Cabbage,  cup (150 mg).  Carrots, cooked or raw,  cup (180 mg).  Cauliflower, raw,  cup (150 mg).  Celery, raw,  cup (155 mg).  Cereal, bran flakes, cup (120-150 mg).  Cheese, cottage,  cup (110 mg).  Cherries, 10 each (150 mg).  Chocolate, 1 oz bar (165 mg).  Coffee, brewed 6 oz (90 mg).  Corn,  cup or 1 ear (195 mg).  Cucumbers,  cup (80 mg).  Egg, large, 1 each (60 mg).  Eggplant,  cup (60 mg).  Endive, raw, cup (80 mg).  English muffin, 1 each (65 mg).  Fish, orange  roughy, 3 oz (150 mg).  Frankfurter, beef or pork, 1 each (75 mg).  Fruit cocktail,  cup (115 mg).  Grape juice,  cup (170 mg).  Grapefruit,  fruit (175 mg).  Grapes,  cup (155 mg).  Greens: kale, turnip, collard,  cup (110-150 mg).  Ice cream or frozen yogurt, chocolate,  cup (175 mg).  Ice cream or frozen yogurt, vanilla,  cup (120-150 mg).  Lemons, limes, 1 each (80 mg).  Lettuce, all types, 1 cup (100 mg).  Mixed vegetables,  cup (150 mg).  Mushrooms, raw,  cup (110 mg).  Nuts: walnuts, pecans, or macadamia, 1 oz (125 mg).  Oatmeal,  cup (80 mg).  Okra,  cup (110 mg).  Onions, raw,  cup (120 mg).  Peach, 1 each (185 mg).  Peaches, canned,  cup (120 mg).  Pears, canned,  cup (120 mg).  Peas, green, frozen,  cup (90 mg).  Peppers, green,  cup (130 mg).  Peppers, red,  cup (160 mg).  Pineapple juice,  cup (165 mg).  Pineapple, fresh or canned,  cup (100 mg).  Plums, 1 each (105 mg).  Pudding, vanilla,  cup (150 mg).  Raspberries,  cup (90 mg).  Rhubarb,  cup (115 mg).  Rice, wild,  cup (80 mg).  Shrimp, 3 oz (155 mg).  Spinach, raw, 1 cup (170 mg).  Strawberries,  cup (125 mg).  Summer squash  cup (175-200 mg).  Swiss chard, raw, 1 cup (135 mg).  Tangerines, 1 each (140 mg).  Tea, brewed, 6 oz (65 mg).  Turnips,  cup (140 mg).  Watermelon,  cup (85 mg).  Wine, red, table, 5 oz (180 mg).  Wine, white, table, 5 oz (100 mg). Low in potassium The following foods and beverages have less than 50 mg of potassium per serving.  Bread, white, 1 slice (30 mg).  Carbonated beverages, 12 oz (less than 5 mg).  Cheese, 1 oz (20-30 mg).  Cranberries,  cup (45 mg).  Cranberry juice cocktail,  cup (20 mg).  Fats and oils, 1 Tbsp (less than 5 mg).  Hummus, 1 Tbsp (32 mg).  Nectar: papaya, mango, or pear,  cup (35 mg).  Rice, white or brown,  cup (50 mg).  Spaghetti or macaroni,  cup cooked (30  mg).  Tortilla, flour or corn, 1 each (50 mg).  Waffle, 4 in., 1 each (50 mg).  Water chestnuts,  cup (40 mg). This information is not intended to replace advice given to you by your health care provider. Make sure you discuss any questions you have with your health care provider. Document Released: 08/28/2004 Document Revised: 06/22/2015 Document Reviewed: 12/11/2012 Elsevier Interactive Patient Education  2017 [employer].

## 2016-02-27 NOTE — Telephone Encounter (Signed)
I called and spoke to [name], husband and relayed that did escribed new prescriptions to [facility] for 90 day supply.  I noted afterwards that pt wanted 30day supply.  I called and spoke to Roderic Palau at [facility] that pt prefers 30day and he stated would note on pts chart (that she wanted 30day supply).  Ok to do with prescriptions sent.  (no need to change).

## 2016-02-29 ENCOUNTER — Telehealth: Payer: Self-pay | Admitting: Pharmacist Clinician (PhC)/ Clinical Pharmacy Specialist

## 2016-02-29 ENCOUNTER — Ambulatory Visit: Payer: BLUE CROSS/BLUE SHIELD

## 2016-02-29 NOTE — Telephone Encounter (Signed)
[name] called to ask about some abx questions. Onc prescribed her some cipro for likely febrile neutropenia prophylaxis while she is on chemo. She was wondering if it's ok with her septra. Told her that each will do different things so ok to take both.

## 2016-03-05 ENCOUNTER — Other Ambulatory Visit (HOSPITAL_BASED_OUTPATIENT_CLINIC_OR_DEPARTMENT_OTHER): Payer: BLUE CROSS/BLUE SHIELD

## 2016-03-05 ENCOUNTER — Encounter: Payer: BLUE CROSS/BLUE SHIELD | Admitting: Hematology

## 2016-03-05 DIAGNOSIS — C50412 Malignant neoplasm of upper-outer quadrant of left female breast: Secondary | ICD-10-CM | POA: Diagnosis not present

## 2016-03-05 DIAGNOSIS — Z171 Estrogen receptor negative status [ER-]: Principal | ICD-10-CM

## 2016-03-05 LAB — COMPREHENSIVE METABOLIC PANEL
ALBUMIN: 3.7 g/dL (ref 3.5–5.0)
ALK PHOS: 94 U/L (ref 40–150)
ALT: 14 U/L (ref 0–55)
AST: 14 U/L (ref 5–34)
Anion Gap: 8 mEq/L (ref 3–11)
BUN: 14.9 mg/dL (ref 7.0–26.0)
CALCIUM: 9.5 mg/dL (ref 8.4–10.4)
CO2: 28 mEq/L (ref 22–29)
Chloride: 102 mEq/L (ref 98–109)
Creatinine: 0.8 mg/dL (ref 0.6–1.1)
EGFR: 84 mL/min/{1.73_m2} — AB (ref >90)
Glucose: 88 mg/dl (ref 70–140)
POTASSIUM: 3.9 meq/L (ref 3.5–5.1)
Sodium: 138 mEq/L (ref 136–145)
Total Bilirubin: 0.35 mg/dL (ref 0.20–1.20)
Total Protein: 6.5 g/dL (ref 6.4–8.3)

## 2016-03-05 LAB — CBC WITH DIFFERENTIAL/PLATELET
BASO%: 4.5 % — ABNORMAL HIGH (ref 0.0–2.0)
BASOS ABS: 0 10*3/uL (ref 0.0–0.1)
EOS ABS: 0 10*3/uL (ref 0.0–0.5)
EOS%: 0 % (ref 0.0–7.0)
HEMATOCRIT: 33.5 % — AB (ref 34.8–46.6)
HEMOGLOBIN: 11.5 g/dL — AB (ref 11.6–15.9)
LYMPH%: 72.7 % — ABNORMAL HIGH (ref 14.0–49.7)
MCH: 32.7 pg (ref 25.1–34.0)
MCHC: 34.3 g/dL (ref 31.5–36.0)
MCV: 95.2 fL (ref 79.5–101.0)
MONO#: 0 10*3/uL — ABNORMAL LOW (ref 0.1–0.9)
MONO%: 9.1 % (ref 0.0–14.0)
NEUT#: 0 10*3/uL — CL (ref 1.5–6.5)
NEUT%: 13.7 % — ABNORMAL LOW (ref 38.4–76.8)
Platelets: 124 10*3/uL — ABNORMAL LOW (ref 145–400)
RBC: 3.52 10*6/uL — ABNORMAL LOW (ref 3.70–5.45)
RDW: 12.7 % (ref 11.2–14.5)
WBC: 0.2 10*3/uL — CL (ref 3.9–10.3)
lymph#: 0.2 10*3/uL — ABNORMAL LOW (ref 0.9–3.3)

## 2016-03-05 MED ORDER — NYSTATIN 100000 UNIT/ML MT SUSP: 5.0000 mL | Freq: Four times a day (QID) | OROMUCOSAL | 0 refills | Status: DC

## 2016-03-07 ENCOUNTER — Other Ambulatory Visit: Payer: Self-pay | Admitting: *Deleted

## 2016-03-07 DIAGNOSIS — B20 Human immunodeficiency virus [HIV] disease: Secondary | ICD-10-CM

## 2016-03-09 NOTE — Progress Notes (Signed)
I was very busy with other sick patients today. Pt waited for long time in the exam room and left before I saw her.   I called her and spoke with her when she got home. Lab results reviewed with her. She is neutropenic with ANC 0, no fever, I recommend her to start prophylactic Cipro. She knows to call us if she has fever or other new symptoms. My nurse also noticed she has oral thrush, I called in nystatin oral mouth wash for her.  She is scheduled to see Dr. Johnnye Sima next week.   Truitt Merle  03/05/2016  This encounter was created in error - please disregard.

## 2016-03-12 ENCOUNTER — Other Ambulatory Visit: Payer: Self-pay | Admitting: Oncology

## 2016-03-12 ENCOUNTER — Other Ambulatory Visit: Payer: Self-pay | Admitting: Internal Medicine

## 2016-03-12 ENCOUNTER — Ambulatory Visit (HOSPITAL_BASED_OUTPATIENT_CLINIC_OR_DEPARTMENT_OTHER): Payer: BLUE CROSS/BLUE SHIELD

## 2016-03-12 ENCOUNTER — Encounter: Payer: Self-pay | Admitting: *Deleted

## 2016-03-12 ENCOUNTER — Other Ambulatory Visit (HOSPITAL_BASED_OUTPATIENT_CLINIC_OR_DEPARTMENT_OTHER): Payer: BLUE CROSS/BLUE SHIELD

## 2016-03-12 VITALS — BP 139/71 | HR 99 | Temp 98.4°F | Resp 18

## 2016-03-12 DIAGNOSIS — Z171 Estrogen receptor negative status [ER-]: Secondary | ICD-10-CM

## 2016-03-12 DIAGNOSIS — Z5111 Encounter for antineoplastic chemotherapy: Secondary | ICD-10-CM | POA: Diagnosis not present

## 2016-03-12 DIAGNOSIS — Z5189 Encounter for other specified aftercare: Secondary | ICD-10-CM | POA: Diagnosis not present

## 2016-03-12 DIAGNOSIS — C50412 Malignant neoplasm of upper-outer quadrant of left female breast: Secondary | ICD-10-CM | POA: Diagnosis not present

## 2016-03-12 DIAGNOSIS — B20 Human immunodeficiency virus [HIV] disease: Secondary | ICD-10-CM

## 2016-03-12 LAB — CBC WITH DIFFERENTIAL/PLATELET
BASO%: 0.6 % (ref 0.0–2.0)
BASOS ABS: 0.1 10*3/uL (ref 0.0–0.1)
EOS%: 0 % (ref 0.0–7.0)
Eosinophils Absolute: 0 10*3/uL (ref 0.0–0.5)
HCT: 34.1 % — ABNORMAL LOW (ref 34.8–46.6)
HGB: 11.8 g/dL (ref 11.6–15.9)
LYMPH%: 9.1 % — AB (ref 14.0–49.7)
MCH: 32.9 pg (ref 25.1–34.0)
MCHC: 34.6 g/dL (ref 31.5–36.0)
MCV: 95 fL (ref 79.5–101.0)
MONO#: 1.3 10*3/uL — AB (ref 0.1–0.9)
MONO%: 13.5 % (ref 0.0–14.0)
NEUT#: 7.6 10*3/uL — ABNORMAL HIGH (ref 1.5–6.5)
NEUT%: 76.8 % (ref 38.4–76.8)
Platelets: 237 10*3/uL (ref 145–400)
RBC: 3.59 10*6/uL — AB (ref 3.70–5.45)
RDW: 14.4 % (ref 11.2–14.5)
WBC: 9.9 10*3/uL (ref 3.9–10.3)
lymph#: 0.9 10*3/uL (ref 0.9–3.3)

## 2016-03-12 MED ORDER — DOXORUBICIN HCL CHEMO IV INJECTION 2 MG/ML
60.0000 mg/m2 | Freq: Once | INTRAVENOUS | Status: AC
  Administered 2016-03-12: 106 mg via INTRAVENOUS
  Filled 2016-03-12: qty 53

## 2016-03-12 MED ORDER — SODIUM CHLORIDE 0.9% FLUSH
10.0000 mL | INTRAVENOUS | Status: DC | PRN
  Administered 2016-03-12: 10 mL
  Filled 2016-03-12: qty 10

## 2016-03-12 MED ORDER — PALONOSETRON HCL INJECTION 0.25 MG/5ML
INTRAVENOUS | Status: AC
  Filled 2016-03-12: qty 5

## 2016-03-12 MED ORDER — HEPARIN SOD (PORK) LOCK FLUSH 100 UNIT/ML IV SOLN
500.0000 [IU] | Freq: Once | INTRAVENOUS | Status: AC | PRN
  Administered 2016-03-12: 500 [IU]
  Filled 2016-03-12: qty 5

## 2016-03-12 MED ORDER — SODIUM CHLORIDE 0.9 % IV SOLN
600.0000 mg/m2 | Freq: Once | INTRAVENOUS | Status: AC
  Administered 2016-03-12: 1060 mg via INTRAVENOUS
  Filled 2016-03-12: qty 53

## 2016-03-12 MED ORDER — PEGFILGRASTIM 6 MG/0.6ML ~~LOC~~ PSKT
6.0000 mg | PREFILLED_SYRINGE | Freq: Once | SUBCUTANEOUS | Status: AC
  Administered 2016-03-12: 6 mg via SUBCUTANEOUS
  Filled 2016-03-12: qty 0.6

## 2016-03-12 MED ORDER — PALONOSETRON HCL INJECTION 0.25 MG/5ML
0.2500 mg | Freq: Once | INTRAVENOUS | Status: AC
  Administered 2016-03-12: 0.25 mg via INTRAVENOUS

## 2016-03-12 MED ORDER — SODIUM CHLORIDE 0.9 % IV SOLN
Freq: Once | INTRAVENOUS | Status: AC
  Administered 2016-03-12: 10:00:00 via INTRAVENOUS
  Filled 2016-03-12: qty 5

## 2016-03-12 MED ORDER — SODIUM CHLORIDE 0.9 % IV SOLN
Freq: Once | INTRAVENOUS | Status: AC
  Administered 2016-03-12: 10:00:00 via INTRAVENOUS

## 2016-03-12 NOTE — Patient Instructions (Signed)
[name] Discharge Instructions for Patients Receiving Chemotherapy  Today you received the following chemotherapy agents Adriamycin and Cytoxan   To help prevent nausea and vomiting after your treatment, we encourage you to take your nausea medication as directed. No Zofran for 3 days. Take Compazine instead.    If you develop nausea and vomiting that is not controlled by your nausea medication, call the clinic.   BELOW ARE SYMPTOMS THAT SHOULD BE REPORTED IMMEDIATELY:  *FEVER GREATER THAN 100.5 F  *CHILLS WITH OR WITHOUT FEVER  NAUSEA AND VOMITING THAT IS NOT CONTROLLED WITH YOUR NAUSEA MEDICATION  *UNUSUAL SHORTNESS OF BREATH  *UNUSUAL BRUISING OR BLEEDING  TENDERNESS IN MOUTH AND THROAT WITH OR WITHOUT PRESENCE OF ULCERS  *URINARY PROBLEMS  *BOWEL PROBLEMS  UNUSUAL RASH Items with * indicate a potential emergency and should be followed up as soon as possible.  Feel free to call the clinic you have any questions or concerns. The clinic phone number is [phone].  Please show the [facility] at check-in to the Emergency Department and triage nurse.  Potassium Content of Foods Potassium is a mineral found in many foods and drinks. It helps keep fluids and minerals balanced in your body and affects how steadily your heart beats. Potassium also helps control your blood pressure and keep your muscles and nervous system healthy. Certain health conditions and medicines may change the balance of potassium in your body. When this happens, you can help balance your level of potassium through the foods that you do or do not eat. Your health care provider or dietitian may recommend an amount of potassium that you should have each day. The following lists of foods provide the amount of potassium (in parentheses) per serving in each item. High in potassium The following foods and beverages have 200 mg or more of potassium per serving:  Apricots, 2 raw or 5 dry  (200 mg).  Artichoke, 1 medium (345 mg).  Avocado, raw,  each (245 mg).  Banana, 1 medium (425 mg).  Beans, lima, or baked beans, canned,  cup (280 mg).  Beans, white, canned,  cup (595 mg).  Beef roast, 3 oz (320 mg).  Beef, ground, 3 oz (270 mg).  Beets, raw or cooked,  cup (260 mg).  Bran muffin, 2 oz (300 mg).  Broccoli,  cup (230 mg).  Brussels sprouts,  cup (250 mg).  Cantaloupe,  cup (215 mg).  Cereal, 100% bran,  cup (200-400 mg).  Cheeseburger, single, fast food, 1 each (225-400 mg).  Chicken, 3 oz (220 mg).  Clams, canned, 3 oz (535 mg).  Crab, 3 oz (225 mg).  Dates, 5 each (270 mg).  Dried beans and peas,  cup (300-475 mg).  Figs, dried, 2 each (260 mg).  Fish: halibut, tuna, cod, snapper, 3 oz (480 mg).  Fish: salmon, haddock, swordfish, perch, 3 oz (300 mg).  Fish, tuna, canned 3 oz (200 mg).  [place] fries, fast food, 3 oz (470 mg).  Granola with fruit and nuts,  cup (200 mg).  Grapefruit juice,  cup (200 mg).  Greens, beet,  cup (655 mg).  Honeydew melon,  cup (200 mg).  Kale, raw, 1 cup (300 mg).  Kiwi, 1 medium (240 mg).  Kohlrabi, rutabaga, parsnips,  cup (280 mg).  Lentils,  cup (365 mg).  Mango, 1 each (325 mg).  Milk, chocolate, 1 cup (420 mg).  Milk: nonfat, low-fat, whole, buttermilk, 1 cup (350-380 mg).  Molasses, 1 Tbsp (295 mg).  Mushrooms,  cup (280) mg.  Nectarine, 1 each (275 mg).  Nuts: almonds, peanuts, hazelnuts, [place], cashew, mixed, 1 oz (200 mg).  Nuts, pistachios, 1 oz (295 mg).  Orange, 1 each (240 mg).  Orange juice,  cup (235 mg).  Papaya, medium,  fruit (390 mg).  Peanut butter, chunky, 2 Tbsp (240 mg).  Peanut butter, smooth, 2 Tbsp (210 mg).  Pear, 1 medium (200 mg).  Pomegranate, 1 whole (400 mg).  Pomegranate juice,  cup (215 mg).  Pork, 3 oz (350 mg).  Potato chips, salted, 1 oz (465 mg).  Potato, baked with skin, 1 medium (925 mg).  Potatoes, boiled,   cup (255 mg).  Potatoes, mashed,  cup (330 mg).  Prune juice,  cup (370 mg).  Prunes, 5 each (305 mg).  Pudding, chocolate,  cup (230 mg).  Pumpkin, canned,  cup (250 mg).  Raisins, seedless,  cup (270 mg).  Seeds, sunflower or pumpkin, 1 oz (240 mg).  Soy milk, 1 cup (300 mg).  Spinach,  cup (420 mg).  Spinach, canned,  cup (370 mg).  Sweet potato, baked with skin, 1 medium (450 mg).  Swiss chard,  cup (480 mg).  Tomato or vegetable juice,  cup (275 mg).  Tomato sauce or puree,  cup (400-550 mg).  Tomato, raw, 1 medium (290 mg).  Tomatoes, canned,  cup (200-300 mg).  [place], 3 oz (250 mg).  Wheat germ, 1 oz (250 mg).  Winter squash,  cup (250 mg).  Yogurt, plain or fruited, 6 oz (260-435 mg).  Zucchini,  cup (220 mg). Moderate in potassium The following foods and beverages have 50-200 mg of potassium per serving:  Apple, 1 each (150 mg).  Apple juice,  cup (150 mg).  Applesauce,  cup (90 mg).  Apricot nectar,  cup (140 mg).  Asparagus, small spears,  cup or 6 spears (155 mg).  Bagel, cinnamon raisin, 1 each (130 mg).  Bagel, egg or plain, 4 in., 1 each (70 mg).  Beans, green,  cup (90 mg).  Beans, yellow,  cup (190 mg).  Beer, regular, 12 oz (100 mg).  Beets, canned,  cup (125 mg).  Blackberries,  cup (115 mg).  Blueberries,  cup (60 mg).  Bread, whole wheat, 1 slice (70 mg).  Broccoli, raw,  cup (145 mg).  Cabbage,  cup (150 mg).  Carrots, cooked or raw,  cup (180 mg).  Cauliflower, raw,  cup (150 mg).  Celery, raw,  cup (155 mg).  Cereal, bran flakes, cup (120-150 mg).  Cheese, cottage,  cup (110 mg).  Cherries, 10 each (150 mg).  Chocolate, 1 oz bar (165 mg).  Coffee, brewed 6 oz (90 mg).  Corn,  cup or 1 ear (195 mg).  Cucumbers,  cup (80 mg).  Egg, large, 1 each (60 mg).  Eggplant,  cup (60 mg).  Endive, raw, cup (80 mg).  English muffin, 1 each (65 mg).  Fish, orange  roughy, 3 oz (150 mg).  Frankfurter, beef or pork, 1 each (75 mg).  Fruit cocktail,  cup (115 mg).  Grape juice,  cup (170 mg).  Grapefruit,  fruit (175 mg).  Grapes,  cup (155 mg).  Greens: kale, turnip, collard,  cup (110-150 mg).  Ice cream or frozen yogurt, chocolate,  cup (175 mg).  Ice cream or frozen yogurt, vanilla,  cup (120-150 mg).  Lemons, limes, 1 each (80 mg).  Lettuce, all types, 1 cup (100 mg).  Mixed vegetables,  cup (150 mg).  Mushrooms, raw,  cup (110 mg).  Nuts: walnuts, pecans, or macadamia, 1 oz (125 mg).  Oatmeal,  cup (80 mg).  Okra,  cup (110 mg).  Onions, raw,  cup (120 mg).  Peach, 1 each (185 mg).  Peaches, canned,  cup (120 mg).  Pears, canned,  cup (120 mg).  Peas, green, frozen,  cup (90 mg).  Peppers, green,  cup (130 mg).  Peppers, red,  cup (160 mg).  Pineapple juice,  cup (165 mg).  Pineapple, fresh or canned,  cup (100 mg).  Plums, 1 each (105 mg).  Pudding, vanilla,  cup (150 mg).  Raspberries,  cup (90 mg).  Rhubarb,  cup (115 mg).  Rice, wild,  cup (80 mg).  Shrimp, 3 oz (155 mg).  Spinach, raw, 1 cup (170 mg).  Strawberries,  cup (125 mg).  Summer squash  cup (175-200 mg).  Swiss chard, raw, 1 cup (135 mg).  Tangerines, 1 each (140 mg).  Tea, brewed, 6 oz (65 mg).  Turnips,  cup (140 mg).  Watermelon,  cup (85 mg).  Wine, red, table, 5 oz (180 mg).  Wine, white, table, 5 oz (100 mg). Low in potassium The following foods and beverages have less than 50 mg of potassium per serving.  Bread, white, 1 slice (30 mg).  Carbonated beverages, 12 oz (less than 5 mg).  Cheese, 1 oz (20-30 mg).  Cranberries,  cup (45 mg).  Cranberry juice cocktail,  cup (20 mg).  Fats and oils, 1 Tbsp (less than 5 mg).  Hummus, 1 Tbsp (32 mg).  Nectar: papaya, mango, or pear,  cup (35 mg).  Rice, white or brown,  cup (50 mg).  Spaghetti or macaroni,  cup cooked (30  mg).  Tortilla, flour or corn, 1 each (50 mg).  Waffle, 4 in., 1 each (50 mg).  Water chestnuts,  cup (40 mg). This information is not intended to replace advice given to you by your health care provider. Make sure you discuss any questions you have with your health care provider. Document Released: 08/28/2004 Document Revised: 06/22/2015 Document Reviewed: 12/11/2012 Elsevier Interactive Patient Education  2017 [employer].

## 2016-03-14 ENCOUNTER — Ambulatory Visit: Payer: BLUE CROSS/BLUE SHIELD

## 2016-03-19 ENCOUNTER — Encounter (INDEPENDENT_AMBULATORY_CARE_PROVIDER_SITE_OTHER): Payer: Self-pay

## 2016-03-19 ENCOUNTER — Ambulatory Visit (HOSPITAL_BASED_OUTPATIENT_CLINIC_OR_DEPARTMENT_OTHER): Payer: BLUE CROSS/BLUE SHIELD | Admitting: Oncology

## 2016-03-19 ENCOUNTER — Telehealth: Payer: Self-pay | Admitting: Oncology

## 2016-03-19 ENCOUNTER — Other Ambulatory Visit (HOSPITAL_BASED_OUTPATIENT_CLINIC_OR_DEPARTMENT_OTHER): Payer: BLUE CROSS/BLUE SHIELD

## 2016-03-19 VITALS — BP 106/82 | HR 115 | Temp 97.8°F | Resp 18 | Ht 65.0 in | Wt 146.4 lb

## 2016-03-19 DIAGNOSIS — K5000 Crohn's disease of small intestine without complications: Secondary | ICD-10-CM

## 2016-03-19 DIAGNOSIS — C50412 Malignant neoplasm of upper-outer quadrant of left female breast: Secondary | ICD-10-CM | POA: Diagnosis not present

## 2016-03-19 DIAGNOSIS — Z171 Estrogen receptor negative status [ER-]: Secondary | ICD-10-CM

## 2016-03-19 DIAGNOSIS — B20 Human immunodeficiency virus [HIV] disease: Secondary | ICD-10-CM | POA: Diagnosis not present

## 2016-03-19 LAB — CBC WITH DIFFERENTIAL/PLATELET
HEMATOCRIT: 30.1 % — AB (ref 34.8–46.6)
HEMOGLOBIN: 10.5 g/dL — AB (ref 11.6–15.9)
MCH: 32.6 pg (ref 25.1–34.0)
MCHC: 34.9 g/dL (ref 31.5–36.0)
MCV: 93.5 fL (ref 79.5–101.0)
Platelets: 81 10*3/uL — ABNORMAL LOW (ref 145–400)
RBC: 3.22 10*6/uL — ABNORMAL LOW (ref 3.70–5.45)
RDW: 13.7 % (ref 11.2–14.5)
WBC: 0.2 10*3/uL — AB (ref 3.9–10.3)

## 2016-03-19 LAB — COMPREHENSIVE METABOLIC PANEL
ALT: 12 U/L (ref 0–55)
AST: 12 U/L (ref 5–34)
Albumin: 3.6 g/dL (ref 3.5–5.0)
Alkaline Phosphatase: 88 U/L (ref 40–150)
Anion Gap: 8 mEq/L (ref 3–11)
BUN: 14.5 mg/dL (ref 7.0–26.0)
CO2: 25 mEq/L (ref 22–29)
Calcium: 9.3 mg/dL (ref 8.4–10.4)
Chloride: 102 mEq/L (ref 98–109)
Creatinine: 0.7 mg/dL (ref 0.6–1.1)
EGFR: >90 mL/min/{1.73_m2} (ref >90)
Glucose: 89 mg/dl (ref 70–140)
Potassium: 4.2 mEq/L (ref 3.5–5.1)
Sodium: 135 mEq/L — ABNORMAL LOW (ref 136–145)
Total Bilirubin: 0.63 mg/dL (ref 0.20–1.20)
Total Protein: 6.3 g/dL — ABNORMAL LOW (ref 6.4–8.3)

## 2016-03-19 LAB — TECHNOLOGIST REVIEW

## 2016-03-19 MED ORDER — FLUCONAZOLE 100 MG PO TABS: 100.0000 mg | ORAL_TABLET | Freq: Every day | ORAL | 0 refills | Status: DC

## 2016-03-19 NOTE — Progress Notes (Signed)
[place]  [phone] [phone]     ID: [name] DOB: 08/27/1950  MR#: [id number]  PYK#:[id number]  Patient Care Team: [name] Basques, MD as PCP - General (Infectious Diseases) [name] Basques, MD as PCP - Infectious Diseases (Infectious Diseases) [name] Overall, MD as Consulting Physician (General Surgery) [name] Cruel, MD as Consulting Physician (Oncology) [name] Pray, MD as Consulting Physician (Radiation Oncology) [name] Pacas, MD as Consulting Physician (Neurology) [name] Brownie, MD as Consulting Physician (Dermatology) [name] Cruel, MD OTHER MD:  CHIEF COMPLAINT: Triple negative breast cancer  CURRENT TREATMENT: Adjuvant chemotherapy   BREAST CANCER HISTORY:  From the origil intake note:  [name] herself palpated a mass in the upper outer quadrant of her left breast the first week in November, and had bilateral diagnostic mammography with tomography and left breast ultrasonography at the [facility] 12/05/2015. The breast density was category C. In the upper outer left breast there was  A mass with macro lobulated contours which was barely palpable at the 12:30 o'clock position of the left breast 7 cm from the nipple. There was no palpable axillary adenopathy. Ultrasonography confirmed a 2.1 cm obliquely oriented hypoechoic mass in the area in question. Ultrasound of the left axilla was benign.  Biopsy of the left breast mass in question 12/07/2015 showed ([id number]) invasive ductal carcinoma, grade 3, estrogen and progesterone receptor negative, with an MIB-1 of 35%, and no HER-2 amplification, the signals ratio being 0.95 and the number per cell 2.80.  Her subsequent history is as detailed below  INTERVAL HISTORY: [name] today for follow-up of her breast canceraccompanied by her son. Currently she is day 9 cycle 4 of 4 planned cycles of doxorubicin and cyclophosphamide. She has completed that portion of her  treatment and will start her taxanes in 2 weeks.  She developed thrush and was treated withnystatin suspension, which did not quite clear at. It got better but then he got a bit worse again when we started the prophylactic ciprofloxacin. She has also developed a yeast infection which she treated herself with miconazole. That is improved.  REVIEW OF SYSTEMS: Her port has worked well. She has made sure to have soft bowel movements throughout. She only lost 3 pounds through the 2 months of treatment. She does have more arthritis pain than before. This is not focal. It is mild to moderate. She has a little bit of difficulty swallowing because of the thrush but she is keeping herself well hydrated. Aside from these issues a detailed review of systems today was stable  PAST MEDICAL HISTORY: Past Medical History:  Diagnosis Date  . HIV (human immunodeficiency virus infection) ([place])   . Hypertension   . Malignant neoplasm of upper-outer quadrant of left female breast ([place]) 12/11/2015  . Migraine   . Neuropathy ([place])   . PONV (postoperative nausea and vomiting)    said usually has to have scop patch  . Rash and nonspecific skin eruption 10/16/2015  . Spinal stenosis     PAST SURGICAL HISTORY: Past Surgical History:  Procedure Laterality Date  . ABDOMINAL HYSTERECTOMY  1990  . BREAST LUMPECTOMY WITH RADIOACTIVE SEED AND SENTINEL LYMPH NODE BIOPSY Left 01/01/2016   Procedure: LEFT BREAST LUMPECTOMY WITH RADIOACTIVE SEED AND SENTINEL LYMPH NODE BIOPSY;  Surgeon: [name] Overall, MD;  Location: [facility];  Service: General;  Laterality: Left;  . CARPAL TUNNEL RELEASE     both hands  . CERVICAL FUSION  1989  . PORTACATH PLACEMENT Right  01/01/2016   Procedure: INSERTION PORT-A-CATH WITH [name];  Surgeon: [name] Overall, MD;  Location: [facility];  Service: General;  Laterality: Right;  . TONSILLECTOMY      FAMILY HISTORY Family History  Problem Relation Age of Onset  . High  blood pressure Mother   . High Cholesterol Mother   . Cancer Father   . High blood pressure Father   . High Cholesterol Father   . Lung cancer Maternal Grandfather   . Breast cancer Maternal Aunt 73  The patient's father died from heart disease at the age of 106. The patient's mother died from "genetic cirrhosis" at the age of 26. The patient had 2 brothers, 5 sisters. A maternal aunt was diagnosed with breast cancer at age 79. There is no other history of breast or ovarian cancer in the family to the patient's knowledge   GYNECOLOGIC HISTORY:  No LMP recorded. Patient has had a hysterectomy. Menarche age 41, first live birth age 89. Patient is GX P1. She underwent hysterectomy with bilateral salpingo-oophorectomy at age 77. She took hormone replacement for approximately 10 years, until age 46. She also had oral contraceptives remotely for 8-10 years, with no complications.   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 At home is just she and her husband [name] who is retired from Teaching laboratory technician work. Their son [name] works as a Chief Strategy Officer for rest home in [place]. The patient has 3 grandchildren. She is not a Ambulance person.    ADVANCED DIRECTIVES: The patient has a living will in place   HEALTH MAINTENANCE: Social History  Substance Use Topics  . Smoking status: Never Smoker  . Smokeless tobacco: Never Used  . Alcohol use 0.6 oz/week    1 Glasses of wine per week     Comment: on weekends     Colonoscopy: 2012/be routinely  PAP: Status post hysterectomy  Bone density: Never   No Known Allergies  Current Outpatient Prescriptions  Medication Sig Dispense Refill  . amLODipine (NORVASC) 5 MG tablet Take 1 tablet (5 mg total) by mouth daily. 30 tablet 5  . benazepril (LOTENSIN) 40 MG tablet Take 1 tablet (40 mg total) by mouth daily. 30 tablet 5  . ciprofloxacin (CIPRO) 500 MG tablet Take 1 tablet (500 mg total) by mouth 2 (two) times daily. 30 tablet  0  . Coenzyme Q10 300 MG CAPS Take 300 capsules by mouth daily.    . cyclobenzaprine (FLEXERIL) 10 MG tablet Take 1 tablet (10 mg total) by mouth at bedtime. 90 tablet 1  . dexamethasone (DECADRON) 4 MG tablet Take 2 tablets by mouth once a day on the day after chemotherapy and then take 2 tablets two times a day for 2 days. Take with food. (Patient not taking: Reported on 03/05/2016) 30 tablet 1  . dolutegravir (TIVICAY) 50 MG tablet Take 1 tablet (50 mg total) by mouth daily. 30 tablet 5  . emtricitabine-tenofovir AF (DESCOVY) 200-25 MG tablet Take 1 tablet by mouth daily. 30 tablet 5  . flunisolide (NASALIDE) 25 MCG/ACT (0.025%) SOLN Place 2 sprays into the nose 2 (two) times daily. 1 Bottle 3  . gabapentin (NEURONTIN) 100 MG capsule Take 1 capsule (100 mg total) by mouth 2 (two) times daily. 180 capsule 1  . lidocaine-prilocaine (EMLA) cream Apply to affected area once 30 g 3  . nystatin (MYCOSTATIN) 100000 UNIT/ML suspension Take 5 mLs (500,000 Units total) by mouth 4 (four) times daily. 473 mL  0  . Omega-3 Fatty Acids (FISH OIL) 1200 MG CAPS Take 1,200 mg by mouth 2 (two) times daily.      . prochlorperazine (COMPAZINE) 10 MG tablet Take 1 tablet (10 mg total) by mouth every 6 (six) hours as needed (Nausea or vomiting). 30 tablet 1  . rosuvastatin (CRESTOR) 5 MG tablet Take 1 tablet (5 mg total) by mouth at bedtime. 30 tablet 5  . sulfamethoxazole-trimethoprim (BACTRIM,SEPTRA) 400-80 MG tablet TAKE ONE TABLET BY MOUTH EVERY DAY 30 tablet 2   No current facility-administered medications for this visit.     OBJECTIVE: Middle-aged white woman who appears stated age  66:   03/19/16 1151  BP: 106/82  Pulse: (!) 115  Resp: 18  Temp: 97.8 F (36.6 C)     Body mass index is 24.36 kg/m.    ECOG FS:1 - Symptomatic but completely ambulatory  Sclerae unicteric, EOMs intact Oropharynx clear and moist No cervical or supraclavicular adenopathy Lungs no rales or rhonchi Heart regular rate  and rhythm Abd soft, nontender, positive bowel sounds MSK no focal spinal tenderness, no upper extremity lymphedema Neuro: nonfocal, well oriented, appropriate affect Breasts: the right breast is benign. The left breast is status post lumpectomy. Is no evidence of local recurrence. The left axilla is benign.  LAB RESULTS:  CMP     Component Value Date/Time   NA 138 03/05/2016 1239   K 3.9 03/05/2016 1239   CL 100 10/16/2015 1103   CO2 28 03/05/2016 1239   GLUCOSE 88 03/05/2016 1239   BUN 14.9 03/05/2016 1239   CREATININE 0.8 03/05/2016 1239   CALCIUM 9.5 03/05/2016 1239   PROT 6.5 03/05/2016 1239   ALBUMIN 3.7 03/05/2016 1239   AST 14 03/05/2016 1239   ALT 14 03/05/2016 1239   ALKPHOS 94 03/05/2016 1239   BILITOT 0.35 03/05/2016 1239   GFRNONAA 65 10/16/2015 1103   GFRAA 75 10/16/2015 1103    INo results found for: SPEP, UPEP  Lab Results  Component Value Date   WBC 9.9 03/12/2016   NEUTROABS 7.6 (H) 03/12/2016   HGB 11.8 03/12/2016   HCT 34.1 (L) 03/12/2016   MCV 95.0 03/12/2016   PLT 237 03/12/2016      Chemistry      Component Value Date/Time   NA 138 03/05/2016 1239   K 3.9 03/05/2016 1239   CL 100 10/16/2015 1103   CO2 28 03/05/2016 1239   BUN 14.9 03/05/2016 1239   CREATININE 0.8 03/05/2016 1239      Component Value Date/Time   CALCIUM 9.5 03/05/2016 1239   ALKPHOS 94 03/05/2016 1239   AST 14 03/05/2016 1239   ALT 14 03/05/2016 1239   BILITOT 0.35 03/05/2016 1239       No results found for: LABCA2  No components found for: LABCA125  No results for input(s): INR in the last 168 hours.  Urinalysis    Component Value Date/Time   COLORURINE YELLOW 03/21/2007 1108   APPEARANCEUR CLEAR 03/21/2007 1108   LABSPEC 1.021 03/21/2007 1108   PHURINE 7.0 03/21/2007 1108   GLUCOSEU 100 (A) 03/21/2007 1108   HGBUR NEGATIVE 03/21/2007 1108   BILIRUBINUR NEGATIVE 03/21/2007 1108   KETONESUR NEGATIVE 03/21/2007 1108   PROTEINUR NEGATIVE 03/21/2007 1108    UROBILINOGEN 1.0 03/21/2007 1108   NITRITE NEGATIVE 03/21/2007 1108   LEUKOCYTESUR SMALL (A) 03/21/2007 1108     STUDIES: No results found.  ELIGIBLE FOR AVAILABLE RESEARCH PROTOCOL: [facility] [phone]  ASSESSMENT: 66 y.o. [place] woman status post  left breast upper outer quadrant biopsy 12/07/2015 for a clinical T2 N0, stage IIA invasive ductal carcinoma, grade 3, triple negative, with an MIB-1 of 35%  (1) genetics testing 01/23/2016  (a) alpha - anti trypsin and ceruloplamin labs drawn 01/19/2016, both normal  (2) Status postft lumpectomy/ sentinel lymph node sampling 01/01/2016 for a pT1c pN0, stage IA  Invasive ductal carcinoma, grade 3, with negative margins.  (3) Mammaprint sent from the patient's 12/07/2015 biopsy returned "high risk", predicting a risk of recurrence of nearly 30% untreated, decreasing to a 93% chance of five-year distant metastasis free survival with chemotherapy.  (4) adjuvant chemotherapy consisting of cyclophosphamide and doxorubicin in dose dense fashion 4 started01/04/2016, completed 03/12/2016, to be followed by weekly Abraxane 12 starting 04/02/2016  (5) adjuvant radiation to follow  PLAN: [name] has completed the more difficult portion of her chemotherapy plan and has done remarkably well. We did not have to reduce her dose or increase her dosing interval. She never developed a fever despite the very low counts.  She did develop thrush partly from the steroids and we are having a little trouble controlling that. The mouthwash is not doing it. I am writing her for Diflucan to take daily for 3 days. I think that will clear it and then she can use the mouthwash daily until she is done with the prophylactic ciprofloxacin.  She is now ready to start her taxanes area I am going to get Abraxane for her because we want to avoid steroids and of course with Abraxane and you do not require steroid premeds which you do require with Taxol. The plan is to get her  through 12 doses of Abraxane given weekly if possible. I did alert her to the possibility of peripheral neuropathy and we went over what the symptoms are that she might need to alert [name] about.  Otherwise she will start her Abraxane March 6. She will see me with the second dose, March 13, and I would see her every other treatment until she completes her therapy unless she develops any significant side effects before that.  She is of course on HIV medication and Bactrim in addition to Cipro. I think there may be some interaction with Diflucan but since she is only taking that for 3 days I expect that should not be a major problem.  She knows to call for any problems that may develop before her next visit here.  [name] Cruel, MD   03/19/2016 12:01 PM Medical Oncology and Hematology [facility] [address] [place], [place] [address] Tel. [phone]    Fax. [phone]

## 2016-03-19 NOTE — Telephone Encounter (Signed)
Gave patient avs report and appointments for March thru May. °

## 2016-03-19 NOTE — Progress Notes (Signed)
ON PATHWAY REGIMEN - Breast  No Change  Continue With Treatment as Ordered.  [id number]: AC-T - [Doxorubicin + Cyclophosphamide q21 Days x 4 Cycles, Followed by Paclitaxel Weekly x 12 Weeks]  Doxorubicin + Cyclophosphamide (AC):   A cycle is every 21 days:     Doxorubicin (Adriamycin(R)) 60 mg/m2 IV Push followed by       Dose Mod: None     Cyclophosphamide (Cytoxan(R)) 600 mg/m2 in 250 mL NS IV over 30 minutes       Dose Mod: None  **Always confirm dose/schedule in your pharmacy ordering system**    Paclitaxel 80 mg/m2 Weekly:   Administer weekly:     Paclitaxel (Taxol(R)) 80 mg/m2 in 250 mL NS IV over 1 hour       Dose Mod: None  **Always confirm dose/schedule in your pharmacy ordering system**    Patient Characteristics: Adjuvant Therapy, Node Negative, HER2/neu Negative/Unknown/Equivocal, ER Negative AJCC Stage Grouping: Unknown Current Disease Status: No Distant Mets or Local Recurrence AJCC M Stage: X ER Status: Negative (-) AJCC N Stage: 0 AJCC T Stage: 1c HER2/neu: Negative (-) PR Status: Negative (-) Node Status: Negative (-)  Intent of Therapy: Curative Intent, Discussed with Patient

## 2016-03-19 NOTE — Progress Notes (Signed)
DISCONTINUE OFF PATHWAY REGIMEN - Breast  [id number]: AC-T - [Doxorubicin + Cyclophosphamide q21 Days x 4 Cycles, Followed by Paclitaxel Weekly x 12 Weeks]  Doxorubicin + Cyclophosphamide (AC):   A cycle is every 21 days:     Doxorubicin (Adriamycin(R)) 60 mg/m2 IV Push followed by       Dose Mod: None     Cyclophosphamide (Cytoxan(R)) 600 mg/m2 in 250 mL NS IV over 30 minutes       Dose Mod: None  **Always confirm dose/schedule in your pharmacy ordering system**    Paclitaxel 80 mg/m2 Weekly:   Administer weekly:     Paclitaxel (Taxol(R)) 80 mg/m2 in 250 mL NS IV over 1 hour       Dose Mod: None  **Always confirm dose/schedule in your pharmacy ordering system**    REASON: Other Reason PRIOR TREATMENT: [id number]: AC-T - [Doxorubicin + Cyclophosphamide q21 Days x 4 Cycles, Followed by Paclitaxel Weekly x 12 Weeks] TREATMENT RESPONSE: Unable to Evaluate  START OFF PATHWAY REGIMEN - Breast  Off Pathway: Nab-Paclitaxel (Abraxane(R)) 100 mg/m2 D1,8,15 q28 days  [id number]:Nab-Paclitaxel (Abraxane(R)) 100 mg/m2 D1,8,15 q28 days:   A cycle is every 28 days (3 weeks on and 1 week off):     Nab-paclitaxel (protein bound) (Abraxane(R)) 100 mg/m2 in solution volume per manufacturer guidelines via IV over 30 minutes days 1, 8, 15 (3 weeks on followed by one week off)       Dose Mod: None  **Always confirm dose/schedule in your pharmacy ordering system**    Patient Characteristics: Neoadjuvant Chemotherapy, HER2/neu Negative/Unknown/Equivocal, ER Negative, Platinum Therapy Not Indicated AJCC Stage Grouping: Unknown Current Disease Status: No Distant Mets or Local Recurrence AJCC M Stage: X ER Status: Negative (-) AJCC N Stage: 0 AJCC T Stage: 1c HER2/neu: Negative (-) PR Status: Negative (-) Type of Therapy: Platinum Therapy Not Indicated  Intent of Therapy: Curative Intent, Discussed with Patient

## 2016-03-21 ENCOUNTER — Other Ambulatory Visit: Payer: Self-pay | Admitting: Oncology

## 2016-03-21 ENCOUNTER — Other Ambulatory Visit: Payer: Self-pay | Admitting: *Deleted

## 2016-03-28 ENCOUNTER — Other Ambulatory Visit: Payer: BLUE CROSS/BLUE SHIELD

## 2016-03-28 DIAGNOSIS — B20 Human immunodeficiency virus [HIV] disease: Secondary | ICD-10-CM

## 2016-03-28 LAB — COMPLETE METABOLIC PANEL WITH GFR
ALBUMIN: 4.2 g/dL (ref 3.6–5.1)
ALK PHOS: 70 U/L (ref 33–130)
ALT: 14 U/L (ref 6–29)
AST: 19 U/L (ref 10–35)
BILIRUBIN TOTAL: 0.3 mg/dL (ref 0.2–1.2)
BUN: 9 mg/dL (ref 7–25)
CALCIUM: 9.4 mg/dL (ref 8.6–10.4)
CO2: 29 mmol/L (ref 20–31)
CREATININE: 0.73 mg/dL (ref 0.50–0.99)
Chloride: 106 mmol/L (ref 98–110)
GFR, Est Non African American: 87 mL/min (ref >=60)
Glucose, Bld: 81 mg/dL (ref 65–99)
Potassium: 4.6 mmol/L (ref 3.5–5.3)
Sodium: 141 mmol/L (ref 135–146)
TOTAL PROTEIN: 6.4 g/dL (ref 6.1–8.1)

## 2016-03-28 LAB — CBC WITH DIFFERENTIAL/PLATELET
BASOS ABS: 56 {cells}/uL (ref 0–200)
BASOS PCT: 1 %
EOS ABS: 0 {cells}/uL — AB (ref 15–500)
Eosinophils Relative: 0 %
HCT: 32 % — ABNORMAL LOW (ref 35.0–45.0)
HEMOGLOBIN: 10.9 g/dL — AB (ref 11.7–15.5)
Lymphocytes Relative: 10 %
Lymphs Abs: 560 cells/uL — ABNORMAL LOW (ref 850–3900)
MCH: 32.9 pg (ref 27.0–33.0)
MCHC: 34.1 g/dL (ref 32.0–36.0)
MCV: 96.7 fL (ref 80.0–100.0)
MONO ABS: 952 {cells}/uL — AB (ref 200–950)
MPV: 8.7 fL (ref 7.5–12.5)
Monocytes Relative: 17 %
NEUTROS ABS: 4032 {cells}/uL (ref 1500–7800)
Neutrophils Relative %: 72 %
Platelets: 378 10*3/uL (ref 140–400)
RBC: 3.31 MIL/uL — ABNORMAL LOW (ref 3.80–5.10)
RDW: 17.8 % — ABNORMAL HIGH (ref 11.0–15.0)
WBC: 5.6 10*3/uL (ref 3.8–10.8)

## 2016-03-29 ENCOUNTER — Telehealth: Payer: Self-pay | Admitting: *Deleted

## 2016-03-29 LAB — T-HELPER CELL (CD4) - (RCID CLINIC ONLY)
CD4 % Helper T Cell: 30 % — ABNORMAL LOW (ref 33–55)
CD4 T CELL ABS: 210 /uL — AB (ref 400–2700)

## 2016-03-29 NOTE — Telephone Encounter (Signed)
"  I saw Dr. Jana Hakim.  I'm to begin a new treatment next Tuesday for A-B-R-A-X-A-N-E.  Has this been approved by my insurance?"   Appointment reads Abraxane plan has received authorization.  No further questions at this time.

## 2016-04-01 LAB — HIV-1 RNA QUANT-NO REFLEX-BLD
HIV 1 RNA QUANT: DETECTED {copies}/mL — AB
HIV-1 RNA QUANT, LOG: DETECTED {Log_copies}/mL — AB

## 2016-04-02 ENCOUNTER — Telehealth: Payer: Self-pay

## 2016-04-02 ENCOUNTER — Ambulatory Visit (HOSPITAL_BASED_OUTPATIENT_CLINIC_OR_DEPARTMENT_OTHER): Payer: BLUE CROSS/BLUE SHIELD

## 2016-04-02 ENCOUNTER — Other Ambulatory Visit (HOSPITAL_BASED_OUTPATIENT_CLINIC_OR_DEPARTMENT_OTHER): Payer: BLUE CROSS/BLUE SHIELD

## 2016-04-02 VITALS — BP 121/76 | HR 102 | Temp 98.3°F | Resp 18

## 2016-04-02 DIAGNOSIS — C50412 Malignant neoplasm of upper-outer quadrant of left female breast: Secondary | ICD-10-CM | POA: Diagnosis not present

## 2016-04-02 DIAGNOSIS — Z171 Estrogen receptor negative status [ER-]: Principal | ICD-10-CM

## 2016-04-02 DIAGNOSIS — Z5111 Encounter for antineoplastic chemotherapy: Secondary | ICD-10-CM | POA: Diagnosis not present

## 2016-04-02 DIAGNOSIS — B20 Human immunodeficiency virus [HIV] disease: Secondary | ICD-10-CM

## 2016-04-02 LAB — COMPREHENSIVE METABOLIC PANEL
ALT: 21 U/L (ref 0–55)
ANION GAP: 9 meq/L (ref 3–11)
AST: 26 U/L (ref 5–34)
Albumin: 4.2 g/dL (ref 3.5–5.0)
Alkaline Phosphatase: 92 U/L (ref 40–150)
BUN: 5.8 mg/dL — ABNORMAL LOW (ref 7.0–26.0)
CALCIUM: 9.7 mg/dL (ref 8.4–10.4)
CHLORIDE: 107 meq/L (ref 98–109)
CO2: 25 meq/L (ref 22–29)
Creatinine: 0.7 mg/dL (ref 0.6–1.1)
EGFR: 85 mL/min/{1.73_m2} — ABNORMAL LOW (ref >90)
Glucose: 83 mg/dl (ref 70–140)
POTASSIUM: 4.4 meq/L (ref 3.5–5.1)
Sodium: 141 mEq/L (ref 136–145)
Total Bilirubin: 0.41 mg/dL (ref 0.20–1.20)
Total Protein: 7.3 g/dL (ref 6.4–8.3)

## 2016-04-02 LAB — CBC WITH DIFFERENTIAL/PLATELET
BASO%: 0.8 % (ref 0.0–2.0)
BASOS ABS: 0.1 10*3/uL (ref 0.0–0.1)
EOS%: 0.1 % (ref 0.0–7.0)
Eosinophils Absolute: 0 10*3/uL (ref 0.0–0.5)
HEMATOCRIT: 35.9 % (ref 34.8–46.6)
HGB: 12.3 g/dL (ref 11.6–15.9)
LYMPH#: 0.9 10*3/uL (ref 0.9–3.3)
LYMPH%: 11 % — AB (ref 14.0–49.7)
MCH: 33.6 pg (ref 25.1–34.0)
MCHC: 34.3 g/dL (ref 31.5–36.0)
MCV: 98.1 fL (ref 79.5–101.0)
MONO#: 1.5 10*3/uL — ABNORMAL HIGH (ref 0.1–0.9)
MONO%: 18.6 % — ABNORMAL HIGH (ref 0.0–14.0)
NEUT#: 5.6 10*3/uL (ref 1.5–6.5)
NEUT%: 69.5 % (ref 38.4–76.8)
Platelets: 446 10*3/uL — ABNORMAL HIGH (ref 145–400)
RBC: 3.66 10*6/uL — AB (ref 3.70–5.45)
RDW: 17 % — ABNORMAL HIGH (ref 11.2–14.5)
WBC: 8 10*3/uL (ref 3.9–10.3)

## 2016-04-02 MED ORDER — HEPARIN SOD (PORK) LOCK FLUSH 100 UNIT/ML IV SOLN
500.0000 [IU] | Freq: Once | INTRAVENOUS | Status: AC | PRN
  Administered 2016-04-02: 500 [IU]
  Filled 2016-04-02: qty 5

## 2016-04-02 MED ORDER — SODIUM CHLORIDE 0.9 % IV SOLN
Freq: Once | INTRAVENOUS | Status: AC
  Administered 2016-04-02: 13:00:00 via INTRAVENOUS

## 2016-04-02 MED ORDER — SODIUM CHLORIDE 0.9% FLUSH
10.0000 mL | INTRAVENOUS | Status: DC | PRN
  Administered 2016-04-02: 10 mL
  Filled 2016-04-02: qty 10

## 2016-04-02 MED ORDER — PROCHLORPERAZINE MALEATE 10 MG PO TABS
ORAL_TABLET | ORAL | Status: AC
  Filled 2016-04-02: qty 1

## 2016-04-02 MED ORDER — PACLITAXEL PROTEIN-BOUND CHEMO INJECTION 100 MG
100.0000 mg/m2 | Freq: Once | INTRAVENOUS | Status: AC
  Administered 2016-04-02: 175 mg via INTRAVENOUS
  Filled 2016-04-02: qty 35

## 2016-04-02 MED ORDER — PROCHLORPERAZINE MALEATE 10 MG PO TABS
10.0000 mg | ORAL_TABLET | Freq: Once | ORAL | Status: AC
  Administered 2016-04-02: 10 mg via ORAL

## 2016-04-02 NOTE — Patient Instructions (Signed)

## 2016-04-09 ENCOUNTER — Ambulatory Visit (HOSPITAL_BASED_OUTPATIENT_CLINIC_OR_DEPARTMENT_OTHER): Payer: BLUE CROSS/BLUE SHIELD

## 2016-04-09 ENCOUNTER — Other Ambulatory Visit (HOSPITAL_BASED_OUTPATIENT_CLINIC_OR_DEPARTMENT_OTHER): Payer: BLUE CROSS/BLUE SHIELD

## 2016-04-09 ENCOUNTER — Ambulatory Visit (HOSPITAL_BASED_OUTPATIENT_CLINIC_OR_DEPARTMENT_OTHER): Payer: BLUE CROSS/BLUE SHIELD | Admitting: Oncology

## 2016-04-09 ENCOUNTER — Ambulatory Visit: Payer: BLUE CROSS/BLUE SHIELD | Admitting: Internal Medicine

## 2016-04-09 VITALS — BP 129/74 | HR 107 | Temp 98.2°F | Resp 17 | Ht 65.0 in | Wt 148.2 lb

## 2016-04-09 DIAGNOSIS — C50412 Malignant neoplasm of upper-outer quadrant of left female breast: Secondary | ICD-10-CM

## 2016-04-09 DIAGNOSIS — Z171 Estrogen receptor negative status [ER-]: Principal | ICD-10-CM

## 2016-04-09 DIAGNOSIS — B20 Human immunodeficiency virus [HIV] disease: Secondary | ICD-10-CM | POA: Diagnosis not present

## 2016-04-09 DIAGNOSIS — Z5111 Encounter for antineoplastic chemotherapy: Secondary | ICD-10-CM

## 2016-04-09 LAB — COMPREHENSIVE METABOLIC PANEL
ALT: 48 U/L (ref 0–55)
AST: 41 U/L — ABNORMAL HIGH (ref 5–34)
Albumin: 4.1 g/dL (ref 3.5–5.0)
Alkaline Phosphatase: 73 U/L (ref 40–150)
Anion Gap: 8 mEq/L (ref 3–11)
BUN: 11.8 mg/dL (ref 7.0–26.0)
CO2: 27 mEq/L (ref 22–29)
Calcium: 9.9 mg/dL (ref 8.4–10.4)
Chloride: 106 mEq/L (ref 98–109)
Creatinine: 0.8 mg/dL (ref 0.6–1.1)
EGFR: 73 mL/min/{1.73_m2} — ABNORMAL LOW (ref >90)
Glucose: 66 mg/dl — ABNORMAL LOW (ref 70–140)
Potassium: 4.6 mEq/L (ref 3.5–5.1)
Sodium: 141 mEq/L (ref 136–145)
Total Bilirubin: 0.36 mg/dL (ref 0.20–1.20)
Total Protein: 7 g/dL (ref 6.4–8.3)

## 2016-04-09 LAB — CBC WITH DIFFERENTIAL/PLATELET
BASO%: 3.6 % — ABNORMAL HIGH (ref 0.0–2.0)
BASOS ABS: 0.2 10*3/uL — AB (ref 0.0–0.1)
EOS ABS: 0.1 10*3/uL (ref 0.0–0.5)
EOS%: 1.2 % (ref 0.0–7.0)
HEMATOCRIT: 34.4 % — AB (ref 34.8–46.6)
HEMOGLOBIN: 11.7 g/dL (ref 11.6–15.9)
LYMPH#: 0.7 10*3/uL — AB (ref 0.9–3.3)
LYMPH%: 15.9 % (ref 14.0–49.7)
MCH: 33.8 pg (ref 25.1–34.0)
MCHC: 34 g/dL (ref 31.5–36.0)
MCV: 99.3 fL (ref 79.5–101.0)
MONO#: 0.5 10*3/uL (ref 0.1–0.9)
MONO%: 11.6 % (ref 0.0–14.0)
NEUT%: 67.7 % (ref 38.4–76.8)
NEUTROS ABS: 2.9 10*3/uL (ref 1.5–6.5)
PLATELETS: 392 10*3/uL (ref 145–400)
RBC: 3.47 10*6/uL — ABNORMAL LOW (ref 3.70–5.45)
RDW: 17 % — AB (ref 11.2–14.5)
WBC: 4.2 10*3/uL (ref 3.9–10.3)

## 2016-04-09 MED ORDER — SODIUM CHLORIDE 0.9% FLUSH
10.0000 mL | INTRAVENOUS | Status: DC | PRN
  Administered 2016-04-09: 10 mL
  Filled 2016-04-09: qty 10

## 2016-04-09 MED ORDER — PACLITAXEL PROTEIN-BOUND CHEMO INJECTION 100 MG
100.0000 mg/m2 | Freq: Once | INTRAVENOUS | Status: AC
  Administered 2016-04-09: 175 mg via INTRAVENOUS
  Filled 2016-04-09: qty 35

## 2016-04-09 MED ORDER — PROCHLORPERAZINE MALEATE 10 MG PO TABS
10.0000 mg | ORAL_TABLET | Freq: Once | ORAL | Status: AC
Start: 2016-04-09 — End: 2016-04-09
  Administered 2016-04-09: 10 mg via ORAL

## 2016-04-09 MED ORDER — PROCHLORPERAZINE MALEATE 10 MG PO TABS
ORAL_TABLET | ORAL | Status: AC
  Filled 2016-04-09: qty 1

## 2016-04-09 MED ORDER — SODIUM CHLORIDE 0.9 % IV SOLN
Freq: Once | INTRAVENOUS | Status: AC
  Administered 2016-04-09: 12:00:00 via INTRAVENOUS

## 2016-04-09 MED ORDER — HEPARIN SOD (PORK) LOCK FLUSH 100 UNIT/ML IV SOLN
500.0000 [IU] | Freq: Once | INTRAVENOUS | Status: AC | PRN
  Administered 2016-04-09: 500 [IU]
  Filled 2016-04-09: qty 5

## 2016-04-09 NOTE — Patient Instructions (Signed)
[facility] Discharge Instructions for Patients Receiving Chemotherapy  Today you received the following chemotherapy agents:  Abraxane (paclitaxel protein bound)  To help prevent nausea and vomiting after your treatment, we encourage you to take your nausea medication as prescribed.   If you develop nausea and vomiting that is not controlled by your nausea medication, call the clinic.   BELOW ARE SYMPTOMS THAT SHOULD BE REPORTED IMMEDIATELY:  *FEVER GREATER THAN 100.5 F  *CHILLS WITH OR WITHOUT FEVER  NAUSEA AND VOMITING THAT IS NOT CONTROLLED WITH YOUR NAUSEA MEDICATION  *UNUSUAL SHORTNESS OF BREATH  *UNUSUAL BRUISING OR BLEEDING  TENDERNESS IN MOUTH AND THROAT WITH OR WITHOUT PRESENCE OF ULCERS  *URINARY PROBLEMS  *BOWEL PROBLEMS  UNUSUAL RASH Items with * indicate a potential emergency and should be followed up as soon as possible.  Feel free to call the clinic you have any questions or concerns. The clinic phone number is [phone].  Please show the [facility] at check-in to the Emergency Department and triage nurse.

## 2016-04-09 NOTE — Progress Notes (Signed)
[place]  [phone] [phone]     ID: [name] DOB: 1950/04/03  MR#: [id number]  UGQ#:[id number]  Patient Care Team: Carlyle Basques, MD as PCP - General (Infectious Diseases) Carlyle Basques, MD as PCP - Infectious Diseases (Infectious Diseases) Alphonsa Overall, MD as Consulting Physician (General Surgery) Chauncey Cruel, MD as Consulting Physician (Oncology) Gery Pray, MD as Consulting Physician (Radiation Oncology) Marcial Pacas, MD as Consulting Physician (Neurology) Druscilla Brownie, MD as Consulting Physician (Dermatology) Chauncey Cruel, MD OTHER MD:  CHIEF COMPLAINT: Triple negative breast cancer  CURRENT TREATMENT: Adjuvant chemotherapy   BREAST CANCER HISTORY:  From the origil intake note:  [name] herself palpated a mass in the upper outer quadrant of her left breast the first week in November, and had bilateral diagnostic mammography with tomography and left breast ultrasonography at the [facility] 12/05/2015. The breast density was category C. In the upper outer left breast there was  A mass with macro lobulated contours which was barely palpable at the 12:30 o'clock position of the left breast 7 cm from the nipple. There was no palpable axillary adenopathy. Ultrasonography confirmed a 2.1 cm obliquely oriented hypoechoic mass in the area in question. Ultrasound of the left axilla was benign.  Biopsy of the left breast mass in question 12/07/2015 showed ([id number]) invasive ductal carcinoma, grade 3, estrogen and progesterone receptor negative, with an MIB-1 of 35%, and no HER-2 amplification, the signals ratio being 0.95 and the number per cell 2.80.  Her subsequent history is as detailed below  INTERVAL HISTORY: [name] returns today for follow-up of her triple negative breast cancer accompanied by her son. Today is day 1 cycle 2 of 12 planned weekly cycles of Abraxane. [name] did generally well with the first cycle,  with no nausea or vomiting problems and no mouth sores. Her port worked well although [name] has noted is slight scab in 1 core neuropathy that [name] wants me to look at. [name] tells me her energy is better with this chemotherapy [name] has more appetite and a very sensitive taste is better. The only side effect [name] noted of concern was that her hands became flushed and read at home the day of treatment. His lasted for about an hour. [name] also had significant joint pain particularly involving the neck and lower back but also her hips. This lasted 1 or 2 days.  REVIEW OF SYSTEMS: Aside from the problems just noted, a detailed review of systems today was benign. In particular [name] has had actually no peripheral neuropathy symptoms.  PAST MEDICAL HISTORY: Past Medical History:  Diagnosis Date  . HIV (human immunodeficiency virus infection) ([place])   . Hypertension   . Malignant neoplasm of upper-outer quadrant of left female breast ([place]) 12/11/2015  . Migraine   . Neuropathy ([place])   . PONV (postoperative nausea and vomiting)    said usually has to have scop patch  . Rash and nonspecific skin eruption 10/16/2015  . Spinal stenosis     PAST SURGICAL HISTORY: Past Surgical History:  Procedure Laterality Date  . ABDOMINAL HYSTERECTOMY  1990  . BREAST LUMPECTOMY WITH RADIOACTIVE SEED AND SENTINEL LYMPH NODE BIOPSY Left 01/01/2016   Procedure: LEFT BREAST LUMPECTOMY WITH RADIOACTIVE SEED AND SENTINEL LYMPH NODE BIOPSY;  Surgeon: Alphonsa Overall, MD;  Location: [facility];  Service: General;  Laterality: Left;  . CARPAL TUNNEL RELEASE     both hands  . CERVICAL FUSION  1989  . PORTACATH PLACEMENT Right 01/01/2016  Procedure: INSERTION PORT-A-CATH WITH [name];  Surgeon: Alphonsa Overall, MD;  Location: [facility];  Service: General;  Laterality: Right;  . TONSILLECTOMY      FAMILY HISTORY Family History  Problem Relation Age of Onset  . High blood pressure Mother   . High Cholesterol  Mother   . Cancer Father   . High blood pressure Father   . High Cholesterol Father   . Lung cancer Maternal Grandfather   . Breast cancer Maternal Aunt 34  The patient's father died from heart disease at the age of 2. The patient's mother died from "genetic cirrhosis" at the age of 38. The patient had 2 brothers, 5 sisters. A maternal aunt was diagnosed with breast cancer at age 40. There is no other history of breast or ovarian cancer in the family to the patient's knowledge   GYNECOLOGIC HISTORY:  No LMP recorded. Patient has had a hysterectomy. Menarche age 14, first live birth age 70. Patient is GX P1. [name] underwent hysterectomy with bilateral salpingo-oophorectomy at age 30. [name] took hormone replacement for approximately 10 years, until age 21. [name] also had oral contraceptives remotely for 8-10 years, with no complications.   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 At home is just [name] and her husband [name] who is retired from Teaching laboratory technician work. Their son [name] works as a Chief Strategy Officer for rest home in [place]. The patient has 3 grandchildren. [name] is not a Ambulance person.    ADVANCED DIRECTIVES: The patient has a living will in place   HEALTH MAINTENANCE: Social History  Substance Use Topics  . Smoking status: Never Smoker  . Smokeless tobacco: Never Used  . Alcohol use 0.6 oz/week    1 Glasses of wine per week     Comment: on weekends     Colonoscopy: 2012/be routinely  PAP: Status post hysterectomy  Bone density: Never   No Known Allergies  Current Outpatient Prescriptions  Medication Sig Dispense Refill  . amLODipine (NORVASC) 5 MG tablet Take 1 tablet (5 mg total) by mouth daily. 30 tablet 5  . benazepril (LOTENSIN) 40 MG tablet Take 1 tablet (40 mg total) by mouth daily. 30 tablet 5  . ciprofloxacin (CIPRO) 500 MG tablet Take 1 tablet (500 mg total) by mouth 2 (two) times daily. 30 tablet 0  . Coenzyme Q10 300 MG CAPS Take 300  capsules by mouth daily.    . cyclobenzaprine (FLEXERIL) 10 MG tablet Take 1 tablet (10 mg total) by mouth at bedtime. 90 tablet 1  . dolutegravir (TIVICAY) 50 MG tablet Take 1 tablet (50 mg total) by mouth daily. 30 tablet 5  . emtricitabine-tenofovir AF (DESCOVY) 200-25 MG tablet Take 1 tablet by mouth daily. 30 tablet 5  . fluconazole (DIFLUCAN) 100 MG tablet Take 1 tablet (100 mg total) by mouth daily. 10 tablet 0  . flunisolide (NASALIDE) 25 MCG/ACT (0.025%) SOLN Place 2 sprays into the nose 2 (two) times daily. 1 Bottle 3  . gabapentin (NEURONTIN) 100 MG capsule Take 1 capsule (100 mg total) by mouth 2 (two) times daily. 180 capsule 1  . nystatin (MYCOSTATIN) 100000 UNIT/ML suspension Take 5 mLs (500,000 Units total) by mouth 4 (four) times daily. 473 mL 0  . Omega-3 Fatty Acids (FISH OIL) 1200 MG CAPS Take 1,200 mg by mouth 2 (two) times daily.      . prochlorperazine (COMPAZINE) 10 MG tablet Take 1 tablet (10 mg total) by mouth every 6 (  six) hours as needed (Nausea or vomiting). 30 tablet 1  . rosuvastatin (CRESTOR) 5 MG tablet Take 1 tablet (5 mg total) by mouth at bedtime. 30 tablet 5  . sulfamethoxazole-trimethoprim (BACTRIM,SEPTRA) 400-80 MG tablet TAKE ONE TABLET BY MOUTH EVERY DAY 30 tablet 2   No current facility-administered medications for this visit.     OBJECTIVE: Middle-aged white womanIn no acute distress  Vitals:   04/09/16 1136  BP: 129/74  Pulse: (!) 107  Resp: 17  Temp: 98.2 F (36.8 C)     Body mass index is 24.66 kg/m.    ECOG FS:1 - Symptomatic but completely ambulatory  Sclerae unicteric, pupils round and equal Oropharynx shows minimal thrush No cervical or supraclavicular adenopathy Lungs no rales or rhonchi Heart regular rate and rhythm Abd soft, nontender, positive bowel sounds MSK no focal spinal tenderness, no upper extremity lymphedema Neuro: nonfocal, well oriented, appropriate affect Breasts: The right breast is unremarkable. The left breast is  undergone lumpectomy, with no evidence of local recurrence. Both axillae are benign.    LAB RESULTS:  CMP     Component Value Date/Time   NA 141 04/09/2016 1120   K 4.6 04/09/2016 1120   CL 106 03/28/2016 1139   CO2 27 04/09/2016 1120   GLUCOSE 66 (L) 04/09/2016 1120   BUN 11.8 04/09/2016 1120   CREATININE 0.8 04/09/2016 1120   CALCIUM 9.9 04/09/2016 1120   PROT 7.0 04/09/2016 1120   ALBUMIN 4.1 04/09/2016 1120   AST 41 (H) 04/09/2016 1120   ALT 48 04/09/2016 1120   ALKPHOS 73 04/09/2016 1120   BILITOT 0.36 04/09/2016 1120   GFRNONAA 87 03/28/2016 1139   GFRAA >89 03/28/2016 1139    INo results found for: SPEP, UPEP  Lab Results  Component Value Date   WBC 4.2 04/09/2016   NEUTROABS 2.9 04/09/2016   HGB 11.7 04/09/2016   HCT 34.4 (L) 04/09/2016   MCV 99.3 04/09/2016   PLT 392 04/09/2016      Chemistry      Component Value Date/Time   NA 141 04/09/2016 1120   K 4.6 04/09/2016 1120   CL 106 03/28/2016 1139   CO2 27 04/09/2016 1120   BUN 11.8 04/09/2016 1120   CREATININE 0.8 04/09/2016 1120      Component Value Date/Time   CALCIUM 9.9 04/09/2016 1120   ALKPHOS 73 04/09/2016 1120   AST 41 (H) 04/09/2016 1120   ALT 48 04/09/2016 1120   BILITOT 0.36 04/09/2016 1120       No results found for: LABCA2  No components found for: LABCA125  No results for input(s): INR in the last 168 hours.  Urinalysis    Component Value Date/Time   COLORURINE YELLOW 03/21/2007 1108   APPEARANCEUR CLEAR 03/21/2007 1108   LABSPEC 1.021 03/21/2007 1108   PHURINE 7.0 03/21/2007 1108   GLUCOSEU 100 (A) 03/21/2007 1108   HGBUR NEGATIVE 03/21/2007 1108   BILIRUBINUR NEGATIVE 03/21/2007 1108   KETONESUR NEGATIVE 03/21/2007 1108   PROTEINUR NEGATIVE 03/21/2007 1108   UROBILINOGEN 1.0 03/21/2007 1108   NITRITE NEGATIVE 03/21/2007 1108   LEUKOCYTESUR SMALL (A) 03/21/2007 1108     STUDIES: No results found.  ELIGIBLE FOR AVAILABLE RESEARCH PROTOCOL: [facility]  [phone]  ASSESSMENT: 66 y.o. [place] woman status post left breast upper outer quadrant biopsy 12/07/2015 for a clinical T2 N0, stage IIA invasive ductal carcinoma, grade 3, triple negative, with an MIB-1 of 35%  (1) genetics testing 01/23/2016  (a) alpha - anti trypsin and  ceruloplamin labs drawn 01/19/2016, both normal  (2) Status postft lumpectomy/ sentinel lymph node sampling 01/01/2016 for a pT1c pN0, stage IA  Invasive ductal carcinoma, grade 3, with negative margins.  (3) Mammaprint sent from the patient's 12/07/2015 biopsy returned "high risk", predicting a risk of recurrence of nearly 30% untreated, decreasing to a 93% chance of five-year distant metastasis free survival with chemotherapy.  (4) adjuvant chemotherapy consisting of cyclophosphamide and doxorubicin in dose dense fashion 4 started01/04/2016, completed 03/12/2016, followed by weekly Abraxane 12 starting 04/02/2016  (5) adjuvant radiation to follow  PLAN: [name] did generally well with her first dose of Abraxane. [name] did have significant arthralgias and myalgias and this is a first pass symptom that we sometimes see. Usually it is not orally minimally present the second dose and not at all by the third dose.  The flushing [name] had on her hands may have been due to some of the premeds. If they recur [name] may take some Benadryl.  I suggested [name] go back to her nystatin swish and swallow to prevent problems with thrush.  [name] was concerned that [name] should not be taking naproxen which is what [name] usually does take for pain. I suggest that it is safe for her to take some if [name] needs it. Hopefully though [name] will not have the same aches and pains that [name] did have the first cycle.  We are going to start seeing her with every other treatment. However [name] will let us know if any peripheral neuropathy develops. So far that has not happened.  [name] knows to call for any problems that may develop before her next visit  here Chauncey Cruel, MD   04/09/2016 11:51 AM Medical Oncology and Hematology [facility] [place], [place] [address] Tel. [phone]    Fax. [phone]

## 2016-04-11 ENCOUNTER — Ambulatory Visit (INDEPENDENT_AMBULATORY_CARE_PROVIDER_SITE_OTHER): Payer: BLUE CROSS/BLUE SHIELD | Admitting: Internal Medicine

## 2016-04-11 ENCOUNTER — Encounter: Payer: Self-pay | Admitting: Internal Medicine

## 2016-04-11 VITALS — BP 145/85 | HR 109 | Temp 98.3°F | Ht 65.0 in | Wt 147.0 lb

## 2016-04-11 DIAGNOSIS — D701 Agranulocytosis secondary to cancer chemotherapy: Secondary | ICD-10-CM

## 2016-04-11 DIAGNOSIS — B20 Human immunodeficiency virus [HIV] disease: Secondary | ICD-10-CM | POA: Diagnosis not present

## 2016-04-11 DIAGNOSIS — M898X9 Other specified disorders of bone, unspecified site: Secondary | ICD-10-CM | POA: Diagnosis not present

## 2016-04-11 DIAGNOSIS — C50912 Malignant neoplasm of unspecified site of left female breast: Secondary | ICD-10-CM

## 2016-04-11 DIAGNOSIS — T451X5A Adverse effect of antineoplastic and immunosuppressive drugs, initial encounter: Secondary | ICD-10-CM

## 2016-04-11 MED ORDER — CYCLOBENZAPRINE HCL 10 MG PO TABS: 10.0000 mg | ORAL_TABLET | Freq: Three times a day (TID) | ORAL | 3 refills | Status: DC | PRN

## 2016-04-11 NOTE — Progress Notes (Signed)
RFV: follow up for HIV disease  Patient ID: [name], female   DOB: 01-18-1951, 66 y.o.   MRN: [id number]  HPI 66yo F with well controled HIV disease, cd 4 count of 210/VL <20, (march 2018), currently descovy/tivicay, Htn, breast cancer. She underwent 2nd of 12 weekly cycles of abraxane this week. She tolerated the first abraxane injection last week though had flushing of hands and significant bone and joint pain to neck and back for roughly 2 days. She states that she has increased her flexeril to bid which has been helpful. She has stopped taking naproxen as directed per dr Jana Hakim but has now said it is ok to restart. They advised her to look out for peripheral neuropathy as further side effects  I have reviewed her [facility] records    Outpatient Encounter Prescriptions as of 04/11/2016  Medication Sig  . amLODipine (NORVASC) 5 MG tablet Take 1 tablet (5 mg total) by mouth daily.  . benazepril (LOTENSIN) 40 MG tablet Take 1 tablet (40 mg total) by mouth daily.  . ciprofloxacin (CIPRO) 500 MG tablet Take 1 tablet (500 mg total) by mouth 2 (two) times daily.  . Coenzyme Q10 300 MG CAPS Take 300 capsules by mouth daily.  . cyclobenzaprine (FLEXERIL) 10 MG tablet Take 1 tablet (10 mg total) by mouth at bedtime.  . dolutegravir (TIVICAY) 50 MG tablet Take 1 tablet (50 mg total) by mouth daily.  Marland Kitchen emtricitabine-tenofovir AF (DESCOVY) 200-25 MG tablet Take 1 tablet by mouth daily.  . fluconazole (DIFLUCAN) 100 MG tablet Take 1 tablet (100 mg total) by mouth daily.  . flunisolide (NASALIDE) 25 MCG/ACT (0.025%) SOLN Place 2 sprays into the nose 2 (two) times daily.  Marland Kitchen gabapentin (NEURONTIN) 100 MG capsule Take 1 capsule (100 mg total) by mouth 2 (two) times daily.  Marland Kitchen nystatin (MYCOSTATIN) 100000 UNIT/ML suspension Take 5 mLs (500,000 Units total) by mouth 4 (four) times daily.  . Omega-3 Fatty Acids (FISH OIL) 1200 MG CAPS Take 1,200 mg by mouth 2 (two) times daily.    .  prochlorperazine (COMPAZINE) 10 MG tablet Take 1 tablet (10 mg total) by mouth every 6 (six) hours as needed (Nausea or vomiting).  . rosuvastatin (CRESTOR) 5 MG tablet Take 1 tablet (5 mg total) by mouth at bedtime.  . sulfamethoxazole-trimethoprim (BACTRIM,SEPTRA) 400-80 MG tablet TAKE ONE TABLET BY MOUTH EVERY DAY   No facility-administered encounter medications on file as of 04/11/2016.      Patient Active Problem List   Diagnosis Date Noted  . Malignant neoplasm of upper-outer quadrant of left breast in female, estrogen receptor negative ([place]) 12/11/2015  . Rash and nonspecific skin eruption 10/16/2015  . Hereditary and idiopathic peripheral neuropathy 07/16/2012  . Lumbar neuritis 07/16/2012  . Cervicalgia 07/16/2012  . URI 12/02/2006  . Human immunodeficiency virus (HIV) disease ([place]) 11/08/2005  . HYPERLIPIDEMIA 11/08/2005  . Essential hypertension 11/08/2005  . BRONCHITIS 11/08/2005  . Regional enteritis ([place]) 11/08/2005  . SYMPTOM, PAINFUL RESPIRATION 11/08/2005     Health Maintenance Due  Topic Date Due  . COLONOSCOPY  11/20/2001  . PAP SMEAR  12/17/2010    Social History  Substance Use Topics  . Smoking status: Never Smoker  . Smokeless tobacco: Never Used  . Alcohol use 0.6 oz/week    1 Glasses of wine per week     Comment: on weekends   Review of Systems Per hpi, otherwise 10 point ros is negative Physical Exam  BP (!) 145/85  Pulse (!) 109   Temp 98.3 F (36.8 C) (Oral)   Ht 5\' 5"  (1.651 m)   Wt 147 lb (66.7 kg)   BMI 24.46 kg/m  Physical Exam  Constitutional:  oriented to person, place, and time. appears well-developed and well-nourished. No distress.  HENT: [place]/AT, PERRLA, no scleral icterus Mouth/Throat: Oropharynx is clear and moist. No oropharyngeal exudate.  Cardiovascular: Normal rate, regular rhythm and normal heart sounds. Exam reveals no gallop and no friction rub.  No murmur heard.  Pulmonary/Chest: Effort normal and breath sounds normal.  No respiratory distress.  has no wheezes.  Neck = supple, no nuchal rigidity Lymphadenopathy: no cervical adenopathy. No axillary adenopathy Neurological: alert and oriented to person, place, and time.  Skin: Skin is warm and dry. No rash noted. No erythema.  Psychiatric: a normal mood and affect.  behavior is normal.    Lab Results  Component Value Date   CD4TCELL 30 (L) 03/28/2016   Lab Results  Component Value Date   CD4TABS 210 (L) 03/28/2016   CD4TABS 900 02/01/2016   CD4TABS 890 10/16/2015   Lab Results  Component Value Date   HIV1RNAQUANT <20 DETECTED (A) 03/28/2016   Lab Results  Component Value Date   HEPBSAB No 03/24/2006   Lab Results  Component Value Date   LABRPR NON REAC 10/16/2015    CBC Lab Results  Component Value Date   WBC 4.2 04/09/2016   RBC 3.47 (L) 04/09/2016   HGB 11.7 04/09/2016   HCT 34.4 (L) 04/09/2016   PLT 392 04/09/2016   MCV 99.3 04/09/2016   MCH 33.8 04/09/2016   MCHC 34.0 04/09/2016   RDW 17.0 (H) 04/09/2016   LYMPHSABS 0.7 (L) 04/09/2016   MONOABS 0.5 04/09/2016   EOSABS 0.1 04/09/2016    BMET Lab Results  Component Value Date   NA 141 04/09/2016   K 4.6 04/09/2016   CL 106 03/28/2016   CO2 27 04/09/2016   GLUCOSE 66 (L) 04/09/2016   BUN 11.8 04/09/2016   CREATININE 0.8 04/09/2016   CALCIUM 9.9 04/09/2016   GFRNONAA 87 03/28/2016   GFRAA >89 03/28/2016      Assessment and Plan  hiv disease = well controlled. Cd 4 count lower likely as result of chemotherapy. Will keep her on bactrim proph for 3 months and repeat cd 4 count in 3 months  Breast ca = she is currently on 2nd of 12 weekly cycles  Bone/joint pain sequelae = will give rx for flexeril 10 tid x 3 months

## 2016-04-16 ENCOUNTER — Ambulatory Visit (HOSPITAL_BASED_OUTPATIENT_CLINIC_OR_DEPARTMENT_OTHER): Payer: BLUE CROSS/BLUE SHIELD

## 2016-04-16 ENCOUNTER — Encounter: Payer: Self-pay | Admitting: *Deleted

## 2016-04-16 ENCOUNTER — Other Ambulatory Visit (HOSPITAL_BASED_OUTPATIENT_CLINIC_OR_DEPARTMENT_OTHER): Payer: BLUE CROSS/BLUE SHIELD

## 2016-04-16 VITALS — BP 132/71 | HR 92 | Temp 97.8°F | Resp 18

## 2016-04-16 DIAGNOSIS — Z5111 Encounter for antineoplastic chemotherapy: Secondary | ICD-10-CM | POA: Diagnosis not present

## 2016-04-16 DIAGNOSIS — C50412 Malignant neoplasm of upper-outer quadrant of left female breast: Secondary | ICD-10-CM

## 2016-04-16 DIAGNOSIS — B20 Human immunodeficiency virus [HIV] disease: Secondary | ICD-10-CM

## 2016-04-16 DIAGNOSIS — Z171 Estrogen receptor negative status [ER-]: Principal | ICD-10-CM

## 2016-04-16 LAB — CBC WITH DIFFERENTIAL/PLATELET
BASO%: 4 % — ABNORMAL HIGH (ref 0.0–2.0)
BASOS ABS: 0.1 10*3/uL (ref 0.0–0.1)
EOS ABS: 0.2 10*3/uL (ref 0.0–0.5)
EOS%: 7.6 % — ABNORMAL HIGH (ref 0.0–7.0)
HEMATOCRIT: 31.3 % — AB (ref 34.8–46.6)
HEMOGLOBIN: 10.9 g/dL — AB (ref 11.6–15.9)
LYMPH%: 25.4 % (ref 14.0–49.7)
MCH: 34.5 pg — AB (ref 25.1–34.0)
MCHC: 34.8 g/dL (ref 31.5–36.0)
MCV: 99.2 fL (ref 79.5–101.0)
MONO#: 0.3 10*3/uL (ref 0.1–0.9)
MONO%: 11.2 % (ref 0.0–14.0)
NEUT%: 51.8 % (ref 38.4–76.8)
NEUTROS ABS: 1.4 10*3/uL — AB (ref 1.5–6.5)
PLATELETS: 298 10*3/uL (ref 145–400)
RBC: 3.16 10*6/uL — ABNORMAL LOW (ref 3.70–5.45)
RDW: 16.7 % — AB (ref 11.2–14.5)
WBC: 2.6 10*3/uL — AB (ref 3.9–10.3)
lymph#: 0.7 10*3/uL — ABNORMAL LOW (ref 0.9–3.3)

## 2016-04-16 LAB — COMPREHENSIVE METABOLIC PANEL
ALBUMIN: 4.1 g/dL (ref 3.5–5.0)
ALK PHOS: 73 U/L (ref 40–150)
ALT: 52 U/L (ref 0–55)
ANION GAP: 8 meq/L (ref 3–11)
AST: 45 U/L — ABNORMAL HIGH (ref 5–34)
BILIRUBIN TOTAL: 0.41 mg/dL (ref 0.20–1.20)
BUN: 6.3 mg/dL — ABNORMAL LOW (ref 7.0–26.0)
CALCIUM: 9.6 mg/dL (ref 8.4–10.4)
CO2: 27 mEq/L (ref 22–29)
Chloride: 108 mEq/L (ref 98–109)
Creatinine: 0.8 mg/dL (ref 0.6–1.1)
EGFR: 78 mL/min/{1.73_m2} — AB (ref >90)
GLUCOSE: 82 mg/dL (ref 70–140)
POTASSIUM: 4.2 meq/L (ref 3.5–5.1)
Sodium: 143 mEq/L (ref 136–145)
TOTAL PROTEIN: 6.8 g/dL (ref 6.4–8.3)

## 2016-04-16 MED ORDER — SODIUM CHLORIDE 0.9 % IV SOLN
Freq: Once | INTRAVENOUS | Status: AC
  Administered 2016-04-16: 11:00:00 via INTRAVENOUS

## 2016-04-16 MED ORDER — SODIUM CHLORIDE 0.9% FLUSH
10.0000 mL | INTRAVENOUS | Status: DC | PRN
  Administered 2016-04-16: 10 mL
  Filled 2016-04-16: qty 10

## 2016-04-16 MED ORDER — PROCHLORPERAZINE MALEATE 10 MG PO TABS
ORAL_TABLET | ORAL | Status: AC
  Filled 2016-04-16: qty 1

## 2016-04-16 MED ORDER — PACLITAXEL PROTEIN-BOUND CHEMO INJECTION 100 MG
100.0000 mg/m2 | Freq: Once | INTRAVENOUS | Status: AC
  Administered 2016-04-16: 175 mg via INTRAVENOUS
  Filled 2016-04-16: qty 35

## 2016-04-16 MED ORDER — PROCHLORPERAZINE MALEATE 10 MG PO TABS
10.0000 mg | ORAL_TABLET | Freq: Once | ORAL | Status: AC
  Administered 2016-04-16: 10 mg via ORAL

## 2016-04-16 MED ORDER — HEPARIN SOD (PORK) LOCK FLUSH 100 UNIT/ML IV SOLN
500.0000 [IU] | Freq: Once | INTRAVENOUS | Status: AC | PRN
  Administered 2016-04-16: 500 [IU]
  Filled 2016-04-16: qty 5

## 2016-04-16 NOTE — Patient Instructions (Signed)
[facility] Discharge Instructions for Patients Receiving Chemotherapy  Today you received the following chemotherapy agents: Abraxane.   To help prevent nausea and vomiting after your treatment, we encourage you to take your nausea medication: Compazine. Take one every 6 hours as needed.  If you develop nausea and vomiting that is not controlled by your nausea medication, call the clinic.   BELOW ARE SYMPTOMS THAT SHOULD BE REPORTED IMMEDIATELY:  *FEVER GREATER THAN 100.5 F  *CHILLS WITH OR WITHOUT FEVER  NAUSEA AND VOMITING THAT IS NOT CONTROLLED WITH YOUR NAUSEA MEDICATION  *UNUSUAL SHORTNESS OF BREATH  *UNUSUAL BRUISING OR BLEEDING  TENDERNESS IN MOUTH AND THROAT WITH OR WITHOUT PRESENCE OF ULCERS  *URINARY PROBLEMS  *BOWEL PROBLEMS  UNUSUAL RASH Items with * indicate a potential emergency and should be followed up as soon as possible.  Feel free to call the clinic should you have any questions or concerns. The clinic phone number is [phone].  Please show the CHEMO ALERT CARD at check-in to the Emergency Department and triage nurse.   

## 2016-04-16 NOTE — Progress Notes (Signed)
68  -   Dr. Jana Hakim notified of lab results today.  OK to treat as per Dr. Jana Hakim.

## 2016-04-23 ENCOUNTER — Ambulatory Visit: Payer: BLUE CROSS/BLUE SHIELD

## 2016-04-23 ENCOUNTER — Other Ambulatory Visit (HOSPITAL_BASED_OUTPATIENT_CLINIC_OR_DEPARTMENT_OTHER): Payer: BLUE CROSS/BLUE SHIELD

## 2016-04-23 ENCOUNTER — Encounter: Payer: Self-pay | Admitting: Adult Health

## 2016-04-23 ENCOUNTER — Ambulatory Visit (HOSPITAL_BASED_OUTPATIENT_CLINIC_OR_DEPARTMENT_OTHER): Payer: BLUE CROSS/BLUE SHIELD | Admitting: Adult Health

## 2016-04-23 VITALS — BP 125/71 | HR 97 | Temp 97.8°F | Resp 20 | Ht 65.0 in | Wt 149.4 lb

## 2016-04-23 DIAGNOSIS — D701 Agranulocytosis secondary to cancer chemotherapy: Secondary | ICD-10-CM

## 2016-04-23 DIAGNOSIS — C50412 Malignant neoplasm of upper-outer quadrant of left female breast: Secondary | ICD-10-CM | POA: Diagnosis not present

## 2016-04-23 DIAGNOSIS — Z171 Estrogen receptor negative status [ER-]: Secondary | ICD-10-CM

## 2016-04-23 DIAGNOSIS — D702 Other drug-induced agranulocytosis: Secondary | ICD-10-CM | POA: Insufficient documentation

## 2016-04-23 LAB — COMPREHENSIVE METABOLIC PANEL
ALBUMIN: 4.2 g/dL (ref 3.5–5.0)
ALK PHOS: 74 U/L (ref 40–150)
ALT: 58 U/L — ABNORMAL HIGH (ref 0–55)
AST: 49 U/L — AB (ref 5–34)
Anion Gap: 10 mEq/L (ref 3–11)
BUN: 12.1 mg/dL (ref 7.0–26.0)
CHLORIDE: 107 meq/L (ref 98–109)
CO2: 25 meq/L (ref 22–29)
Calcium: 9.9 mg/dL (ref 8.4–10.4)
Creatinine: 0.8 mg/dL (ref 0.6–1.1)
EGFR: 77 mL/min/{1.73_m2} — AB (ref >90)
GLUCOSE: 64 mg/dL — AB (ref 70–140)
POTASSIUM: 3.7 meq/L (ref 3.5–5.1)
SODIUM: 142 meq/L (ref 136–145)
Total Bilirubin: 0.42 mg/dL (ref 0.20–1.20)
Total Protein: 6.9 g/dL (ref 6.4–8.3)

## 2016-04-23 LAB — CBC WITH DIFFERENTIAL/PLATELET
BASO%: 3.8 % — ABNORMAL HIGH (ref 0.0–2.0)
BASOS ABS: 0.1 10*3/uL (ref 0.0–0.1)
EOS ABS: 0.1 10*3/uL (ref 0.0–0.5)
EOS%: 5.5 % (ref 0.0–7.0)
HCT: 31.9 % — ABNORMAL LOW (ref 34.8–46.6)
HGB: 11.2 g/dL — ABNORMAL LOW (ref 11.6–15.9)
LYMPH%: 29 % (ref 14.0–49.7)
MCH: 34.9 pg — ABNORMAL HIGH (ref 25.1–34.0)
MCHC: 35 g/dL (ref 31.5–36.0)
MCV: 99.6 fL (ref 79.5–101.0)
MONO#: 0.3 10*3/uL (ref 0.1–0.9)
MONO%: 12.3 % (ref 0.0–14.0)
NEUT#: 1.1 10*3/uL — ABNORMAL LOW (ref 1.5–6.5)
NEUT%: 49.4 % (ref 38.4–76.8)
Platelets: 296 10*3/uL (ref 145–400)
RBC: 3.2 10*6/uL — AB (ref 3.70–5.45)
RDW: 16.9 % — AB (ref 11.2–14.5)
WBC: 2.2 10*3/uL — ABNORMAL LOW (ref 3.9–10.3)
lymph#: 0.6 10*3/uL — ABNORMAL LOW (ref 0.9–3.3)

## 2016-04-23 MED ORDER — TBO-FILGRASTIM 300 MCG/0.5ML ~~LOC~~ SOSY
300.0000 ug | PREFILLED_SYRINGE | Freq: Once | SUBCUTANEOUS | Status: AC
  Administered 2016-04-23: 300 ug via SUBCUTANEOUS
  Filled 2016-04-23: qty 0.5

## 2016-04-23 NOTE — Progress Notes (Signed)
[place]  [phone] [phone]     ID: [name] DOB: November 13, 1950  MR#: [id number]  ZYY#:[id number]  Patient Care Team: Carlyle Basques, MD as PCP - General (Infectious Diseases) Carlyle Basques, MD as PCP - Infectious Diseases (Infectious Diseases) Alphonsa Overall, MD as Consulting Physician (General Surgery) Chauncey Cruel, MD as Consulting Physician (Oncology) Gery Pray, MD as Consulting Physician (Radiation Oncology) Marcial Pacas, MD as Consulting Physician (Neurology) Druscilla Brownie, MD as Consulting Physician (Dermatology) Scot Dock, NP OTHER MD:  CHIEF COMPLAINT: Triple negative breast cancer  CURRENT TREATMENT: Adjuvant chemotherapy   BREAST CANCER HISTORY:  From the origil intake note:  [name] herself palpated a mass in the upper outer quadrant of her left breast the first week in November, and had bilateral diagnostic mammography with tomography and left breast ultrasonography at the [facility] 12/05/2015. The breast density was category C. In the upper outer left breast there was  A mass with macro lobulated contours which was barely palpable at the 12:30 o'clock position of the left breast 7 cm from the nipple. There was no palpable axillary adenopathy. Ultrasonography confirmed a 2.1 cm obliquely oriented hypoechoic mass in the area in question. Ultrasound of the left axilla was benign.  Biopsy of the left breast mass in question 12/07/2015 showed ([id number]) invasive ductal carcinoma, grade 3, estrogen and progesterone receptor negative, with an MIB-1 of 35%, and no HER-2 amplification, the signals ratio being 0.95 and the number per cell 2.80.  Her subsequent history is as detailed below  INTERVAL HISTORY: [name] is here today prior to receiving her fourth week of adjuvant Abraxane chemotherapy.  She did have trouble after her second dose with some flushing hands and joint pain, however, she had minimal  difficulties after last weeks Abraxane.  She did experience a very large bm after chemo, and this resolved after taking one imodium.    REVIEW OF SYSTEMS: She denies fevers, chills, nausea, vomiting, peripheral neuropathy pain, or any other concerns.  She does tell me that she is HIV positive, and that she is concerned about her WBC and immune system while receiving chemotherapy.  Her last viral load was undetectable, and last cd4 count was in the 200s.  She is followed closely by infectious disease.  PAST MEDICAL HISTORY: Past Medical History:  Diagnosis Date  . HIV (human immunodeficiency virus infection) ([place])   . Hypertension   . Malignant neoplasm of upper-outer quadrant of left female breast ([place]) 12/11/2015  . Migraine   . Neuropathy ([place])   . PONV (postoperative nausea and vomiting)    said usually has to have scop patch  . Rash and nonspecific skin eruption 10/16/2015  . Spinal stenosis     PAST SURGICAL HISTORY: Past Surgical History:  Procedure Laterality Date  . ABDOMINAL HYSTERECTOMY  1990  . BREAST LUMPECTOMY WITH RADIOACTIVE SEED AND SENTINEL LYMPH NODE BIOPSY Left 01/01/2016   Procedure: LEFT BREAST LUMPECTOMY WITH RADIOACTIVE SEED AND SENTINEL LYMPH NODE BIOPSY;  Surgeon: Alphonsa Overall, MD;  Location: [facility];  Service: General;  Laterality: Left;  . CARPAL TUNNEL RELEASE     both hands  . CERVICAL FUSION  1989  . PORTACATH PLACEMENT Right 01/01/2016   Procedure: INSERTION PORT-A-CATH WITH [name];  Surgeon: Alphonsa Overall, MD;  Location: [facility];  Service: General;  Laterality: Right;  . TONSILLECTOMY      FAMILY HISTORY Family History  Problem Relation Age of Onset  . High  blood pressure Mother   . High Cholesterol Mother   . Cancer Father   . High blood pressure Father   . High Cholesterol Father   . Lung cancer Maternal Grandfather   . Breast cancer Maternal Aunt 80  The patient's father died from heart disease at the age of 81.  The patient's mother died from "genetic cirrhosis" at the age of 56. The patient had 2 brothers, 5 sisters. A maternal aunt was diagnosed with breast cancer at age 41. There is no other history of breast or ovarian cancer in the family to the patient's knowledge   GYNECOLOGIC HISTORY:  No LMP recorded. Patient has had a hysterectomy. Menarche age 13, first live birth age 62. Patient is GX P1. She underwent hysterectomy with bilateral salpingo-oophorectomy at age 69. She took hormone replacement for approximately 10 years, until age 79. She also had oral contraceptives remotely for 8-10 years, with no complications.   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 At home is just she and her husband [name] who is retired from Teaching laboratory technician work. Their son [name] works as a Chief Strategy Officer for rest home in [place]. The patient has 3 grandchildren. She is not a Ambulance person.    ADVANCED DIRECTIVES: The patient has a living will in place   HEALTH MAINTENANCE: Social History  Substance Use Topics  . Smoking status: Never Smoker  . Smokeless tobacco: Never Used  . Alcohol use 0.6 oz/week    1 Glasses of wine per week     Comment: on weekends     Colonoscopy: 2012/be routinely  PAP: Status post hysterectomy  Bone density: Never   No Known Allergies  Current Outpatient Prescriptions  Medication Sig Dispense Refill  . amLODipine (NORVASC) 5 MG tablet Take 1 tablet (5 mg total) by mouth daily. 30 tablet 5  . benazepril (LOTENSIN) 40 MG tablet Take 1 tablet (40 mg total) by mouth daily. 30 tablet 5  . Coenzyme Q10 300 MG CAPS Take 300 capsules by mouth daily.    . cyclobenzaprine (FLEXERIL) 10 MG tablet Take 1 tablet (10 mg total) by mouth 3 (three) times daily as needed for muscle spasms. 90 tablet 3  . dolutegravir (TIVICAY) 50 MG tablet Take 1 tablet (50 mg total) by mouth daily. 30 tablet 5  . emtricitabine-tenofovir AF (DESCOVY) 200-25 MG tablet Take 1 tablet  by mouth daily. 30 tablet 5  . flunisolide (NASALIDE) 25 MCG/ACT (0.025%) SOLN Place 2 sprays into the nose 2 (two) times daily. 1 Bottle 3  . gabapentin (NEURONTIN) 100 MG capsule Take 1 capsule (100 mg total) by mouth 2 (two) times daily. 180 capsule 1  . lidocaine-prilocaine (EMLA) cream APPLY TO AFFECTED AREA(S) EVERY DAY  3  . LORazepam (ATIVAN) 0.5 MG tablet Take 0.5 mg by mouth every 6 (six) hours as needed.  0  . nystatin (MYCOSTATIN) 100000 UNIT/ML suspension Take 5 mLs (500,000 Units total) by mouth 4 (four) times daily. 473 mL 0  . Omega-3 Fatty Acids (FISH OIL) 1200 MG CAPS Take 1,200 mg by mouth 2 (two) times daily.      . prochlorperazine (COMPAZINE) 10 MG tablet Take 1 tablet (10 mg total) by mouth every 6 (six) hours as needed (Nausea or vomiting). 30 tablet 1  . rosuvastatin (CRESTOR) 5 MG tablet Take 1 tablet (5 mg total) by mouth at bedtime. 30 tablet 5  . sulfamethoxazole-trimethoprim (BACTRIM,SEPTRA) 400-80 MG tablet TAKE ONE TABLET BY MOUTH EVERY DAY  30 tablet 2   No current facility-administered medications for this visit.     OBJECTIVE:  Vitals:   04/23/16 0839  BP: 125/71  Pulse: 97  Resp: 20  Temp: 97.8 F (36.6 C)     Body mass index is 24.86 kg/m.    ECOG FS:1 - Symptomatic but completely ambulatory  GENERAL: Patient is a well appearing female in no acute distress HEENT:  Sclerae anicteric. PERRL. Oropharynx clear and moist. No ulcerations or evidence of oropharyngeal candidiasis. Neck is supple.  NODES:  No cervical, supraclavicular, or axillary lymphadenopathy palpated.  BREAST EXAM:  Deferred. LUNGS:  Clear to auscultation bilaterally.  No wheezes or rhonchi. HEART:  Regular rate and rhythm. No murmur appreciated. ABDOMEN:  Soft, nontender.  Positive, normoactive bowel sounds. No organomegaly palpated. MSK:  No focal spinal tenderness to palpation. Full range of motion bilaterally in the upper extremities. EXTREMITIES:  No peripheral edema.   SKIN:   Clear with no obvious rashes or skin changes. No nail dyscrasia. NEURO:  Nonfocal. Well oriented.  Appropriate affect.      LAB RESULTS:  CMP     Component Value Date/Time   NA 142 04/23/2016 0826   K 3.7 04/23/2016 0826   CL 106 03/28/2016 1139   CO2 25 04/23/2016 0826   GLUCOSE 64 (L) 04/23/2016 0826   BUN 12.1 04/23/2016 0826   CREATININE 0.8 04/23/2016 0826   CALCIUM 9.9 04/23/2016 0826   PROT 6.9 04/23/2016 0826   ALBUMIN 4.2 04/23/2016 0826   AST 49 (H) 04/23/2016 0826   ALT 58 (H) 04/23/2016 0826   ALKPHOS 74 04/23/2016 0826   BILITOT 0.42 04/23/2016 0826   GFRNONAA 87 03/28/2016 1139   GFRAA >89 03/28/2016 1139    INo results found for: SPEP, UPEP  Lab Results  Component Value Date   WBC 2.2 (L) 04/23/2016   NEUTROABS 1.1 (L) 04/23/2016   HGB 11.2 (L) 04/23/2016   HCT 31.9 (L) 04/23/2016   MCV 99.6 04/23/2016   PLT 296 04/23/2016      Chemistry      Component Value Date/Time   NA 142 04/23/2016 0826   K 3.7 04/23/2016 0826   CL 106 03/28/2016 1139   CO2 25 04/23/2016 0826   BUN 12.1 04/23/2016 0826   CREATININE 0.8 04/23/2016 0826      Component Value Date/Time   CALCIUM 9.9 04/23/2016 0826   ALKPHOS 74 04/23/2016 0826   AST 49 (H) 04/23/2016 0826   ALT 58 (H) 04/23/2016 0826   BILITOT 0.42 04/23/2016 0826       No results found for: LABCA2  No components found for: LABCA125  No results for input(s): INR in the last 168 hours.  Urinalysis    Component Value Date/Time   COLORURINE YELLOW 03/21/2007 1108   APPEARANCEUR CLEAR 03/21/2007 1108   LABSPEC 1.021 03/21/2007 1108   PHURINE 7.0 03/21/2007 1108   GLUCOSEU 100 (A) 03/21/2007 1108   HGBUR NEGATIVE 03/21/2007 1108   BILIRUBINUR NEGATIVE 03/21/2007 1108   KETONESUR NEGATIVE 03/21/2007 1108   PROTEINUR NEGATIVE 03/21/2007 1108   UROBILINOGEN 1.0 03/21/2007 1108   NITRITE NEGATIVE 03/21/2007 1108   LEUKOCYTESUR SMALL (A) 03/21/2007 1108     STUDIES: No results  found.  ELIGIBLE FOR AVAILABLE RESEARCH PROTOCOL: [facility] [phone]  ASSESSMENT: 65 y.o. [place] woman status post left breast upper outer quadrant biopsy 12/07/2015 for a clinical T2 N0, stage IIA invasive ductal carcinoma, grade 3, triple negative, with an MIB-1 of 35%  (1)  genetics testing 01/23/2016  (a) alpha - anti trypsin and ceruloplamin labs drawn 01/19/2016, both normal  (2) Status postft lumpectomy/ sentinel lymph node sampling 01/01/2016 for a pT1c pN0, stage IA  Invasive ductal carcinoma, grade 3, with negative margins.  (3) Mammaprint sent from the patient's 12/07/2015 biopsy returned "high risk", predicting a risk of recurrence of nearly 30% untreated, decreasing to a 93% chance of five-year distant metastasis free survival with chemotherapy.  (4) adjuvant chemotherapy consisting of cyclophosphamide and doxorubicin in dose dense fashion 4 started01/04/2016, completed 03/12/2016, followed by weekly Abraxane 12 starting 04/02/2016  (5) adjuvant radiation to follow  PLAN:  Kresta is doing well today.  She is unfortunately slightly neutropenic with an ANC of 1.1.  Due to this, we will hold chemotherapy.  She will receive Neupogen x 1 today, and return next week for Abraxane.  From here on out, she will receive Abraxane on days 1, 8, 15, Onpro, on a 28 day cycle.    I reviewed the above with the patient in detail.  She is in agreement with the plan.  She knows to call us prior to her next appt for any questions or concerns whatsoever.    A total of (30) minutes of face-to-face time was spent with this patient with greater than 50% of that time in counseling and care-coordination.   Scot Dock, NP   04/23/2016 9:05 AM Medical Oncology and Hematology [facility] [address] [place], [place] [address] Tel. [phone]    Fax. [phone]

## 2016-04-30 ENCOUNTER — Encounter: Payer: Self-pay | Admitting: *Deleted

## 2016-04-30 ENCOUNTER — Other Ambulatory Visit: Payer: Self-pay | Admitting: Oncology

## 2016-04-30 ENCOUNTER — Encounter: Payer: Self-pay | Admitting: Adult Health

## 2016-04-30 ENCOUNTER — Telehealth: Payer: Self-pay | Admitting: Oncology

## 2016-04-30 ENCOUNTER — Ambulatory Visit (HOSPITAL_BASED_OUTPATIENT_CLINIC_OR_DEPARTMENT_OTHER): Payer: BLUE CROSS/BLUE SHIELD | Admitting: Adult Health

## 2016-04-30 ENCOUNTER — Other Ambulatory Visit (HOSPITAL_BASED_OUTPATIENT_CLINIC_OR_DEPARTMENT_OTHER): Payer: BLUE CROSS/BLUE SHIELD

## 2016-04-30 ENCOUNTER — Ambulatory Visit (HOSPITAL_BASED_OUTPATIENT_CLINIC_OR_DEPARTMENT_OTHER): Payer: BLUE CROSS/BLUE SHIELD

## 2016-04-30 VITALS — BP 130/74 | HR 100 | Temp 98.1°F | Resp 20 | Wt 147.2 lb

## 2016-04-30 DIAGNOSIS — Z171 Estrogen receptor negative status [ER-]: Secondary | ICD-10-CM | POA: Diagnosis not present

## 2016-04-30 DIAGNOSIS — C50412 Malignant neoplasm of upper-outer quadrant of left female breast: Secondary | ICD-10-CM

## 2016-04-30 DIAGNOSIS — Z5111 Encounter for antineoplastic chemotherapy: Secondary | ICD-10-CM | POA: Diagnosis not present

## 2016-04-30 DIAGNOSIS — B20 Human immunodeficiency virus [HIV] disease: Secondary | ICD-10-CM

## 2016-04-30 LAB — CBC WITH DIFFERENTIAL/PLATELET
BASO%: 1.1 % (ref 0.0–2.0)
BASOS ABS: 0.1 10*3/uL (ref 0.0–0.1)
EOS ABS: 0.2 10*3/uL (ref 0.0–0.5)
EOS%: 2.8 % (ref 0.0–7.0)
HEMATOCRIT: 38.2 % (ref 34.8–46.6)
HEMOGLOBIN: 13.2 g/dL (ref 11.6–15.9)
LYMPH#: 1.4 10*3/uL (ref 0.9–3.3)
LYMPH%: 18.5 % (ref 14.0–49.7)
MCH: 34.8 pg — AB (ref 25.1–34.0)
MCHC: 34.5 g/dL (ref 31.5–36.0)
MCV: 100.8 fL (ref 79.5–101.0)
MONO#: 1.6 10*3/uL — ABNORMAL HIGH (ref 0.1–0.9)
MONO%: 22.1 % — ABNORMAL HIGH (ref 0.0–14.0)
NEUT#: 4.1 10*3/uL (ref 1.5–6.5)
NEUT%: 55.5 % (ref 38.4–76.8)
PLATELETS: 288 10*3/uL (ref 145–400)
RBC: 3.79 10*6/uL (ref 3.70–5.45)
RDW: 16.6 % — AB (ref 11.2–14.5)
WBC: 7.3 10*3/uL (ref 3.9–10.3)

## 2016-04-30 LAB — COMPREHENSIVE METABOLIC PANEL
ALBUMIN: 4.4 g/dL (ref 3.5–5.0)
ALK PHOS: 99 U/L (ref 40–150)
ALT: 47 U/L (ref 0–55)
AST: 42 U/L — AB (ref 5–34)
Anion Gap: 8 mEq/L (ref 3–11)
BILIRUBIN TOTAL: 0.48 mg/dL (ref 0.20–1.20)
BUN: 10.4 mg/dL (ref 7.0–26.0)
CALCIUM: 10.3 mg/dL (ref 8.4–10.4)
CO2: 26 mEq/L (ref 22–29)
CREATININE: 0.8 mg/dL (ref 0.6–1.1)
Chloride: 107 mEq/L (ref 98–109)
EGFR: 80 mL/min/{1.73_m2} — ABNORMAL LOW (ref >90)
GLUCOSE: 59 mg/dL — AB (ref 70–140)
POTASSIUM: 4.6 meq/L (ref 3.5–5.1)
Sodium: 141 mEq/L (ref 136–145)
Total Protein: 7.5 g/dL (ref 6.4–8.3)

## 2016-04-30 MED ORDER — SODIUM CHLORIDE 0.9% FLUSH
10.0000 mL | INTRAVENOUS | Status: DC | PRN
  Administered 2016-04-30: 10 mL
  Filled 2016-04-30: qty 10

## 2016-04-30 MED ORDER — SODIUM CHLORIDE 0.9 % IV SOLN
Freq: Once | INTRAVENOUS | Status: AC
  Administered 2016-04-30: 12:00:00 via INTRAVENOUS

## 2016-04-30 MED ORDER — HEPARIN SOD (PORK) LOCK FLUSH 100 UNIT/ML IV SOLN
500.0000 [IU] | Freq: Once | INTRAVENOUS | Status: AC | PRN
  Administered 2016-04-30: 500 [IU]
  Filled 2016-04-30: qty 5

## 2016-04-30 MED ORDER — PROCHLORPERAZINE MALEATE 10 MG PO TABS
ORAL_TABLET | ORAL | Status: AC
  Filled 2016-04-30: qty 1

## 2016-04-30 MED ORDER — PACLITAXEL PROTEIN-BOUND CHEMO INJECTION 100 MG
100.0000 mg/m2 | Freq: Once | INTRAVENOUS | Status: AC
  Administered 2016-04-30: 175 mg via INTRAVENOUS
  Filled 2016-04-30: qty 35

## 2016-04-30 MED ORDER — PROCHLORPERAZINE MALEATE 10 MG PO TABS
10.0000 mg | ORAL_TABLET | Freq: Once | ORAL | Status: AC
  Administered 2016-04-30: 10 mg via ORAL

## 2016-04-30 NOTE — Telephone Encounter (Signed)
Gave patient avs report and appointments for April and May.  °

## 2016-04-30 NOTE — Patient Instructions (Signed)
[facility] Discharge Instructions for Patients Receiving Chemotherapy  Today you received the following chemotherapy agents: Abraxane.   To help prevent nausea and vomiting after your treatment, we encourage you to take your nausea medication: Compazine. Take one every 6 hours as needed.  If you develop nausea and vomiting that is not controlled by your nausea medication, call the clinic.   BELOW ARE SYMPTOMS THAT SHOULD BE REPORTED IMMEDIATELY:  *FEVER GREATER THAN 100.5 F  *CHILLS WITH OR WITHOUT FEVER  NAUSEA AND VOMITING THAT IS NOT CONTROLLED WITH YOUR NAUSEA MEDICATION  *UNUSUAL SHORTNESS OF BREATH  *UNUSUAL BRUISING OR BLEEDING  TENDERNESS IN MOUTH AND THROAT WITH OR WITHOUT PRESENCE OF ULCERS  *URINARY PROBLEMS  *BOWEL PROBLEMS  UNUSUAL RASH Items with * indicate a potential emergency and should be followed up as soon as possible.  Feel free to call the clinic should you have any questions or concerns. The clinic phone number is [phone].  Please show the CHEMO ALERT CARD at check-in to the Emergency Department and triage nurse.   

## 2016-04-30 NOTE — Progress Notes (Signed)
[place]  Telephone:([phone]) [phone] Fax:(336) [phone]     ID: [id number] FEDORKO DOB: Dec 17, 1950  MR#: [id number]  JYN#:[id number]  Patient Care Team: Carlyle Basques, MD as PCP - General (Infectious Diseases) Carlyle Basques, MD as PCP - Infectious Diseases (Infectious Diseases) Alphonsa Overall, MD as Consulting Physician (General Surgery) Chauncey Cruel, MD as Consulting Physician (Oncology) Gery Pray, MD as Consulting Physician (Radiation Oncology) Marcial Pacas, MD as Consulting Physician (Neurology) Druscilla Brownie, MD as Consulting Physician (Dermatology) Gardenia Phlegm, NP as Nurse Practitioner (Hematology and Oncology) Scot Dock, NP OTHER MD:  CHIEF COMPLAINT: Triple negative breast cancer  CURRENT TREATMENT: Adjuvant chemotherapy   BREAST CANCER HISTORY:  From the origil intake note:  Kamilia herself palpated a mass in the upper outer quadrant of her left breast the first week in November, and had bilateral diagnostic mammography with tomography and left breast ultrasonography at the [facility] 12/05/2015. The breast density was category C. In the upper outer left breast there was  A mass with macro lobulated contours which was barely palpable at the 12:30 o'clock position of the left breast 7 cm from the nipple. There was no palpable axillary adenopathy. Ultrasonography confirmed a 2.1 cm obliquely oriented hypoechoic mass in the area in question. Ultrasound of the left axilla was benign.  Biopsy of the left breast mass in question 12/07/2015 showed ([id number]) invasive ductal carcinoma, grade 3, estrogen and progesterone receptor negative, with an MIB-1 of 35%, and no HER-2 amplification, the signals ratio being 0.95 and the number per cell 2.80.  Her subsequent history is as detailed below  INTERVAL HISTORY: Arline is here today prior to receiving her fourth week of adjuvant Abraxane chemotherapy. She is doing well.  Her anc was 1100 last  week and she received 1 shot of neupogen and we held treatment.  She says that since then her fatigue is much improved.    REVIEW OF SYSTEMS: Bemnet denies fevers, chills, nausea, vomiting, constipation, diarrhea, numbness/tingling, or any other concerns.  A detailed ROS is non contributory.   PAST MEDICAL HISTORY: Past Medical History:  Diagnosis Date  . HIV (human immunodeficiency virus infection) ([place])   . Hypertension   . Malignant neoplasm of upper-outer quadrant of left female breast ([place]) 12/11/2015  . Migraine   . Neuropathy ([place])   . PONV (postoperative nausea and vomiting)    said usually has to have scop patch  . Rash and nonspecific skin eruption 10/16/2015  . Spinal stenosis     PAST SURGICAL HISTORY: Past Surgical History:  Procedure Laterality Date  . ABDOMINAL HYSTERECTOMY  1990  . BREAST LUMPECTOMY WITH RADIOACTIVE SEED AND SENTINEL LYMPH NODE BIOPSY Left 01/01/2016   Procedure: LEFT BREAST LUMPECTOMY WITH RADIOACTIVE SEED AND SENTINEL LYMPH NODE BIOPSY;  Surgeon: Alphonsa Overall, MD;  Location: [facility];  Service: General;  Laterality: Left;  . CARPAL TUNNEL RELEASE     both hands  . CERVICAL FUSION  1989  . PORTACATH PLACEMENT Right 01/01/2016   Procedure: INSERTION PORT-A-CATH WITH [name];  Surgeon: Alphonsa Overall, MD;  Location: [place];  Service: General;  Laterality: Right;  . TONSILLECTOMY      FAMILY HISTORY Family History  Problem Relation Age of Onset  . High blood pressure Mother   . High Cholesterol Mother   . Cancer Father   . High blood pressure Father   . High Cholesterol Father   . Lung cancer Maternal Grandfather   . Breast  cancer Maternal Aunt 62  The patient's father died from heart disease at the age of 23. The patient's mother died from "genetic cirrhosis" at the age of 21. The patient had 2 brothers, 5 sisters. A maternal aunt was diagnosed with breast cancer at age 63. There is no other history of breast or  ovarian cancer in the family to the patient's knowledge   GYNECOLOGIC HISTORY:  No LMP recorded. Patient has had a hysterectomy. Menarche age 33, first live birth age 67. Patient is GX P1. She underwent hysterectomy with bilateral salpingo-oophorectomy at age 37. She took hormone replacement for approximately 10 years, until age 15. She also had oral contraceptives remotely for 8-10 years, with no complications.   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 At home is just she and her husband [name] who is retired from Teaching laboratory technician work. Their son [name] works as a Chief Strategy Officer for rest home in [place]. The patient has 3 grandchildren. She is not a Ambulance person.    ADVANCED DIRECTIVES: The patient has a living will in place   HEALTH MAINTENANCE: Social History  Substance Use Topics  . Smoking status: Never Smoker  . Smokeless tobacco: Never Used  . Alcohol use 0.6 oz/week    1 Glasses of wine per week     Comment: on weekends     Colonoscopy: 2012/be routinely  PAP: Status post hysterectomy  Bone density: Never   No Known Allergies  Current Outpatient Prescriptions  Medication Sig Dispense Refill  . amLODipine (NORVASC) 5 MG tablet Take 1 tablet (5 mg total) by mouth daily. 30 tablet 5  . benazepril (LOTENSIN) 40 MG tablet Take 1 tablet (40 mg total) by mouth daily. 30 tablet 5  . Coenzyme Q10 300 MG CAPS Take 300 capsules by mouth daily.    . cyclobenzaprine (FLEXERIL) 10 MG tablet Take 1 tablet (10 mg total) by mouth 3 (three) times daily as needed for muscle spasms. 90 tablet 3  . dolutegravir (TIVICAY) 50 MG tablet Take 1 tablet (50 mg total) by mouth daily. 30 tablet 5  . emtricitabine-tenofovir AF (DESCOVY) 200-25 MG tablet Take 1 tablet by mouth daily. 30 tablet 5  . flunisolide (NASALIDE) 25 MCG/ACT (0.025%) SOLN Place 2 sprays into the nose 2 (two) times daily. 1 Bottle 3  . gabapentin (NEURONTIN) 100 MG capsule Take 1 capsule (100 mg  total) by mouth 2 (two) times daily. 180 capsule 1  . lidocaine-prilocaine (EMLA) cream APPLY TO AFFECTED AREA(S) EVERY DAY  3  . LORazepam (ATIVAN) 0.5 MG tablet Take 0.5 mg by mouth every 6 (six) hours as needed.  0  . Multiple Vitamin (MULTIVITAMIN WITH MINERALS) TABS tablet Take 1 tablet by mouth daily.    Marland Kitchen nystatin (MYCOSTATIN) 100000 UNIT/ML suspension Take 5 mLs (500,000 Units total) by mouth 4 (four) times daily. 473 mL 0  . Omega-3 Fatty Acids (FISH OIL) 1200 MG CAPS Take 1,200 mg by mouth 2 (two) times daily.      . prochlorperazine (COMPAZINE) 10 MG tablet Take 1 tablet (10 mg total) by mouth every 6 (six) hours as needed (Nausea or vomiting). 30 tablet 1  . rosuvastatin (CRESTOR) 5 MG tablet Take 1 tablet (5 mg total) by mouth at bedtime. 30 tablet 5  . sulfamethoxazole-trimethoprim (BACTRIM,SEPTRA) 400-80 MG tablet TAKE ONE TABLET BY MOUTH EVERY DAY 30 tablet 2   No current facility-administered medications for this visit.     OBJECTIVE:  Vitals:  04/30/16 1049  BP: 130/74  Pulse: 100  Resp: 20  Temp: 98.1 F (36.7 C)     Body mass index is 24.5 kg/m.    ECOG FS:1 - Symptomatic but completely ambulatory  GENERAL: Patient is a well appearing female in no acute distress HEENT:  Sclerae anicteric. PERRL. Oropharynx clear and moist. No ulcerations or evidence of oropharyngeal candidiasis. Neck is supple.  NODES:  No cervical, supraclavicular, or axillary lymphadenopathy palpated.  BREAST EXAM:  Deferred. LUNGS:  Clear to auscultation bilaterally.  No wheezes or rhonchi. HEART:  Regular rate and rhythm. No murmur appreciated. ABDOMEN:  Soft, nontender.  Positive, normoactive bowel sounds. No organomegaly palpated. MSK:  No focal spinal tenderness to palpation. Full range of motion bilaterally in the upper extremities. EXTREMITIES:  No peripheral edema.   SKIN:  Clear with no obvious rashes or skin changes. No nail dyscrasia. NEURO:  Nonfocal. Well oriented.  Appropriate  affect.      LAB RESULTS:  CMP     Component Value Date/Time   NA 141 04/30/2016 1004   K 4.6 04/30/2016 1004   CL 106 03/28/2016 1139   CO2 26 04/30/2016 1004   GLUCOSE 59 (L) 04/30/2016 1004   BUN 10.4 04/30/2016 1004   CREATININE 0.8 04/30/2016 1004   CALCIUM 10.3 04/30/2016 1004   PROT 7.5 04/30/2016 1004   ALBUMIN 4.4 04/30/2016 1004   AST 42 (H) 04/30/2016 1004   ALT 47 04/30/2016 1004   ALKPHOS 99 04/30/2016 1004   BILITOT 0.48 04/30/2016 1004   GFRNONAA 87 03/28/2016 1139   GFRAA >89 03/28/2016 1139    INo results found for: SPEP, UPEP  Lab Results  Component Value Date   WBC 7.3 04/30/2016   NEUTROABS 4.1 04/30/2016   HGB 13.2 04/30/2016   HCT 38.2 04/30/2016   MCV 100.8 04/30/2016   PLT 288 04/30/2016      Chemistry      Component Value Date/Time   NA 141 04/30/2016 1004   K 4.6 04/30/2016 1004   CL 106 03/28/2016 1139   CO2 26 04/30/2016 1004   BUN 10.4 04/30/2016 1004   CREATININE 0.8 04/30/2016 1004      Component Value Date/Time   CALCIUM 10.3 04/30/2016 1004   ALKPHOS 99 04/30/2016 1004   AST 42 (H) 04/30/2016 1004   ALT 47 04/30/2016 1004   BILITOT 0.48 04/30/2016 1004       No results found for: LABCA2  No components found for: LABCA125  No results for input(s): INR in the last 168 hours.  Urinalysis    Component Value Date/Time   COLORURINE YELLOW 03/21/2007 1108   APPEARANCEUR CLEAR 03/21/2007 1108   LABSPEC 1.021 03/21/2007 1108   PHURINE 7.0 03/21/2007 1108   GLUCOSEU 100 (A) 03/21/2007 1108   HGBUR NEGATIVE 03/21/2007 1108   BILIRUBINUR NEGATIVE 03/21/2007 1108   KETONESUR NEGATIVE 03/21/2007 1108   PROTEINUR NEGATIVE 03/21/2007 1108   UROBILINOGEN 1.0 03/21/2007 1108   NITRITE NEGATIVE 03/21/2007 1108   LEUKOCYTESUR SMALL (A) 03/21/2007 1108     STUDIES: No results found.  ELIGIBLE FOR AVAILABLE RESEARCH PROTOCOL: [facility] [phone]  ASSESSMENT: 66 y.o. [place] woman status post left breast upper outer  quadrant biopsy 12/07/2015 for a clinical T2 N0, stage IIA invasive ductal carcinoma, grade 3, triple negative, with an MIB-1 of 35%  (1) genetics testing 01/23/2016  (a) alpha - anti trypsin and ceruloplamin labs drawn 01/19/2016, both normal  (2) Status postft lumpectomy/ sentinel lymph node sampling 01/01/2016 for  a pT1c pN0, stage IA  Invasive ductal carcinoma, grade 3, with negative margins.  (3) Mammaprint sent from the patient's 12/07/2015 biopsy returned "high risk", predicting a risk of recurrence of nearly 30% untreated, decreasing to a 93% chance of five-year distant metastasis free survival with chemotherapy.  (4) adjuvant chemotherapy consisting of cyclophosphamide and doxorubicin in dose dense fashion 4 started01/04/2016, completed 03/12/2016, followed by weekly Abraxane 12 starting 04/02/2016, changed on 04/30/16 to Abraxane days 1,8,15, with Neulasta support on 28 day cycle  (5) adjuvant radiation to follow  PLAN: Antigone is doing well today.  I reviewed her labs with her which were normal.  She will proceed with chemotherapy today.  She will now receive Abraxane on days 1,8,15, on 28 day cycle with neulasta support.  She denies any difficulty with this.  She will return in one week for labs and Abraxane, and in two weeks for labs, evaluation and Abraxane.      I reviewed the above with the patient in detail.  She is in agreement with the plan.  She knows to call us prior to her next appt for any questions or concerns whatsoever.    A total of (32) minutes of face-to-face time was spent with this patient with greater than 50% of that time in counseling and care-coordination.   Scot Dock, NP   04/30/2016 11:17 AM Medical Oncology and Hematology [facility] [address] [place], [place] [address] Tel. [phone]    Fax. [phone]

## 2016-05-07 ENCOUNTER — Ambulatory Visit (HOSPITAL_BASED_OUTPATIENT_CLINIC_OR_DEPARTMENT_OTHER): Payer: BLUE CROSS/BLUE SHIELD

## 2016-05-07 ENCOUNTER — Ambulatory Visit: Payer: BLUE CROSS/BLUE SHIELD | Admitting: Adult Health

## 2016-05-07 ENCOUNTER — Other Ambulatory Visit (HOSPITAL_BASED_OUTPATIENT_CLINIC_OR_DEPARTMENT_OTHER): Payer: BLUE CROSS/BLUE SHIELD

## 2016-05-07 VITALS — BP 134/72 | HR 97 | Temp 98.0°F | Resp 18

## 2016-05-07 DIAGNOSIS — Z5111 Encounter for antineoplastic chemotherapy: Secondary | ICD-10-CM | POA: Diagnosis not present

## 2016-05-07 DIAGNOSIS — C50412 Malignant neoplasm of upper-outer quadrant of left female breast: Secondary | ICD-10-CM

## 2016-05-07 DIAGNOSIS — Z171 Estrogen receptor negative status [ER-]: Principal | ICD-10-CM

## 2016-05-07 DIAGNOSIS — B20 Human immunodeficiency virus [HIV] disease: Secondary | ICD-10-CM

## 2016-05-07 LAB — CBC WITH DIFFERENTIAL/PLATELET
BASO%: 3.2 % — ABNORMAL HIGH (ref 0.0–2.0)
BASOS ABS: 0.1 10*3/uL (ref 0.0–0.1)
EOS ABS: 0.1 10*3/uL (ref 0.0–0.5)
EOS%: 4.9 % (ref 0.0–7.0)
HCT: 35.4 % (ref 34.8–46.6)
HGB: 12.1 g/dL (ref 11.6–15.9)
LYMPH%: 28.5 % (ref 14.0–49.7)
MCH: 34.3 pg — AB (ref 25.1–34.0)
MCHC: 34.2 g/dL (ref 31.5–36.0)
MCV: 100.3 fL (ref 79.5–101.0)
MONO#: 0.2 10*3/uL (ref 0.1–0.9)
MONO%: 6.4 % (ref 0.0–14.0)
NEUT#: 1.7 10*3/uL (ref 1.5–6.5)
NEUT%: 57 % (ref 38.4–76.8)
Platelets: 316 10*3/uL (ref 145–400)
RBC: 3.53 10*6/uL — AB (ref 3.70–5.45)
RDW: 15.5 % — ABNORMAL HIGH (ref 11.2–14.5)
WBC: 3 10*3/uL — ABNORMAL LOW (ref 3.9–10.3)
lymph#: 0.8 10*3/uL — ABNORMAL LOW (ref 0.9–3.3)

## 2016-05-07 MED ORDER — SODIUM CHLORIDE 0.9 % IV SOLN
Freq: Once | INTRAVENOUS | Status: AC
  Administered 2016-05-07: 10:00:00 via INTRAVENOUS

## 2016-05-07 MED ORDER — PROCHLORPERAZINE MALEATE 10 MG PO TABS
10.0000 mg | ORAL_TABLET | Freq: Once | ORAL | Status: AC
  Administered 2016-05-07: 10 mg via ORAL

## 2016-05-07 MED ORDER — HEPARIN SOD (PORK) LOCK FLUSH 100 UNIT/ML IV SOLN
500.0000 [IU] | Freq: Once | INTRAVENOUS | Status: AC | PRN
  Administered 2016-05-07: 500 [IU]
  Filled 2016-05-07: qty 5

## 2016-05-07 MED ORDER — PACLITAXEL PROTEIN-BOUND CHEMO INJECTION 100 MG
100.0000 mg/m2 | Freq: Once | INTRAVENOUS | Status: AC
  Administered 2016-05-07: 175 mg via INTRAVENOUS
  Filled 2016-05-07: qty 35

## 2016-05-07 MED ORDER — PROCHLORPERAZINE MALEATE 10 MG PO TABS
ORAL_TABLET | ORAL | Status: AC
  Filled 2016-05-07: qty 1

## 2016-05-07 MED ORDER — SODIUM CHLORIDE 0.9% FLUSH
10.0000 mL | INTRAVENOUS | Status: DC | PRN
Start: 2016-05-07 — End: 2016-05-07
  Administered 2016-05-07: 10 mL
  Filled 2016-05-07: qty 10

## 2016-05-07 NOTE — Patient Instructions (Signed)
[facility] Discharge Instructions for Patients Receiving Chemotherapy  Today you received the following chemotherapy agents: Abraxane.   To help prevent nausea and vomiting after your treatment, we encourage you to take your nausea medication: Compazine. Take one every 6 hours as needed.  If you develop nausea and vomiting that is not controlled by your nausea medication, call the clinic.   BELOW ARE SYMPTOMS THAT SHOULD BE REPORTED IMMEDIATELY:  *FEVER GREATER THAN 100.5 F  *CHILLS WITH OR WITHOUT FEVER  NAUSEA AND VOMITING THAT IS NOT CONTROLLED WITH YOUR NAUSEA MEDICATION  *UNUSUAL SHORTNESS OF BREATH  *UNUSUAL BRUISING OR BLEEDING  TENDERNESS IN MOUTH AND THROAT WITH OR WITHOUT PRESENCE OF ULCERS  *URINARY PROBLEMS  *BOWEL PROBLEMS  UNUSUAL RASH Items with * indicate a potential emergency and should be followed up as soon as possible.  Feel free to call the clinic should you have any questions or concerns. The clinic phone number is [phone].  Please show the CHEMO ALERT CARD at check-in to the Emergency Department and triage nurse.   

## 2016-05-09 ENCOUNTER — Other Ambulatory Visit: Payer: Self-pay | Admitting: Internal Medicine

## 2016-05-09 DIAGNOSIS — B20 Human immunodeficiency virus [HIV] disease: Secondary | ICD-10-CM

## 2016-05-14 ENCOUNTER — Other Ambulatory Visit (HOSPITAL_BASED_OUTPATIENT_CLINIC_OR_DEPARTMENT_OTHER): Payer: BLUE CROSS/BLUE SHIELD

## 2016-05-14 ENCOUNTER — Ambulatory Visit (HOSPITAL_BASED_OUTPATIENT_CLINIC_OR_DEPARTMENT_OTHER): Payer: BLUE CROSS/BLUE SHIELD | Admitting: Adult Health

## 2016-05-14 ENCOUNTER — Encounter: Payer: Self-pay | Admitting: Adult Health

## 2016-05-14 ENCOUNTER — Ambulatory Visit: Payer: BLUE CROSS/BLUE SHIELD

## 2016-05-14 VITALS — BP 120/76 | HR 106 | Temp 98.2°F | Resp 18 | Wt 149.5 lb

## 2016-05-14 DIAGNOSIS — D702 Other drug-induced agranulocytosis: Secondary | ICD-10-CM

## 2016-05-14 DIAGNOSIS — Z5189 Encounter for other specified aftercare: Secondary | ICD-10-CM | POA: Diagnosis not present

## 2016-05-14 DIAGNOSIS — Z171 Estrogen receptor negative status [ER-]: Secondary | ICD-10-CM | POA: Diagnosis not present

## 2016-05-14 DIAGNOSIS — C50412 Malignant neoplasm of upper-outer quadrant of left female breast: Secondary | ICD-10-CM

## 2016-05-14 DIAGNOSIS — B20 Human immunodeficiency virus [HIV] disease: Secondary | ICD-10-CM | POA: Diagnosis not present

## 2016-05-14 DIAGNOSIS — D709 Neutropenia, unspecified: Secondary | ICD-10-CM

## 2016-05-14 LAB — COMPREHENSIVE METABOLIC PANEL
ALK PHOS: 79 U/L (ref 40–150)
ALT: 45 U/L (ref 0–55)
AST: 43 U/L — ABNORMAL HIGH (ref 5–34)
Albumin: 4.2 g/dL (ref 3.5–5.0)
Anion Gap: 10 mEq/L (ref 3–11)
BILIRUBIN TOTAL: 0.4 mg/dL (ref 0.20–1.20)
BUN: 13.3 mg/dL (ref 7.0–26.0)
CO2: 25 meq/L (ref 22–29)
CREATININE: 0.9 mg/dL (ref 0.6–1.1)
Calcium: 9.8 mg/dL (ref 8.4–10.4)
Chloride: 108 mEq/L (ref 98–109)
EGFR: 72 mL/min/{1.73_m2} — AB (ref >90)
Glucose: 89 mg/dl (ref 70–140)
Potassium: 4.3 mEq/L (ref 3.5–5.1)
SODIUM: 143 meq/L (ref 136–145)
TOTAL PROTEIN: 6.8 g/dL (ref 6.4–8.3)

## 2016-05-14 LAB — CBC WITH DIFFERENTIAL/PLATELET
BASO%: 2.6 % — ABNORMAL HIGH (ref 0.0–2.0)
BASOS ABS: 0.1 10*3/uL (ref 0.0–0.1)
EOS%: 4.4 % (ref 0.0–7.0)
Eosinophils Absolute: 0.1 10*3/uL (ref 0.0–0.5)
HCT: 33.8 % — ABNORMAL LOW (ref 34.8–46.6)
HGB: 11.7 g/dL (ref 11.6–15.9)
LYMPH%: 41.4 % (ref 14.0–49.7)
MCH: 34.5 pg — ABNORMAL HIGH (ref 25.1–34.0)
MCHC: 34.5 g/dL (ref 31.5–36.0)
MCV: 99.9 fL (ref 79.5–101.0)
MONO#: 0.1 10*3/uL (ref 0.1–0.9)
MONO%: 6.7 % (ref 0.0–14.0)
NEUT#: 0.9 10*3/uL — ABNORMAL LOW (ref 1.5–6.5)
NEUT%: 44.9 % (ref 38.4–76.8)
Platelets: 307 10*3/uL (ref 145–400)
RBC: 3.38 10*6/uL — AB (ref 3.70–5.45)
RDW: 14.6 % — AB (ref 11.2–14.5)
WBC: 2 10*3/uL — AB (ref 3.9–10.3)
lymph#: 0.8 10*3/uL — ABNORMAL LOW (ref 0.9–3.3)

## 2016-05-14 MED ORDER — TBO-FILGRASTIM 300 MCG/0.5ML ~~LOC~~ SOSY
300.0000 ug | PREFILLED_SYRINGE | Freq: Once | SUBCUTANEOUS | Status: AC
  Administered 2016-05-14: 300 ug via SUBCUTANEOUS
  Filled 2016-05-14: qty 0.5

## 2016-05-14 NOTE — Progress Notes (Signed)
[facility]  [phone] Fax:([phone]) [phone]     ID: [name] DOB: [birth year]  MR#: [id number]  TSV#:[id number]  Patient Care Team: Carlyle Basques, MD as PCP - General (Infectious Diseases) Carlyle Basques, MD as PCP - Infectious Diseases (Infectious Diseases) Alphonsa Overall, MD as Consulting Physician (General Surgery) Chauncey Cruel, MD as Consulting Physician (Oncology) Gery Pray, MD as Consulting Physician (Radiation Oncology) Marcial Pacas, MD as Consulting Physician (Neurology) Druscilla Brownie, MD as Consulting Physician (Dermatology) Gardenia Phlegm, NP as Nurse Practitioner (Hematology and Oncology) Scot Dock, NP OTHER MD:  CHIEF COMPLAINT: Triple negative breast cancer  CURRENT TREATMENT: Adjuvant chemotherapy   BREAST CANCER HISTORY:  From the origil intake note:  [name] herself palpated a mass in the upper outer quadrant of her left breast the first week in November, and had bilateral diagnostic mammography with tomography and left breast ultrasonography at the [facility] 12/05/2015. The breast density was category C. In the upper outer left breast there was  A mass with macro lobulated contours which was barely palpable at the 12:30 o'clock position of the left breast 7 cm from the nipple. There was no palpable axillary adenopathy. Ultrasonography confirmed a 2.1 cm obliquely oriented hypoechoic mass in the area in question. Ultrasound of the left axilla was benign.  Biopsy of the left breast mass in question 12/07/2015 showed ([id number]) invasive ductal carcinoma, grade 3, estrogen and progesterone receptor negative, with an MIB-1 of 35%, and no HER-2 amplification, the signals ratio being 0.95 and the number per cell 2.80.  Her subsequent history is as detailed below  INTERVAL HISTORY: [name] is here today prior to receiving her fifth week of Abraxane.  She is doing well today.  She had one episode of epistaxis last  week, that resolved quickly and hasn't recurred.  She is doing well otherwise.  She denies any peripheral neuropathy, fevers, chills, nausea, vomiting, mucositis, chest pain/palpitations, or further concerns.  Her anc is 900 today.    REVIEW OF SYSTEMS: A detailed ROS was conducted and is negative/non contributory except what is noted above.   PAST MEDICAL HISTORY: Past Medical History:  Diagnosis Date  . HIV (human immunodeficiency virus infection) ([place])   . Hypertension   . Malignant neoplasm of upper-outer quadrant of left female breast ([place]) 12/11/2015  . Migraine   . Neuropathy   . PONV (postoperative nausea and vomiting)    said usually has to have scop patch  . Rash and nonspecific skin eruption 10/16/2015  . Spinal stenosis     PAST SURGICAL HISTORY: Past Surgical History:  Procedure Laterality Date  . ABDOMINAL HYSTERECTOMY  1990  . BREAST LUMPECTOMY WITH RADIOACTIVE SEED AND SENTINEL LYMPH NODE BIOPSY Left 01/01/2016   Procedure: LEFT BREAST LUMPECTOMY WITH RADIOACTIVE SEED AND SENTINEL LYMPH NODE BIOPSY;  Surgeon: Alphonsa Overall, MD;  Location: [facility];  Service: General;  Laterality: Left;  . CARPAL TUNNEL RELEASE     both hands  . CERVICAL FUSION  1989  . PORTACATH PLACEMENT Right 01/01/2016   Procedure: INSERTION PORT-A-CATH WITH [name];  Surgeon: Alphonsa Overall, MD;  Location: [facility];  Service: General;  Laterality: Right;  . TONSILLECTOMY      FAMILY HISTORY Family History  Problem Relation Age of Onset  . High blood pressure Mother   . High Cholesterol Mother   . Cancer Father   . High blood pressure Father   . High Cholesterol Father   . Lung  cancer Maternal Grandfather   . Breast cancer Maternal Aunt 30  The patient's father died from heart disease at the age of 71. The patient's mother died from "genetic cirrhosis" at the age of 78. The patient had 2 brothers, 5 sisters. A maternal aunt was diagnosed with breast cancer at age  71. There is no other history of breast or ovarian cancer in the family to the patient's knowledge   GYNECOLOGIC HISTORY:  No LMP recorded. Patient has had a hysterectomy. Menarche age 66, first live birth age 66. Patient is GX P1. She underwent hysterectomy with bilateral salpingo-oophorectomy at age 66. She took hormone replacement for approximately 10 years, until age 66. She also had oral contraceptives remotely for 8-10 years, with no complications.   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 At home is just she and her husband [name] who is retired from Teaching laboratory technician work. Their son [name] works as a Chief Strategy Officer for rest home in [place]. The patient has 3 grandchildren. She is not a Ambulance person.   ADVANCED DIRECTIVES: The patient has a living will in place   HEALTH MAINTENANCE: Social History  Substance Use Topics  . Smoking status: Never Smoker  . Smokeless tobacco: Never Used  . Alcohol use 0.6 oz/week    1 Glasses of wine per week     Comment: on weekends     Colonoscopy: 2012/be routinely  PAP: Status post hysterectomy  Bone density: Never   No Known Allergies  Current Outpatient Prescriptions  Medication Sig Dispense Refill  . amLODipine (NORVASC) 5 MG tablet Take 1 tablet (5 mg total) by mouth daily. 30 tablet 5  . benazepril (LOTENSIN) 40 MG tablet Take 1 tablet (40 mg total) by mouth daily. 30 tablet 5  . Coenzyme Q10 300 MG CAPS Take 300 capsules by mouth daily.    . cyclobenzaprine (FLEXERIL) 10 MG tablet Take 1 tablet (10 mg total) by mouth 3 (three) times daily as needed for muscle spasms. 90 tablet 3  . DESCOVY 200-25 MG tablet TAKE ONE TABLET BY MOUTH EVERY DAY 30 tablet 5  . flunisolide (NASALIDE) 25 MCG/ACT (0.025%) SOLN Place 2 sprays into the nose 2 (two) times daily. 1 Bottle 3  . gabapentin (NEURONTIN) 100 MG capsule Take 1 capsule (100 mg total) by mouth 2 (two) times daily. 180 capsule 1  . lidocaine-prilocaine  (EMLA) cream APPLY TO AFFECTED AREA(S) EVERY DAY  3  . Multiple Vitamin (MULTIVITAMIN WITH MINERALS) TABS tablet Take 1 tablet by mouth daily.    . Omega-3 Fatty Acids (FISH OIL) 1200 MG CAPS Take 1,200 mg by mouth 2 (two) times daily.      . prochlorperazine (COMPAZINE) 10 MG tablet Take 1 tablet (10 mg total) by mouth every 6 (six) hours as needed (Nausea or vomiting). 30 tablet 1  . rosuvastatin (CRESTOR) 5 MG tablet Take 1 tablet (5 mg total) by mouth at bedtime. 30 tablet 5  . sulfamethoxazole-trimethoprim (BACTRIM,SEPTRA) 400-80 MG tablet TAKE ONE TABLET BY MOUTH EVERY DAY 30 tablet 2  . TIVICAY 50 MG tablet TAKE ONE TABLET BY MOUTH EVERY DAY 30 tablet 5  . LORazepam (ATIVAN) 0.5 MG tablet Take 0.5 mg by mouth every 6 (six) hours as needed.  0  . nystatin (MYCOSTATIN) 100000 UNIT/ML suspension Take 5 mLs (500,000 Units total) by mouth 4 (four) times daily. (Patient not taking: Reported on 05/14/2016) 473 mL 0   No current facility-administered medications for this visit.  OBJECTIVE:  Vitals:   05/14/16 0918  BP: 120/76  Pulse: (!) 106  Resp: 18  Temp: 98.2 F (36.8 C)     Body mass index is 24.88 kg/m.    ECOG FS:1 - Symptomatic but completely ambulatory  GENERAL: Patient is a well appearing female in no acute distress HEENT:  Sclerae anicteric. PERRL. Oropharynx clear and moist. No ulcerations or evidence of oropharyngeal candidiasis. Neck is supple.  NODES:  No cervical, supraclavicular, or axillary lymphadenopathy palpated.  BREAST EXAM:  Deferred. LUNGS:  Clear to auscultation bilaterally.  No wheezes or rhonchi. HEART:  Regular rate and rhythm. No murmur appreciated. ABDOMEN:  Soft, nontender.  Positive, normoactive bowel sounds. No organomegaly palpated. MSK:  No focal spinal tenderness to palpation. Full range of motion bilaterally in the upper extremities. EXTREMITIES:  No peripheral edema.   SKIN:  Clear with no obvious rashes or skin changes. No nail  dyscrasia. NEURO:  Nonfocal. Well oriented.  Appropriate affect.      LAB RESULTS:  CMP     Component Value Date/Time   NA 141 04/30/2016 1004   K 4.6 04/30/2016 1004   CL 106 03/28/2016 1139   CO2 26 04/30/2016 1004   GLUCOSE 59 (L) 04/30/2016 1004   BUN 10.4 04/30/2016 1004   CREATININE 0.8 04/30/2016 1004   CALCIUM 10.3 04/30/2016 1004   PROT 7.5 04/30/2016 1004   ALBUMIN 4.4 04/30/2016 1004   AST 42 (H) 04/30/2016 1004   ALT 47 04/30/2016 1004   ALKPHOS 99 04/30/2016 1004   BILITOT 0.48 04/30/2016 1004   GFRNONAA 87 03/28/2016 1139   GFRAA >89 03/28/2016 1139    INo results found for: SPEP, UPEP  Lab Results  Component Value Date   WBC 2.0 (L) 05/14/2016   NEUTROABS 0.9 (L) 05/14/2016   HGB 11.7 05/14/2016   HCT 33.8 (L) 05/14/2016   MCV 99.9 05/14/2016   PLT 307 05/14/2016      Chemistry      Component Value Date/Time   NA 141 04/30/2016 1004   K 4.6 04/30/2016 1004   CL 106 03/28/2016 1139   CO2 26 04/30/2016 1004   BUN 10.4 04/30/2016 1004   CREATININE 0.8 04/30/2016 1004      Component Value Date/Time   CALCIUM 10.3 04/30/2016 1004   ALKPHOS 99 04/30/2016 1004   AST 42 (H) 04/30/2016 1004   ALT 47 04/30/2016 1004   BILITOT 0.48 04/30/2016 1004       No results found for: LABCA2  No components found for: [id number]  No results for input(s): INR in the last 168 hours.  Urinalysis    Component Value Date/Time   COLORURINE YELLOW 03/21/2007 1108   APPEARANCEUR CLEAR 03/21/2007 1108   LABSPEC 1.021 03/21/2007 1108   PHURINE 7.0 03/21/2007 1108   GLUCOSEU 100 (A) 03/21/2007 1108   HGBUR NEGATIVE 03/21/2007 1108   BILIRUBINUR NEGATIVE 03/21/2007 1108   KETONESUR NEGATIVE 03/21/2007 1108   PROTEINUR NEGATIVE 03/21/2007 1108   UROBILINOGEN 1.0 03/21/2007 1108   NITRITE NEGATIVE 03/21/2007 1108   LEUKOCYTESUR SMALL (A) 03/21/2007 1108     STUDIES: No results found.  ELIGIBLE FOR AVAILABLE RESEARCH PROTOCOL: [facility]  [phone]  ASSESSMENT: 66 y.o. [place] woman status post left breast upper outer quadrant biopsy 12/07/2015 for a clinical T2 N0, stage IIA invasive ductal carcinoma, grade 3, triple negative, with an MIB-1 of 35%  (1) genetics testing 01/23/2016  (a) alpha - anti trypsin and ceruloplamin labs drawn 01/19/2016, both normal  (2)  Status postft lumpectomy/ sentinel lymph node sampling 01/01/2016 for a pT1c pN0, stage IA  Invasive ductal carcinoma, grade 3, with negative margins.  (3) Mammaprint sent from the patient's 12/07/2015 biopsy returned "high risk", predicting a risk of recurrence of nearly 30% untreated, decreasing to a 93% chance of five-year distant metastasis free survival with chemotherapy.  (4) adjuvant chemotherapy consisting of cyclophosphamide and doxorubicin in dose dense fashion 4 started01/04/2016, completed 03/12/2016, followed by weekly Abraxane 12 starting 04/02/2016, changed on 04/30/16 to Abraxane days 1,8,15, with Neulasta support on 28 day cycle  (5) adjuvant radiation to follow  PLAN: Luke is doing well today.  Unfortunately her [place] is 900 and she will not receive treatment today.  The good news is other than her counts, she seems to be tolerating Abraxane well.  She doesn't have any peripheral neuropathy.  She will receive Neupogen today, tomorrow, and Thursday.  We have to be cautious with her immune system due to the fact that she has HIV as well.  Yulissa and I reviewed the plan in detail.  We will switch to two weeks on, one week off of Abraxane after this, with Neulasta on day 9 (onpro).      I reviewed the above with the patient in detail.  She is in agreement with the plan.  She knows to call us prior to her next appt for any questions or concerns whatsoever.    A total of (30) minutes of face-to-face time was spent with this patient with greater than 50% of that time in counseling and care-coordination.   Scot Dock, NP   05/14/2016 9:26 AM Medical  Oncology and Hematology [facility] [address] [place], [place] [address] Tel. [phone]    Fax. [phone]

## 2016-05-15 ENCOUNTER — Ambulatory Visit (HOSPITAL_BASED_OUTPATIENT_CLINIC_OR_DEPARTMENT_OTHER): Payer: BLUE CROSS/BLUE SHIELD

## 2016-05-15 VITALS — BP 123/67 | HR 100 | Temp 98.2°F | Resp 20

## 2016-05-15 DIAGNOSIS — C50412 Malignant neoplasm of upper-outer quadrant of left female breast: Secondary | ICD-10-CM | POA: Diagnosis not present

## 2016-05-15 DIAGNOSIS — D702 Other drug-induced agranulocytosis: Secondary | ICD-10-CM

## 2016-05-15 DIAGNOSIS — D701 Agranulocytosis secondary to cancer chemotherapy: Secondary | ICD-10-CM | POA: Diagnosis not present

## 2016-05-15 DIAGNOSIS — Z171 Estrogen receptor negative status [ER-]: Secondary | ICD-10-CM

## 2016-05-15 MED ORDER — TBO-FILGRASTIM 300 MCG/0.5ML ~~LOC~~ SOSY
300.0000 ug | PREFILLED_SYRINGE | Freq: Once | SUBCUTANEOUS | Status: AC
  Administered 2016-05-15: 300 ug via SUBCUTANEOUS
  Filled 2016-05-15: qty 0.5

## 2016-05-15 NOTE — Patient Instructions (Signed)
Tbo-Filgrastim injection What is this medicine? TBO-FILGRASTIM (T B O fil GRA stim) is a granulocyte colony-stimulating factor that stimulates the growth of neutrophils, a type of white blood cell important in the body's fight against infection. It is used to reduce the incidence of fever and infection in patients with certain types of cancer who are receiving chemotherapy that affects the bone marrow. This medicine may be used for other purposes; ask your health care provider or pharmacist if you have questions. COMMON BRAND NAME(S): Granix What should I tell my health care provider before I take this medicine? They need to know if you have any of these conditions: -bone scan or tests planned -kidney disease -sickle cell anemia -an unusual or allergic reaction to tbo-filgrastim, filgrastim, pegfilgrastim, other medicines, foods, dyes, or preservatives -pregnant or trying to get pregnant -breast-feeding How should I use this medicine? This medicine is for injection under the skin. If you get this medicine at home, you will be taught how to prepare and give this medicine. Refer to the Instructions for Use that come with your medication packaging. Use exactly as directed. Take your medicine at regular intervals. Do not take your medicine more often than directed. It is important that you put your used needles and syringes in a special sharps container. Do not put them in a trash can. If you do not have a sharps container, call your pharmacist or healthcare provider to get one. Talk to your pediatrician regarding the use of this medicine in children. Special care may be needed. Overdosage: If you think you have taken too much of this medicine contact a poison control center or emergency room at once. NOTE: This medicine is only for you. Do not share this medicine with others. What if I miss a dose? It is important not to miss your dose. Call your doctor or health care professional if you miss a  dose. What may interact with this medicine? This medicine may interact with the following medications: -medicines that may cause a release of neutrophils, such as lithium This list may not describe all possible interactions. Give your health care provider a list of all the medicines, herbs, non-prescription drugs, or dietary supplements you use. Also tell them if you smoke, drink alcohol, or use illegal drugs. Some items may interact with your medicine. What should I watch for while using this medicine? You may need blood work done while you are taking this medicine. What side effects may I notice from receiving this medicine? Side effects that you should report to your doctor or health care professional as soon as possible: -allergic reactions like skin rash, itching or hives, swelling of the face, lips, or tongue -blood in the urine -dark urine -dizziness -fast heartbeat -feeling faint -shortness of breath or breathing problems -signs and symptoms of infection like fever or chills; cough; or sore throat -signs and symptoms of kidney injury like trouble passing urine or change in the amount of urine -stomach or side pain, or pain at the shoulder -sweating -swelling of the legs, ankles, or abdomen -tiredness Side effects that usually do not require medical attention (report to your doctor or health care professional if they continue or are bothersome): -bone pain -headache -muscle pain -vomiting This list may not describe all possible side effects. Call your doctor for medical advice about side effects. You may report side effects to FDA at 1-800-FDA-1088. Where should I keep my medicine? Keep out of the reach of children. Store in a refrigerator between   2 and 8 degrees C (36 and 46 degrees F). Keep in carton to protect from light. Throw away this medicine if it is left out of the refrigerator for more than 5 consecutive days. Throw away any unused medicine after the expiration  date. NOTE: This sheet is a summary. It may not cover all possible information. If you have questions about this medicine, talk to your doctor, pharmacist, or health care provider.  2018 Elsevier/Gold Standard (2015-03-06 19:07:04)  

## 2016-05-16 ENCOUNTER — Ambulatory Visit (HOSPITAL_BASED_OUTPATIENT_CLINIC_OR_DEPARTMENT_OTHER): Payer: BLUE CROSS/BLUE SHIELD

## 2016-05-16 VITALS — BP 133/65 | HR 100 | Temp 98.4°F | Resp 18

## 2016-05-16 DIAGNOSIS — D702 Other drug-induced agranulocytosis: Secondary | ICD-10-CM

## 2016-05-16 DIAGNOSIS — C50412 Malignant neoplasm of upper-outer quadrant of left female breast: Secondary | ICD-10-CM | POA: Diagnosis not present

## 2016-05-16 DIAGNOSIS — D701 Agranulocytosis secondary to cancer chemotherapy: Secondary | ICD-10-CM

## 2016-05-16 DIAGNOSIS — Z171 Estrogen receptor negative status [ER-]: Secondary | ICD-10-CM

## 2016-05-16 MED ORDER — TBO-FILGRASTIM 300 MCG/0.5ML ~~LOC~~ SOSY
300.0000 ug | PREFILLED_SYRINGE | Freq: Once | SUBCUTANEOUS | Status: AC
Start: 2016-05-16 — End: 2016-05-16
  Administered 2016-05-16: 300 ug via SUBCUTANEOUS
  Filled 2016-05-16: qty 0.5

## 2016-05-16 NOTE — Patient Instructions (Signed)
Tbo-Filgrastim injection What is this medicine? TBO-FILGRASTIM (T B O fil GRA stim) is a granulocyte colony-stimulating factor that stimulates the growth of neutrophils, a type of white blood cell important in the body's fight against infection. It is used to reduce the incidence of fever and infection in patients with certain types of cancer who are receiving chemotherapy that affects the bone marrow. This medicine may be used for other purposes; ask your health care provider or pharmacist if you have questions. COMMON BRAND NAME(S): Granix What should I tell my health care provider before I take this medicine? They need to know if you have any of these conditions: -bone scan or tests planned -kidney disease -sickle cell anemia -an unusual or allergic reaction to tbo-filgrastim, filgrastim, pegfilgrastim, other medicines, foods, dyes, or preservatives -pregnant or trying to get pregnant -breast-feeding How should I use this medicine? This medicine is for injection under the skin. If you get this medicine at home, you will be taught how to prepare and give this medicine. Refer to the Instructions for Use that come with your medication packaging. Use exactly as directed. Take your medicine at regular intervals. Do not take your medicine more often than directed. It is important that you put your used needles and syringes in a special sharps container. Do not put them in a trash can. If you do not have a sharps container, call your pharmacist or healthcare provider to get one. Talk to your pediatrician regarding the use of this medicine in children. Special care may be needed. Overdosage: If you think you have taken too much of this medicine contact a poison control center or emergency room at once. NOTE: This medicine is only for you. Do not share this medicine with others. What if I miss a dose? It is important not to miss your dose. Call your doctor or health care professional if you miss a  dose. What may interact with this medicine? This medicine may interact with the following medications: -medicines that may cause a release of neutrophils, such as lithium This list may not describe all possible interactions. Give your health care provider a list of all the medicines, herbs, non-prescription drugs, or dietary supplements you use. Also tell them if you smoke, drink alcohol, or use illegal drugs. Some items may interact with your medicine. What should I watch for while using this medicine? You may need blood work done while you are taking this medicine. What side effects may I notice from receiving this medicine? Side effects that you should report to your doctor or health care professional as soon as possible: -allergic reactions like skin rash, itching or hives, swelling of the face, lips, or tongue -blood in the urine -dark urine -dizziness -fast heartbeat -feeling faint -shortness of breath or breathing problems -signs and symptoms of infection like fever or chills; cough; or sore throat -signs and symptoms of kidney injury like trouble passing urine or change in the amount of urine -stomach or side pain, or pain at the shoulder -sweating -swelling of the legs, ankles, or abdomen -tiredness Side effects that usually do not require medical attention (report to your doctor or health care professional if they continue or are bothersome): -bone pain -headache -muscle pain -vomiting This list may not describe all possible side effects. Call your doctor for medical advice about side effects. You may report side effects to FDA at 1-800-FDA-1088. Where should I keep my medicine? Keep out of the reach of children. Store in a refrigerator between   2 and 8 degrees C (36 and 46 degrees F). Keep in carton to protect from light. Throw away this medicine if it is left out of the refrigerator for more than 5 consecutive days. Throw away any unused medicine after the expiration  date. NOTE: This sheet is a summary. It may not cover all possible information. If you have questions about this medicine, talk to your doctor, pharmacist, or health care provider.  2018 Elsevier/Gold Standard (2015-03-06 19:07:04)  

## 2016-05-21 ENCOUNTER — Other Ambulatory Visit: Payer: BLUE CROSS/BLUE SHIELD

## 2016-05-21 ENCOUNTER — Ambulatory Visit (HOSPITAL_BASED_OUTPATIENT_CLINIC_OR_DEPARTMENT_OTHER): Payer: BLUE CROSS/BLUE SHIELD

## 2016-05-21 ENCOUNTER — Encounter: Payer: Self-pay | Admitting: Adult Health

## 2016-05-21 ENCOUNTER — Ambulatory Visit: Payer: BLUE CROSS/BLUE SHIELD

## 2016-05-21 ENCOUNTER — Ambulatory Visit (HOSPITAL_BASED_OUTPATIENT_CLINIC_OR_DEPARTMENT_OTHER): Payer: BLUE CROSS/BLUE SHIELD | Admitting: Adult Health

## 2016-05-21 ENCOUNTER — Telehealth: Payer: Self-pay | Admitting: Oncology

## 2016-05-21 ENCOUNTER — Other Ambulatory Visit (HOSPITAL_BASED_OUTPATIENT_CLINIC_OR_DEPARTMENT_OTHER): Payer: BLUE CROSS/BLUE SHIELD

## 2016-05-21 ENCOUNTER — Ambulatory Visit: Payer: BLUE CROSS/BLUE SHIELD | Admitting: Adult Health

## 2016-05-21 VITALS — BP 119/77 | HR 115 | Temp 98.1°F | Resp 18 | Ht 65.0 in | Wt 148.0 lb

## 2016-05-21 DIAGNOSIS — Z95828 Presence of other vascular implants and grafts: Secondary | ICD-10-CM

## 2016-05-21 DIAGNOSIS — Z171 Estrogen receptor negative status [ER-]: Principal | ICD-10-CM

## 2016-05-21 DIAGNOSIS — Z5111 Encounter for antineoplastic chemotherapy: Secondary | ICD-10-CM | POA: Diagnosis not present

## 2016-05-21 DIAGNOSIS — C50412 Malignant neoplasm of upper-outer quadrant of left female breast: Secondary | ICD-10-CM

## 2016-05-21 DIAGNOSIS — B20 Human immunodeficiency virus [HIV] disease: Secondary | ICD-10-CM

## 2016-05-21 LAB — CBC WITH DIFFERENTIAL/PLATELET
BASO%: 1 % (ref 0.0–2.0)
Basophils Absolute: 0.1 10e3/uL (ref 0.0–0.1)
EOS%: 2.1 % (ref 0.0–7.0)
Eosinophils Absolute: 0.1 10e3/uL (ref 0.0–0.5)
HCT: 39.1 % (ref 34.8–46.6)
HGB: 13.4 g/dL (ref 11.6–15.9)
LYMPH%: 25.2 % (ref 14.0–49.7)
MCH: 34.6 pg — ABNORMAL HIGH (ref 25.1–34.0)
MCHC: 34.1 g/dL (ref 31.5–36.0)
MCV: 101.3 fL — ABNORMAL HIGH (ref 79.5–101.0)
MONO#: 0.9 10e3/uL (ref 0.1–0.9)
MONO%: 14.7 % — ABNORMAL HIGH (ref 0.0–14.0)
NEUT#: 3.6 10e3/uL (ref 1.5–6.5)
NEUT%: 57 % (ref 38.4–76.8)
Platelets: 269 10e3/uL (ref 145–400)
RBC: 3.86 10e6/uL (ref 3.70–5.45)
RDW: 14.9 % — ABNORMAL HIGH (ref 11.2–14.5)
WBC: 6.4 10e3/uL (ref 3.9–10.3)
lymph#: 1.6 10e3/uL (ref 0.9–3.3)

## 2016-05-21 LAB — COMPREHENSIVE METABOLIC PANEL
ALBUMIN: 4.2 g/dL (ref 3.5–5.0)
ALT: 38 U/L (ref 0–55)
ANION GAP: 11 meq/L (ref 3–11)
AST: 39 U/L — AB (ref 5–34)
Alkaline Phosphatase: 100 U/L (ref 40–150)
BUN: 11.6 mg/dL (ref 7.0–26.0)
CALCIUM: 9.8 mg/dL (ref 8.4–10.4)
CHLORIDE: 106 meq/L (ref 98–109)
CO2: 24 mEq/L (ref 22–29)
CREATININE: 0.9 mg/dL (ref 0.6–1.1)
EGFR: 65 mL/min/{1.73_m2} — ABNORMAL LOW (ref >90)
Glucose: 108 mg/dl (ref 70–140)
POTASSIUM: 3.9 meq/L (ref 3.5–5.1)
Sodium: 140 mEq/L (ref 136–145)
Total Bilirubin: 0.27 mg/dL (ref 0.20–1.20)
Total Protein: 7 g/dL (ref 6.4–8.3)

## 2016-05-21 MED ORDER — SODIUM CHLORIDE 0.9% FLUSH
10.0000 mL | INTRAVENOUS | Status: DC | PRN
  Filled 2016-05-21: qty 10

## 2016-05-21 MED ORDER — PROCHLORPERAZINE MALEATE 10 MG PO TABS
ORAL_TABLET | ORAL | Status: AC
  Filled 2016-05-21: qty 1

## 2016-05-21 MED ORDER — SODIUM CHLORIDE 0.9% FLUSH
10.0000 mL | INTRAVENOUS | Status: AC | PRN
  Administered 2016-05-21: 10 mL via INTRAVENOUS
  Filled 2016-05-21: qty 10

## 2016-05-21 MED ORDER — HEPARIN SOD (PORK) LOCK FLUSH 100 UNIT/ML IV SOLN
500.0000 [IU] | Freq: Once | INTRAVENOUS | Status: AC | PRN
  Administered 2016-05-21: 500 [IU]
  Filled 2016-05-21: qty 5

## 2016-05-21 MED ORDER — PROCHLORPERAZINE MALEATE 10 MG PO TABS
10.0000 mg | ORAL_TABLET | Freq: Once | ORAL | Status: AC
  Administered 2016-05-21: 10 mg via ORAL

## 2016-05-21 MED ORDER — SODIUM CHLORIDE 0.9 % IV SOLN
Freq: Once | INTRAVENOUS | Status: AC
  Administered 2016-05-21: 10:00:00 via INTRAVENOUS

## 2016-05-21 MED ORDER — PACLITAXEL PROTEIN-BOUND CHEMO INJECTION 100 MG
100.0000 mg/m2 | Freq: Once | INTRAVENOUS | Status: AC
  Administered 2016-05-21: 175 mg via INTRAVENOUS
  Filled 2016-05-21: qty 35

## 2016-05-21 NOTE — Telephone Encounter (Signed)
Gave relative avs report and appointments for May and June.

## 2016-05-21 NOTE — Progress Notes (Signed)
Pt requested to have labs drawn peripherly

## 2016-05-21 NOTE — Patient Instructions (Signed)
[facility] Discharge Instructions for Patients Receiving Chemotherapy  Today you received the following chemotherapy agents: Abraxane   To help prevent nausea and vomiting after your treatment, we encourage you to take your nausea medication as directed.    If you develop nausea and vomiting that is not controlled by your nausea medication, call the clinic.   BELOW ARE SYMPTOMS THAT SHOULD BE REPORTED IMMEDIATELY:  *FEVER GREATER THAN 100.5 F  *CHILLS WITH OR WITHOUT FEVER  NAUSEA AND VOMITING THAT IS NOT CONTROLLED WITH YOUR NAUSEA MEDICATION  *UNUSUAL SHORTNESS OF BREATH  *UNUSUAL BRUISING OR BLEEDING  TENDERNESS IN MOUTH AND THROAT WITH OR WITHOUT PRESENCE OF ULCERS  *URINARY PROBLEMS  *BOWEL PROBLEMS  UNUSUAL RASH Items with * indicate a potential emergency and should be followed up as soon as possible.  Feel free to call the clinic you have any questions or concerns. The clinic phone number is [phone].  Please show the CHEMO ALERT CARD at check-in to the Emergency Department and triage nurse.   

## 2016-05-21 NOTE — Progress Notes (Signed)
[facility]  [phone] [phone]     ID: [name] DOB: 1950-09-12  MR#: [id number]  FBP#:[id number]  Patient Care Team: Carlyle Basques, MD as PCP - General (Infectious Diseases) Carlyle Basques, MD as PCP - Infectious Diseases (Infectious Diseases) Alphonsa Overall, MD as Consulting Physician (General Surgery) Chauncey Cruel, MD as Consulting Physician (Oncology) [name] Pray, MD as Consulting Physician (Radiation Oncology) [name] Pacas, MD as Consulting Physician (Neurology) [name] Brownie, MD as Consulting Physician (Dermatology) [name] Phlegm, NP as Nurse Practitioner (Hematology and Oncology) Scot Dock, NP OTHER MD:  CHIEF COMPLAINT: Triple negative breast cancer  CURRENT TREATMENT: Adjuvant chemotherapy   BREAST CANCER HISTORY:  From the origil intake note:  [name] herself palpated a mass in the upper outer quadrant of her left breast the first week in November, and had bilateral diagnostic mammography with tomography and left breast ultrasonography at the [facility] 12/05/2015. The breast density was category C. In the upper outer left breast there was  A mass with macro lobulated contours which was barely palpable at the 12:30 o'clock position of the left breast 7 cm from the nipple. There was no palpable axillary adenopathy. Ultrasonography confirmed a 2.1 cm obliquely oriented hypoechoic mass in the area in question. Ultrasound of the left axilla was benign.  Biopsy of the left breast mass in question 12/07/2015 showed ([id number]) invasive ductal carcinoma, grade 3, estrogen and progesterone receptor negative, with an MIB-1 of 35%, and no HER-2 amplification, the signals ratio being 0.95 and the number per cell 2.80.  Her subsequent history is as detailed below  INTERVAL HISTORY: [name] is here today prior to receiving Abraxane.  She will receive 12 weeks of Abraxane, however she is receiving this on days 1, 8  on a 21 day cycle with Neulasta support due to her counts.  Today will be #6.  She missed her last week of treatment due to neutropenia (which is why we switched from days 1, 8, 15 to days 1 and 8).  She had some bone pain from the neupogen, but otherwise tolerated it well and her labs are improved today.  She does have mild vertigo.  She manages this with epley maneuvers.  She has had this before.    REVIEW OF SYSTEMS: A detailed ROS was conducted and is negative/non contributory except what is noted above.   PAST MEDICAL HISTORY: Past Medical History:  Diagnosis Date  . HIV (human immunodeficiency virus infection) ([place])   . Hypertension   . Malignant neoplasm of upper-outer quadrant of left female breast ([facility]) 12/11/2015  . Migraine   . Neuropathy   . PONV (postoperative nausea and vomiting)    said usually has to have scop patch  . Rash and nonspecific skin eruption 10/16/2015  . Spinal stenosis     PAST SURGICAL HISTORY: Past Surgical History:  Procedure Laterality Date  . ABDOMINAL HYSTERECTOMY  1990  . BREAST LUMPECTOMY WITH RADIOACTIVE SEED AND SENTINEL LYMPH NODE BIOPSY Left 01/01/2016   Procedure: LEFT BREAST LUMPECTOMY WITH RADIOACTIVE SEED AND SENTINEL LYMPH NODE BIOPSY;  Surgeon: Alphonsa Overall, MD;  Location: [facility];  Service: General;  Laterality: Left;  . CARPAL TUNNEL RELEASE     both hands  . CERVICAL FUSION  1989  . PORTACATH PLACEMENT Right 01/01/2016   Procedure: INSERTION PORT-A-CATH WITH [name];  Surgeon: Alphonsa Overall, MD;  Location: [facility];  Service: General;  Laterality: Right;  . TONSILLECTOMY  FAMILY HISTORY Family History  Problem Relation Age of Onset  . High blood pressure Mother   . High Cholesterol Mother   . Cancer Father   . High blood pressure Father   . High Cholesterol Father   . Lung cancer Maternal Grandfather   . Breast cancer Maternal Aunt 49  The patient's father died from heart disease at the age of  79. The patient's mother died from "genetic cirrhosis" at the age of 71. The patient had 2 brothers, 5 sisters. A maternal aunt was diagnosed with breast cancer at age 68. There is no other history of breast or ovarian cancer in the family to the patient's knowledge   GYNECOLOGIC HISTORY:  No LMP recorded. Patient has had a hysterectomy. Menarche age 38, first live birth age 81. Patient is GX P1. She underwent hysterectomy with bilateral salpingo-oophorectomy at age 42. She took hormone replacement for approximately 10 years, until age 66. She also had oral contraceptives remotely for 8-10 years, with no complications.   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 At home is just she and her husband [name] who is retired from Animator work. Their son [name] works as a Surveyor, minerals for rest home in [place]. The patient has 3 grandchildren. She is not a Advice worker.   ADVANCED DIRECTIVES: The patient has a living will in place   HEALTH MAINTENANCE: Social History  Substance Use Topics  . Smoking status: Never Smoker  . Smokeless tobacco: Never Used  . Alcohol use 0.6 oz/week    1 Glasses of wine per week     Comment: on weekends     Colonoscopy: 2012/be routinely  PAP: Status post hysterectomy  Bone density: Never   No Known Allergies  Current Outpatient Prescriptions  Medication Sig Dispense Refill  . amLODipine (NORVASC) 5 MG tablet Take 1 tablet (5 mg total) by mouth daily. 30 tablet 5  . benazepril (LOTENSIN) 40 MG tablet Take 1 tablet (40 mg total) by mouth daily. 30 tablet 5  . Coenzyme Q10 300 MG CAPS Take 300 capsules by mouth daily.    . cyclobenzaprine (FLEXERIL) 10 MG tablet Take 1 tablet (10 mg total) by mouth 3 (three) times daily as needed for muscle spasms. 90 tablet 3  . DESCOVY 200-25 MG tablet TAKE ONE TABLET BY MOUTH EVERY DAY 30 tablet 5  . flunisolide (NASALIDE) 25 MCG/ACT (0.025%) SOLN Place 2 sprays into the nose 2 (two)  times daily. 1 Bottle 3  . gabapentin (NEURONTIN) 100 MG capsule Take 1 capsule (100 mg total) by mouth 2 (two) times daily. 180 capsule 1  . lidocaine-prilocaine (EMLA) cream APPLY TO AFFECTED AREA(S) EVERY DAY  3  . LORazepam (ATIVAN) 0.5 MG tablet Take 0.5 mg by mouth every 6 (six) hours as needed.  0  . Multiple Vitamin (MULTIVITAMIN WITH MINERALS) TABS tablet Take 1 tablet by mouth daily.    Marland Kitchen nystatin (MYCOSTATIN) 100000 UNIT/ML suspension Take 5 mLs (500,000 Units total) by mouth 4 (four) times daily. (Patient not taking: Reported on 05/14/2016) 473 mL 0  . Omega-3 Fatty Acids (FISH OIL) 1200 MG CAPS Take 1,200 mg by mouth 2 (two) times daily.      . prochlorperazine (COMPAZINE) 10 MG tablet Take 1 tablet (10 mg total) by mouth every 6 (six) hours as needed (Nausea or vomiting). 30 tablet 1  . rosuvastatin (CRESTOR) 5 MG tablet Take 1 tablet (5 mg total) by mouth at bedtime. 30 tablet  5  . sulfamethoxazole-trimethoprim (BACTRIM,SEPTRA) 400-80 MG tablet TAKE ONE TABLET BY MOUTH EVERY DAY 30 tablet 2  . TIVICAY 50 MG tablet TAKE ONE TABLET BY MOUTH EVERY DAY 30 tablet 5   No current facility-administered medications for this visit.    Facility-Administered Medications Ordered in Other Visits  Medication Dose Route Frequency Provider Last Rate Last Dose  . sodium chloride flush (NS) 0.9 % injection 10 mL  10 mL Intravenous PRN Chauncey Cruel, MD        OBJECTIVE:  Vitals:   05/21/16 0807  BP: 119/77  Pulse: (!) 115  Resp: 18  Temp: 98.1 F (36.7 C)     Body mass index is 24.63 kg/m.    ECOG FS:1 - Symptomatic but completely ambulatory  GENERAL: Patient is a well appearing female in no acute distress HEENT:  Sclerae anicteric. PERRL. Oropharynx clear and moist. No ulcerations or evidence of oropharyngeal candidiasis. Neck is supple.  NODES:  No cervical, supraclavicular, or axillary lymphadenopathy palpated.  BREAST EXAM:  Deferred. LUNGS:  Clear to auscultation bilaterally.   No wheezes or rhonchi. HEART:  Regular rate (100) and rhythm. No murmur appreciated. ABDOMEN:  Soft, nontender.  Positive, normoactive bowel sounds. No organomegaly palpated. MSK:  No focal spinal tenderness to palpation. Full range of motion bilaterally in the upper extremities. EXTREMITIES:  No peripheral edema.   SKIN:  Clear with no obvious rashes or skin changes. No nail dyscrasia. NEURO:  Nonfocal. Well oriented.  Appropriate affect.      LAB RESULTS:  CMP     Component Value Date/Time   NA 143 05/14/2016 0854   K 4.3 05/14/2016 0854   CL 106 03/28/2016 1139   CO2 25 05/14/2016 0854   GLUCOSE 89 05/14/2016 0854   BUN 13.3 05/14/2016 0854   CREATININE 0.9 05/14/2016 0854   CALCIUM 9.8 05/14/2016 0854   PROT 6.8 05/14/2016 0854   ALBUMIN 4.2 05/14/2016 0854   AST 43 (H) 05/14/2016 0854   ALT 45 05/14/2016 0854   ALKPHOS 79 05/14/2016 0854   BILITOT 0.40 05/14/2016 0854   GFRNONAA 87 03/28/2016 1139   GFRAA >89 03/28/2016 1139    INo results found for: SPEP, UPEP  Lab Results  Component Value Date   WBC 6.4 05/21/2016   NEUTROABS 3.6 05/21/2016   HGB 13.4 05/21/2016   HCT 39.1 05/21/2016   MCV 101.3 (H) 05/21/2016   PLT 269 05/21/2016      Chemistry      Component Value Date/Time   NA 143 05/14/2016 0854   K 4.3 05/14/2016 0854   CL 106 03/28/2016 1139   CO2 25 05/14/2016 0854   BUN 13.3 05/14/2016 0854   CREATININE 0.9 05/14/2016 0854      Component Value Date/Time   CALCIUM 9.8 05/14/2016 0854   ALKPHOS 79 05/14/2016 0854   AST 43 (H) 05/14/2016 0854   ALT 45 05/14/2016 0854   BILITOT 0.40 05/14/2016 0854       No results found for: LABCA2  No components found for: LABCA125  No results for input(s): INR in the last 168 hours.  Urinalysis    Component Value Date/Time   COLORURINE YELLOW 03/21/2007 1108   APPEARANCEUR CLEAR 03/21/2007 1108   LABSPEC 1.021 03/21/2007 1108   PHURINE 7.0 03/21/2007 1108   GLUCOSEU 100 (A) 03/21/2007 1108    HGBUR NEGATIVE 03/21/2007 1108   BILIRUBINUR NEGATIVE 03/21/2007 1108   [place] 03/21/2007 [place] 03/21/2007 1108  UROBILINOGEN 1.0 03/21/2007 1108   NITRITE NEGATIVE 03/21/2007 1108   LEUKOCYTESUR SMALL (A) 03/21/2007 1108     STUDIES: No results found.  ELIGIBLE FOR AVAILABLE RESEARCH PROTOCOL: [facility] [phone]  ASSESSMENT: 66 y.o. [place] woman status post left breast upper outer quadrant biopsy 12/07/2015 for a clinical T2 N0, stage IIA invasive ductal carcinoma, grade 3, triple negative, with an MIB-1 of 35%  (1) genetics testing 01/23/2016  (a) alpha - anti trypsin and ceruloplamin labs drawn 01/19/2016, both normal  (2) Status postft lumpectomy/ sentinel lymph node sampling 01/01/2016 for a pT1c pN0, stage IA  Invasive ductal carcinoma, grade 3, with negative margins.  (3) Mammaprint sent from the patient's 12/07/2015 biopsy returned "high risk", predicting a risk of recurrence of nearly 30% untreated, decreasing to a 93% chance of five-year distant metastasis free survival with chemotherapy.  (4) adjuvant chemotherapy consisting of cyclophosphamide and doxorubicin in dose dense fashion 4 started01/04/2016, completed 03/12/2016, followed by weekly Abraxane 12 starting 04/02/2016, changed on 04/30/16 to Abraxane days 1,8,15, with Neulasta support on 28 day cycle, then changed on 4/17 to Abraxane days 1, 8, on a 21 day cycle with Neulasta due to neutropenia  (5) adjuvant radiation to follow  PLAN:  Jossilyn will proceed with chemotherapy today.  CMET pending.  She continues to tolerate it well without any peripheral neuropathy.  She and I updated her treatment plan and appointments today.  She will continue epley maneuvers for her vertigo.    I reviewed the above with the patient in detail.  She is in agreement with the plan.  She knows to call us prior to her next appt for any questions or concerns whatsoever.    A total of (30) minutes of  face-to-face time was spent with this patient with greater than 50% of that time in counseling and care-coordination.   Scot Dock, NP   05/21/2016 8:19 AM Medical Oncology and Hematology [facility] [address] [place], [place] [address] Tel. [phone]    Fax. [phone]

## 2016-05-21 NOTE — Patient Instructions (Signed)
Implanted Port Home Guide An implanted port is a type of central line that is placed under the skin. Central lines are used to provide IV access when treatment or nutrition needs to be given through a person's veins. Implanted ports are used for long-term IV access. An implanted port may be placed because:  You need IV medicine that would be irritating to the small veins in your hands or arms.  You need long-term IV medicines, such as antibiotics.  You need IV nutrition for a long period.  You need frequent blood draws for lab tests.  You need dialysis.  Implanted ports are usually placed in the chest area, but they can also be placed in the upper arm, the abdomen, or the leg. An implanted port has two main parts:  Reservoir. The reservoir is round and will appear as a small, raised area under your skin. The reservoir is the part where a needle is inserted to give medicines or draw blood.  Catheter. The catheter is a thin, flexible tube that extends from the reservoir. The catheter is placed into a large vein. Medicine that is inserted into the reservoir goes into the catheter and then into the vein.  How will I care for my incision site? Do not get the incision site wet. Bathe or shower as directed by your health care provider. How is my port accessed? Special steps must be taken to access the port:  Before the port is accessed, a numbing cream can be placed on the skin. This helps numb the skin over the port site.  Your health care provider uses a sterile technique to access the port. ? Your health care provider must put on a mask and sterile gloves. ? The skin over your port is cleaned carefully with an antiseptic and allowed to dry. ? The port is gently pinched between sterile gloves, and a needle is inserted into the port.  Only "non-coring" port needles should be used to access the port. Once the port is accessed, a blood return should be checked. This helps ensure that the port  is in the vein and is not clogged.  If your port needs to remain accessed for a constant infusion, a clear (transparent) bandage will be placed over the needle site. The bandage and needle will need to be changed every week, or as directed by your health care provider.  Keep the bandage covering the needle clean and dry. Do not get it wet. Follow your health care provider's instructions on how to take a shower or bath while the port is accessed.  If your port does not need to stay accessed, no bandage is needed over the port.  What is flushing? Flushing helps keep the port from getting clogged. Follow your health care provider's instructions on how and when to flush the port. Ports are usually flushed with saline solution or a medicine called heparin. The need for flushing will depend on how the port is used.  If the port is used for intermittent medicines or blood draws, the port will need to be flushed: ? After medicines have been given. ? After blood has been drawn. ? As part of routine maintenance.  If a constant infusion is running, the port may not need to be flushed.  How long will my port stay implanted? The port can stay in for as long as your health care provider thinks it is needed. When it is time for the port to come out, surgery will be   done to remove it. The procedure is similar to the one performed when the port was put in. When should I seek immediate medical care? When you have an implanted port, you should seek immediate medical care if:  You notice a bad smell coming from the incision site.  You have swelling, redness, or drainage at the incision site.  You have more swelling or pain at the port site or the surrounding area.  You have a fever that is not controlled with medicine.  This information is not intended to replace advice given to you by your health care provider. Make sure you discuss any questions you have with your health care provider. Document  Released: 01/14/2005 Document Revised: 06/22/2015 Document Reviewed: 09/21/2012 Elsevier Interactive Patient Education  2017 [employer]  

## 2016-05-28 ENCOUNTER — Other Ambulatory Visit (HOSPITAL_BASED_OUTPATIENT_CLINIC_OR_DEPARTMENT_OTHER): Payer: BLUE CROSS/BLUE SHIELD

## 2016-05-28 ENCOUNTER — Encounter: Payer: Self-pay | Admitting: *Deleted

## 2016-05-28 ENCOUNTER — Ambulatory Visit (HOSPITAL_BASED_OUTPATIENT_CLINIC_OR_DEPARTMENT_OTHER): Payer: BLUE CROSS/BLUE SHIELD

## 2016-05-28 ENCOUNTER — Ambulatory Visit (HOSPITAL_BASED_OUTPATIENT_CLINIC_OR_DEPARTMENT_OTHER): Payer: BLUE CROSS/BLUE SHIELD | Admitting: Adult Health

## 2016-05-28 ENCOUNTER — Encounter: Payer: Self-pay | Admitting: Adult Health

## 2016-05-28 VITALS — BP 121/82 | HR 100 | Temp 97.8°F | Resp 18 | Wt 151.3 lb

## 2016-05-28 DIAGNOSIS — Z171 Estrogen receptor negative status [ER-]: Secondary | ICD-10-CM

## 2016-05-28 DIAGNOSIS — D701 Agranulocytosis secondary to cancer chemotherapy: Secondary | ICD-10-CM

## 2016-05-28 DIAGNOSIS — C50412 Malignant neoplasm of upper-outer quadrant of left female breast: Secondary | ICD-10-CM

## 2016-05-28 DIAGNOSIS — B20 Human immunodeficiency virus [HIV] disease: Secondary | ICD-10-CM

## 2016-05-28 DIAGNOSIS — Z5111 Encounter for antineoplastic chemotherapy: Secondary | ICD-10-CM | POA: Diagnosis not present

## 2016-05-28 LAB — CBC WITH DIFFERENTIAL/PLATELET
BASO%: 2.9 % — ABNORMAL HIGH (ref 0.0–2.0)
BASOS ABS: 0.1 10*3/uL (ref 0.0–0.1)
EOS ABS: 0.1 10*3/uL (ref 0.0–0.5)
EOS%: 2.9 % (ref 0.0–7.0)
HEMATOCRIT: 36 % (ref 34.8–46.6)
HEMOGLOBIN: 12 g/dL (ref 11.6–15.9)
LYMPH#: 1 10*3/uL (ref 0.9–3.3)
LYMPH%: 42.7 % (ref 14.0–49.7)
MCH: 33 pg (ref 25.1–34.0)
MCHC: 33.3 g/dL (ref 31.5–36.0)
MCV: 98.9 fL (ref 79.5–101.0)
MONO#: 0.2 10*3/uL (ref 0.1–0.9)
MONO%: 7.5 % (ref 0.0–14.0)
NEUT#: 1.1 10*3/uL — ABNORMAL LOW (ref 1.5–6.5)
NEUT%: 44 % (ref 38.4–76.8)
PLATELETS: 270 10*3/uL (ref 145–400)
RBC: 3.64 10*6/uL — ABNORMAL LOW (ref 3.70–5.45)
RDW: 13.6 % (ref 11.2–14.5)
WBC: 2.4 10*3/uL — ABNORMAL LOW (ref 3.9–10.3)

## 2016-05-28 MED ORDER — PEGFILGRASTIM 6 MG/0.6ML ~~LOC~~ PSKT
6.0000 mg | PREFILLED_SYRINGE | Freq: Once | SUBCUTANEOUS | Status: AC
  Administered 2016-05-28: 6 mg via SUBCUTANEOUS
  Filled 2016-05-28: qty 0.6

## 2016-05-28 MED ORDER — PACLITAXEL PROTEIN-BOUND CHEMO INJECTION 100 MG
100.0000 mg/m2 | Freq: Once | INTRAVENOUS | Status: AC
  Administered 2016-05-28: 175 mg via INTRAVENOUS
  Filled 2016-05-28: qty 35

## 2016-05-28 MED ORDER — HEPARIN SOD (PORK) LOCK FLUSH 100 UNIT/ML IV SOLN
500.0000 [IU] | Freq: Once | INTRAVENOUS | Status: AC | PRN
  Administered 2016-05-28: 500 [IU]
  Filled 2016-05-28: qty 5

## 2016-05-28 MED ORDER — SODIUM CHLORIDE 0.9 % IV SOLN
Freq: Once | INTRAVENOUS | Status: AC
  Administered 2016-05-28: 10:00:00 via INTRAVENOUS

## 2016-05-28 MED ORDER — SODIUM CHLORIDE 0.9% FLUSH
10.0000 mL | INTRAVENOUS | Status: DC | PRN
  Administered 2016-05-28: 10 mL
  Filled 2016-05-28: qty 10

## 2016-05-28 MED ORDER — PROCHLORPERAZINE MALEATE 10 MG PO TABS
10.0000 mg | ORAL_TABLET | Freq: Once | ORAL | Status: AC
  Administered 2016-05-28: 10 mg via ORAL

## 2016-05-28 MED ORDER — PROCHLORPERAZINE MALEATE 10 MG PO TABS
ORAL_TABLET | ORAL | Status: AC
  Filled 2016-05-28: qty 1

## 2016-05-28 NOTE — Patient Instructions (Signed)
[facility] Discharge Instructions for Patients Receiving Chemotherapy  Today you received the following chemotherapy agents ABRAXANE  To help prevent nausea and vomiting after your treatment, we encourage you to take your nausea medication as prescribed.  If you develop nausea and vomiting that is not controlled by your nausea medication, call the clinic.   BELOW ARE SYMPTOMS THAT SHOULD BE REPORTED IMMEDIATELY:  *FEVER GREATER THAN 100.5 F  *CHILLS WITH OR WITHOUT FEVER  NAUSEA AND VOMITING THAT IS NOT CONTROLLED WITH YOUR NAUSEA MEDICATION  *UNUSUAL SHORTNESS OF BREATH  *UNUSUAL BRUISING OR BLEEDING  TENDERNESS IN MOUTH AND THROAT WITH OR WITHOUT PRESENCE OF ULCERS  *URINARY PROBLEMS  *BOWEL PROBLEMS  UNUSUAL RASH Items with * indicate a potential emergency and should be followed up as soon as possible.  Feel free to call the clinic you have any questions or concerns. The clinic phone number is [phone].  Please show the [facility] at check-in to the Emergency Department and triage nurse.

## 2016-05-28 NOTE — Progress Notes (Signed)
OK to treat today despite low ANC per Wilber Bihari NP.  Order repeated & verified.

## 2016-05-28 NOTE — Progress Notes (Signed)
[place]  Telephone:([phone]) [phone] Fax:(336) [phone]     ID: [name] DOB: 1950/01/31  MR#: [id number]  ACZ#:[id number]  Patient Care Team: [name] Basques, MD as PCP - General (Infectious Diseases) [name] Basques, MD as PCP - Infectious Diseases (Infectious Diseases) [name] Overall, MD as Consulting Physician (General Surgery) [name] Cruel, MD as Consulting Physician (Oncology) [name] Pray, MD as Consulting Physician (Radiation Oncology) [name] Pacas, MD as Consulting Physician (Neurology) [name] Brownie, MD as Consulting Physician (Dermatology) [name] Phlegm, NP as Nurse Practitioner (Hematology and Oncology) [name] Dock, NP OTHER MD:  CHIEF COMPLAINT: Triple negative breast cancer  CURRENT TREATMENT: Adjuvant chemotherapy   BREAST CANCER HISTORY:  From the origil intake note:  [name] herself palpated a mass in the upper outer quadrant of her left breast the first week in November, and had bilateral diagnostic mammography with tomography and left breast ultrasonography at the [facility] 12/05/2015. The breast density was category C. In the upper outer left breast there was  A mass with macro lobulated contours which was barely palpable at the 12:30 o'clock position of the left breast 7 cm from the nipple. There was no palpable axillary adenopathy. Ultrasonography confirmed a 2.1 cm obliquely oriented hypoechoic mass in the area in question. Ultrasound of the left axilla was benign.  Biopsy of the left breast mass in question 12/07/2015 showed ([id number]) invasive ductal carcinoma, grade 3, estrogen and progesterone receptor negative, with an MIB-1 of 35%, and no HER-2 amplification, the signals ratio being 0.95 and the number per cell 2.80.  Her subsequent history is as detailed below  INTERVAL HISTORY: [name] is here today prior to receiving Abraxane.  She will receive 12 weeks of Abraxane, however she is receiving this on days 1, 8  on a 21 day cycle with Neulasta support due to her counts.  Today will be #7.  She is doing well today.  She denies fevers, chills, nausea, vomiting.  She denies peripheral neuropathy.  She is getting fatigued.  Despite fatigue, she stays active.  She takes a daily nap from 12-2 in the afternoon, then sleeps from 10pm to 7 am.  This works well for her.    REVIEW OF SYSTEMS: A detailed ROS was conducted and is negative/non contributory except what is noted above.   PAST MEDICAL HISTORY: Past Medical History:  Diagnosis Date  . HIV (human immunodeficiency virus infection) ([place])   . Hypertension   . Malignant neoplasm of upper-outer quadrant of left female breast ([place]) 12/11/2015  . Migraine   . Neuropathy   . PONV (postoperative nausea and vomiting)    said usually has to have scop patch  . Rash and nonspecific skin eruption 10/16/2015  . Spinal stenosis     PAST SURGICAL HISTORY: Past Surgical History:  Procedure Laterality Date  . ABDOMINAL HYSTERECTOMY  1990  . BREAST LUMPECTOMY WITH RADIOACTIVE SEED AND SENTINEL LYMPH NODE BIOPSY Left 01/01/2016   Procedure: LEFT BREAST LUMPECTOMY WITH RADIOACTIVE SEED AND SENTINEL LYMPH NODE BIOPSY;  Surgeon: [name] Overall, MD;  Location: [facility];  Service: General;  Laterality: Left;  . CARPAL TUNNEL RELEASE     both hands  . CERVICAL FUSION  1989  . PORTACATH PLACEMENT Right 01/01/2016   Procedure: INSERTION PORT-A-CATH WITH [name];  Surgeon: [name] Overall, MD;  Location: [facility];  Service: General;  Laterality: Right;  . TONSILLECTOMY      FAMILY HISTORY Family History  Problem Relation Age of Onset  .  High blood pressure Mother   . High Cholesterol Mother   . Cancer Father   . High blood pressure Father   . High Cholesterol Father   . Lung cancer Maternal Grandfather   . Breast cancer Maternal Aunt 26  The patient's father died from heart disease at the age of 9. The patient's mother died from "genetic  cirrhosis" at the age of 31. The patient had 2 brothers, 5 sisters. A maternal aunt was diagnosed with breast cancer at age 60. There is no other history of breast or ovarian cancer in the family to the patient's knowledge   GYNECOLOGIC HISTORY:  No LMP recorded. Patient has had a hysterectomy. Menarche age 64, first live birth age 16. Patient is GX P1. She underwent hysterectomy with bilateral salpingo-oophorectomy at age 48. She took hormone replacement for approximately 10 years, until age 33. She also had oral contraceptives remotely for 8-10 years, with no complications.   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 At home is just she and her husband [name] who is retired from Teaching laboratory technician work. Their son [name] works as a Chief Strategy Officer for rest home in [place]. The patient has 3 grandchildren. She is not a Ambulance person.   ADVANCED DIRECTIVES: The patient has a living will in place   HEALTH MAINTENANCE: Social History  Substance Use Topics  . Smoking status: Never Smoker  . Smokeless tobacco: Never Used  . Alcohol use 0.6 oz/week    1 Glasses of wine per week     Comment: on weekends     Colonoscopy: 2012/be routinely  PAP: Status post hysterectomy  Bone density: Never   No Known Allergies  Current Outpatient Prescriptions  Medication Sig Dispense Refill  . amLODipine (NORVASC) 5 MG tablet Take 1 tablet (5 mg total) by mouth daily. 30 tablet 5  . benazepril (LOTENSIN) 40 MG tablet Take 1 tablet (40 mg total) by mouth daily. 30 tablet 5  . Coenzyme Q10 300 MG CAPS Take 300 capsules by mouth daily.    . cyclobenzaprine (FLEXERIL) 10 MG tablet Take 1 tablet (10 mg total) by mouth 3 (three) times daily as needed for muscle spasms. 90 tablet 3  . DESCOVY 200-25 MG tablet TAKE ONE TABLET BY MOUTH EVERY DAY 30 tablet 5  . flunisolide (NASALIDE) 25 MCG/ACT (0.025%) SOLN Place 2 sprays into the nose 2 (two) times daily. 1 Bottle 3  . gabapentin  (NEURONTIN) 100 MG capsule Take 1 capsule (100 mg total) by mouth 2 (two) times daily. 180 capsule 1  . lidocaine-prilocaine (EMLA) cream APPLY TO AFFECTED AREA(S) EVERY DAY  3  . LORazepam (ATIVAN) 0.5 MG tablet Take 0.5 mg by mouth every 6 (six) hours as needed.  0  . Multiple Vitamin (MULTIVITAMIN WITH MINERALS) TABS tablet Take 1 tablet by mouth daily.    . Omega-3 Fatty Acids (FISH OIL) 1200 MG CAPS Take 1,200 mg by mouth 2 (two) times daily.      . prochlorperazine (COMPAZINE) 10 MG tablet Take 1 tablet (10 mg total) by mouth every 6 (six) hours as needed (Nausea or vomiting). 30 tablet 1  . rosuvastatin (CRESTOR) 5 MG tablet Take 1 tablet (5 mg total) by mouth at bedtime. 30 tablet 5  . sulfamethoxazole-trimethoprim (BACTRIM,SEPTRA) 400-80 MG tablet TAKE ONE TABLET BY MOUTH EVERY DAY 30 tablet 2  . TIVICAY 50 MG tablet TAKE ONE TABLET BY MOUTH EVERY DAY 30 tablet 5  . nystatin (MYCOSTATIN) 100000 UNIT/ML  suspension Take 5 mLs (500,000 Units total) by mouth 4 (four) times daily. (Patient not taking: Reported on 05/14/2016) 473 mL 0   No current facility-administered medications for this visit.    Facility-Administered Medications Ordered in Other Visits  Medication Dose Route Frequency Provider Last Rate Last Dose  . sodium chloride flush (NS) 0.9 % injection 10 mL  10 mL Intravenous PRN [name] Cruel, MD   10 mL at 05/21/16 1022    OBJECTIVE:  Vitals:   05/28/16 0810  BP: 121/82  Pulse: 100  Resp: 18  Temp: 97.8 F (36.6 C)     Body mass index is 25.18 kg/m.    ECOG FS:1 - Symptomatic but completely ambulatory GENERAL: Patient is a well appearing female in no acute distress HEENT:  Sclerae anicteric.  Oropharynx clear and moist. No ulcerations or evidence of oropharyngeal candidiasis. Neck is supple.  NODES:  No cervical, supraclavicular, or axillary lymphadenopathy palpated.  BREAST EXAM:  Deferred. LUNGS:  Clear to auscultation bilaterally.  No wheezes or rhonchi. HEART:   Regular rate and rhythm. No murmur appreciated. ABDOMEN:  Soft, nontender.  Positive, normoactive bowel sounds. No organomegaly palpated. MSK:  No focal spinal tenderness to palpation. Full range of motion bilaterally in the upper extremities. EXTREMITIES:  No peripheral edema.   SKIN:  Clear with no obvious rashes or skin changes. No nail dyscrasia. NEURO:  Nonfocal. Well oriented.  Appropriate affect.       LAB RESULTS:  CMP     Component Value Date/Time   NA 140 05/21/2016 0745   K 3.9 05/21/2016 0745   CL 106 03/28/2016 1139   CO2 24 05/21/2016 0745   GLUCOSE 108 05/21/2016 0745   BUN 11.6 05/21/2016 0745   CREATININE 0.9 05/21/2016 0745   CALCIUM 9.8 05/21/2016 0745   PROT 7.0 05/21/2016 0745   ALBUMIN 4.2 05/21/2016 0745   AST 39 (H) 05/21/2016 0745   ALT 38 05/21/2016 0745   ALKPHOS 100 05/21/2016 0745   BILITOT 0.27 05/21/2016 0745   GFRNONAA 87 03/28/2016 1139   GFRAA >89 03/28/2016 1139    INo results found for: SPEP, UPEP  Lab Results  Component Value Date   WBC 2.4 (L) 05/28/2016   NEUTROABS 1.1 (L) 05/28/2016   HGB 12.0 05/28/2016   HCT 36.0 05/28/2016   MCV 98.9 05/28/2016   PLT 270 05/28/2016      Chemistry      Component Value Date/Time   NA 140 05/21/2016 0745   K 3.9 05/21/2016 0745   CL 106 03/28/2016 1139   CO2 24 05/21/2016 0745   BUN 11.6 05/21/2016 0745   CREATININE 0.9 05/21/2016 0745      Component Value Date/Time   CALCIUM 9.8 05/21/2016 0745   ALKPHOS 100 05/21/2016 0745   AST 39 (H) 05/21/2016 0745   ALT 38 05/21/2016 0745   BILITOT 0.27 05/21/2016 0745       No results found for: LABCA2  No components found for: LABCA125  No results for input(s): INR in the last 168 hours.  Urinalysis    Component Value Date/Time   COLORURINE YELLOW 03/21/2007 1108   APPEARANCEUR CLEAR 03/21/2007 1108   LABSPEC 1.021 03/21/2007 1108   PHURINE 7.0 03/21/2007 1108   GLUCOSEU 100 (A) 03/21/2007 1108   HGBUR NEGATIVE 03/21/2007  1108   BILIRUBINUR NEGATIVE 03/21/2007 1108   KETONESUR NEGATIVE 03/21/2007 1108   PROTEINUR NEGATIVE 03/21/2007 1108   UROBILINOGEN 1.0 03/21/2007 1108   NITRITE NEGATIVE 03/21/2007 1108  LEUKOCYTESUR SMALL (A) 03/21/2007 1108     STUDIES: No results found.  ELIGIBLE FOR AVAILABLE RESEARCH PROTOCOL: [facility] [phone]  ASSESSMENT: 65 y.o. [name] woman status post left breast upper outer quadrant biopsy 12/07/2015 for a clinical T2 N0, stage IIA invasive ductal carcinoma, grade 3, triple negative, with an MIB-1 of 35%  (1) genetics testing 01/23/2016  (a) alpha - anti trypsin and ceruloplamin labs drawn 01/19/2016, both normal  (2) Status postft lumpectomy/ sentinel lymph node sampling 01/01/2016 for a pT1c pN0, stage IA  Invasive ductal carcinoma, grade 3, with negative margins.  (3) Mammaprint sent from the patient's 12/07/2015 biopsy returned "high risk", predicting a risk of recurrence of nearly 30% untreated, decreasing to a 93% chance of five-year distant metastasis free survival with chemotherapy.  (4) adjuvant chemotherapy consisting of cyclophosphamide and doxorubicin in dose dense fashion 4 started01/04/2016, completed 03/12/2016, followed by weekly Abraxane 12 starting 04/02/2016, changed on 04/30/16 to Abraxane days 1,8,15, with Neulasta support on 28 day cycle, then changed on 4/17 to Abraxane days 1, 8, on a 21 day cycle with Neulasta on day 9 due to neutropenia  (5) adjuvant radiation to follow  PLAN: Tayana continues to do well today.  She is fatigued, but sounds like she is managing her symptoms as good as she can be.  I reviewed her cbc with her.  Her ANC is 1.1.  I reviewed this with Dr. Jana Hakim and she will proceed with her Abraxane today, and receive Onpro.  She will return in 2 weeks for labs, an appointment with Dr. Jana Hakim, and #8 of Abraxane.     I reviewed the above with the patient in detail.  She is in agreement with the plan.  She knows to call us prior  to her next appt for any questions or concerns whatsoever.    A total of (30) minutes of face-to-face time was spent with this patient with greater than 50% of that time in counseling and care-coordination.   [name] Dock, NP   05/28/2016 8:38 AM Medical Oncology and Hematology [facility] [address] [place], [place] [address] Tel. [phone]    Fax. [phone]

## 2016-06-04 ENCOUNTER — Ambulatory Visit: Payer: BLUE CROSS/BLUE SHIELD | Admitting: Oncology

## 2016-06-04 ENCOUNTER — Other Ambulatory Visit: Payer: BLUE CROSS/BLUE SHIELD

## 2016-06-04 ENCOUNTER — Ambulatory Visit: Payer: BLUE CROSS/BLUE SHIELD

## 2016-06-11 ENCOUNTER — Other Ambulatory Visit: Payer: Self-pay | Admitting: Internal Medicine

## 2016-06-11 ENCOUNTER — Ambulatory Visit (HOSPITAL_BASED_OUTPATIENT_CLINIC_OR_DEPARTMENT_OTHER): Payer: BLUE CROSS/BLUE SHIELD

## 2016-06-11 ENCOUNTER — Other Ambulatory Visit (HOSPITAL_BASED_OUTPATIENT_CLINIC_OR_DEPARTMENT_OTHER): Payer: BLUE CROSS/BLUE SHIELD

## 2016-06-11 ENCOUNTER — Ambulatory Visit (HOSPITAL_BASED_OUTPATIENT_CLINIC_OR_DEPARTMENT_OTHER): Payer: BLUE CROSS/BLUE SHIELD | Admitting: Oncology

## 2016-06-11 ENCOUNTER — Other Ambulatory Visit: Payer: BLUE CROSS/BLUE SHIELD

## 2016-06-11 VITALS — BP 147/67 | HR 101 | Temp 98.2°F | Resp 20 | Ht 65.0 in | Wt 147.1 lb

## 2016-06-11 DIAGNOSIS — B20 Human immunodeficiency virus [HIV] disease: Secondary | ICD-10-CM

## 2016-06-11 DIAGNOSIS — Z5111 Encounter for antineoplastic chemotherapy: Secondary | ICD-10-CM | POA: Diagnosis not present

## 2016-06-11 DIAGNOSIS — Z171 Estrogen receptor negative status [ER-]: Secondary | ICD-10-CM

## 2016-06-11 DIAGNOSIS — C50412 Malignant neoplasm of upper-outer quadrant of left female breast: Secondary | ICD-10-CM | POA: Diagnosis not present

## 2016-06-11 DIAGNOSIS — D701 Agranulocytosis secondary to cancer chemotherapy: Secondary | ICD-10-CM

## 2016-06-11 LAB — CBC WITH DIFFERENTIAL/PLATELET
BASO%: 0.7 % (ref 0.0–2.0)
Basophils Absolute: 0 10*3/uL (ref 0.0–0.1)
EOS ABS: 0.1 10*3/uL (ref 0.0–0.5)
EOS%: 0.9 % (ref 0.0–7.0)
HEMATOCRIT: 38 % (ref 34.8–46.6)
HGB: 12.9 g/dL (ref 11.6–15.9)
LYMPH#: 1.3 10*3/uL (ref 0.9–3.3)
LYMPH%: 19.6 % (ref 14.0–49.7)
MCH: 34.2 pg — ABNORMAL HIGH (ref 25.1–34.0)
MCHC: 33.8 g/dL (ref 31.5–36.0)
MCV: 101.2 fL — AB (ref 79.5–101.0)
MONO#: 0.7 10*3/uL (ref 0.1–0.9)
MONO%: 10.6 % (ref 0.0–14.0)
NEUT%: 68.2 % (ref 38.4–76.8)
NEUTROS ABS: 4.5 10*3/uL (ref 1.5–6.5)
PLATELETS: 260 10*3/uL (ref 145–400)
RBC: 3.76 10*6/uL (ref 3.70–5.45)
RDW: 15.1 % — ABNORMAL HIGH (ref 11.2–14.5)
WBC: 6.7 10*3/uL (ref 3.9–10.3)

## 2016-06-11 LAB — COMPREHENSIVE METABOLIC PANEL
ALK PHOS: 108 U/L (ref 40–150)
ALT: 32 U/L (ref 0–55)
ANION GAP: 10 meq/L (ref 3–11)
AST: 30 U/L (ref 5–34)
Albumin: 4.2 g/dL (ref 3.5–5.0)
BILIRUBIN TOTAL: 0.29 mg/dL (ref 0.20–1.20)
BUN: 13.4 mg/dL (ref 7.0–26.0)
CALCIUM: 9.6 mg/dL (ref 8.4–10.4)
CO2: 26 mEq/L (ref 22–29)
CREATININE: 0.8 mg/dL (ref 0.6–1.1)
Chloride: 105 mEq/L (ref 98–109)
EGFR: 80 mL/min/{1.73_m2} — AB (ref >90)
Glucose: 76 mg/dl (ref 70–140)
Potassium: 4 mEq/L (ref 3.5–5.1)
Sodium: 141 mEq/L (ref 136–145)
TOTAL PROTEIN: 7.1 g/dL (ref 6.4–8.3)

## 2016-06-11 MED ORDER — PROCHLORPERAZINE MALEATE 10 MG PO TABS
10.0000 mg | ORAL_TABLET | Freq: Once | ORAL | Status: AC
  Administered 2016-06-11: 10 mg via ORAL

## 2016-06-11 MED ORDER — PACLITAXEL PROTEIN-BOUND CHEMO INJECTION 100 MG
100.0000 mg/m2 | Freq: Once | INTRAVENOUS | Status: AC
  Administered 2016-06-11: 175 mg via INTRAVENOUS
  Filled 2016-06-11: qty 35

## 2016-06-11 MED ORDER — SODIUM CHLORIDE 0.9% FLUSH
10.0000 mL | INTRAVENOUS | Status: DC | PRN
  Administered 2016-06-11: 10 mL
  Filled 2016-06-11: qty 10

## 2016-06-11 MED ORDER — SODIUM CHLORIDE 0.9 % IV SOLN
Freq: Once | INTRAVENOUS | Status: AC
  Administered 2016-06-11: 11:00:00 via INTRAVENOUS

## 2016-06-11 MED ORDER — PROCHLORPERAZINE MALEATE 10 MG PO TABS
ORAL_TABLET | ORAL | Status: AC
  Filled 2016-06-11: qty 1

## 2016-06-11 MED ORDER — HEPARIN SOD (PORK) LOCK FLUSH 100 UNIT/ML IV SOLN
500.0000 [IU] | Freq: Once | INTRAVENOUS | Status: AC | PRN
  Administered 2016-06-11: 500 [IU]
  Filled 2016-06-11: qty 5

## 2016-06-11 NOTE — Progress Notes (Signed)
[place]  [phone] Fax:([phone]) [phone]     ID: [name] DOB: Jul 18, 1950  MR#: [id number]  YSA#:[id number]  Patient Care Team: Carlyle Basques, MD as PCP - General (Infectious Diseases) Carlyle Basques, MD as PCP - Infectious Diseases (Infectious Diseases) Alphonsa Overall, MD as Consulting Physician (General Surgery) Emslee Lopezmartinez, Virgie Dad, MD as Consulting Physician (Oncology) Gery Pray, MD as Consulting Physician (Radiation Oncology) Marcial Pacas, MD as Consulting Physician (Neurology) Druscilla Brownie, MD as Consulting Physician (Dermatology) Delice Bison, Charlestine Massed, NP as Nurse Practitioner (Hematology and Oncology) Chauncey Cruel, MD OTHER MD:  CHIEF COMPLAINT: Triple negative breast cancer  CURRENT TREATMENT: Adjuvant chemotherapy   BREAST CANCER HISTORY:  From the origil intake note:  [name] herself palpated a mass in the upper outer quadrant of her left breast the first week in November, and had bilateral diagnostic mammography with tomography and left breast ultrasonography at the [facility] 12/05/2015. The breast density was category C. In the upper outer left breast there was  A mass with macro lobulated contours which was barely palpable at the 12:30 o'clock position of the left breast 7 cm from the nipple. There was no palpable axillary adenopathy. Ultrasonography confirmed a 2.1 cm obliquely oriented hypoechoic mass in the area in question. Ultrasound of the left axilla was benign.  Biopsy of the left breast mass in question 12/07/2015 showed ([id number]) invasive ductal carcinoma, grade 3, estrogen and progesterone receptor negative, with an MIB-1 of 35%, and no HER-2 amplification, the signals ratio being 0.95 and the number per cell 2.80.  Her subsequent history is as detailed below  INTERVAL HISTORY: [name] returns today for follow-up of her estrogen receptor negative breast cancer. She is currently being treated with  Abraxane, which she currently receives on days 1 and 8 of each 21 day cycle, with OnPro on day 9. She is here for her day 1 cycle 4, which is her eighth dose of treatment  [name] tells me her husband died suddenly last week. "He was in the yard and just dropdead". There have been many people in and out of the house but no one has been infected that she knows of. The family is managing well despite the sudden tragedy  REVIEW OF SYSTEMS: She has some allergies and a minimal cough. She had an episode of epistaxis about a month ago but she has resolved that with Claritin. Now she has some scabs at times when she blows her nose. She has had no fever rash or bleeding. She has a dry mouth which she is treating with Biotene. She has joint pains here and there and she did have bony aches from the Neulasta 2 weeks ago. She treated that with muscle relaxants. Aside from that a detailed review of systems today was stable  PAST MEDICAL HISTORY: Past Medical History:  Diagnosis Date  . HIV (human immunodeficiency virus infection) ([place])   . Hypertension   . Malignant neoplasm of upper-outer quadrant of left female breast ([place]) 12/11/2015  . Migraine   . Neuropathy   . PONV (postoperative nausea and vomiting)    said usually has to have scop patch  . Rash and nonspecific skin eruption 10/16/2015  . Spinal stenosis     PAST SURGICAL HISTORY: Past Surgical History:  Procedure Laterality Date  . ABDOMINAL HYSTERECTOMY  1990  . BREAST LUMPECTOMY WITH RADIOACTIVE SEED AND SENTINEL LYMPH NODE BIOPSY Left 01/01/2016   Procedure: LEFT BREAST LUMPECTOMY WITH RADIOACTIVE SEED AND SENTINEL LYMPH NODE BIOPSY;  Surgeon: Alphonsa Overall, MD;  Location: [facility];  Service: General;  Laterality: Left;  . CARPAL TUNNEL RELEASE     both hands  . CERVICAL FUSION  1989  . PORTACATH PLACEMENT Right 01/01/2016   Procedure: INSERTION PORT-A-CATH WITH [name];  Surgeon: Alphonsa Overall, MD;  Location: [facility];  Service: General;  Laterality: Right;  . TONSILLECTOMY      FAMILY HISTORY Family History  Problem Relation Age of Onset  . High blood pressure Mother   . High Cholesterol Mother   . Cancer Father   . High blood pressure Father   . High Cholesterol Father   . Lung cancer Maternal Grandfather   . Breast cancer Maternal Aunt 52  The patient's father died from heart disease at the age of 44. The patient's mother died from "genetic cirrhosis" at the age of 107. The patient had 2 brothers, 5 sisters. A maternal aunt was diagnosed with breast cancer at age 79. There is no other history of breast or ovarian cancer in the family to the patient's knowledge   GYNECOLOGIC HISTORY:  No LMP recorded. Patient has had a hysterectomy. Menarche age 6, first live birth age 45. Patient is GX P1. She underwent hysterectomy with bilateral salpingo-oophorectomy at age 39. She took hormone replacement for approximately 10 years, until age 75. She also had oral contraceptives remotely for 8-10 years, with no complications.   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 At home is just she and her husband [name] who is retired from Teaching laboratory technician work. Their son [name] works as a Chief Strategy Officer for rest home in [place]. The patient has 3 grandchildren. She is not a Ambulance person.   ADVANCED DIRECTIVES: The patient has a living will in place   HEALTH MAINTENANCE: Social History  Substance Use Topics  . Smoking status: Never Smoker  . Smokeless tobacco: Never Used  . Alcohol use 0.6 oz/week    1 Glasses of wine per week     Comment: on weekends     Colonoscopy: 2012/be routinely  PAP: Status post hysterectomy  Bone density: Never   No Known Allergies  Current Outpatient Prescriptions  Medication Sig Dispense Refill  . amLODipine (NORVASC) 5 MG tablet Take 1 tablet (5 mg total) by mouth daily. 30 tablet 5  . benazepril (LOTENSIN) 40 MG tablet Take 1 tablet (40 mg  total) by mouth daily. 30 tablet 5  . Coenzyme Q10 300 MG CAPS Take 300 capsules by mouth daily.    . cyclobenzaprine (FLEXERIL) 10 MG tablet Take 1 tablet (10 mg total) by mouth 3 (three) times daily as needed for muscle spasms. 90 tablet 3  . DESCOVY 200-25 MG tablet TAKE ONE TABLET BY MOUTH EVERY DAY 30 tablet 5  . flunisolide (NASALIDE) 25 MCG/ACT (0.025%) SOLN Place 2 sprays into the nose 2 (two) times daily. 1 Bottle 3  . gabapentin (NEURONTIN) 100 MG capsule Take 1 capsule (100 mg total) by mouth 2 (two) times daily. 180 capsule 1  . lidocaine-prilocaine (EMLA) cream APPLY TO AFFECTED AREA(S) EVERY DAY  3  . LORazepam (ATIVAN) 0.5 MG tablet Take 0.5 mg by mouth every 6 (six) hours as needed.  0  . Multiple Vitamin (MULTIVITAMIN WITH MINERALS) TABS tablet Take 1 tablet by mouth daily.    Marland Kitchen nystatin (MYCOSTATIN) 100000 UNIT/ML suspension Take 5 mLs (500,000 Units total) by mouth 4 (four) times daily. (Patient not taking: Reported on 05/14/2016) 473  mL 0  . Omega-3 Fatty Acids (FISH OIL) 1200 MG CAPS Take 1,200 mg by mouth 2 (two) times daily.      . prochlorperazine (COMPAZINE) 10 MG tablet Take 1 tablet (10 mg total) by mouth every 6 (six) hours as needed (Nausea or vomiting). 30 tablet 1  . rosuvastatin (CRESTOR) 5 MG tablet Take 1 tablet (5 mg total) by mouth at bedtime. 30 tablet 5  . sulfamethoxazole-trimethoprim (BACTRIM,SEPTRA) 400-80 MG tablet TAKE ONE TABLET BY MOUTH EVERY DAY 30 tablet 2  . TIVICAY 50 MG tablet TAKE ONE TABLET BY MOUTH EVERY DAY 30 tablet 5   No current facility-administered medications for this visit.    Facility-Administered Medications Ordered in Other Visits  Medication Dose Route Frequency Provider Last Rate Last Dose  . sodium chloride flush (NS) 0.9 % injection 10 mL  10 mL Intravenous PRN Lamont Glasscock, Virgie Dad, MD   10 mL at 05/21/16 1022    OBJECTIVE:Middle-aged white woman in no acute distress  Vitals:   06/11/16 0802  BP: (!) 147/67  Pulse: (!) 101    Resp: 20  Temp: 98.2 F (36.8 C)     Body mass index is 24.48 kg/m.    ECOG FS:1 - Symptomatic but completely ambulatory   Sclerae unicteric, EOMs intact Oropharynx clear and moist No cervical or supraclavicular adenopathy Lungs no rales or rhonchi Heart regular rate and rhythm Abd soft, nontender, positive bowel sounds MSK no focal spinal tenderness, no upper extremity lymphedema Neuro: nonfocal, well oriented, appropriate affect Breasts: The right breast is unremarkable. The left breast is status post lumpectomy. No evidence of local recurrence. The cosmetic result is very good. Both axillae are benign.  LAB RESULTS:  CMP     Component Value Date/Time   NA 141 06/11/2016 0750   K 4.0 06/11/2016 0750   CL 106 03/28/2016 1139   CO2 26 06/11/2016 0750   GLUCOSE 76 06/11/2016 0750   BUN 13.4 06/11/2016 0750   CREATININE 0.8 06/11/2016 0750   CALCIUM 9.6 06/11/2016 0750   PROT 7.1 06/11/2016 0750   ALBUMIN 4.2 06/11/2016 0750   AST 30 06/11/2016 0750   ALT 32 06/11/2016 0750   ALKPHOS 108 06/11/2016 0750   BILITOT 0.29 06/11/2016 0750   GFRNONAA 87 03/28/2016 1139   GFRAA >89 03/28/2016 1139    INo results found for: SPEP, UPEP  Lab Results  Component Value Date   WBC 6.7 06/11/2016   NEUTROABS 4.5 06/11/2016   HGB 12.9 06/11/2016   HCT 38.0 06/11/2016   MCV 101.2 (H) 06/11/2016   PLT 260 06/11/2016      Chemistry      Component Value Date/Time   NA 141 06/11/2016 0750   K 4.0 06/11/2016 0750   CL 106 03/28/2016 1139   CO2 26 06/11/2016 0750   BUN 13.4 06/11/2016 0750   CREATININE 0.8 06/11/2016 0750      Component Value Date/Time   CALCIUM 9.6 06/11/2016 0750   ALKPHOS 108 06/11/2016 0750   AST 30 06/11/2016 0750   ALT 32 06/11/2016 0750   BILITOT 0.29 06/11/2016 0750       No results found for: LABCA2  No components found for: LABCA125  No results for input(s): INR in the last 168 hours.  Urinalysis    Component Value Date/Time    COLORURINE YELLOW 03/21/2007 1108   APPEARANCEUR CLEAR 03/21/2007 1108   LABSPEC 1.021 03/21/2007 1108   PHURINE 7.0 03/21/2007 1108   GLUCOSEU 100 (A) 03/21/2007 1108  HGBUR NEGATIVE 03/21/2007 1108   BILIRUBINUR NEGATIVE 03/21/2007 1108   KETONESUR NEGATIVE 03/21/2007 1108   PROTEINUR NEGATIVE 03/21/2007 1108   UROBILINOGEN 1.0 03/21/2007 1108   NITRITE NEGATIVE 03/21/2007 1108   LEUKOCYTESUR SMALL (A) 03/21/2007 1108     STUDIES: No results found.  ELIGIBLE FOR AVAILABLE RESEARCH PROTOCOL: [facility] [phone]  ASSESSMENT: 66 y.o. [name] woman status post left breast upper outer quadrant biopsy 12/07/2015 for a clinical T2 N0, stage IIA invasive ductal carcinoma, grade 3, triple negative, with an MIB-1 of 35%  (1) genetics testing 01/23/2016  (a) alpha - anti trypsin and ceruloplamin labs drawn 01/19/2016, both normal  (2) Status postft lumpectomy/ sentinel lymph node sampling 01/01/2016 for a pT1c pN0, stage IA  Invasive ductal carcinoma, grade 3, with negative margins.  (3) Mammaprint sent from the patient's 12/07/2015 biopsy returned "high risk", predicting a risk of recurrence of nearly 30% untreated, decreasing to a 93% chance of five-year distant metastasis free survival with chemotherapy.  (4) adjuvant chemotherapy consisting of cyclophosphamide and doxorubicin in dose dense fashion 4 started01/04/2016, completed 03/12/2016, followed by weekly Abraxane 12 starting 04/02/2016, changed on 04/30/16 to Abraxane days 1,8,15, with Neulasta support on 28 day cycle, then changed on 4/17 to Abraxane days 1, 8, on a 21 day cycle with Neulasta on day 9 due to neutropenia  (5) adjuvant radiation to follow  PLAN: [name] is tolerating her treatments remarkably well except for the problem of neutropenia. We are dealing with that by treating her on days 1 and 8 and then on day 8 giving her on Pro. I think by doing that she will be able to finish her last 4 doses of Abraxane without further  complications.  It is shocking that her husband dropped dead from a heart attack a week ago. She and the family seem to be bearing up well.  She did have some bony pain from the Neulasta. I discussed using a combination of Abraxane and Tylenol for that as well as a muscle relaxant which is what she used the last time.  For her mouth dryness I gave her the recipes foresee water and how to rinse. I think that will help with that problem.  Otherwise she will see [name] again next week. I hope we can proceed to dose number 9 at that time   Chauncey Cruel, MD   06/11/2016 8:42 AM Medical Oncology and Hematology [facility] [address] [place], [place] [address] Tel. [phone]    Fax. [phone]

## 2016-06-11 NOTE — Patient Instructions (Signed)
[facility] Discharge Instructions for Patients Receiving Chemotherapy  Today you received the following chemotherapy agents Abraxane To help prevent nausea and vomiting after your treatment, we encourage you to take your nausea medication as prescribed.   If you develop nausea and vomiting that is not controlled by your nausea medication, call the clinic.   BELOW ARE SYMPTOMS THAT SHOULD BE REPORTED IMMEDIATELY:  *FEVER GREATER THAN 100.5 F  *CHILLS WITH OR WITHOUT FEVER  NAUSEA AND VOMITING THAT IS NOT CONTROLLED WITH YOUR NAUSEA MEDICATION  *UNUSUAL SHORTNESS OF BREATH  *UNUSUAL BRUISING OR BLEEDING  TENDERNESS IN MOUTH AND THROAT WITH OR WITHOUT PRESENCE OF ULCERS  *URINARY PROBLEMS  *BOWEL PROBLEMS  UNUSUAL RASH Items with * indicate a potential emergency and should be followed up as soon as possible.  Feel free to call the clinic you have any questions or concerns. The clinic phone number is [phone].  Please show the CHEMO ALERT CARD at check-in to the Emergency Department and triage nurse.   

## 2016-06-18 ENCOUNTER — Other Ambulatory Visit (HOSPITAL_BASED_OUTPATIENT_CLINIC_OR_DEPARTMENT_OTHER): Payer: BLUE CROSS/BLUE SHIELD

## 2016-06-18 ENCOUNTER — Ambulatory Visit (HOSPITAL_BASED_OUTPATIENT_CLINIC_OR_DEPARTMENT_OTHER): Payer: BLUE CROSS/BLUE SHIELD | Admitting: Adult Health

## 2016-06-18 ENCOUNTER — Other Ambulatory Visit: Payer: BLUE CROSS/BLUE SHIELD

## 2016-06-18 ENCOUNTER — Encounter: Payer: Self-pay | Admitting: Adult Health

## 2016-06-18 ENCOUNTER — Ambulatory Visit (HOSPITAL_BASED_OUTPATIENT_CLINIC_OR_DEPARTMENT_OTHER): Payer: BLUE CROSS/BLUE SHIELD

## 2016-06-18 VITALS — BP 124/77 | HR 87 | Temp 98.1°F | Resp 18 | Ht 65.0 in | Wt 145.9 lb

## 2016-06-18 DIAGNOSIS — Z171 Estrogen receptor negative status [ER-]: Principal | ICD-10-CM

## 2016-06-18 DIAGNOSIS — Z5189 Encounter for other specified aftercare: Secondary | ICD-10-CM

## 2016-06-18 DIAGNOSIS — C50412 Malignant neoplasm of upper-outer quadrant of left female breast: Secondary | ICD-10-CM

## 2016-06-18 DIAGNOSIS — Z5111 Encounter for antineoplastic chemotherapy: Secondary | ICD-10-CM | POA: Diagnosis not present

## 2016-06-18 DIAGNOSIS — B20 Human immunodeficiency virus [HIV] disease: Secondary | ICD-10-CM

## 2016-06-18 LAB — COMPREHENSIVE METABOLIC PANEL
ALBUMIN: 4.4 g/dL (ref 3.5–5.0)
ALK PHOS: 80 U/L (ref 40–150)
ALT: 44 U/L (ref 0–55)
AST: 37 U/L — AB (ref 5–34)
Anion Gap: 8 mEq/L (ref 3–11)
BUN: 10 mg/dL (ref 7.0–26.0)
CO2: 27 meq/L (ref 22–29)
Calcium: 9.6 mg/dL (ref 8.4–10.4)
Chloride: 106 mEq/L (ref 98–109)
Creatinine: 0.8 mg/dL (ref 0.6–1.1)
EGFR: 80 mL/min/{1.73_m2} — ABNORMAL LOW (ref >90)
GLUCOSE: 91 mg/dL (ref 70–140)
POTASSIUM: 4.2 meq/L (ref 3.5–5.1)
SODIUM: 142 meq/L (ref 136–145)
TOTAL PROTEIN: 6.9 g/dL (ref 6.4–8.3)
Total Bilirubin: 0.36 mg/dL (ref 0.20–1.20)

## 2016-06-18 LAB — CBC WITH DIFFERENTIAL/PLATELET
BASO%: 2.3 % — ABNORMAL HIGH (ref 0.0–2.0)
Basophils Absolute: 0.1 10*3/uL (ref 0.0–0.1)
EOS%: 0.8 % (ref 0.0–7.0)
Eosinophils Absolute: 0 10*3/uL (ref 0.0–0.5)
HCT: 35.7 % (ref 34.8–46.6)
HEMOGLOBIN: 12.1 g/dL (ref 11.6–15.9)
LYMPH%: 35.6 % (ref 14.0–49.7)
MCH: 34.1 pg — AB (ref 25.1–34.0)
MCHC: 34.1 g/dL (ref 31.5–36.0)
MCV: 100.1 fL (ref 79.5–101.0)
MONO#: 0.3 10*3/uL (ref 0.1–0.9)
MONO%: 8 % (ref 0.0–14.0)
NEUT%: 53.3 % (ref 38.4–76.8)
NEUTROS ABS: 1.7 10*3/uL (ref 1.5–6.5)
Platelets: 304 10*3/uL (ref 145–400)
RBC: 3.56 10*6/uL — AB (ref 3.70–5.45)
RDW: 14.8 % — AB (ref 11.2–14.5)
WBC: 3.2 10*3/uL — AB (ref 3.9–10.3)
lymph#: 1.2 10*3/uL (ref 0.9–3.3)

## 2016-06-18 MED ORDER — PROCHLORPERAZINE MALEATE 10 MG PO TABS
10.0000 mg | ORAL_TABLET | Freq: Once | ORAL | Status: AC
  Administered 2016-06-18: 10 mg via ORAL

## 2016-06-18 MED ORDER — PROCHLORPERAZINE MALEATE 10 MG PO TABS
ORAL_TABLET | ORAL | Status: AC
  Filled 2016-06-18: qty 1

## 2016-06-18 MED ORDER — SODIUM CHLORIDE 0.9% FLUSH
10.0000 mL | INTRAVENOUS | Status: DC | PRN
  Administered 2016-06-18: 10 mL
  Filled 2016-06-18: qty 10

## 2016-06-18 MED ORDER — SODIUM CHLORIDE 0.9 % IV SOLN
Freq: Once | INTRAVENOUS | Status: AC
  Administered 2016-06-18: 12:00:00 via INTRAVENOUS

## 2016-06-18 MED ORDER — HEPARIN SOD (PORK) LOCK FLUSH 100 UNIT/ML IV SOLN
500.0000 [IU] | Freq: Once | INTRAVENOUS | Status: AC | PRN
Start: 2016-06-18 — End: 2016-06-18
  Administered 2016-06-18: 500 [IU]
  Filled 2016-06-18: qty 5

## 2016-06-18 MED ORDER — PEGFILGRASTIM 6 MG/0.6ML ~~LOC~~ PSKT
6.0000 mg | PREFILLED_SYRINGE | Freq: Once | SUBCUTANEOUS | Status: AC
  Administered 2016-06-18: 6 mg via SUBCUTANEOUS
  Filled 2016-06-18: qty 0.6

## 2016-06-18 MED ORDER — PACLITAXEL PROTEIN-BOUND CHEMO INJECTION 100 MG
100.0000 mg/m2 | Freq: Once | INTRAVENOUS | Status: AC
  Administered 2016-06-18: 175 mg via INTRAVENOUS
  Filled 2016-06-18: qty 35

## 2016-06-18 NOTE — Progress Notes (Signed)
[place]  [phone] [phone]) [phone]     ID: [name] DOB: Jun 11, 1950  MR#: [id number]  JYN#:[id number]  Patient Care Team: Carlyle Basques, MD as PCP - General (Infectious Diseases) Carlyle Basques, MD as PCP - Infectious Diseases (Infectious Diseases) Alphonsa Overall, MD as Consulting Physician (General Surgery) Magrinat, Virgie Dad, MD as Consulting Physician (Oncology) Gery Pray, MD as Consulting Physician (Radiation Oncology) Marcial Pacas, MD as Consulting Physician (Neurology) Druscilla Brownie, MD as Consulting Physician (Dermatology) Delice Bison, Charlestine Massed, NP as Nurse Practitioner (Hematology and Oncology) Scot Dock, NP OTHER MD:  CHIEF COMPLAINT: Triple negative breast cancer  CURRENT TREATMENT: Adjuvant chemotherapy   BREAST CANCER HISTORY:  From the origil intake note:  Liliana herself palpated a mass in the upper outer quadrant of her left breast the first week in November, and had bilateral diagnostic mammography with tomography and left breast ultrasonography at the [facility] 12/05/2015. The breast density was category C. In the upper outer left breast there was  A mass with macro lobulated contours which was barely palpable at the 12:30 o'clock position of the left breast 7 cm from the nipple. There was no palpable axillary adenopathy. Ultrasonography confirmed a 2.1 cm obliquely oriented hypoechoic mass in the area in question. Ultrasound of the left axilla was benign.  Biopsy of the left breast mass in question 12/07/2015 showed ([id number]) invasive ductal carcinoma, grade 3, estrogen and progesterone receptor negative, with an MIB-1 of 35%, and no HER-2 amplification, the signals ratio being 0.95 and the number per cell 2.80.  Her subsequent history is as detailed below  INTERVAL HISTORY: [name] is here today prior to receiving Abraxane chemotherapy.  Today is day 8 of her treatment and she will receive the  Onpro today.  Her labs are normal.  She is without any questions or concerns.    REVIEW OF SYSTEMS: Van denies fevers, chills, chest pain, palpitations, mucositis, abdominal discomfort or bowel changes, peripheral neuropathy or any other concerns.  Detailed ROS was non contributory.    PAST MEDICAL HISTORY: Past Medical History:  Diagnosis Date  . HIV (human immunodeficiency virus infection) ([place])   . Hypertension   . Malignant neoplasm of upper-outer quadrant of left female breast ([place]) 12/11/2015  . Migraine   . Neuropathy   . PONV (postoperative nausea and vomiting)    said usually has to have scop patch  . Rash and nonspecific skin eruption 10/16/2015  . Spinal stenosis     PAST SURGICAL HISTORY: Past Surgical History:  Procedure Laterality Date  . ABDOMINAL HYSTERECTOMY  1990  . BREAST LUMPECTOMY WITH RADIOACTIVE SEED AND SENTINEL LYMPH NODE BIOPSY Left 01/01/2016   Procedure: LEFT BREAST LUMPECTOMY WITH RADIOACTIVE SEED AND SENTINEL LYMPH NODE BIOPSY;  Surgeon: Alphonsa Overall, MD;  Location: [facility];  Service: General;  Laterality: Left;  . CARPAL TUNNEL RELEASE     both hands  . CERVICAL FUSION  1989  . PORTACATH PLACEMENT Right 01/01/2016   Procedure: INSERTION PORT-A-CATH WITH [name];  Surgeon: Alphonsa Overall, MD;  Location: [facility];  Service: General;  Laterality: Right;  . TONSILLECTOMY      FAMILY HISTORY Family History  Problem Relation Age of Onset  . High blood pressure Mother   . High Cholesterol Mother   . Cancer Father   . High blood pressure Father   . High Cholesterol Father   . Lung cancer Maternal Grandfather   . Breast cancer Maternal Aunt 38  The patient's father died from heart disease at the age of 63. The patient's mother died from "genetic cirrhosis" at the age of 62. The patient had 2 brothers, 5 sisters. A maternal aunt was diagnosed with breast cancer at age 14. There is no other history of breast or ovarian cancer  in the family to the patient's knowledge   GYNECOLOGIC HISTORY:  No LMP recorded. Patient has had a hysterectomy. Menarche age 67, first live birth age 55. Patient is GX P1. She underwent hysterectomy with bilateral salpingo-oophorectomy at age 75. She took hormone replacement for approximately 10 years, until age 17. She also had oral contraceptives remotely for 8-10 years, with no complications.   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 At home is just she and her husband [name] who is retired from Teaching laboratory technician work. Their son [name] works as a Chief Strategy Officer for rest home in [place]. The patient has 3 grandchildren. She is not a Ambulance person.   ADVANCED DIRECTIVES: The patient has a living will in place   HEALTH MAINTENANCE: Social History  Substance Use Topics  . Smoking status: Never Smoker  . Smokeless tobacco: Never Used  . Alcohol use 0.6 oz/week    1 Glasses of wine per week     Comment: on weekends     Colonoscopy: 2012/be routinely  PAP: Status post hysterectomy  Bone density: Never   No Known Allergies  Current Outpatient Prescriptions  Medication Sig Dispense Refill  . amLODipine (NORVASC) 5 MG tablet Take 1 tablet (5 mg total) by mouth daily. 30 tablet 5  . benazepril (LOTENSIN) 40 MG tablet Take 1 tablet (40 mg total) by mouth daily. 30 tablet 5  . Coenzyme Q10 300 MG CAPS Take 300 capsules by mouth daily.    . cyclobenzaprine (FLEXERIL) 10 MG tablet Take 1 tablet (10 mg total) by mouth 3 (three) times daily as needed for muscle spasms. 90 tablet 3  . DESCOVY 200-25 MG tablet TAKE ONE TABLET BY MOUTH EVERY DAY 30 tablet 5  . flunisolide (NASALIDE) 25 MCG/ACT (0.025%) SOLN Place 2 sprays into the nose 2 (two) times daily. 1 Bottle 3  . gabapentin (NEURONTIN) 100 MG capsule Take 1 capsule (100 mg total) by mouth 2 (two) times daily. 180 capsule 1  . lidocaine-prilocaine (EMLA) cream APPLY TO AFFECTED AREA(S) EVERY DAY  3  .  LORazepam (ATIVAN) 0.5 MG tablet Take 0.5 mg by mouth every 6 (six) hours as needed.  0  . Multiple Vitamin (MULTIVITAMIN WITH MINERALS) TABS tablet Take 1 tablet by mouth daily.    . Omega-3 Fatty Acids (FISH OIL) 1200 MG CAPS Take 1,200 mg by mouth 2 (two) times daily.      . prochlorperazine (COMPAZINE) 10 MG tablet Take 1 tablet (10 mg total) by mouth every 6 (six) hours as needed (Nausea or vomiting). 30 tablet 1  . rosuvastatin (CRESTOR) 5 MG tablet Take 1 tablet (5 mg total) by mouth at bedtime. 30 tablet 5  . sulfamethoxazole-trimethoprim (BACTRIM,SEPTRA) 400-80 MG tablet TAKE ONE TABLET BY MOUTH EVERY DAY 30 tablet 5  . TIVICAY 50 MG tablet TAKE ONE TABLET BY MOUTH EVERY DAY 30 tablet 5   No current facility-administered medications for this visit.    Facility-Administered Medications Ordered in Other Visits  Medication Dose Route Frequency Provider Last Rate Last Dose  . heparin lock flush 100 unit/mL  500 Units Intracatheter Once PRN Magrinat, Virgie Dad, MD      .  PACLitaxel-protein bound (ABRAXANE) chemo infusion 175 mg  100 mg/m2 (Treatment Plan Recorded) Intravenous Once Magrinat, Virgie Dad, MD 70 mL/hr at 06/18/16 1250 175 mg at 06/18/16 1250  . pegfilgrastim (NEULASTA ONPRO KIT) injection 6 mg  6 mg Subcutaneous Once Sylar Voong, Charlestine Massed, NP      . sodium chloride flush (NS) 0.9 % injection 10 mL  10 mL Intravenous PRN Magrinat, Virgie Dad, MD   10 mL at 05/21/16 1022  . sodium chloride flush (NS) 0.9 % injection 10 mL  10 mL Intracatheter PRN Magrinat, Virgie Dad, MD        OBJECTIVE:  Vitals:   06/18/16 1114  BP: 124/77  Pulse: 87  Resp: 18  Temp: 98.1 F (36.7 C)     Body mass index is 24.28 kg/m.    ECOG FS:1 - Symptomatic but completely ambulatory  GENERAL: Patient is a well appearing female in no acute distress HEENT:  Sclerae anicteric.  Oropharynx clear and moist. No ulcerations or evidence of oropharyngeal candidiasis. Neck is supple.  NODES:  No cervical,  supraclavicular, or axillary lymphadenopathy palpated.  BREAST EXAM:  Deferred. LUNGS:  Clear to auscultation bilaterally.  No wheezes or rhonchi. HEART:  Regular rate and rhythm. No murmur appreciated. ABDOMEN:  Soft, nontender.  Positive, normoactive bowel sounds. No organomegaly palpated. MSK:  No focal spinal tenderness to palpation. Full range of motion bilaterally in the upper extremities. EXTREMITIES:  No peripheral edema.   SKIN:  Clear with no obvious rashes or skin changes. No nail dyscrasia. NEURO:  Nonfocal. Well oriented.  Appropriate affect.    LAB RESULTS:  CMP     Component Value Date/Time   NA 142 06/18/2016 1052   K 4.2 06/18/2016 1052   CL 106 03/28/2016 1139   CO2 27 06/18/2016 1052   GLUCOSE 91 06/18/2016 1052   BUN 10.0 06/18/2016 1052   CREATININE 0.8 06/18/2016 1052   CALCIUM 9.6 06/18/2016 1052   PROT 6.9 06/18/2016 1052   ALBUMIN 4.4 06/18/2016 1052   AST 37 (H) 06/18/2016 1052   ALT 44 06/18/2016 1052   ALKPHOS 80 06/18/2016 1052   BILITOT 0.36 06/18/2016 1052   GFRNONAA 87 03/28/2016 1139   GFRAA >89 03/28/2016 1139    INo results found for: SPEP, UPEP  Lab Results  Component Value Date   WBC 3.2 (L) 06/18/2016   NEUTROABS 1.7 06/18/2016   HGB 12.1 06/18/2016   HCT 35.7 06/18/2016   MCV 100.1 06/18/2016   PLT 304 06/18/2016      Chemistry      Component Value Date/Time   NA 142 06/18/2016 1052   K 4.2 06/18/2016 1052   CL 106 03/28/2016 1139   CO2 27 06/18/2016 1052   BUN 10.0 06/18/2016 1052   CREATININE 0.8 06/18/2016 1052      Component Value Date/Time   CALCIUM 9.6 06/18/2016 1052   ALKPHOS 80 06/18/2016 1052   AST 37 (H) 06/18/2016 1052   ALT 44 06/18/2016 1052   BILITOT 0.36 06/18/2016 1052       No results found for: LABCA2  No components found for: LABCA125  No results for input(s): INR in the last 168 hours.  Urinalysis    Component Value Date/Time   COLORURINE YELLOW 03/21/2007 1108   APPEARANCEUR CLEAR  03/21/2007 1108   LABSPEC 1.021 03/21/2007 1108   PHURINE 7.0 03/21/2007 1108   GLUCOSEU 100 (A) 03/21/2007 1108   HGBUR NEGATIVE 03/21/2007 1108   [place] 03/21/2007 1108  KETONESUR NEGATIVE 03/21/2007 1108   PROTEINUR NEGATIVE 03/21/2007 1108   UROBILINOGEN 1.0 03/21/2007 1108   NITRITE NEGATIVE 03/21/2007 1108   LEUKOCYTESUR SMALL (A) 03/21/2007 1108     STUDIES: No results found.  ELIGIBLE FOR AVAILABLE RESEARCH PROTOCOL: [facility] [phone]  ASSESSMENT: 66 y.o. [place] woman status post left breast upper outer quadrant biopsy 12/07/2015 for a clinical T2 N0, stage IIA invasive ductal carcinoma, grade 3, triple negative, with an MIB-1 of 35%  (1) genetics testing 01/23/2016  (a) alpha - anti trypsin and ceruloplamin labs drawn 01/19/2016, both normal  (2) Status postft lumpectomy/ sentinel lymph node sampling 01/01/2016 for a pT1c pN0, stage IA  Invasive ductal carcinoma, grade 3, with negative margins.  (3) Mammaprint sent from the patient's 12/07/2015 biopsy returned "high risk", predicting a risk of recurrence of nearly 30% untreated, decreasing to a 93% chance of five-year distant metastasis free survival with chemotherapy.  (4) adjuvant chemotherapy consisting of cyclophosphamide and doxorubicin in dose dense fashion 4 started01/04/2016, completed 03/12/2016, followed by weekly Abraxane 12 starting 04/02/2016, changed on 04/30/16 to Abraxane days 1,8,15, with Neulasta support on 28 day cycle, then changed on 4/17 to Abraxane days 1, 8, on a 21 day cycle with Neulasta on day 9 due to neutropenia  (5) adjuvant radiation to follow  PLAN: Keziah is doing well today.  Her CBC is normal and I reviewed that with her, CMP is pending.  She will proceed with chemotherapy today.  She continues to tolerate it very well.  She will return in 2 weeks for labs, an appt with Dr. Jana Hakim, and her next cycle of Abraxane.    A total of (20) minutes of face-to-face time was spent  with this patient with greater than 50% of that time in counseling and care-coordination.    Scot Dock, NP   06/18/2016 12:59 PM Medical Oncology and Hematology [facility] [address] [place],  [address] Tel. [phone]    Fax. [phone]

## 2016-06-18 NOTE — Patient Instructions (Signed)
[facility] Discharge Instructions for Patients Receiving Chemotherapy  Today you received the following chemotherapy agents Abraxane To help prevent nausea and vomiting after your treatment, we encourage you to take your nausea medication as prescribed.   If you develop nausea and vomiting that is not controlled by your nausea medication, call the clinic.   BELOW ARE SYMPTOMS THAT SHOULD BE REPORTED IMMEDIATELY:  *FEVER GREATER THAN 100.5 F  *CHILLS WITH OR WITHOUT FEVER  NAUSEA AND VOMITING THAT IS NOT CONTROLLED WITH YOUR NAUSEA MEDICATION  *UNUSUAL SHORTNESS OF BREATH  *UNUSUAL BRUISING OR BLEEDING  TENDERNESS IN MOUTH AND THROAT WITH OR WITHOUT PRESENCE OF ULCERS  *URINARY PROBLEMS  *BOWEL PROBLEMS  UNUSUAL RASH Items with * indicate a potential emergency and should be followed up as soon as possible.  Feel free to call the clinic you have any questions or concerns. The clinic phone number is [phone].  Please show the CHEMO ALERT CARD at check-in to the Emergency Department and triage nurse.   

## 2016-06-27 ENCOUNTER — Telehealth: Payer: Self-pay

## 2016-06-27 ENCOUNTER — Ambulatory Visit (HOSPITAL_BASED_OUTPATIENT_CLINIC_OR_DEPARTMENT_OTHER): Payer: BLUE CROSS/BLUE SHIELD | Admitting: Adult Health

## 2016-06-27 ENCOUNTER — Other Ambulatory Visit: Payer: Self-pay | Admitting: *Deleted

## 2016-06-27 ENCOUNTER — Ambulatory Visit (HOSPITAL_COMMUNITY)
Admission: RE | Admit: 2016-06-27 | Discharge: 2016-06-27 | Disposition: A | Discharge status: Home / Self Care | Payer: BLUE CROSS/BLUE SHIELD | Source: Ambulatory Visit | Attending: Adult Health | Admitting: Adult Health

## 2016-06-27 ENCOUNTER — Ambulatory Visit (HOSPITAL_BASED_OUTPATIENT_CLINIC_OR_DEPARTMENT_OTHER): Payer: BLUE CROSS/BLUE SHIELD

## 2016-06-27 ENCOUNTER — Other Ambulatory Visit: Payer: Self-pay

## 2016-06-27 ENCOUNTER — Telehealth: Payer: Self-pay | Admitting: *Deleted

## 2016-06-27 ENCOUNTER — Encounter: Payer: Self-pay | Admitting: Adult Health

## 2016-06-27 ENCOUNTER — Telehealth: Payer: Self-pay | Admitting: Oncology

## 2016-06-27 VITALS — BP 144/73 | HR 102 | Temp 98.1°F | Resp 18 | Ht 65.0 in | Wt 145.0 lb

## 2016-06-27 DIAGNOSIS — R0602 Shortness of breath: Secondary | ICD-10-CM | POA: Insufficient documentation

## 2016-06-27 DIAGNOSIS — C50412 Malignant neoplasm of upper-outer quadrant of left female breast: Secondary | ICD-10-CM

## 2016-06-27 DIAGNOSIS — Z171 Estrogen receptor negative status [ER-]: Principal | ICD-10-CM

## 2016-06-27 LAB — COMPREHENSIVE METABOLIC PANEL
ALBUMIN: 4.6 g/dL (ref 3.5–5.0)
ALK PHOS: 177 U/L — AB (ref 40–150)
ALT: 27 U/L (ref 0–55)
AST: 30 U/L (ref 5–34)
Anion Gap: 10 mEq/L (ref 3–11)
BILIRUBIN TOTAL: 0.34 mg/dL (ref 0.20–1.20)
BUN: 12.4 mg/dL (ref 7.0–26.0)
CALCIUM: 10.2 mg/dL (ref 8.4–10.4)
CO2: 27 mEq/L (ref 22–29)
CREATININE: 0.9 mg/dL (ref 0.6–1.1)
Chloride: 104 mEq/L (ref 98–109)
EGFR: 71 mL/min/{1.73_m2} — ABNORMAL LOW (ref >90)
Glucose: 104 mg/dl (ref 70–140)
Potassium: 4.1 mEq/L (ref 3.5–5.1)
Sodium: 141 mEq/L (ref 136–145)
TOTAL PROTEIN: 7.5 g/dL (ref 6.4–8.3)

## 2016-06-27 LAB — CBC WITH DIFFERENTIAL/PLATELET
BASO%: 0.3 % (ref 0.0–2.0)
Basophils Absolute: 0.1 10*3/uL (ref 0.0–0.1)
EOS%: 0.8 % (ref 0.0–7.0)
Eosinophils Absolute: 0.2 10*3/uL (ref 0.0–0.5)
HEMATOCRIT: 40.6 % (ref 34.8–46.6)
HGB: 13.5 g/dL (ref 11.6–15.9)
LYMPH#: 2.3 10*3/uL (ref 0.9–3.3)
LYMPH%: 9.8 % — ABNORMAL LOW (ref 14.0–49.7)
MCH: 33.4 pg (ref 25.1–34.0)
MCHC: 33.3 g/dL (ref 31.5–36.0)
MCV: 100.5 fL (ref 79.5–101.0)
MONO#: 2 10*3/uL — AB (ref 0.1–0.9)
MONO%: 8.9 % (ref 0.0–14.0)
NEUT%: 80.2 % — ABNORMAL HIGH (ref 38.4–76.8)
NEUTROS ABS: 18.4 10*3/uL — AB (ref 1.5–6.5)
PLATELETS: 276 10*3/uL (ref 145–400)
RBC: 4.04 10*6/uL (ref 3.70–5.45)
RDW: 15.5 % — ABNORMAL HIGH (ref 11.2–14.5)
WBC: 23 10*3/uL — ABNORMAL HIGH (ref 3.9–10.3)

## 2016-06-27 NOTE — Telephone Encounter (Signed)
Pt called c/o tingling in hands and toes, chills without a fever, joiint pain and body aches. All starting Tuesday.  5/22 received abraxane and onpro neulasta [id number].

## 2016-06-27 NOTE — Telephone Encounter (Signed)
lvm to inform pt of 5/31 appt at noon per sch msg

## 2016-06-27 NOTE — Telephone Encounter (Signed)
Patient with complaints of "chills, short of breath, and pressure in chest." Per Mendel Ryder, NP patient needs to come in at 1230 pm, needs lab work including blood cultures, and chest x-ray prior to office visit. Notified patient of chest x-ray at 1130 am, lab work at 12 noon, and then see Mendel Ryder, NP at 1230 pm. Patient verbalized understanding.

## 2016-06-27 NOTE — Progress Notes (Signed)
[place]  Telephone:([phone]) [phone] Fax:([phone]) [phone]     ID: [name] DOB: 1951-01-06  MR#: [id number]  YTW#:[id number]  Patient Care Team: Carlyle Basques, MD as PCP - General (Infectious Diseases) Carlyle Basques, MD as PCP - Infectious Diseases (Infectious Diseases) Alphonsa Overall, MD as Consulting Physician (General Surgery) Magrinat, Virgie Dad, MD as Consulting Physician (Oncology) Gery Pray, MD as Consulting Physician (Radiation Oncology) Marcial Pacas, MD as Consulting Physician (Neurology) Druscilla Brownie, MD as Consulting Physician (Dermatology) Delice Bison, Charlestine Massed, NP as Nurse Practitioner (Hematology and Oncology) Scot Dock, NP OTHER MD:  CHIEF COMPLAINT: Triple negative breast cancer  CURRENT TREATMENT: Adjuvant chemotherapy   BREAST CANCER HISTORY:  From the origil intake note:  [place] herself palpated a mass in the upper outer quadrant of her left breast the first week in November, and had bilateral diagnostic mammography with tomography and left breast ultrasonography at the [facility] 12/05/2015. The breast density was category C. In the upper outer left breast there was  A mass with macro lobulated contours which was barely palpable at the 12:30 o'clock position of the left breast 7 cm from the nipple. There was no palpable axillary adenopathy. Ultrasonography confirmed a 2.1 cm obliquely oriented hypoechoic mass in the area in question. Ultrasound of the left axilla was benign.  Biopsy of the left breast mass in question 12/07/2015 showed ([id number]) invasive ductal carcinoma, grade 3, estrogen and progesterone receptor negative, with an MIB-1 of 35%, and no HER-2 amplification, the signals ratio being 0.95 and the number per cell 2.80.  Her subsequent history is as detailed below  INTERVAL HISTORY: [place] is here today for an urgent evaluation due to chills and numbness.  She began feeling fatigued on Monday.  Tuesday  developed numbness in her hands and feet.  Numbness is in the entire right hand, and in left hand it is just in the fingers.  No change in ability to button, zip.  She says it is harder to grasp remote control, telephone.  No fevers.  She also developed shortness of breath yesterday. This occurs with activity.  She does have some chest pressure that she says is present all the time.  She denies nausea, arm pain, jaw pain, diaphoresis, palpitations, swelling in the legs, orthopnea, pleuritic chest pain.    REVIEW OF SYSTEMS: A 10 point ROS is otherwise non contributory.    PAST MEDICAL HISTORY: Past Medical History:  Diagnosis Date  . HIV (human immunodeficiency virus infection) ([place])   . Hypertension   . Malignant neoplasm of upper-outer quadrant of left female breast ([place]) 12/11/2015  . Migraine   . Neuropathy   . PONV (postoperative nausea and vomiting)    said usually has to have scop patch  . Rash and nonspecific skin eruption 10/16/2015  . Spinal stenosis     PAST SURGICAL HISTORY: Past Surgical History:  Procedure Laterality Date  . ABDOMINAL HYSTERECTOMY  1990  . BREAST LUMPECTOMY WITH RADIOACTIVE SEED AND SENTINEL LYMPH NODE BIOPSY Left 01/01/2016   Procedure: LEFT BREAST LUMPECTOMY WITH RADIOACTIVE SEED AND SENTINEL LYMPH NODE BIOPSY;  Surgeon: Alphonsa Overall, MD;  Location: [facility];  Service: General;  Laterality: Left;  . CARPAL TUNNEL RELEASE     both hands  . CERVICAL FUSION  1989  . PORTACATH PLACEMENT Right 01/01/2016   Procedure: INSERTION PORT-A-CATH WITH [name];  Surgeon: Alphonsa Overall, MD;  Location: [facility];  Service: General;  Laterality: Right;  . TONSILLECTOMY  FAMILY HISTORY Family History  Problem Relation Age of Onset  . High blood pressure Mother   . High Cholesterol Mother   . Cancer Father   . High blood pressure Father   . High Cholesterol Father   . Lung cancer Maternal Grandfather   . Breast cancer Maternal Aunt  79  The patient's father died from heart disease at the age of 97. The patient's mother died from "genetic cirrhosis" at the age of 37. The patient had 2 brothers, 5 sisters. A maternal aunt was diagnosed with breast cancer at age 4. There is no other history of breast or ovarian cancer in the family to the patient's knowledge   GYNECOLOGIC HISTORY:  No LMP recorded. Patient has had a hysterectomy. Menarche age 64, first live birth age 70. Patient is GX P1. She underwent hysterectomy with bilateral salpingo-oophorectomy at age 74. She took hormone replacement for approximately 10 years, until age 76. She also had oral contraceptives remotely for 8-10 years, with no complications.   SOCIAL HISTORY:  [place] worked as a Theme park manager, but now [employer] for living. At home is just she and her husband [name] who is retired from Teaching laboratory technician work. Their son [name] works as a Chief Strategy Officer for rest home in [place]. The patient has 3 grandchildren. She is not a Ambulance person.   ADVANCED DIRECTIVES: The patient has a living will in place   HEALTH MAINTENANCE: Social History  Substance Use Topics  . Smoking status: Never Smoker  . Smokeless tobacco: Never Used  . Alcohol use 0.6 oz/week    1 Glasses of wine per week     Comment: on weekends     Colonoscopy: 2012/be routinely  PAP: Status post hysterectomy  Bone density: Never   No Known Allergies  Current Outpatient Prescriptions  Medication Sig Dispense Refill  . amLODipine (NORVASC) 5 MG tablet Take 1 tablet (5 mg total) by mouth daily. 30 tablet 5  . benazepril (LOTENSIN) 40 MG tablet Take 1 tablet (40 mg total) by mouth daily. 30 tablet 5  . Coenzyme Q10 300 MG CAPS Take 300 capsules by mouth daily.    . cyclobenzaprine (FLEXERIL) 10 MG tablet Take 1 tablet (10 mg total) by mouth 3 (three) times daily as needed for muscle spasms. 90 tablet 3  . DESCOVY 200-25 MG tablet TAKE ONE TABLET BY MOUTH EVERY DAY 30 tablet 5  . flunisolide (NASALIDE)  25 MCG/ACT (0.025%) SOLN Place 2 sprays into the nose 2 (two) times daily. 1 Bottle 3  . gabapentin (NEURONTIN) 100 MG capsule Take 1 capsule (100 mg total) by mouth 2 (two) times daily. 180 capsule 1  . lidocaine-prilocaine (EMLA) cream APPLY TO AFFECTED AREA(S) EVERY DAY  3  . LORazepam (ATIVAN) 0.5 MG tablet Take 0.5 mg by mouth every 6 (six) hours as needed.  0  . Multiple Vitamin (MULTIVITAMIN WITH MINERALS) TABS tablet Take 1 tablet by mouth daily.    . Omega-3 Fatty Acids (FISH OIL) 1200 MG CAPS Take 1,200 mg by mouth 2 (two) times daily.      . prochlorperazine (COMPAZINE) 10 MG tablet Take 1 tablet (10 mg total) by mouth every 6 (six) hours as needed (Nausea or vomiting). 30 tablet 1  . rosuvastatin (CRESTOR) 5 MG tablet Take 1 tablet (5 mg total) by mouth at bedtime. 30 tablet 5  . sulfamethoxazole-trimethoprim (BACTRIM,SEPTRA) 400-80 MG tablet TAKE ONE TABLET BY MOUTH EVERY DAY 30 tablet 5  . TIVICAY 50 MG tablet TAKE ONE TABLET BY  MOUTH EVERY DAY 30 tablet 5   No current facility-administered medications for this visit.    Facility-Administered Medications Ordered in Other Visits  Medication Dose Route Frequency Provider Last Rate Last Dose  . sodium chloride flush (NS) 0.9 % injection 10 mL  10 mL Intravenous PRN Magrinat, Virgie Dad, MD   10 mL at 05/21/16 1022    OBJECTIVE:  Vitals:   06/27/16 1217  BP: (!) 144/73  Pulse: (!) 102  Resp: 18  Temp: 98.1 F (36.7 C)     Body mass index is 24.13 kg/m.    ECOG FS:1 - Symptomatic but completely ambulatory  GENERAL: Patient is a well appearing female in no acute distress HEENT:  Sclerae anicteric.  Oropharynx clear and moist. No ulcerations or evidence of oropharyngeal candidiasis. Neck is supple.  NODES:  No cervical, supraclavicular, or axillary lymphadenopathy palpated.  BREAST EXAM:  Deferred. LUNGS:  Clear to auscultation bilaterally.  No wheezes or rhonchi. HEART:  Regular rate and rhythm. No murmur  appreciated. ABDOMEN:  Soft, nontender.  Positive, normoactive bowel sounds. No organomegaly palpated. MSK:  No focal spinal tenderness to palpation. Full range of motion bilaterally in the upper extremities. EXTREMITIES:  No peripheral edema.   SKIN:  Clear with no obvious rashes or skin changes. No nail dyscrasia. NEURO:  Nonfocal. Well oriented.  Appropriate affect.    LAB RESULTS:  CMP     Component Value Date/Time   NA 141 06/27/2016 1155   K 4.1 06/27/2016 1155   CL 106 03/28/2016 1139   CO2 27 06/27/2016 1155   GLUCOSE 104 06/27/2016 1155   BUN 12.4 06/27/2016 1155   CREATININE 0.9 06/27/2016 1155   CALCIUM 10.2 06/27/2016 1155   PROT 7.5 06/27/2016 1155   ALBUMIN 4.6 06/27/2016 1155   AST 30 06/27/2016 1155   ALT 27 06/27/2016 1155   ALKPHOS 177 (H) 06/27/2016 1155   BILITOT 0.34 06/27/2016 1155   GFRNONAA 87 03/28/2016 1139   GFRAA >89 03/28/2016 1139    INo results found for: SPEP, UPEP  Lab Results  Component Value Date   WBC 23.0 (H) 06/27/2016   NEUTROABS 18.4 (H) 06/27/2016   HGB 13.5 06/27/2016   HCT 40.6 06/27/2016   MCV 100.5 06/27/2016   PLT 276 06/27/2016      Chemistry      Component Value Date/Time   NA 141 06/27/2016 1155   K 4.1 06/27/2016 1155   CL 106 03/28/2016 1139   CO2 27 06/27/2016 1155   BUN 12.4 06/27/2016 1155   CREATININE 0.9 06/27/2016 1155      Component Value Date/Time   CALCIUM 10.2 06/27/2016 1155   ALKPHOS 177 (H) 06/27/2016 1155   AST 30 06/27/2016 1155   ALT 27 06/27/2016 1155   BILITOT 0.34 06/27/2016 1155       No results found for: LABCA2  No components found for: LABCA125  No results for input(s): INR in the last 168 hours.  Urinalysis    Component Value Date/Time   COLORURINE YELLOW 03/21/2007 1108   APPEARANCEUR CLEAR 03/21/2007 1108   LABSPEC 1.021 03/21/2007 1108   PHURINE 7.0 03/21/2007 1108   GLUCOSEU 100 (A) 03/21/2007 1108   HGBUR NEGATIVE 03/21/2007 1108   BILIRUBINUR NEGATIVE  03/21/2007 1108   KETONESUR NEGATIVE 03/21/2007 1108   PROTEINUR NEGATIVE 03/21/2007 1108   UROBILINOGEN 1.0 03/21/2007 1108   NITRITE NEGATIVE 03/21/2007 1108   LEUKOCYTESUR SMALL (A) 03/21/2007 1108     STUDIES: Dg Chest 2 View  Result Date: 06/27/2016 CLINICAL DATA:  Shortness breath with exertion, chills x1 day EXAM: CHEST  2 VIEW COMPARISON:  01/01/2016 FINDINGS: Lungs are clear.  No pleural effusion or pneumothorax. The heart is normal in size. Right chest port terminating in the upper SVC. Mild degenerative changes of the visualized thoracolumbar spine. IMPRESSION: No evidence of acute cardiopulmonary disease. Right chest port partially withdrawn, now terminating in the upper SVC. Electronically Signed   By: Julian Hy M.D.   On: 06/27/2016 11:49    ELIGIBLE FOR AVAILABLE RESEARCH PROTOCOL: [facility] [phone]  ASSESSMENT: 66 y.o. [place] woman status post left breast upper outer quadrant biopsy 12/07/2015 for a clinical T2 N0, stage IIA invasive ductal carcinoma, grade 3, triple negative, with an MIB-1 of 35%  (1) genetics testing 01/23/2016  (a) alpha - anti trypsin and ceruloplamin labs drawn 01/19/2016, both normal  (2) Status postft lumpectomy/ sentinel lymph node sampling 01/01/2016 for a pT1c pN0, stage IA  Invasive ductal carcinoma, grade 3, with negative margins.  (3) Mammaprint sent from the patient's 12/07/2015 biopsy returned "high risk", predicting a risk of recurrence of nearly 30% untreated, decreasing to a 93% chance of five-year distant metastasis free survival with chemotherapy.  (4) adjuvant chemotherapy consisting of cyclophosphamide and doxorubicin in dose dense fashion 4 started01/04/2016, completed 03/12/2016, followed by weekly Abraxane 12 starting 04/02/2016, changed on 04/30/16 to Abraxane days 1,8,15, with Neulasta support on 28 day cycle, then changed on 4/17 to Abraxane days 1, 8, on a 21 day cycle with Neulasta on day 9 due to neutropenia  (5)  adjuvant radiation to follow  PLAN: Danira is here today due to concerning symptoms after receviving her chemotherapy.  Her labs are normal.  Chest xray is normal.  Blood cultures are pending.  It sounds more like her body is hot and cold which could likely be from chemotherapy.  Due to the neuropathy, she will receive no further chemotherapy.  EKG is normal.  I will get her in with cardiology for full eval due to her receiving Doxorubicin recently.   Should her chest pressure or shortness of breath worsen she was instructed to go to ER.    She will return on 07/09/2016 for f/u with Dr. Jana Hakim.    A total of (30) minutes of face-to-face time was spent with this patient with greater than 50% of that time in counseling and care-coordination.    Scot Dock, NP   06/27/2016 12:38 PM Medical Oncology and Hematology [facility] [address] [place], [place] [address] Tel. [phone]    Fax. [phone]

## 2016-06-27 NOTE — Telephone Encounter (Signed)
Needs appt with labs prior including blood cultures and chest xray, today during my lunch preferably.

## 2016-06-27 NOTE — Telephone Encounter (Signed)
"  I'm calling back to notify the nurse that since Tuesday,  I feel weaker than ever.  Why do I feel cold with chills?  When I drink my temperature goes even lower.  Checked temperature and do not have a fever.  I'm on my rest week and normally feel better.  Experiencing some shortness of breath and tired.  Experiencing joint pain.  Tingling in hands and toes.  Not able to do stuff like laundry or normal day to day things.  Return number [phone]."   Will notify APP and provider.  Abraxane received 06-18-2016 with [facility].

## 2016-07-02 ENCOUNTER — Ambulatory Visit: Payer: BLUE CROSS/BLUE SHIELD

## 2016-07-02 ENCOUNTER — Other Ambulatory Visit: Payer: BLUE CROSS/BLUE SHIELD

## 2016-07-02 ENCOUNTER — Ambulatory Visit: Payer: BLUE CROSS/BLUE SHIELD | Admitting: Oncology

## 2016-07-03 LAB — CULTURE, BLOOD (SINGLE)

## 2016-07-04 ENCOUNTER — Other Ambulatory Visit: Payer: BLUE CROSS/BLUE SHIELD

## 2016-07-04 DIAGNOSIS — B20 Human immunodeficiency virus [HIV] disease: Secondary | ICD-10-CM

## 2016-07-05 LAB — T-HELPER CELL (CD4) - (RCID CLINIC ONLY)
CD4 % Helper T Cell: 31 % — ABNORMAL LOW (ref 33–55)
CD4 T CELL ABS: 450 /uL (ref 400–2700)

## 2016-07-08 ENCOUNTER — Encounter: Payer: Self-pay | Admitting: *Deleted

## 2016-07-09 ENCOUNTER — Other Ambulatory Visit: Payer: Self-pay | Admitting: Internal Medicine

## 2016-07-09 ENCOUNTER — Ambulatory Visit: Payer: BLUE CROSS/BLUE SHIELD

## 2016-07-09 ENCOUNTER — Ambulatory Visit (HOSPITAL_BASED_OUTPATIENT_CLINIC_OR_DEPARTMENT_OTHER): Payer: BLUE CROSS/BLUE SHIELD | Admitting: Oncology

## 2016-07-09 ENCOUNTER — Encounter: Payer: Self-pay | Admitting: Radiation Oncology

## 2016-07-09 ENCOUNTER — Other Ambulatory Visit (HOSPITAL_BASED_OUTPATIENT_CLINIC_OR_DEPARTMENT_OTHER): Payer: BLUE CROSS/BLUE SHIELD

## 2016-07-09 VITALS — BP 136/68 | HR 91 | Temp 98.1°F | Resp 18 | Ht 65.0 in | Wt 114.3 lb

## 2016-07-09 DIAGNOSIS — Z171 Estrogen receptor negative status [ER-]: Principal | ICD-10-CM

## 2016-07-09 DIAGNOSIS — G620 Drug-induced polyneuropathy: Secondary | ICD-10-CM | POA: Diagnosis not present

## 2016-07-09 DIAGNOSIS — B20 Human immunodeficiency virus [HIV] disease: Secondary | ICD-10-CM

## 2016-07-09 DIAGNOSIS — C50412 Malignant neoplasm of upper-outer quadrant of left female breast: Secondary | ICD-10-CM

## 2016-07-09 LAB — COMPREHENSIVE METABOLIC PANEL
ALT: 31 U/L (ref 0–55)
AST: 32 U/L (ref 5–34)
Albumin: 4.5 g/dL (ref 3.5–5.0)
Alkaline Phosphatase: 91 U/L (ref 40–150)
Anion Gap: 7 mEq/L (ref 3–11)
BUN: 9.4 mg/dL (ref 7.0–26.0)
CALCIUM: 10.2 mg/dL (ref 8.4–10.4)
CHLORIDE: 104 meq/L (ref 98–109)
CO2: 29 meq/L (ref 22–29)
CREATININE: 0.9 mg/dL (ref 0.6–1.1)
EGFR: 71 mL/min/{1.73_m2} — ABNORMAL LOW (ref >90)
GLUCOSE: 59 mg/dL — AB (ref 70–140)
POTASSIUM: 3.9 meq/L (ref 3.5–5.1)
SODIUM: 140 meq/L (ref 136–145)
Total Bilirubin: 0.44 mg/dL (ref 0.20–1.20)
Total Protein: 7.3 g/dL (ref 6.4–8.3)

## 2016-07-09 LAB — CBC WITH DIFFERENTIAL/PLATELET
BASO%: 1.2 % (ref 0.0–2.0)
Basophils Absolute: 0.1 10*3/uL (ref 0.0–0.1)
EOS%: 1.3 % (ref 0.0–7.0)
Eosinophils Absolute: 0.1 10*3/uL (ref 0.0–0.5)
HEMATOCRIT: 40.3 % (ref 34.8–46.6)
HGB: 13.6 g/dL (ref 11.6–15.9)
LYMPH#: 1.2 10*3/uL (ref 0.9–3.3)
LYMPH%: 27.2 % (ref 14.0–49.7)
MCH: 34.3 pg — AB (ref 25.1–34.0)
MCHC: 33.6 g/dL (ref 31.5–36.0)
MCV: 101.9 fL — ABNORMAL HIGH (ref 79.5–101.0)
MONO#: 0.7 10*3/uL (ref 0.1–0.9)
MONO%: 16 % — AB (ref 0.0–14.0)
NEUT#: 2.3 10*3/uL (ref 1.5–6.5)
NEUT%: 54.3 % (ref 38.4–76.8)
Platelets: 282 10*3/uL (ref 145–400)
RBC: 3.96 10*6/uL (ref 3.70–5.45)
RDW: 16.2 % — AB (ref 11.2–14.5)
WBC: 4.3 10*3/uL (ref 3.9–10.3)

## 2016-07-09 LAB — HIV-1 RNA QUANT-NO REFLEX-BLD
HIV 1 RNA Quant: <20 copies/mL — AB
HIV-1 RNA Quant, Log: <1.3 Log copies/mL — AB

## 2016-07-09 NOTE — Progress Notes (Signed)
[place]  [phone] [phone]     ID: [name] DOB: 1951/01/08  MR#: [id number]  TTS#:[id number]  Patient Care Team: Carlyle Basques, MD as PCP - General (Infectious Diseases) Carlyle Basques, MD as PCP - Infectious Diseases (Infectious Diseases) Alphonsa Overall, MD as Consulting Physician (General Surgery) Mechell Girgis, Virgie Dad, MD as Consulting Physician (Oncology) Gery Pray, MD as Consulting Physician (Radiation Oncology) Marcial Pacas, MD as Consulting Physician (Neurology) Druscilla Brownie, MD as Consulting Physician (Dermatology) Delice Bison, Charlestine Massed, NP as Nurse Practitioner (Hematology and Oncology) [name] Cruel, MD OTHER MD:  CHIEF COMPLAINT: Triple negative breast cancer  CURRENT TREATMENT: Adjuvant radiation pending   BREAST CANCER HISTORY:  From the origil intake note:  [name] herself palpated a mass in the upper outer quadrant of her left breast the first week in November, and had bilateral diagnostic mammography with tomography and left breast ultrasonography at the [facility] 12/05/2015. The breast density was category C. In the upper outer left breast there was  A mass with macro lobulated contours which was barely palpable at the 12:30 o'clock position of the left breast 7 cm from the nipple. There was no palpable axillary adenopathy. Ultrasonography confirmed a 2.1 cm obliquely oriented hypoechoic mass in the area in question. Ultrasound of the left axilla was benign.  Biopsy of the left breast mass in question 12/07/2015 showed ([id number]) invasive ductal carcinoma, grade 3, estrogen and progesterone receptor negative, with an MIB-1 of 35%, and no HER-2 amplification, the signals ratio being 0.95 and the number per cell 2.80.  Her subsequent history is as detailed below  INTERVAL HISTORY: [name] returns today for follow-up of her triple negative breast cancer accompanied by her son. We have stopped her  taxing treatments because she was developing significant peripheral neuropathy. Thankfully this is largely resolved. She was having numbness and tingling over her fingers and toes constantly but now this is very intermittent. It particularly affects the first and second digits of both hands, left hand more than the right hand. The toes are "okay except in the morning". She is very pleased that having her sensation back. We are hoping of course at this continues to resolve.  REVIEW OF SYSTEMS: She still tired but is able to do some housework. When she goes outside particularly in the heat she feels short of breath. There have not been any intercurrent infections although she has some sinus symptoms. She has some sensitivity in the surgical breast. A detailed review of systems today was otherwise stable  PAST MEDICAL HISTORY: Past Medical History:  Diagnosis Date  . HIV (human immunodeficiency virus infection) ([place])   . Hypertension   . Malignant neoplasm of upper-outer quadrant of left female breast ([place]) 12/11/2015  . Migraine   . Neuropathy   . PONV (postoperative nausea and vomiting)    said usually has to have scop patch  . Rash and nonspecific skin eruption 10/16/2015  . Spinal stenosis     PAST SURGICAL HISTORY: Past Surgical History:  Procedure Laterality Date  . ABDOMINAL HYSTERECTOMY  1990  . BREAST LUMPECTOMY WITH RADIOACTIVE SEED AND SENTINEL LYMPH NODE BIOPSY Left 01/01/2016   Procedure: LEFT BREAST LUMPECTOMY WITH RADIOACTIVE SEED AND SENTINEL LYMPH NODE BIOPSY;  Surgeon: Alphonsa Overall, MD;  Location: [facility];  Service: General;  Laterality: Left;  . CARPAL TUNNEL RELEASE     both hands  . CERVICAL FUSION  1989  . PORTACATH PLACEMENT Right 01/01/2016   Procedure: INSERTION PORT-A-CATH  WITH [name];  Surgeon: Alphonsa Overall, MD;  Location: [facility];  Service: General;  Laterality: Right;  . TONSILLECTOMY      FAMILY HISTORY Family History  Problem  Relation Age of Onset  . High blood pressure Mother   . High Cholesterol Mother   . Cancer Father   . High blood pressure Father   . High Cholesterol Father   . Lung cancer Maternal Grandfather   . Breast cancer Maternal Aunt 18  The patient's father died from heart disease at the age of 59. The patient's mother died from "genetic cirrhosis" at the age of 107. The patient had 2 brothers, 5 sisters. A maternal aunt was diagnosed with breast cancer at age 9. There is no other history of breast or ovarian cancer in the family to the patient's knowledge   GYNECOLOGIC HISTORY:  No LMP recorded. Patient has had a hysterectomy. Menarche age 59, first live birth age 51. Patient is GX P1. She underwent hysterectomy with bilateral salpingo-oophorectomy at age 33. She took hormone replacement for approximately 10 years, until age 7. She also had oral contraceptives remotely for 8-10 years, with no complications.   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 At home is just she and her husband [name] who is retired from Teaching laboratory technician work. Their son [name] works as a Chief Strategy Officer for rest home in [place]. The patient has 3 grandchildren. She is not a Ambulance person.   ADVANCED DIRECTIVES: The patient has a living will in place   HEALTH MAINTENANCE: Social History  Substance Use Topics  . Smoking status: Never Smoker  . Smokeless tobacco: Never Used  . Alcohol use 0.6 oz/week    1 Glasses of wine per week     Comment: on weekends     Colonoscopy: 2012/be routinely  PAP: Status post hysterectomy  Bone density: Never   No Known Allergies  Current Outpatient Prescriptions  Medication Sig Dispense Refill  . amLODipine (NORVASC) 5 MG tablet Take 1 tablet (5 mg total) by mouth daily. 30 tablet 5  . benazepril (LOTENSIN) 40 MG tablet Take 1 tablet (40 mg total) by mouth daily. 30 tablet 5  . Coenzyme Q10 300 MG CAPS Take 300 capsules by mouth daily.    .  cyclobenzaprine (FLEXERIL) 10 MG tablet Take 1 tablet (10 mg total) by mouth 3 (three) times daily as needed for muscle spasms. 90 tablet 3  . DESCOVY 200-25 MG tablet TAKE ONE TABLET BY MOUTH EVERY DAY 30 tablet 5  . flunisolide (NASALIDE) 25 MCG/ACT (0.025%) SOLN Place 2 sprays into the nose 2 (two) times daily. 1 Bottle 3  . gabapentin (NEURONTIN) 100 MG capsule Take 1 capsule (100 mg total) by mouth 2 (two) times daily. 180 capsule 1  . lidocaine-prilocaine (EMLA) cream APPLY TO AFFECTED AREA(S) EVERY DAY  3  . LORazepam (ATIVAN) 0.5 MG tablet Take 0.5 mg by mouth every 6 (six) hours as needed.  0  . Multiple Vitamin (MULTIVITAMIN WITH MINERALS) TABS tablet Take 1 tablet by mouth daily.    . Omega-3 Fatty Acids (FISH OIL) 1200 MG CAPS Take 1,200 mg by mouth 2 (two) times daily.      . prochlorperazine (COMPAZINE) 10 MG tablet Take 1 tablet (10 mg total) by mouth every 6 (six) hours as needed (Nausea or vomiting). 30 tablet 1  . rosuvastatin (CRESTOR) 5 MG tablet Take 1 tablet (5 mg total) by mouth at bedtime. 30 tablet 5  .  sulfamethoxazole-trimethoprim (BACTRIM,SEPTRA) 400-80 MG tablet TAKE ONE TABLET BY MOUTH EVERY DAY 30 tablet 5  . TIVICAY 50 MG tablet TAKE ONE TABLET BY MOUTH EVERY DAY 30 tablet 5   No current facility-administered medications for this visit.    Facility-Administered Medications Ordered in Other Visits  Medication Dose Route Frequency Provider Last Rate Last Dose  . sodium chloride flush (NS) 0.9 % injection 10 mL  10 mL Intravenous PRN Cathleen Yagi, Virgie Dad, MD   10 mL at 05/21/16 1022    OBJECTIVE:Middle-aged white woman appears stated age  Vitals:   07/09/16 0932  BP: 136/68  Pulse: 91  Resp: 18  Temp: 98.1 F (36.7 C)     Body mass index is 19.02 kg/m.    ECOG FS:1 - Symptomatic but completely ambulatory   Sclerae unicteric, pupils round and equal Oropharynx clear and moist No cervical or supraclavicular adenopathy Lungs no rales or rhonchi Heart regular  rate and rhythm Abd soft, nontender, positive bowel sounds MSK no focal spinal tenderness, no upper extremity lymphedema Neuro: nonfocal, well oriented, appropriate affect Breasts: The right breast is benign. The left breast is status post lumpectomy. There is no evidence of local recurrence. The cosmetic result is good. Both axillae are benign.  LAB RESULTS:  CMP     Component Value Date/Time   NA 141 06/27/2016 1155   K 4.1 06/27/2016 1155   CL 106 03/28/2016 1139   CO2 27 06/27/2016 1155   GLUCOSE 104 06/27/2016 1155   BUN 12.4 06/27/2016 1155   CREATININE 0.9 06/27/2016 1155   CALCIUM 10.2 06/27/2016 1155   PROT 7.5 06/27/2016 1155   ALBUMIN 4.6 06/27/2016 1155   AST 30 06/27/2016 1155   ALT 27 06/27/2016 1155   ALKPHOS 177 (H) 06/27/2016 1155   BILITOT 0.34 06/27/2016 1155   GFRNONAA 87 03/28/2016 1139   GFRAA >89 03/28/2016 1139    INo results found for: SPEP, UPEP  Lab Results  Component Value Date   WBC 4.3 07/09/2016   NEUTROABS 2.3 07/09/2016   HGB 13.6 07/09/2016   HCT 40.3 07/09/2016   MCV 101.9 (H) 07/09/2016   PLT 282 07/09/2016      Chemistry      Component Value Date/Time   NA 141 06/27/2016 1155   K 4.1 06/27/2016 1155   CL 106 03/28/2016 1139   CO2 27 06/27/2016 1155   BUN 12.4 06/27/2016 1155   CREATININE 0.9 06/27/2016 1155      Component Value Date/Time   CALCIUM 10.2 06/27/2016 1155   ALKPHOS 177 (H) 06/27/2016 1155   AST 30 06/27/2016 1155   ALT 27 06/27/2016 1155   BILITOT 0.34 06/27/2016 1155       No results found for: LABCA2  No components found for: LABCA125  No results for input(s): INR in the last 168 hours.  Urinalysis    Component Value Date/Time   COLORURINE YELLOW 03/21/2007 1108   APPEARANCEUR CLEAR 03/21/2007 1108   LABSPEC 1.021 03/21/2007 1108   PHURINE 7.0 03/21/2007 1108   GLUCOSEU 100 (A) 03/21/2007 1108   HGBUR NEGATIVE 03/21/2007 1108   BILIRUBINUR NEGATIVE 03/21/2007 1108   KETONESUR NEGATIVE  03/21/2007 1108   PROTEINUR NEGATIVE 03/21/2007 1108   UROBILINOGEN 1.0 03/21/2007 1108   NITRITE NEGATIVE 03/21/2007 1108   LEUKOCYTESUR SMALL (A) 03/21/2007 1108     STUDIES: Dg Chest 2 View  Result Date: 06/27/2016 CLINICAL DATA:  Shortness breath with exertion, chills x1 day EXAM: CHEST  2 VIEW COMPARISON:  01/01/2016 FINDINGS: Lungs are clear.  No pleural effusion or pneumothorax. The heart is normal in size. Right chest port terminating in the upper SVC. Mild degenerative changes of the visualized thoracolumbar spine. IMPRESSION: No evidence of acute cardiopulmonary disease. Right chest port partially withdrawn, now terminating in the upper SVC. Electronically Signed   By: Julian Hy M.D.   On: 06/27/2016 11:49    ELIGIBLE FOR AVAILABLE RESEARCH PROTOCOL: [facility] [phone]  ASSESSMENT: 66 y.o. [place] woman status post left breast upper outer quadrant biopsy 12/07/2015 for a clinical T2 N0, stage IIA invasive ductal carcinoma, grade 3, triple negative, with an MIB-1 of 35%  (1) genetics testing 01/23/2016  (a) alpha - anti trypsin and ceruloplamin labs drawn 01/19/2016, both normal  (2) Status postf Left lumpectomy/ sentinel lymph node sampling 01/01/2016 for a pT1c pN0, stage IA  Invasive ductal carcinoma, grade 3, with negative margins.  (3) Mammaprint sent from the patient's 12/07/2015 biopsy returned "high risk", predicting a risk of recurrence of nearly 30% untreated, decreasing to a 93% chance of five-year distant metastasis free survival with chemotherapy.  (4) adjuvant chemotherapy consisting of cyclophosphamide and doxorubicin in dose dense fashion 4 started 02/01/2016, completed 03/12/2016, followed by weekly Abraxane 12 starting 04/02/2016,  (a) changed on 04/30/16 to Abraxane days 1,8,15, with Neulasta support on day 16 of each 28 day cycle,   (b) then changed on 4/17 to Abraxane days 1, 8, on a 21 day cycle with Neulasta on day 9 due to neutropenia  (c) taxanes  stopped after 9 doses due to neutropenia, last dose 06/18/2016  (5) adjuvant radiation to follow  PLAN: Asal is done with chemotherapy and the nice thing is that the peripheral neuropathy has largely resolved. Hopefully it will continue to resolve completely over the next several weeks.  Her CD4 count did drop below 300 with treatment but now it is above 400 and hopefully on the way back up to her baseline which was in the 800-900 range. She has not had any intercurrent infections are all these treatments.  She still gets tired. I encouraged her to push her soft distal little bit in preparation for radiation which also will make her feel fatigued once gets going.  I reassured her that some tenderness in the surgical breast is common and does not indicate breast cancer recurrence.  I also encouraged her to go ahead and get her teeth cleaned and otherwise taking care of. In fact it would be best for her to think of herself as "normal" at this point although it really does take about a year for the hair, skin, joints, and immune system to fully recover from treatment  I will see her again late August by which time she should be just about getting done with radiation. She knows to call for any problems that may develop before that visit.    [name] Cruel, MD   07/09/2016 9:35 AM Medical Oncology and Hematology [facility] [address] [place], [place] [address] Tel. [phone]    Fax. [phone]

## 2016-07-10 ENCOUNTER — Telehealth (HOSPITAL_COMMUNITY): Payer: Self-pay | Admitting: Vascular Surgery

## 2016-07-10 NOTE — Telephone Encounter (Signed)
Left pt message to make NP brst  appt w/ DB tomorrow

## 2016-07-11 ENCOUNTER — Encounter (HOSPITAL_COMMUNITY): Payer: Self-pay | Admitting: Internal Medicine

## 2016-07-11 ENCOUNTER — Ambulatory Visit (HOSPITAL_BASED_OUTPATIENT_CLINIC_OR_DEPARTMENT_OTHER)
Admission: RE | Admit: 2016-07-11 | Discharge: 2016-07-11 | Disposition: A | Discharge status: Home / Self Care | Payer: BLUE CROSS/BLUE SHIELD | Source: Ambulatory Visit | Attending: Internal Medicine | Admitting: Internal Medicine

## 2016-07-11 ENCOUNTER — Ambulatory Visit (HOSPITAL_COMMUNITY)
Admission: RE | Admit: 2016-07-11 | Discharge: 2016-07-11 | Disposition: A | Discharge status: Home / Self Care | Payer: BLUE CROSS/BLUE SHIELD | Source: Ambulatory Visit | Attending: Internal Medicine | Admitting: Internal Medicine

## 2016-07-11 VITALS — BP 112/80 | HR 96 | Wt 144.8 lb

## 2016-07-11 DIAGNOSIS — G43909 Migraine, unspecified, not intractable, without status migrainosus: Secondary | ICD-10-CM | POA: Diagnosis not present

## 2016-07-11 DIAGNOSIS — R079 Chest pain, unspecified: Secondary | ICD-10-CM

## 2016-07-11 DIAGNOSIS — B20 Human immunodeficiency virus [HIV] disease: Secondary | ICD-10-CM | POA: Insufficient documentation

## 2016-07-11 DIAGNOSIS — Z79899 Other long term (current) drug therapy: Secondary | ICD-10-CM | POA: Insufficient documentation

## 2016-07-11 DIAGNOSIS — R0600 Dyspnea, unspecified: Secondary | ICD-10-CM

## 2016-07-11 DIAGNOSIS — C50919 Malignant neoplasm of unspecified site of unspecified female breast: Secondary | ICD-10-CM | POA: Diagnosis not present

## 2016-07-11 DIAGNOSIS — R0789 Other chest pain: Secondary | ICD-10-CM | POA: Insufficient documentation

## 2016-07-11 DIAGNOSIS — I10 Essential (primary) hypertension: Secondary | ICD-10-CM | POA: Diagnosis not present

## 2016-07-11 LAB — ECHOCARDIOGRAM COMPLETE: Weight: 2316.8 oz

## 2016-07-11 NOTE — Progress Notes (Signed)
Advanced Heart Failure Medication Review by a Pharmacist  Does the patient  feel that his/her medications are working for him/her?  yes  Has the patient been experiencing any side effects to the medications prescribed?  no  Does the patient measure his/her own blood pressure or blood glucose at home?  yes   Does the patient have any problems obtaining medications due to transportation or finances?   no  Understanding of regimen: good Understanding of indications: good Potential of compliance: good Patient understands to avoid NSAIDs. Patient understands to avoid decongestants.  Issues to address at subsequent visits: none   Pharmacist comments: [name] is a pleasant 66 yo female presenting with her son. Patient did not bring medication bottles but has a good understanding of medication regimen. No issues at this time.   [name], Pharm.D. PGY1 Pharmacy Resident 6/14/201811:27 AM Pager [phone]     Time with patient: 8 mins  Preparation and documentation time: 2 mins  Total time: 10 mins

## 2016-07-11 NOTE — Patient Instructions (Signed)
Your physician has requested that you have an echocardiogram. Echocardiography is a painless test that uses sound waves to create images of your heart. It provides your doctor with information about the size and shape of your heart and how well your heart's chambers and valves are working. This procedure takes approximately one hour. There are no restrictions for this procedure.  TODAY  Your physician recommends that you schedule a follow-up appointment in: 2 months

## 2016-07-11 NOTE — Progress Notes (Signed)
  Echocardiogram 2D Echocardiogram has been performed.  [name] M 07/11/2016, 1:23 PM

## 2016-07-11 NOTE — Progress Notes (Signed)
Cardio-Oncology Clinic Consult  Note   Referring Physician: Dr. Jana Hakim Primary Cardiologist: New to Dr. Haroldine Laws   HPI: Ms. Pohlman is a 66 year old female with a past medical history of breast CA (doxorubicin x 4 started on 02/01/16 and completed 03/12/16). Also with history of HTN and HIV. She is referred by Dr. Jana Hakim for further evaluation of CP.   She had an Echo in Dec. 2017 that showed an EF 60-65%, no wall motion abnormalities. This was prior to her treatment for breast CA.   She presents today for further evaluation of chest pain. She says that she was mowing the lawn last month for about 15 minutes when she developed palpitations and chest pressure. Pain was substernal without radiation. She did feel SOB at the time, she went inside and sat down and felt better. She has had no further episodes of chest pain or pressure. No family history of CAD, she is not a smoker. Does not drink ETOH, denies illicit drug use. She denies SOB with her daily activities, no SOB with walking around the neighborhood or with stairs. She is set to start radiation for her breast CA next week.   Summary of breast CA treatment:   Diagnosed 11/17 - clinical T2 N0, stage IIA invasive ductal carcinoma, grade 3, triple negative, with an MIB-1 of 35%  (1) Status post Left lumpectomy/ sentinel lymph node sampling 01/01/2016 for a pT1c pN0, stage IA  Invasive ductal carcinoma, grade 3, with negative margins.  (2) adjuvant chemotherapy consisting of cyclophosphamide and doxorubicin in dose dense fashion 4 started 02/01/2016, completed 03/12/2016, followed by weekly Abraxane 12 starting 04/02/2016,             (a) changed on 04/30/16 to Abraxane days 1,8,15, with Neulasta support on day 16 of each 28 day cycle,              (b) then changed on 4/17 to Abraxane days 1, 8, on a 21 day cycle with Neulasta on day 9 due to neutropenia             (c) taxanes stopped after 9 doses due to neutropenia, last dose  06/18/2016  (3) adjuvant radiation to follow  Review of Systems: [y] = yes, [ ]  = no   General: Weight gain [ ] ; Weight loss [ ] ; Anorexia [ ] ; Fatigue [ y]; Fever [ ] ; Chills [ ] ; Weakness [ ]   Cardiac: Chest pain/pressure [ y]; Resting SOB [ ] ; Exertional SOB [ ] ; Orthopnea [ ] ; Pedal Edema [ ] ; Palpitations [ ] ; Syncope [ ] ; Presyncope [ ] ; Paroxysmal nocturnal dyspnea[ ]   Pulmonary: Cough [ ] ; Wheezing[ ] ; Hemoptysis[ ] ; Sputum [ ] ; Snoring [ ]   GI: Vomiting[ ] ; Dysphagia[ ] ; Melena[ ] ; Hematochezia [ ] ; Heartburn[ ] ; Abdominal pain [ ] ; Constipation [ ] ; Diarrhea [ ] ; BRBPR [ ]   GU: Hematuria[ ] ; Dysuria [ ] ; Nocturia[ ]   Vascular: Pain in legs with walking [ ] ; Pain in feet with lying flat [ ] ; Non-healing sores [ ] ; Stroke [ ] ; TIA [ ] ; Slurred speech [ ] ;  Neuro: Headaches[ ] ; Vertigo[ ] ; Seizures[ ] ; Paresthesias[ y];Blurred vision [ ] ; Diplopia [ ] ; Vision changes [ ]   Ortho/Skin: Arthritis [ ] ; Joint pain [ ] ; Muscle pain [ y]; Joint swelling [ ] ; Back Pain [ ] ; Rash [ ]   Psych: Depression[ ] ; Anxiety[ ]   Heme: Bleeding problems [ ] ; Clotting disorders [ ] ; Anemia [ ]   Endocrine: Diabetes [ ] ;  Thyroid dysfunction[ ]    Past Medical History:  Diagnosis Date  . HIV (human immunodeficiency virus infection) ([place])   . Hypertension   . Malignant neoplasm of upper-outer quadrant of left female breast ([place]) 12/11/2015  . Migraine   . Neuropathy   . PONV (postoperative nausea and vomiting)    said usually has to have scop patch  . Rash and nonspecific skin eruption 10/16/2015  . Spinal stenosis     Current Outpatient Prescriptions  Medication Sig Dispense Refill  . amLODipine (NORVASC) 5 MG tablet Take 1 tablet (5 mg total) by mouth daily. 30 tablet 5  . benazepril (LOTENSIN) 40 MG tablet TAKE ONE TABLET BY MOUTH EVERY DAY 30 tablet 5  . Coenzyme Q10 300 MG CAPS Take 300 capsules by mouth daily.    . cyclobenzaprine (FLEXERIL) 10 MG tablet TAKE ONE TABLET BY MOUTH THREE TIMES  DAILY AS NEEDED FOR MUSCLE SPASMS 90 tablet 5  . DESCOVY 200-25 MG tablet TAKE ONE TABLET BY MOUTH EVERY DAY 30 tablet 5  . flunisolide (NASALIDE) 25 MCG/ACT (0.025%) SOLN Place 2 sprays into the nose 2 (two) times daily. 1 Bottle 3  . gabapentin (NEURONTIN) 100 MG capsule Take 1 capsule (100 mg total) by mouth 2 (two) times daily. 180 capsule 1  . Multiple Vitamin (MULTIVITAMIN WITH MINERALS) TABS tablet Take 1 tablet by mouth daily.    . Omega-3 Fatty Acids (FISH OIL) 1200 MG CAPS Take 1,200 mg by mouth 2 (two) times daily.      . rosuvastatin (CRESTOR) 5 MG tablet Take 1 tablet (5 mg total) by mouth at bedtime. 30 tablet 5  . sulfamethoxazole-trimethoprim (BACTRIM,SEPTRA) 400-80 MG tablet TAKE ONE TABLET BY MOUTH EVERY DAY 30 tablet 5  . TIVICAY 50 MG tablet TAKE ONE TABLET BY MOUTH EVERY DAY 30 tablet 5   No current facility-administered medications for this encounter.    Facility-Administered Medications Ordered in Other Encounters  Medication Dose Route Frequency Provider Last Rate Last Dose  . sodium chloride flush (NS) 0.9 % injection 10 mL  10 mL Intravenous PRN Magrinat, Virgie Dad, MD   10 mL at 05/21/16 1022    No Known Allergies    Social History   Social History  . Marital status: Married    Spouse name: [name]  . Number of children: 1  . Years of education: 58   Occupational History  . Retired    Social History Main Topics  . Smoking status: Never Smoker  . Smokeless tobacco: Never Used  . Alcohol use 0.6 oz/week    1 Glasses of wine per week     Comment: on weekends  . Drug use: No  . Sexual activity: Yes     Comment: declined condoms   Other Topics Concern  . Not on file   Social History Narrative   Patient lives at home with her husband [name]). 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   Caffeine - None    Right handed.        Family History  Problem Relation Age of Onset  . High blood pressure Mother   . High Cholesterol Mother   .  Cancer Father   . High blood pressure Father   . High Cholesterol Father   . Lung cancer Maternal Grandfather   . Breast cancer Maternal Aunt 38    Vitals:   07/11/16 1120  BP: 112/80  Pulse: 96  SpO2: 98%  Weight: 144 lb 12.8 oz (65.7  kg)     PHYSICAL EXAM: General:  Ill appearing female, present with son HEENT: normal Neck: supple. No JVD. Carotids 2+ bilat; no bruits. No lymphadenopathy or thyromegaly appreciated. Cor: PMI nondisplaced. Regular rate & rhythm. No rubs, gallops or murmurs. Right upper chest port a cath  Lungs: clear Abdomen: soft, nontender, nondistended. No hepatosplenomegaly. No bruits or masses. Good bowel sounds. Extremities: no cyanosis, clubbing, rash, edema Neuro: alert & oriented x 3, cranial nerves grossly intact. moves all 4 extremities w/o difficulty. Affect pleasant.  ECG: NSR, no ST/T wave abnormalities. Personally reviewed   Echo today reviewed personally: EF 60% no wall motion abnormalities. RV normal  ASSESSMENT & PLAN: 1. Breast CA: Follows with Dr. Jana Hakim.   --s/p left lumpectomy. Completed cyclophosphamide and doxorubicin in dose dense fashion 4 started 02/01/2016, completed 03/12/2016, followed by weekly Abraxane 12  2. Chest pain  --has typical and atypical features. Echo images and ECG reviewed personlly and are normal.  --Will proceed with Myoview to further evaluate  Arbutus Leas, NP 07/11/16   Patient seen and examined with Jettie Booze, NP. We discussed all aspects of the encounter. I have edited the above note extensively and included my thoughts. Echo and ECG are normal. No evidence of adriamycin cardiotoxicity. CP with typical and atypical features. Has not recurred. Given risk factors will proceed with stress testing.   Glori Bickers, MD  8:48 PM

## 2016-07-16 NOTE — Progress Notes (Addendum)
Location of Breast Cancer: upper outer quadrant left breast   Histology per Pathology Report:   01/01/16 Diagnosis 1. Breast, lumpectomy, Left - INVASIVE DUCTAL CARCINOMA, GRADE III/III, SPANNING 1.9 CM. - DUCTAL CARCINOMA IN SITU, HIGH GRADE. - THE SURGICAL RESECTION MARGINS ARE NEGATIVE FOR CARCINOMA. - SEE ONCOLOGY TABLE BELOW. 2. Lymph node, sentinel, biopsy, Left - THERE IS NO EVIDENCE OF CARCINOMA IN 1 OF 1 LYMPH NODE (0/1).  Receptor Status: ER(0%), PR (0%), Her2-neu (neg), Ki-(35%)  Did patient present with symptoms (if so, please note symptoms) or was this found on screening mammography?: Lashai herself palpated a mass in the upper outer quadrant of her left breast the first week in November  Past/Anticipated interventions by surgeon, if any: 01/01/16 -Procedure: LEFT BREAST LUMPECTOMY WITH RADIOACTIVE SEED AND SENTINEL LYMPH NODE BIOPSY;  Surgeon: Alphonsa Overall, MD   Past/Anticipated interventions by medical oncology, if any: adjuvant chemotherapy consisting of cyclophosphamide and doxorubicin in dose dense fashion 4 started 02/01/2016, completed 03/12/2016, followed by weekly Abraxane 12 starting 04/02/2016, (a) changed on 04/30/16 to Abraxane days 1,8,15, with Neulasta support on day 16 of each 28 day cycle, (b) then changed on 4/17 to Abraxane days 1, 8, on a 21 day cycle with Neulasta on day 9 due to neutropenia  (c) taxanes stopped after 9 doses due to neutropenia, last dose 06/18/2016  Lymphedema issues, if any:  no  Pain issues, if any:  no - does have neuropathy in her fingers and toes.  SAFETY ISSUES:  Prior radiation? no  Pacemaker/ICD? no  Possible current pregnancy?no  Is the patient on methotrexate? no  Current Complaints / other details:  Patient is HIV positive.  She reports that the dye she received during her lumpectomy surgery caused her to have 4 dark spots on her breast.  She is concerned about the tattoos needed for radiation will cause the same  reaction.  BP 136/80 (BP Location: Right Arm, Patient Position: Sitting)   Pulse (!) 103   Temp 98.2 F (36.8 C) (Oral)   Ht '5\' 5"'  (1.651 m)   Wt 144 lb (65.3 kg)   SpO2 100%   BMI 23.96 kg/m    Wt Readings from Last 3 Encounters:  07/17/16 144 lb (65.3 kg)  07/11/16 144 lb 12.8 oz (65.7 kg)  07/09/16 114 lb 4.8 oz (51.8 kg)      Juliann Olesky, Craige Cotta, RN 07/16/2016,1:27 PM

## 2016-07-17 ENCOUNTER — Encounter: Payer: Self-pay | Admitting: Radiation Oncology

## 2016-07-17 ENCOUNTER — Ambulatory Visit
Admission: RE | Admit: 2016-07-17 | Discharge: 2016-07-17 | Disposition: A | Discharge status: Home / Self Care | Payer: BLUE CROSS/BLUE SHIELD | Source: Ambulatory Visit | Attending: Radiation Oncology | Admitting: Radiation Oncology

## 2016-07-17 DIAGNOSIS — Z803 Family history of malignant neoplasm of breast: Secondary | ICD-10-CM | POA: Insufficient documentation

## 2016-07-17 DIAGNOSIS — Z79899 Other long term (current) drug therapy: Secondary | ICD-10-CM | POA: Diagnosis not present

## 2016-07-17 DIAGNOSIS — C50412 Malignant neoplasm of upper-outer quadrant of left female breast: Secondary | ICD-10-CM | POA: Insufficient documentation

## 2016-07-17 DIAGNOSIS — Z8249 Family history of ischemic heart disease and other diseases of the circulatory system: Secondary | ICD-10-CM | POA: Insufficient documentation

## 2016-07-17 DIAGNOSIS — Z800 Family history of malignant neoplasm of digestive organs: Secondary | ICD-10-CM | POA: Diagnosis not present

## 2016-07-17 DIAGNOSIS — Z90710 Acquired absence of both cervix and uterus: Secondary | ICD-10-CM | POA: Diagnosis not present

## 2016-07-17 DIAGNOSIS — I10 Essential (primary) hypertension: Secondary | ICD-10-CM | POA: Insufficient documentation

## 2016-07-17 DIAGNOSIS — Z21 Asymptomatic human immunodeficiency virus [HIV] infection status: Secondary | ICD-10-CM | POA: Diagnosis not present

## 2016-07-17 DIAGNOSIS — G629 Polyneuropathy, unspecified: Secondary | ICD-10-CM | POA: Diagnosis not present

## 2016-07-17 DIAGNOSIS — Z981 Arthrodesis status: Secondary | ICD-10-CM | POA: Insufficient documentation

## 2016-07-17 DIAGNOSIS — Z923 Personal history of irradiation: Secondary | ICD-10-CM | POA: Insufficient documentation

## 2016-07-17 DIAGNOSIS — Z510 Encounter for antineoplastic radiation therapy: Secondary | ICD-10-CM | POA: Insufficient documentation

## 2016-07-17 DIAGNOSIS — Z171 Estrogen receptor negative status [ER-]: Secondary | ICD-10-CM | POA: Diagnosis not present

## 2016-07-17 NOTE — Progress Notes (Signed)
Radiation Oncology         (336) [phone] ________________________________  Follow-up Visit  Name: [name] MRN: [id number]  Date: 07/17/2016  DOB: 03-Aug-1950  QV:ZDGLOV, Caren Griffins, MD  Magrinat, Virgie Dad, MD   REFERRING PHYSICIAN: Magrinat, Virgie Dad, MD  DIAGNOSIS: Clinical Stage IIA (T2 N0) Left Breast UOQ Invasive Ductal Carcinoma, ER- / PR- / Her2-, Grade 3, post left breast lumpectomy and adjuvant chemotherapy   The encounter diagnosis was Malignant neoplasm of upper-outer quadrant of left breast in female, estrogen receptor negative ([place]).   CHIEF COMPLAINT: Here to discuss management of left breast cancer  NARRATIVE::[name] is a 66 y.o. female who was originally seen in our multidisciplinary breast clinic on 12/13/2015. In the interim, the patient underwent left breast lumpectomy on 01/01/2016 with Dr. Lucia Gaskins. Final pathology showed invasive ductal carcinoma, grade III/III, spanning 1.9 cm, ER- / PR- / Ki-67 35%. There was also DCIS, high grade. The surgical resection margins were negative for carcinoma. Left sentinel lymph node showed no evidence of carcinoma.   The patient received adjuvant chemotherapy consisting of cyclophosphamide and doxorubicin in dose dense fashion x4 started 02/01/2016, completed 03/12/2016, followed by weekly Abraxane x12 starting 04/02/2016, (a) changed on 04/30/2016 to Abraxane days 1. 8, 15 with Neulasta support on day 16 of each 28 day cycle, (b) then changed on 05/14/2016 to Abraxane days 1, 8 on a 21 day cycle with Neulasta on day 9 due to neutropenia, (c) taxanes stopped after 9 doses due to neutropenia, last dose 06/18/2016. She received this with medical oncologist, Dr. Jana Hakim.   The patient is here to discuss radiation treatment in the management of her disease.  On review of systems, the patient reports she is doing well overall. She denies lymphedema issues. She reports occasional twinges in the breast. She denies pain, however she  does report neuropathy in her fingers and toe which occasionally radiates up her arm. She reports staying cold a lot since receiving chemotherapy. She denies any issues with holding her breath. She has no other concerns or complaints at this time.   PREVIOUS RADIATION THERAPY: No  PAST MEDICAL HISTORY:  has a past medical history of HIV (human immunodeficiency virus infection) ([place]); Hypertension; Malignant neoplasm of upper-outer quadrant of left female breast ([place]) (12/11/2015); Migraine; Neuropathy; PONV (postoperative nausea and vomiting); Rash and nonspecific skin eruption (10/16/2015); and Spinal stenosis.    PAST SURGICAL HISTORY: Past Surgical History:  Procedure Laterality Date  . ABDOMINAL HYSTERECTOMY  1990  . BREAST LUMPECTOMY WITH RADIOACTIVE SEED AND SENTINEL LYMPH NODE BIOPSY Left 01/01/2016   Procedure: LEFT BREAST LUMPECTOMY WITH RADIOACTIVE SEED AND SENTINEL LYMPH NODE BIOPSY;  Surgeon: Alphonsa Overall, MD;  Location: [facility];  Service: General;  Laterality: Left;  . CARPAL TUNNEL RELEASE     both hands  . CERVICAL FUSION  1989  . PORTACATH PLACEMENT Right 01/01/2016   Procedure: INSERTION PORT-A-CATH WITH [name];  Surgeon: Alphonsa Overall, MD;  Location: [facility];  Service: General;  Laterality: Right;  . TONSILLECTOMY      FAMILY HISTORY: family history includes Breast cancer (age of onset: 65) in her maternal aunt; Cancer in her father; High Cholesterol in her father and mother; High blood pressure in her father and mother; Lung cancer in her maternal grandfather; Stomach cancer in her father.  SOCIAL HISTORY:  reports that she has never smoked. She has never used smokeless tobacco. She reports that she drinks about 0.6 oz of alcohol per  week . She reports that she does not use drugs.  ALLERGIES: Patient has no known allergies.  MEDICATIONS:  Current Outpatient Prescriptions  Medication Sig Dispense Refill  . amLODipine (NORVASC) 5 MG tablet  Take 1 tablet (5 mg total) by mouth daily. 30 tablet 5  . benazepril (LOTENSIN) 40 MG tablet TAKE ONE TABLET BY MOUTH EVERY DAY 30 tablet 5  . Coenzyme Q10 300 MG CAPS Take 300 capsules by mouth daily.    . cyclobenzaprine (FLEXERIL) 10 MG tablet TAKE ONE TABLET BY MOUTH THREE TIMES DAILY AS NEEDED FOR MUSCLE SPASMS 90 tablet 5  . DESCOVY 200-25 MG tablet TAKE ONE TABLET BY MOUTH EVERY DAY 30 tablet 5  . flunisolide (NASALIDE) 25 MCG/ACT (0.025%) SOLN Place 2 sprays into the nose 2 (two) times daily. 1 Bottle 3  . gabapentin (NEURONTIN) 100 MG capsule Take 1 capsule (100 mg total) by mouth 2 (two) times daily. 180 capsule 1  . Multiple Vitamin (MULTIVITAMIN WITH MINERALS) TABS tablet Take 1 tablet by mouth daily.    . Omega-3 Fatty Acids (FISH OIL) 1200 MG CAPS Take 1,200 mg by mouth 2 (two) times daily.      . rosuvastatin (CRESTOR) 5 MG tablet Take 1 tablet (5 mg total) by mouth at bedtime. 30 tablet 5  . sulfamethoxazole-trimethoprim (BACTRIM,SEPTRA) 400-80 MG tablet TAKE ONE TABLET BY MOUTH EVERY DAY 30 tablet 5  . TIVICAY 50 MG tablet TAKE ONE TABLET BY MOUTH EVERY DAY 30 tablet 5   No current facility-administered medications for this encounter.    Facility-Administered Medications Ordered in Other Encounters  Medication Dose Route Frequency Provider Last Rate Last Dose  . sodium chloride flush (NS) 0.9 % injection 10 mL  10 mL Intravenous PRN Magrinat, Virgie Dad, MD   10 mL at 05/21/16 1022     PHYSICAL EXAM:  height is '5\' 5"'  (1.651 m) and weight is 144 lb (65.3 kg). Her oral temperature is 98.2 F (36.8 C). Her blood pressure is 136/80 and her pulse is 103 (abnormal). Her oxygen saturation is 100%.   General: Alert and oriented, in no acute distress HEENT: Head is normocephalic. Extraocular movements are intact. Oropharynx is clear. Neck: Neck is supple, no palpable cervical or supraclavicular lymphadenopathy. Heart: Regular in rate and rhythm with no murmurs, rubs, or  gallops. Chest: Clear to auscultation bilaterally, with no rhonchi, wheezes, or rales. Abdomen: Soft, nontender, nondistended, with no rigidity or guarding. Extremities: No cyanosis or edema. Lymphatics: see Neck Exam Skin: No concerning lesions. Musculoskeletal: symmetric strength and muscle tone throughout. Neurologic: Cranial nerves II through XII are grossly intact. No obvious focalities. Speech is fluent. Coordination is intact. Psychiatric: Judgment and insight are intact. Affect is appropriate. Breast: Right breast large and pendulous without palpable mass, nipple discharge or bleeding. Left breast large and pendulous without palpable mass, nipple discharge or bleeding. Patient has a well healed scar within the upper central aspect of the left breast from lumpectomy.   LABORATORY DATA:  Lab Results  Component Value Date   WBC 4.3 07/09/2016   HGB 13.6 07/09/2016   HCT 40.3 07/09/2016   MCV 101.9 (H) 07/09/2016   PLT 282 07/09/2016   NEUTROABS 2.3 07/09/2016   Lab Results  Component Value Date   NA 140 07/09/2016   K 3.9 07/09/2016   CL 106 03/28/2016   CO2 29 07/09/2016   GLUCOSE 59 (L) 07/09/2016   CREATININE 0.9 07/09/2016   CALCIUM 10.2 07/09/2016      RADIOGRAPHY: Dg  Chest 2 View  Result Date: 06/27/2016 CLINICAL DATA:  Shortness breath with exertion, chills x1 day EXAM: CHEST  2 VIEW COMPARISON:  01/01/2016 FINDINGS: Lungs are clear.  No pleural effusion or pneumothorax. The heart is normal in size. Right chest port terminating in the upper SVC. Mild degenerative changes of the visualized thoracolumbar spine. IMPRESSION: No evidence of acute cardiopulmonary disease. Right chest port partially withdrawn, now terminating in the upper SVC. Electronically Signed   By: Julian Hy M.D.   On: 06/27/2016 11:49      IMPRESSION: Clinical Stage IIA (T2 N0) Left Breast UOQ Invasive Ductal Carcinoma, ER- / PR- / Her2-, Grade 3, post left breast lumpectomy and adjuvant  chemotherapy  Patient would be a good candidate for breast conservation with radiation treatment directed at the left breast. We discussed the course of treatment, techniques available, and potential toxicities. The patient wishes to proceed with this treatment.  PLAN: She has been scheduled for CT simulation on June 21st at 1 pm. Because of the patient's large sized breast and HIV, the patient does not wish to proceed with hypo-fractionated treatment for fear of excessive skin reaction and fibrosis. We will consider deep inspiration breath technique if needed for heart issues.       ------------------------------------------------  Blair Promise, PhD, MD  This document serves as a record of services personally performed by Gery Pray, MD. It was created on his behalf by Arlyce Harman, a trained medical scribe. The creation of this record is based on the scribe's personal observations and the provider's statements to them. This document has been checked and approved by the attending provider.

## 2016-07-17 NOTE — Progress Notes (Signed)
Please see the Nurse Progress Note in the MD Initial Consult Encounter for this patient. 

## 2016-07-18 ENCOUNTER — Encounter: Payer: Self-pay | Admitting: Internal Medicine

## 2016-07-18 ENCOUNTER — Ambulatory Visit
Admission: RE | Admit: 2016-07-18 | Discharge: 2016-07-18 | Disposition: A | Discharge status: Home / Self Care | Payer: BLUE CROSS/BLUE SHIELD | Source: Ambulatory Visit | Attending: Radiation Oncology | Admitting: Radiation Oncology

## 2016-07-18 ENCOUNTER — Ambulatory Visit (INDEPENDENT_AMBULATORY_CARE_PROVIDER_SITE_OTHER): Payer: BLUE CROSS/BLUE SHIELD | Admitting: Internal Medicine

## 2016-07-18 VITALS — BP 131/81 | HR 94 | Temp 97.7°F | Ht 65.0 in | Wt 143.0 lb

## 2016-07-18 DIAGNOSIS — G620 Drug-induced polyneuropathy: Secondary | ICD-10-CM

## 2016-07-18 DIAGNOSIS — E7800 Pure hypercholesterolemia, unspecified: Secondary | ICD-10-CM | POA: Diagnosis not present

## 2016-07-18 DIAGNOSIS — T451X5A Adverse effect of antineoplastic and immunosuppressive drugs, initial encounter: Secondary | ICD-10-CM

## 2016-07-18 DIAGNOSIS — I10 Essential (primary) hypertension: Secondary | ICD-10-CM | POA: Diagnosis not present

## 2016-07-18 DIAGNOSIS — B20 Human immunodeficiency virus [HIV] disease: Secondary | ICD-10-CM | POA: Diagnosis not present

## 2016-07-18 DIAGNOSIS — C50412 Malignant neoplasm of upper-outer quadrant of left female breast: Secondary | ICD-10-CM

## 2016-07-18 DIAGNOSIS — Z510 Encounter for antineoplastic radiation therapy: Secondary | ICD-10-CM | POA: Diagnosis not present

## 2016-07-18 DIAGNOSIS — Z171 Estrogen receptor negative status [ER-]: Principal | ICD-10-CM

## 2016-07-18 MED ORDER — CYCLOBENZAPRINE HCL 10 MG PO TABS: ORAL_TABLET | ORAL | 5 refills | Status: DC

## 2016-07-18 MED ORDER — BENAZEPRIL HCL 40 MG PO TABS: 40.0000 mg | ORAL_TABLET | Freq: Every day | ORAL | 5 refills | Status: DC

## 2016-07-18 MED ORDER — AMLODIPINE BESYLATE 5 MG PO TABS: 5.0000 mg | ORAL_TABLET | Freq: Every day | ORAL | 5 refills | Status: DC

## 2016-07-18 MED ORDER — ROSUVASTATIN CALCIUM 5 MG PO TABS: 5.0000 mg | ORAL_TABLET | Freq: Every day | ORAL | 5 refills | Status: DC

## 2016-07-18 NOTE — Progress Notes (Signed)
  Radiation Oncology         (336) [phone] ________________________________  Name: [name] MRN: [id number]  Date: 07/18/2016  DOB: Feb 26, 1950  SIMULATION AND TREATMENT PLANNING NOTE    ICD-10-CM   1. Malignant neoplasm of upper-outer quadrant of left breast in female, estrogen receptor negative ([place]) C50.412    Z17.1     DIAGNOSIS:  Clinical Stage IIA (T2 N0)LeftBreast UOQ Invasive Ductal Carcinoma, ER-/ PR-/ Her2-, Grade 3, post left breast lumpectomy and adjuvant chemotherapy  NARRATIVE:  The patient was brought to the [facility].  Identity was confirmed.  All relevant records and images related to the planned course of therapy were reviewed.  The patient freely provided informed written consent to proceed with treatment after reviewing the details related to the planned course of therapy. The consent form was witnessed and verified by the simulation staff.  Then, the patient was set-up in a stable reproducible  supine position for radiation therapy.  CT images were obtained.  Surface markings were placed.  The CT images were loaded into the planning software.  Then the target and avoidance structures were contoured.  Treatment planning then occurred.  The radiation prescription was entered and confirmed.  Then, I designed and supervised the construction of a total of 3 medically necessary complex treatment devices.  I have requested : 3D Simulation  I have requested a DVH of the following structures: heart, lungs, lumpectomy cavity.  I have ordered:CBC  PLAN:  The patient will receive 50.4 Gy in 28 fractions Followed by a boost to the lumpectomy cavity of 10 gray in 5 fractions for a cumulative dose of 60.4 gray.   -----------------------------------  Blair Promise, PhD, MD

## 2016-07-18 NOTE — Progress Notes (Signed)
Patient ID: [name], female   DOB: [birth year], 66 y.o.   MRN: [id number]  HPI  [name] is a 66 y.o. F with HIV disease, dx with invasive ductal carcinoma, grade III/III, spanning 1.9 cm, ER- / PR- / Ki-67 35%. There was also DCIS, high grade. The surgical resection margins were negative for carcinoma. Left sentinel lymph node showed no evidence of carcinoma in early December 2017. She underwent adjuvant chemotherapy consisting of cyclophosphamide and doxorubicin in dose dense fashion x4 started 02/01/2016, completed 03/12/2016, followed by weekly Abraxane x12 starting 04/02/2016, (a) changed on 04/30/2016 to Abraxane days 1. 8, 15 with Neulasta support on day 16 of each 28 day cycle, (b) then changed on 05/14/2016 to Abraxane days 1, 8 on a 21 day cycle with Neulasta on day 9 due to neutropenia, (c) taxanes stopped after 9 doses due to peripheral neuropathy and neutropenia, last dose 06/18/2016. She still has intermittent neuropathy to feet and hands in the past month. She is slated for radiation therapy  She states that she is eating well, denies any mucositis. Her hair is starting to grow back.  Outpatient Encounter Prescriptions as of 07/18/2016  Medication Sig  . amLODipine (NORVASC) 5 MG tablet Take 1 tablet (5 mg total) by mouth daily.  . benazepril (LOTENSIN) 40 MG tablet TAKE ONE TABLET BY MOUTH EVERY DAY  . Coenzyme Q10 300 MG CAPS Take 300 capsules by mouth daily.  . cyclobenzaprine (FLEXERIL) 10 MG tablet TAKE ONE TABLET BY MOUTH THREE TIMES DAILY AS NEEDED FOR MUSCLE SPASMS  . DESCOVY 200-25 MG tablet TAKE ONE TABLET BY MOUTH EVERY DAY  . flunisolide (NASALIDE) 25 MCG/ACT (0.025%) SOLN Place 2 sprays into the nose 2 (two) times daily.  [place] Kitchen gabapentin (NEURONTIN) 100 MG capsule Take 1 capsule (100 mg total) by mouth 2 (two) times daily.  . Multiple Vitamin (MULTIVITAMIN WITH MINERALS) TABS tablet Take 1 tablet by mouth daily.  . Omega-3 Fatty Acids (FISH OIL) 1200 MG  CAPS Take 1,200 mg by mouth 2 (two) times daily.    . rosuvastatin (CRESTOR) 5 MG tablet Take 1 tablet (5 mg total) by mouth at bedtime.  . sulfamethoxazole-trimethoprim (BACTRIM,SEPTRA) 400-80 MG tablet TAKE ONE TABLET BY MOUTH EVERY DAY  . TIVICAY 50 MG tablet TAKE ONE TABLET BY MOUTH EVERY DAY   Facility-Administered Encounter Medications as of 07/18/2016  Medication  . sodium chloride flush (NS) 0.9 % injection 10 mL     Patient Active Problem List   Diagnosis Date Noted  . Drug-induced neutropenia ([place]) 04/23/2016  . Malignant neoplasm of upper-outer quadrant of left breast in female, estrogen receptor negative ([place]) 12/11/2015  . Rash and nonspecific skin eruption 10/16/2015  . Hereditary and idiopathic peripheral neuropathy 07/16/2012  . Lumbar neuritis 07/16/2012  . Cervicalgia 07/16/2012  . URI 12/02/2006  . Human immunodeficiency virus (HIV) disease ([place]) 11/08/2005  . HYPERLIPIDEMIA 11/08/2005  . Essential hypertension 11/08/2005  . BRONCHITIS 11/08/2005  . Regional enteritis ([place]) 11/08/2005  . SYMPTOM, PAINFUL RESPIRATION 11/08/2005     Health Maintenance Due  Topic Date Due  . COLONOSCOPY  11/20/2001  . PAP SMEAR  12/17/2010     Review of Systems + neuropathy. 12 points is otherwise negative Physical Exam   BP 131/81   Pulse 94   Temp 97.7 F (36.5 C) (Oral)   Ht '5\' 5"'  (1.651 m)   Wt 143 lb (64.9 kg)   BMI 23.80 kg/m   Physical Exam  Constitutional:  oriented to person, place, and time. appears well-developed and well-nourished. No distress. Wearing a scarf over her head. HENT: [place]/AT, PERRLA, no scleral icterus Mouth/Throat: Oropharynx is clear and moist. No oropharyngeal exudate.  Cardiovascular: Normal rate, regular rhythm and normal heart sounds. Exam reveals no gallop and no friction rub.  No murmur heard.  Pulmonary/Chest: Effort normal and breath sounds normal. No respiratory distress.  has no wheezes.  Neck = supple, no nuchal  rigidity Abdominal: Soft. Bowel sounds are normal.  exhibits no distension. There is no tenderness.  Lymphadenopathy: no cervical adenopathy. No axillary adenopathy Neurological: alert and oriented to person, place, and time.  Skin: Skin is warm and dry. No rash noted. No erythema.  Psychiatric: a normal mood and affect.  behavior is normal.   Lab Results  Component Value Date   CD4TCELL 31 (L) 07/04/2016   Lab Results  Component Value Date   CD4TABS 450 07/04/2016   CD4TABS 210 (L) 03/28/2016   CD4TABS 900 02/01/2016   Lab Results  Component Value Date   HIV1RNAQUANT <20 DETECTED (A) 07/04/2016   Lab Results  Component Value Date   HEPBSAB No 03/24/2006   Lab Results  Component Value Date   LABRPR NON REAC 10/16/2015    CBC Lab Results  Component Value Date   WBC 4.3 07/09/2016   RBC 3.96 07/09/2016   HGB 13.6 07/09/2016   HCT 40.3 07/09/2016   PLT 282 07/09/2016   MCV 101.9 (H) 07/09/2016   MCH 34.3 (H) 07/09/2016   MCHC 33.6 07/09/2016   RDW 16.2 (H) 07/09/2016   LYMPHSABS 1.2 07/09/2016   MONOABS 0.7 07/09/2016   EOSABS 0.1 07/09/2016    BMET Lab Results  Component Value Date   NA 140 07/09/2016   K 3.9 07/09/2016   CL 106 03/28/2016   CO2 29 07/09/2016   GLUCOSE 59 (L) 07/09/2016   BUN 9.4 07/09/2016   CREATININE 0.9 07/09/2016   CALCIUM 10.2 07/09/2016   GFRNONAA 87 03/28/2016   GFRAA >89 03/28/2016      Assessment and Plan  hiv disease = well controlled. Continue on current regimen  hld = needs refill on rosuvastatin  htn = needs refill on benzapril  Muscle aches = will give refill on flexeril  Peripheral neuropathy = cna increase gabapentin from 128m bid to 2028mBID to see if any improvement  Will dispute lab charge

## 2016-07-19 DIAGNOSIS — Z510 Encounter for antineoplastic radiation therapy: Secondary | ICD-10-CM | POA: Diagnosis not present

## 2016-07-25 ENCOUNTER — Ambulatory Visit
Admission: RE | Admit: 2016-07-25 | Discharge: 2016-07-25 | Disposition: A | Discharge status: Home / Self Care | Payer: BLUE CROSS/BLUE SHIELD | Source: Ambulatory Visit | Attending: Radiation Oncology | Admitting: Radiation Oncology

## 2016-07-25 DIAGNOSIS — Z510 Encounter for antineoplastic radiation therapy: Secondary | ICD-10-CM | POA: Diagnosis not present

## 2016-07-29 ENCOUNTER — Ambulatory Visit
Admission: RE | Admit: 2016-07-29 | Discharge: 2016-07-29 | Disposition: A | Discharge status: Home / Self Care | Payer: BLUE CROSS/BLUE SHIELD | Source: Ambulatory Visit | Attending: Radiation Oncology | Admitting: Radiation Oncology

## 2016-07-29 DIAGNOSIS — Z510 Encounter for antineoplastic radiation therapy: Secondary | ICD-10-CM | POA: Diagnosis not present

## 2016-07-30 ENCOUNTER — Ambulatory Visit
Admission: RE | Admit: 2016-07-30 | Discharge: 2016-07-30 | Disposition: A | Discharge status: Home / Self Care | Payer: BLUE CROSS/BLUE SHIELD | Source: Ambulatory Visit | Attending: Radiation Oncology | Admitting: Radiation Oncology

## 2016-07-30 DIAGNOSIS — Z510 Encounter for antineoplastic radiation therapy: Secondary | ICD-10-CM | POA: Diagnosis not present

## 2016-07-30 DIAGNOSIS — C50412 Malignant neoplasm of upper-outer quadrant of left female breast: Secondary | ICD-10-CM

## 2016-07-30 DIAGNOSIS — Z171 Estrogen receptor negative status [ER-]: Principal | ICD-10-CM

## 2016-07-30 MED ORDER — ALRA NON-METALLIC DEODORANT (RAD-ONC)
1.0000 "application " | Freq: Once | TOPICAL | Status: AC
  Administered 2016-07-30: 1 via TOPICAL

## 2016-07-30 MED ORDER — RADIAPLEXRX EX GEL
Freq: Once | CUTANEOUS | Status: AC
  Administered 2016-07-30: 17:00:00 via TOPICAL

## 2016-07-30 NOTE — Progress Notes (Signed)
Pt here for patient teaching.  Pt given Radiation and You booklet, skin care instructions, Alra deodorant and Radiaplex gel.  Reviewed areas of pertinence such as fatigue, skin changes, breast tenderness and breast swelling . Pt able to give teach back of to pat skin and use unscented/gentle soap,apply Radiaplex bid and avoid applying anything to skin within 4 hours of treatment. Pt demonstrated understanding and verbalizes understanding of information given and will contact nursing with any questions or concerns.          

## 2016-08-01 ENCOUNTER — Ambulatory Visit
Admission: RE | Admit: 2016-08-01 | Discharge: 2016-08-01 | Disposition: A | Discharge status: Home / Self Care | Payer: BLUE CROSS/BLUE SHIELD | Source: Ambulatory Visit | Attending: Radiation Oncology | Admitting: Radiation Oncology

## 2016-08-01 DIAGNOSIS — Z510 Encounter for antineoplastic radiation therapy: Secondary | ICD-10-CM | POA: Diagnosis not present

## 2016-08-02 ENCOUNTER — Ambulatory Visit
Admission: RE | Admit: 2016-08-02 | Discharge: 2016-08-02 | Disposition: A | Discharge status: Home / Self Care | Payer: BLUE CROSS/BLUE SHIELD | Source: Ambulatory Visit | Attending: Radiation Oncology | Admitting: Radiation Oncology

## 2016-08-02 DIAGNOSIS — Z510 Encounter for antineoplastic radiation therapy: Secondary | ICD-10-CM | POA: Diagnosis not present

## 2016-08-05 ENCOUNTER — Ambulatory Visit
Admission: RE | Admit: 2016-08-05 | Discharge: 2016-08-05 | Disposition: A | Discharge status: Home / Self Care | Payer: BLUE CROSS/BLUE SHIELD | Source: Ambulatory Visit | Attending: Radiation Oncology | Admitting: Radiation Oncology

## 2016-08-05 DIAGNOSIS — Z510 Encounter for antineoplastic radiation therapy: Secondary | ICD-10-CM | POA: Diagnosis not present

## 2016-08-06 ENCOUNTER — Ambulatory Visit
Admission: RE | Admit: 2016-08-06 | Discharge: 2016-08-06 | Disposition: A | Discharge status: Home / Self Care | Payer: BLUE CROSS/BLUE SHIELD | Source: Ambulatory Visit | Attending: Radiation Oncology | Admitting: Radiation Oncology

## 2016-08-06 DIAGNOSIS — Z510 Encounter for antineoplastic radiation therapy: Secondary | ICD-10-CM | POA: Diagnosis not present

## 2016-08-07 ENCOUNTER — Ambulatory Visit
Admission: RE | Admit: 2016-08-07 | Discharge: 2016-08-07 | Disposition: A | Discharge status: Home / Self Care | Payer: BLUE CROSS/BLUE SHIELD | Source: Ambulatory Visit | Attending: Radiation Oncology | Admitting: Radiation Oncology

## 2016-08-07 DIAGNOSIS — Z510 Encounter for antineoplastic radiation therapy: Secondary | ICD-10-CM | POA: Diagnosis not present

## 2016-08-08 ENCOUNTER — Ambulatory Visit
Admission: RE | Admit: 2016-08-08 | Discharge: 2016-08-08 | Disposition: A | Discharge status: Home / Self Care | Payer: BLUE CROSS/BLUE SHIELD | Source: Ambulatory Visit | Attending: Radiation Oncology | Admitting: Radiation Oncology

## 2016-08-08 DIAGNOSIS — Z510 Encounter for antineoplastic radiation therapy: Secondary | ICD-10-CM | POA: Diagnosis not present

## 2016-08-09 ENCOUNTER — Ambulatory Visit
Admission: RE | Admit: 2016-08-09 | Discharge: 2016-08-09 | Disposition: A | Discharge status: Home / Self Care | Payer: BLUE CROSS/BLUE SHIELD | Source: Ambulatory Visit | Attending: Radiation Oncology | Admitting: Radiation Oncology

## 2016-08-09 DIAGNOSIS — Z510 Encounter for antineoplastic radiation therapy: Secondary | ICD-10-CM | POA: Diagnosis not present

## 2016-08-12 ENCOUNTER — Ambulatory Visit
Admission: RE | Admit: 2016-08-12 | Discharge: 2016-08-12 | Disposition: A | Discharge status: Home / Self Care | Payer: BLUE CROSS/BLUE SHIELD | Source: Ambulatory Visit | Attending: Radiation Oncology | Admitting: Radiation Oncology

## 2016-08-12 DIAGNOSIS — Z510 Encounter for antineoplastic radiation therapy: Secondary | ICD-10-CM | POA: Diagnosis not present

## 2016-08-13 ENCOUNTER — Ambulatory Visit
Admission: RE | Admit: 2016-08-13 | Discharge: 2016-08-13 | Disposition: A | Discharge status: Home / Self Care | Payer: BLUE CROSS/BLUE SHIELD | Source: Ambulatory Visit | Attending: Radiation Oncology | Admitting: Radiation Oncology

## 2016-08-13 DIAGNOSIS — C50412 Malignant neoplasm of upper-outer quadrant of left female breast: Secondary | ICD-10-CM

## 2016-08-13 DIAGNOSIS — Z510 Encounter for antineoplastic radiation therapy: Secondary | ICD-10-CM | POA: Diagnosis not present

## 2016-08-13 DIAGNOSIS — Z171 Estrogen receptor negative status [ER-]: Principal | ICD-10-CM

## 2016-08-13 MED ORDER — RADIAPLEXRX EX GEL
Freq: Once | CUTANEOUS | Status: AC
  Administered 2016-08-13: 11:00:00 via TOPICAL

## 2016-08-14 ENCOUNTER — Ambulatory Visit
Admission: RE | Admit: 2016-08-14 | Discharge: 2016-08-14 | Disposition: A | Discharge status: Home / Self Care | Payer: BLUE CROSS/BLUE SHIELD | Source: Ambulatory Visit | Attending: Radiation Oncology | Admitting: Radiation Oncology

## 2016-08-14 DIAGNOSIS — Z510 Encounter for antineoplastic radiation therapy: Secondary | ICD-10-CM | POA: Diagnosis not present

## 2016-08-15 ENCOUNTER — Ambulatory Visit
Admission: RE | Admit: 2016-08-15 | Discharge: 2016-08-15 | Disposition: A | Discharge status: Home / Self Care | Payer: BLUE CROSS/BLUE SHIELD | Source: Ambulatory Visit | Attending: Radiation Oncology | Admitting: Radiation Oncology

## 2016-08-15 DIAGNOSIS — Z510 Encounter for antineoplastic radiation therapy: Secondary | ICD-10-CM | POA: Diagnosis not present

## 2016-08-16 ENCOUNTER — Ambulatory Visit
Admission: RE | Admit: 2016-08-16 | Discharge: 2016-08-16 | Disposition: A | Discharge status: Home / Self Care | Payer: BLUE CROSS/BLUE SHIELD | Source: Ambulatory Visit | Attending: Radiation Oncology | Admitting: Radiation Oncology

## 2016-08-16 DIAGNOSIS — Z510 Encounter for antineoplastic radiation therapy: Secondary | ICD-10-CM | POA: Diagnosis not present

## 2016-08-19 ENCOUNTER — Ambulatory Visit
Admission: RE | Admit: 2016-08-19 | Discharge: 2016-08-19 | Disposition: A | Discharge status: Home / Self Care | Payer: BLUE CROSS/BLUE SHIELD | Source: Ambulatory Visit | Attending: Radiation Oncology | Admitting: Radiation Oncology

## 2016-08-19 DIAGNOSIS — Z510 Encounter for antineoplastic radiation therapy: Secondary | ICD-10-CM | POA: Diagnosis not present

## 2016-08-20 ENCOUNTER — Ambulatory Visit
Admission: RE | Admit: 2016-08-20 | Discharge: 2016-08-20 | Disposition: A | Discharge status: Home / Self Care | Payer: BLUE CROSS/BLUE SHIELD | Source: Ambulatory Visit | Attending: Radiation Oncology | Admitting: Radiation Oncology

## 2016-08-20 DIAGNOSIS — Z510 Encounter for antineoplastic radiation therapy: Secondary | ICD-10-CM | POA: Diagnosis not present

## 2016-08-21 ENCOUNTER — Ambulatory Visit
Admission: RE | Admit: 2016-08-21 | Discharge: 2016-08-21 | Disposition: A | Discharge status: Home / Self Care | Payer: BLUE CROSS/BLUE SHIELD | Source: Ambulatory Visit | Attending: Radiation Oncology | Admitting: Radiation Oncology

## 2016-08-21 DIAGNOSIS — Z510 Encounter for antineoplastic radiation therapy: Secondary | ICD-10-CM | POA: Diagnosis not present

## 2016-08-22 ENCOUNTER — Ambulatory Visit
Admission: RE | Admit: 2016-08-22 | Discharge: 2016-08-22 | Disposition: A | Discharge status: Home / Self Care | Payer: BLUE CROSS/BLUE SHIELD | Source: Ambulatory Visit | Attending: Radiation Oncology | Admitting: Radiation Oncology

## 2016-08-22 DIAGNOSIS — Z510 Encounter for antineoplastic radiation therapy: Secondary | ICD-10-CM | POA: Diagnosis not present

## 2016-08-23 ENCOUNTER — Ambulatory Visit
Admission: RE | Admit: 2016-08-23 | Discharge: 2016-08-23 | Disposition: A | Discharge status: Home / Self Care | Payer: BLUE CROSS/BLUE SHIELD | Source: Ambulatory Visit | Attending: Radiation Oncology | Admitting: Radiation Oncology

## 2016-08-23 DIAGNOSIS — Z510 Encounter for antineoplastic radiation therapy: Secondary | ICD-10-CM | POA: Diagnosis not present

## 2016-08-26 ENCOUNTER — Ambulatory Visit
Admission: RE | Admit: 2016-08-26 | Discharge: 2016-08-26 | Disposition: A | Discharge status: Home / Self Care | Payer: BLUE CROSS/BLUE SHIELD | Source: Ambulatory Visit | Attending: Radiation Oncology | Admitting: Radiation Oncology

## 2016-08-26 DIAGNOSIS — Z510 Encounter for antineoplastic radiation therapy: Secondary | ICD-10-CM | POA: Diagnosis not present

## 2016-08-27 ENCOUNTER — Ambulatory Visit
Admission: RE | Admit: 2016-08-27 | Discharge: 2016-08-27 | Disposition: A | Discharge status: Home / Self Care | Payer: BLUE CROSS/BLUE SHIELD | Source: Ambulatory Visit | Attending: Radiation Oncology | Admitting: Radiation Oncology

## 2016-08-27 DIAGNOSIS — Z171 Estrogen receptor negative status [ER-]: Principal | ICD-10-CM

## 2016-08-27 DIAGNOSIS — Z510 Encounter for antineoplastic radiation therapy: Secondary | ICD-10-CM | POA: Diagnosis not present

## 2016-08-27 DIAGNOSIS — C50412 Malignant neoplasm of upper-outer quadrant of left female breast: Secondary | ICD-10-CM

## 2016-08-27 MED ORDER — ALRA NON-METALLIC DEODORANT (RAD-ONC)
1.0000 "application " | Freq: Once | TOPICAL | Status: AC
  Administered 2016-08-27: 1 via TOPICAL

## 2016-08-27 MED ORDER — RADIAPLEXRX EX GEL
Freq: Once | CUTANEOUS | Status: AC
  Administered 2016-08-27: 11:00:00 via TOPICAL

## 2016-08-28 ENCOUNTER — Ambulatory Visit
Admission: RE | Admit: 2016-08-28 | Discharge: 2016-08-28 | Disposition: A | Discharge status: Home / Self Care | Payer: BLUE CROSS/BLUE SHIELD | Source: Ambulatory Visit | Attending: Radiation Oncology | Admitting: Radiation Oncology

## 2016-08-28 DIAGNOSIS — Z510 Encounter for antineoplastic radiation therapy: Secondary | ICD-10-CM | POA: Diagnosis not present

## 2016-08-29 ENCOUNTER — Ambulatory Visit
Admission: RE | Admit: 2016-08-29 | Discharge: 2016-08-29 | Disposition: A | Discharge status: Home / Self Care | Payer: BLUE CROSS/BLUE SHIELD | Source: Ambulatory Visit | Attending: Radiation Oncology | Admitting: Radiation Oncology

## 2016-08-29 DIAGNOSIS — Z510 Encounter for antineoplastic radiation therapy: Secondary | ICD-10-CM | POA: Diagnosis not present

## 2016-08-30 ENCOUNTER — Ambulatory Visit
Admission: RE | Admit: 2016-08-30 | Discharge: 2016-08-30 | Disposition: A | Discharge status: Home / Self Care | Payer: BLUE CROSS/BLUE SHIELD | Source: Ambulatory Visit | Attending: Radiation Oncology | Admitting: Radiation Oncology

## 2016-08-30 DIAGNOSIS — Z510 Encounter for antineoplastic radiation therapy: Secondary | ICD-10-CM | POA: Diagnosis not present

## 2016-09-02 ENCOUNTER — Ambulatory Visit
Admission: RE | Admit: 2016-09-02 | Discharge: 2016-09-02 | Disposition: A | Discharge status: Home / Self Care | Payer: BLUE CROSS/BLUE SHIELD | Source: Ambulatory Visit | Attending: Radiation Oncology | Admitting: Radiation Oncology

## 2016-09-02 DIAGNOSIS — Z510 Encounter for antineoplastic radiation therapy: Secondary | ICD-10-CM | POA: Diagnosis not present

## 2016-09-03 ENCOUNTER — Ambulatory Visit
Admission: RE | Admit: 2016-09-03 | Discharge: 2016-09-03 | Disposition: A | Discharge status: Home / Self Care | Payer: BLUE CROSS/BLUE SHIELD | Source: Ambulatory Visit | Attending: Radiation Oncology | Admitting: Radiation Oncology

## 2016-09-03 DIAGNOSIS — C50412 Malignant neoplasm of upper-outer quadrant of left female breast: Secondary | ICD-10-CM

## 2016-09-03 DIAGNOSIS — Z171 Estrogen receptor negative status [ER-]: Principal | ICD-10-CM

## 2016-09-03 DIAGNOSIS — Z510 Encounter for antineoplastic radiation therapy: Secondary | ICD-10-CM | POA: Diagnosis not present

## 2016-09-03 MED ORDER — RADIAPLEXRX EX GEL
Freq: Once | CUTANEOUS | Status: AC
  Administered 2016-09-03: 12:00:00 via TOPICAL

## 2016-09-04 ENCOUNTER — Ambulatory Visit
Admission: RE | Admit: 2016-09-04 | Discharge: 2016-09-04 | Disposition: A | Discharge status: Home / Self Care | Payer: BLUE CROSS/BLUE SHIELD | Source: Ambulatory Visit | Attending: Radiation Oncology | Admitting: Radiation Oncology

## 2016-09-04 DIAGNOSIS — Z510 Encounter for antineoplastic radiation therapy: Secondary | ICD-10-CM | POA: Diagnosis not present

## 2016-09-05 ENCOUNTER — Ambulatory Visit
Admission: RE | Admit: 2016-09-05 | Discharge: 2016-09-05 | Disposition: A | Discharge status: Home / Self Care | Payer: BLUE CROSS/BLUE SHIELD | Source: Ambulatory Visit | Attending: Radiation Oncology | Admitting: Radiation Oncology

## 2016-09-05 DIAGNOSIS — Z510 Encounter for antineoplastic radiation therapy: Secondary | ICD-10-CM | POA: Diagnosis not present

## 2016-09-06 ENCOUNTER — Ambulatory Visit
Admission: RE | Admit: 2016-09-06 | Discharge: 2016-09-06 | Disposition: A | Discharge status: Home / Self Care | Payer: BLUE CROSS/BLUE SHIELD | Source: Ambulatory Visit | Attending: Radiation Oncology | Admitting: Radiation Oncology

## 2016-09-06 DIAGNOSIS — Z510 Encounter for antineoplastic radiation therapy: Secondary | ICD-10-CM | POA: Diagnosis not present

## 2016-09-09 ENCOUNTER — Ambulatory Visit
Admission: RE | Admit: 2016-09-09 | Discharge: 2016-09-09 | Disposition: A | Discharge status: Home / Self Care | Payer: BLUE CROSS/BLUE SHIELD | Source: Ambulatory Visit | Attending: Radiation Oncology | Admitting: Radiation Oncology

## 2016-09-09 DIAGNOSIS — Z510 Encounter for antineoplastic radiation therapy: Secondary | ICD-10-CM | POA: Diagnosis not present

## 2016-09-10 ENCOUNTER — Ambulatory Visit
Admission: RE | Admit: 2016-09-10 | Discharge: 2016-09-10 | Disposition: A | Discharge status: Home / Self Care | Payer: BLUE CROSS/BLUE SHIELD | Source: Ambulatory Visit | Attending: Radiation Oncology | Admitting: Radiation Oncology

## 2016-09-10 DIAGNOSIS — Z171 Estrogen receptor negative status [ER-]: Principal | ICD-10-CM

## 2016-09-10 DIAGNOSIS — Z510 Encounter for antineoplastic radiation therapy: Secondary | ICD-10-CM | POA: Diagnosis not present

## 2016-09-10 DIAGNOSIS — C50412 Malignant neoplasm of upper-outer quadrant of left female breast: Secondary | ICD-10-CM

## 2016-09-10 MED ORDER — RADIAPLEXRX EX GEL
Freq: Once | CUTANEOUS | Status: AC
  Administered 2016-09-10: 14:00:00 via TOPICAL

## 2016-09-11 ENCOUNTER — Ambulatory Visit
Admission: RE | Admit: 2016-09-11 | Discharge: 2016-09-11 | Disposition: A | Discharge status: Home / Self Care | Payer: BLUE CROSS/BLUE SHIELD | Source: Ambulatory Visit | Attending: Radiation Oncology | Admitting: Radiation Oncology

## 2016-09-11 DIAGNOSIS — Z510 Encounter for antineoplastic radiation therapy: Secondary | ICD-10-CM | POA: Diagnosis not present

## 2016-09-12 ENCOUNTER — Ambulatory Visit
Admission: RE | Admit: 2016-09-12 | Discharge: 2016-09-12 | Disposition: A | Discharge status: Home / Self Care | Payer: BLUE CROSS/BLUE SHIELD | Source: Ambulatory Visit | Attending: Radiation Oncology | Admitting: Radiation Oncology

## 2016-09-12 DIAGNOSIS — C50412 Malignant neoplasm of upper-outer quadrant of left female breast: Secondary | ICD-10-CM

## 2016-09-12 DIAGNOSIS — Z510 Encounter for antineoplastic radiation therapy: Secondary | ICD-10-CM | POA: Diagnosis not present

## 2016-09-12 DIAGNOSIS — Z171 Estrogen receptor negative status [ER-]: Principal | ICD-10-CM

## 2016-09-23 ENCOUNTER — Other Ambulatory Visit (HOSPITAL_BASED_OUTPATIENT_CLINIC_OR_DEPARTMENT_OTHER): Payer: BLUE CROSS/BLUE SHIELD

## 2016-09-23 ENCOUNTER — Telehealth: Payer: Self-pay

## 2016-09-23 ENCOUNTER — Ambulatory Visit (HOSPITAL_BASED_OUTPATIENT_CLINIC_OR_DEPARTMENT_OTHER): Payer: BLUE CROSS/BLUE SHIELD | Admitting: Oncology

## 2016-09-23 VITALS — BP 135/77 | HR 102 | Temp 98.3°F | Resp 18 | Ht 65.0 in | Wt 147.8 lb

## 2016-09-23 DIAGNOSIS — Z171 Estrogen receptor negative status [ER-]: Secondary | ICD-10-CM | POA: Diagnosis not present

## 2016-09-23 DIAGNOSIS — Z21 Asymptomatic human immunodeficiency virus [HIV] infection status: Secondary | ICD-10-CM | POA: Diagnosis not present

## 2016-09-23 DIAGNOSIS — C50412 Malignant neoplasm of upper-outer quadrant of left female breast: Secondary | ICD-10-CM

## 2016-09-23 LAB — COMPREHENSIVE METABOLIC PANEL
ALT: 28 U/L (ref 0–55)
ANION GAP: 8 meq/L (ref 3–11)
AST: 34 U/L (ref 5–34)
Albumin: 4.2 g/dL (ref 3.5–5.0)
Alkaline Phosphatase: 83 U/L (ref 40–150)
BUN: 12.5 mg/dL (ref 7.0–26.0)
CHLORIDE: 104 meq/L (ref 98–109)
CO2: 30 meq/L — AB (ref 22–29)
Calcium: 10 mg/dL (ref 8.4–10.4)
Creatinine: 1 mg/dL (ref 0.6–1.1)
EGFR: 58 mL/min/{1.73_m2} — AB (ref >90)
Glucose: 100 mg/dl (ref 70–140)
Potassium: 4.2 mEq/L (ref 3.5–5.1)
Sodium: 142 mEq/L (ref 136–145)
Total Bilirubin: 0.34 mg/dL (ref 0.20–1.20)
Total Protein: 7.4 g/dL (ref 6.4–8.3)

## 2016-09-23 LAB — CBC WITH DIFFERENTIAL/PLATELET
BASO%: 0.4 % (ref 0.0–2.0)
Basophils Absolute: 0 10*3/uL (ref 0.0–0.1)
EOS ABS: 0.1 10*3/uL (ref 0.0–0.5)
EOS%: 2.1 % (ref 0.0–7.0)
HCT: 41.6 % (ref 34.8–46.6)
HGB: 14.4 g/dL (ref 11.6–15.9)
LYMPH%: 16.9 % (ref 14.0–49.7)
MCH: 33.6 pg (ref 25.1–34.0)
MCHC: 34.6 g/dL (ref 31.5–36.0)
MCV: 97 fL (ref 79.5–101.0)
MONO#: 0.6 10*3/uL (ref 0.1–0.9)
MONO%: 8.8 % (ref 0.0–14.0)
NEUT#: 4.8 10*3/uL (ref 1.5–6.5)
NEUT%: 71.8 % (ref 38.4–76.8)
PLATELETS: 219 10*3/uL (ref 145–400)
RBC: 4.29 10*6/uL (ref 3.70–5.45)
RDW: 12.3 % (ref 11.2–14.5)
WBC: 6.7 10*3/uL (ref 3.9–10.3)
lymph#: 1.1 10*3/uL (ref 0.9–3.3)

## 2016-09-23 NOTE — Progress Notes (Signed)
[facility]  [phone] Fax:([phone]) [phone]     ID: [name] DOB: 10/17/1950  MR#: [id number]  BTD#:[id number]  Patient Care Team: Carlyle Basques, MD as PCP - General (Infectious Diseases) Carlyle Basques, MD as PCP - Infectious Diseases (Infectious Diseases) Alphonsa Overall, MD as Consulting Physician (General Surgery) Jhada Risk, Virgie Dad, MD as Consulting Physician (Oncology) Gery Pray, MD as Consulting Physician (Radiation Oncology) Marcial Pacas, MD as Consulting Physician (Neurology) Druscilla Brownie, MD as Consulting Physician (Dermatology) Delice Bison, Charlestine Massed, NP as Nurse Practitioner (Hematology and Oncology) Chauncey Cruel, MD OTHER MD:  CHIEF COMPLAINT: Triple negative breast cancer  CURRENT TREATMENT: Observation   BREAST CANCER HISTORY:  From the origil intake note:  [name] herself palpated a mass in the upper outer quadrant of her left breast the first week in November, and had bilateral diagnostic mammography with tomography and left breast ultrasonography at the [name] 12/05/2015. The breast density was category C. In the upper outer left breast there was  A mass with macro lobulated contours which was barely palpable at the 12:30 o'clock position of the left breast 7 cm from the nipple. There was no palpable axillary adenopathy. Ultrasonography confirmed a 2.1 cm obliquely oriented hypoechoic mass in the area in question. Ultrasound of the left axilla was benign.  Biopsy of the left breast mass in question 12/07/2015 showed ([id number]) invasive ductal carcinoma, grade 3, estrogen and progesterone receptor negative, with an MIB-1 of 35%, and no HER-2 amplification, the signals ratio being 0.95 and the number per cell 2.80.  Her subsequent history is as detailed below  INTERVAL HISTORY: [name] of her estrogen receptor negative breast cancer accompanied by her son. Since her last visit here  she completed her radiation treatments. She did well with them as she feels. She did have some desquamation and some fatigue. Those problems are resolving.  She is already scheduled to have her port removed the third week in September. She is scheduled to see Dr. Graylon Good September 24 and 2 get updated with her dentist in October. In short she is beginning to resume her normal schedule     REVIEW OF SYSTEMS: The peripheral neuropathy she developed from chemotherapy has largely resolved. She still has some numbness and tingling in the first and second digits of her left hand only. She has some residual nail changes. [name] feels "cold" all the time, whereas before she used to be on the "hot side". This may be due to decreased activity but she tells me she is doing yard work and her housework which is pretty much but she usually does. Her hair is coming in slowly but it is coming in. A detailed review of systems today was otherwise stable  PAST MEDICAL HISTORY: Past Medical History:  Diagnosis Date  . HIV (human immunodeficiency virus infection) ([facility])   . Hypertension   . Malignant neoplasm of upper-outer quadrant of left female breast ([facility]) 12/11/2015  . Migraine   . Neuropathy   . PONV (postoperative nausea and vomiting)    said usually has to have scop patch  . Rash and nonspecific skin eruption 10/16/2015  . Spinal stenosis     PAST SURGICAL HISTORY: Past Surgical History:  Procedure Laterality Date  . ABDOMINAL HYSTERECTOMY  1990  . BREAST LUMPECTOMY WITH RADIOACTIVE SEED AND SENTINEL LYMPH NODE BIOPSY Left 01/01/2016   Procedure: LEFT BREAST LUMPECTOMY WITH RADIOACTIVE SEED AND SENTINEL LYMPH NODE BIOPSY;  Surgeon: Alphonsa Overall, MD;  Location:  [facility];  Service: General;  Laterality: Left;  . CARPAL TUNNEL RELEASE     both hands  . CERVICAL FUSION  1989  . PORTACATH PLACEMENT Right 01/01/2016   Procedure: INSERTION PORT-A-CATH WITH [name];  Surgeon: Alphonsa Overall, MD;   Location: [facility];  Service: General;  Laterality: Right;  . TONSILLECTOMY      FAMILY HISTORY Family History  Problem Relation Age of Onset  . High blood pressure Mother   . High Cholesterol Mother   . Cancer Father   . High blood pressure Father   . High Cholesterol Father   . Stomach cancer Father        intestinal  . Lung cancer Maternal Grandfather   . Breast cancer Maternal Aunt 21  The patient's father died from heart disease at the age of 88. The patient's mother died from "genetic cirrhosis" at the age of 66. The patient had 2 brothers, 5 sisters. A maternal aunt was diagnosed with breast cancer at age 76. There is no other history of breast or ovarian cancer in the family to the patient's knowledge   GYNECOLOGIC HISTORY:  No LMP recorded. Patient has had a hysterectomy. Menarche age 35, first live birth age 50. Patient is GX P1. She underwent hysterectomy with bilateral salpingo-oophorectomy at age 67. She took hormone replacement for approximately 10 years, until age 70. She also had oral contraceptives remotely for 8-10 years, with no complications.   SOCIAL HISTORY:  [place] worked as a Theme park manager, but now [employer] for living. At home is just she and her husband [name] who is retired from Teaching laboratory technician work. Their son [name] works as a Chief Strategy Officer for rest home in [place]. The patient has 3 grandchildren. She is not a Ambulance person.   ADVANCED DIRECTIVES: The patient has a living will in place   HEALTH MAINTENANCE: Social History  Substance Use Topics  . Smoking status: Never Smoker  . Smokeless tobacco: Never Used  . Alcohol use 0.6 oz/week    1 Glasses of wine per week     Comment: on weekends     Colonoscopy: 2012/be routinely  PAP: Status post hysterectomy  Bone density: Never   No Known Allergies  Current Outpatient Prescriptions  Medication Sig Dispense Refill  . amLODipine (NORVASC) 5 MG tablet Take 1 tablet (5 mg total) by mouth  daily. 30 tablet 5  . benazepril (LOTENSIN) 40 MG tablet Take 1 tablet (40 mg total) by mouth daily. 30 tablet 5  . Coenzyme Q10 300 MG CAPS Take 300 capsules by mouth daily.    . cyclobenzaprine (FLEXERIL) 10 MG tablet TAKE ONE TABLET BY MOUTH THREE TIMES DAILY AS NEEDED FOR MUSCLE SPASMS 90 tablet 5  . DESCOVY 200-25 MG tablet TAKE ONE TABLET BY MOUTH EVERY DAY 30 tablet 5  . flunisolide (NASALIDE) 25 MCG/ACT (0.025%) SOLN Place 2 sprays into the nose 2 (two) times daily. 1 Bottle 3  . gabapentin (NEURONTIN) 100 MG capsule Take 1 capsule (100 mg total) by mouth 2 (two) times daily. 180 capsule 1  . hyaluronate sodium (RADIAPLEXRX) GEL Apply 1 application topically once.    . Multiple Vitamin (MULTIVITAMIN WITH MINERALS) TABS tablet Take 1 tablet by mouth daily.    . non-metallic deodorant Jethro Poling) MISC Apply 1 application topically daily as needed.    . Omega-3 Fatty Acids (FISH OIL) 1200 MG CAPS Take 1,200 mg by mouth 2 (two) times daily.      . rosuvastatin (CRESTOR) 5  MG tablet Take 1 tablet (5 mg total) by mouth at bedtime. 30 tablet 5  . TIVICAY 50 MG tablet TAKE ONE TABLET BY MOUTH EVERY DAY 30 tablet 5   No current facility-administered medications for this visit.    Facility-Administered Medications Ordered in Other Visits  Medication Dose Route Frequency Provider Last Rate Last Dose  . sodium chloride flush (NS) 0.9 % injection 10 mL  10 mL Intravenous PRN Felica Chargois, Virgie Dad, MD   10 mL at 05/21/16 1022    OBJECTIVE:Middle-aged white woman In no acute distress  Vitals:   09/23/16 1402  BP: 135/77  Pulse: (!) 102  Resp: 18  Temp: 98.3 F (36.8 C)  SpO2: 97%     Body mass index is 24.6 kg/m.    ECOG FS:1 - Symptomatic but completely ambulatory   Sclerae unicteric, EOMs intact Oropharynx clear and moist No cervical or supraclavicular adenopathy Lungs no rales or rhonchi Heart regular rate and rhythm Abd soft, nontender, positive bowel sounds MSK no focal spinal  tenderness, no upper extremity lymphedema Neuro: nonfocal, well oriented, appropriate affect Breasts: The right breast is unremarkable. The left breast is status post lumpectomy and radiation. There is some dried was commission particularly in the axilla. There is mild erythema and skin thickening as expected. Both axillae are benign  LAB RESULTS:  CMP     Component Value Date/Time   NA 140 07/09/2016 0915   K 3.9 07/09/2016 0915   CL 106 03/28/2016 1139   CO2 29 07/09/2016 0915   GLUCOSE 59 (L) 07/09/2016 0915   BUN 9.4 07/09/2016 0915   CREATININE 0.9 07/09/2016 0915   CALCIUM 10.2 07/09/2016 0915   PROT 7.3 07/09/2016 0915   ALBUMIN 4.5 07/09/2016 0915   AST 32 07/09/2016 0915   ALT 31 07/09/2016 0915   ALKPHOS 91 07/09/2016 0915   BILITOT 0.44 07/09/2016 0915   GFRNONAA 87 03/28/2016 1139   GFRAA >89 03/28/2016 1139    INo results found for: SPEP, UPEP  Lab Results  Component Value Date   WBC 6.7 09/23/2016   NEUTROABS 4.8 09/23/2016   HGB 14.4 09/23/2016   HCT 41.6 09/23/2016   MCV 97.0 09/23/2016   PLT 219 09/23/2016      Chemistry      Component Value Date/Time   NA 140 07/09/2016 0915   K 3.9 07/09/2016 0915   CL 106 03/28/2016 1139   CO2 29 07/09/2016 0915   BUN 9.4 07/09/2016 0915   CREATININE 0.9 07/09/2016 0915      Component Value Date/Time   CALCIUM 10.2 07/09/2016 0915   ALKPHOS 91 07/09/2016 0915   AST 32 07/09/2016 0915   ALT 31 07/09/2016 0915   BILITOT 0.44 07/09/2016 0915       No results found for: LABCA2  No components found for: LABCA125  No results for input(s): INR in the last 168 hours.  Urinalysis    Component Value Date/Time   COLORURINE YELLOW 03/21/2007 1108   APPEARANCEUR CLEAR 03/21/2007 1108   LABSPEC 1.021 03/21/2007 1108   PHURINE 7.0 03/21/2007 1108   GLUCOSEU 100 (A) 03/21/2007 1108   HGBUR NEGATIVE 03/21/2007 1108   BILIRUBINUR NEGATIVE 03/21/2007 1108   KETONESUR NEGATIVE 03/21/2007 1108   PROTEINUR  NEGATIVE 03/21/2007 1108   UROBILINOGEN 1.0 03/21/2007 1108   NITRITE NEGATIVE 03/21/2007 1108   LEUKOCYTESUR SMALL (A) 03/21/2007 1108     STUDIES: Chest x-ray 06/27/2016 found no active disease  ELIGIBLE FOR AVAILABLE RESEARCH PROTOCOL: no  ASSESSMENT: 66 y.o. [place] woman status post left breast upper outer quadrant biopsy 12/07/2015 for a clinical T2 N0, stage IIA invasive ductal carcinoma, grade 3, triple negative, with an MIB-1 of 35%  (1) genetics testing 01/23/2016  (a) alpha -1- anti trypsin and ceruloplamin labs drawn 01/19/2016, both normal  (2) Status postf Left lumpectomy/ sentinel lymph node sampling 01/01/2016 for a pT1c pN0, stage IA  Invasive ductal carcinoma, grade 3, with negative margins.  (3) Mammaprint sent from the patient's 12/07/2015 biopsy returned "high risk", predicting a risk of recurrence of nearly 30% untreated, decreasing to a 93% chance of five-year distant metastasis free survival with chemotherapy.  (4) adjuvant chemotherapy consisting of cyclophosphamide and doxorubicin in dose dense fashion 4 started 02/01/2016, completed 03/12/2016, followed by weekly Abraxane 12 starting 04/02/2016,  (a) changed on 04/30/16 to Abraxane days 1,8,15, with Neulasta support on day 16 of each 28 day cycle,   (b) then changed on 4/17 to Abraxane days 1, 8, on a 21 day cycle with Neulasta on day 9 due to neutropenia  (c) taxanes stopped after 9 doses due to neutropenia, last dose 06/18/2016  (5) adjuvant radiation 07/29/2016-09/12/2016 Site/dose:   1.) Breast, Left 1.8 Gy in 28 fractions                         2.) Breast, Left Boost 2 Gy in 5 fractions   (6) HIV positivity: Followed by Dr. Graylon Good. June 2018 labs show a T4 abs 450, percentage 31%, HIV RNA undetectable  PLAN: Carrigan has completed all treatment for her breast cancer, and she is reentering normalcy. She still has some neuropathy in the first 2 digits of her left hand. Luckily she is right-handed. This  might not be related to the chemotherapy so much as to carpal tunnel and if it persists in the absence of any other neuropathy she might benefit from evaluation by an orthopedic hand surgeon.  Her most recent HIV labs are excellent. She is very compliant and fully deserves the good results  She understands she is in remission, which is what people mean when they say they are "cancer free". She has been in remission since her surgery. She is really scheduled for port removal and she will reestablished routine [name] with Dr. Graylon Good in September.  I am going to see her in December. That will be the first anniversary of her surgery. I am setting her up for mammography just before that visit. If Dr. Lucia Gaskins sees her in June, then her M.D. visits will be optimally spaced  I reassured her nails will eventually fully grow back as before.  She knows to call for any problems that may develop before her next visit here.   Chauncey Cruel, MD   09/23/2016 2:10 PM Medical Oncology and Hematology [facility] [address] [place], [place] [address] Tel. [phone]    Fax. [phone]

## 2016-09-23 NOTE — Telephone Encounter (Signed)
appts made and avs printed for patient 

## 2016-10-01 ENCOUNTER — Ambulatory Visit: Payer: BLUE CROSS/BLUE SHIELD | Admitting: Nurse Practitioner

## 2016-10-02 ENCOUNTER — Other Ambulatory Visit: Payer: Self-pay | Admitting: Nurse Practitioner

## 2016-10-08 ENCOUNTER — Other Ambulatory Visit: Payer: Self-pay | Admitting: Licensed Clinical Social Worker

## 2016-10-08 ENCOUNTER — Other Ambulatory Visit: Payer: BLUE CROSS/BLUE SHIELD

## 2016-10-08 ENCOUNTER — Ambulatory Visit (INDEPENDENT_AMBULATORY_CARE_PROVIDER_SITE_OTHER): Payer: BLUE CROSS/BLUE SHIELD | Admitting: Pharmacist

## 2016-10-08 DIAGNOSIS — B20 Human immunodeficiency virus [HIV] disease: Secondary | ICD-10-CM

## 2016-10-08 MED ORDER — BICTEGRAVIR-EMTRICITAB-TENOFOV 50-200-25 MG PO TABS: 1.0000 | ORAL_TABLET | Freq: Every day | ORAL | 6 refills | Status: DC

## 2016-10-08 NOTE — Progress Notes (Signed)
HPI: [name] is a 66 y.o. female who presents to the [facility] clinic today to follow-up for her HIV infection.   Allergies: No Known Allergies  Past Medical History: Past Medical History:  Diagnosis Date  . HIV (human immunodeficiency virus infection) ([facility])   . Hypertension   . Malignant neoplasm of upper-outer quadrant of left female breast ([facility]) 12/11/2015  . Migraine   . Neuropathy   . PONV (postoperative nausea and vomiting)    said usually has to have scop patch  . Rash and nonspecific skin eruption 10/16/2015  . Spinal stenosis     Social History: Social History   Social History  . Marital status: Married    Spouse name: [name]  . Number of children: 1  . Years of education: 50   Occupational History  . Retired    Social History Main Topics  . Smoking status: Never Smoker  . Smokeless tobacco: Never Used  . Alcohol use 0.6 oz/week    1 Glasses of wine per week     Comment: on weekends  . Drug use: No  . Sexual activity: Yes     Comment: declined condoms   Other Topics Concern  . Not on file   Social History Narrative   Patient lives at home with her husband [name]). 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   Caffeine - None    Right handed.      Current Regimen: Tivicay + Descovy  Labs: HIV 1 RNA Quant (copies/mL)  Date Value  07/04/2016 <20 DETECTED (A)  03/28/2016 <20 DETECTED (A)  02/01/2016 <20   CD4 T Cell Abs (/uL)  Date Value  07/04/2016 450  03/28/2016 210 (L)  02/01/2016 900   Hep B S Ab (no units)  Date Value  03/24/2006 No   Hepatitis B Surface Ag (no units)  Date Value  03/24/2006 No   HCV Ab (no units)  Date Value  03/24/2006 No    CrCl: CrCl cannot be calculated (Unknown ideal weight.).  Lipids:    Component Value Date/Time   CHOL 199 11/09/2015 1005   TRIG 135 11/09/2015 1005   HDL 73 11/09/2015 1005   CHOLHDL 2.7 11/09/2015 1005   VLDL 27 11/09/2015 1005   LDLCALC 99 11/09/2015  1005    Assessment: [name] is here today to follow-up for her HIV infection.  She is getting new insurance starting in October and wanted [name] to be aware of it.  She currently has BCBS of [place] and is getting her Tivicay and Descovy through [employer] in [place] and has copay assistance through the manufacturers. She will go on Medicare in October.  She presented her Medicare card to [name] and is asking about help with paying for her HIV medications since copay cards do not work on [employer].  [name] was able to get her funding through PAF.    She is also interested in switching back to a single tablet regimen since she has successfully completed her chemotherapy.  She was switched from [place] to [place] while she was on paclitaxel, doxorubicin, and cyclophosphamide for her breast cancer.  She has completed therapy.  We discussed going back on [place] or switching to [place] since it is smaller, does not require food, and she has tolerated Tivicay and Descovy well.  She would like to try the [place].  I sent it to [place]. Hopefully her BCBS will cover it. She is aware of the plan  and will have $0 copay with the PAF assistance.  She sees Dr. Baxter Flattery in a few weeks.  Plans: - Stop Tivicay and Descovy - Start Biktarvy PO once daily - F/u with Dr. Baxter Flattery 9/24 at 245pm  Cassie L. Kuppelweiser, PharmD, [facility] for Infectious Disease 10/08/2016, 10:42 AM

## 2016-10-10 LAB — HIV-1 RNA QUANT-NO REFLEX-BLD
HIV 1 RNA QUANT: DETECTED {copies}/mL — AB
HIV-1 RNA QUANT, LOG: DETECTED {Log_copies}/mL — AB

## 2016-10-10 LAB — T-HELPER CELL (CD4) - (RCID CLINIC ONLY)
CD4 % Helper T Cell: 28 % — ABNORMAL LOW (ref 33–55)
CD4 T CELL ABS: 420 /uL (ref 400–2700)

## 2016-10-21 ENCOUNTER — Ambulatory Visit (INDEPENDENT_AMBULATORY_CARE_PROVIDER_SITE_OTHER): Payer: BLUE CROSS/BLUE SHIELD | Admitting: Internal Medicine

## 2016-10-21 ENCOUNTER — Encounter: Payer: Self-pay | Admitting: Radiation Oncology

## 2016-10-21 ENCOUNTER — Ambulatory Visit: Payer: BLUE CROSS/BLUE SHIELD

## 2016-10-21 ENCOUNTER — Ambulatory Visit
Admission: RE | Admit: 2016-10-21 | Discharge: 2016-10-21 | Disposition: A | Discharge status: Home / Self Care | Payer: BLUE CROSS/BLUE SHIELD | Source: Ambulatory Visit | Attending: Radiation Oncology | Admitting: Radiation Oncology

## 2016-10-21 ENCOUNTER — Encounter: Payer: Self-pay | Admitting: Internal Medicine

## 2016-10-21 VITALS — BP 144/84 | HR 98 | Temp 98.2°F | Wt 150.0 lb

## 2016-10-21 DIAGNOSIS — G620 Drug-induced polyneuropathy: Secondary | ICD-10-CM | POA: Diagnosis not present

## 2016-10-21 DIAGNOSIS — Z171 Estrogen receptor negative status [ER-]: Secondary | ICD-10-CM | POA: Diagnosis present

## 2016-10-21 DIAGNOSIS — T451X5A Adverse effect of antineoplastic and immunosuppressive drugs, initial encounter: Secondary | ICD-10-CM | POA: Diagnosis not present

## 2016-10-21 DIAGNOSIS — Z9889 Other specified postprocedural states: Secondary | ICD-10-CM | POA: Diagnosis not present

## 2016-10-21 DIAGNOSIS — Z79899 Other long term (current) drug therapy: Secondary | ICD-10-CM | POA: Insufficient documentation

## 2016-10-21 DIAGNOSIS — B20 Human immunodeficiency virus [HIV] disease: Secondary | ICD-10-CM

## 2016-10-21 DIAGNOSIS — I10 Essential (primary) hypertension: Secondary | ICD-10-CM

## 2016-10-21 DIAGNOSIS — C50412 Malignant neoplasm of upper-outer quadrant of left female breast: Secondary | ICD-10-CM

## 2016-10-21 DIAGNOSIS — Z9221 Personal history of antineoplastic chemotherapy: Secondary | ICD-10-CM | POA: Diagnosis not present

## 2016-10-21 DIAGNOSIS — Z7982 Long term (current) use of aspirin: Secondary | ICD-10-CM | POA: Insufficient documentation

## 2016-10-21 NOTE — Progress Notes (Signed)
Radiation Oncology         [phone] ________________________________  Name: [name] MRN: [id number]  Date: 10/21/2016  DOB: 12-29-1950  Follow-Up Visit Note  CC: [name] Basques, MD  Magrinat, Virgie Dad, MD    ICD-10-CM   1. Malignant neoplasm of upper-outer quadrant of left breast in female, estrogen receptor negative ([facility]) C50.412    Z17.1     Diagnosis:   Clinical Stage IIA (T2 N0)LeftBreast UOQ Invasive Ductal Carcinoma, ER-/ PR-/ Her2-, Grade 3, post left breast lumpectomy and adjuvant chemotherapy  Interval Since Last Radiation:  07/29/2016-09/12/2016           Left breast: 50.4 Gy in 28 fractions           Boost: 10 gy in 5 fractions  Narrative:  The patient returns today for routine follow-up. Pt reports that she is doing well overall and slowly regaining her energy at this time. She notes that she stopped using radiaplex recently. Pt has a follow up appointment with Dr. Jana Hakim on 12/31/2016. She denies being on any additional medications prescribed by Dr. Jana Hakim.    On review of systems, pt reports a non-pruritic, red, swollen area of concern to her left breast. Pt denies cough or SOB.                      ALLERGIES:  has No Known Allergies.  Meds: Current Outpatient Prescriptions  Medication Sig Dispense Refill  . amLODipine (NORVASC) 5 MG tablet Take 1 tablet (5 mg total) by mouth daily. 30 tablet 5  . aspirin EC 81 MG tablet Take 81 mg by mouth daily.    . benazepril (LOTENSIN) 40 MG tablet Take 1 tablet (40 mg total) by mouth daily. 30 tablet 5  . Coenzyme Q10 300 MG CAPS Take 300 capsules by mouth daily.    . cyclobenzaprine (FLEXERIL) 10 MG tablet TAKE ONE TABLET BY MOUTH THREE TIMES DAILY AS NEEDED FOR MUSCLE SPASMS 90 tablet 5  . DESCOVY 200-25 MG tablet Take 1 tablet by mouth daily.  5  . gabapentin (NEURONTIN) 100 MG capsule Take 1 capsule (100 mg total) by mouth 2 (two) times daily. 180 capsule 1  . Multiple Vitamin (MULTIVITAMIN WITH  MINERALS) TABS tablet Take 1 tablet by mouth daily.    . non-metallic deodorant Jethro Poling) MISC Apply 1 application topically daily as needed.    . Omega-3 Fatty Acids (FISH OIL) 1200 MG CAPS Take 1,200 mg by mouth 2 (two) times daily.      . rosuvastatin (CRESTOR) 5 MG tablet Take 1 tablet (5 mg total) by mouth at bedtime. 30 tablet 5  . TIVICAY 50 MG tablet Take 50 mg by mouth daily.  5  . bictegravir-emtricitabine-tenofovir AF (BIKTARVY) 50-200-25 MG TABS tablet Take 1 tablet by mouth daily. (Patient not taking: Reported on 10/21/2016) 30 tablet 6  . flunisolide (NASALIDE) 25 MCG/ACT (0.025%) SOLN Place 2 sprays into the nose 2 (two) times daily. 1 Bottle 3   No current facility-administered medications for this encounter.    Facility-Administered Medications Ordered in Other Encounters  Medication Dose Route Frequency Provider Last Rate Last Dose  . sodium chloride flush (NS) 0.9 % injection 10 mL  10 mL Intravenous PRN Magrinat, Virgie Dad, MD   10 mL at 05/21/16 1022    Physical Findings: The patient is in no acute distress. Patient is alert and oriented.  height is '5\' 5"'  (1.651 m) and weight is 151 lb 3.2 oz (  68.6 kg). Her oral temperature is 98 F (36.7 C). Her blood pressure is 130/80 and her pulse is 100. Her oxygen saturation is 99%. .  No significant changes. Lungs are clear to auscultation bilaterally. Heart has regular rate and rhythm. No palpable cervical, supraclavicular, or axillary adenopathy. Abdomen soft, non-tender, normal bowel sounds. Right breast with no palpable mass, nipple discharge, or bleeding. Left breast with mild erythema and edema in the breast. No palpable mass, nipple discharge, or bleeding. Pt has a small erythematous area adjacent to the areolar border at approximately the 3 o'clock position.   Lab Findings: Lab Results  Component Value Date   WBC 6.7 09/23/2016   HGB 14.4 09/23/2016   HCT 41.6 09/23/2016   MCV 97.0 09/23/2016   PLT 219 09/23/2016     Radiographic Findings: No results found.  Impression:  The patient is recovering from the effects of radiation. Patient has questionable early infection in the skin of left breast and will place triple abx ointment on the area for the next several days and return for f/u in 2 weeks. Pt will call if the area worsens on topical abx.   Plan:  Return in 2 weeks for follow up  ____________________________________ -----------------------------------  Blair Promise, PhD, MD   This document serves as a record of services personally performed by Gery Pray, MD. It was created on her behalf by Steva Colder, a trained medical scribe. The creation of this record is based on the scribe's personal observations and the provider's statements to them. This document has been checked and approved by the attending provider.

## 2016-10-21 NOTE — Progress Notes (Signed)
RFV: follow up for hiv disease Patient ID: [name], female   DOB: [birth year], 66 y.o.   MRN: [id number]  HPI [name] is a 66yo F with well controlled long standing hiv disease, but also recently finished treatment for breast cancer in the last 12 months. She was switched to biktarvy to PAP due to change in insurance. On 9/11. In regards to her breast ca :Clinical Stage IIA (T2 N0)LeftBreast UOQ Invasive Ductal Carcinoma, ER-/ PR-/ Her2-, Grade 3, post left breast lumpectomy and adjuvant chemotherapy with radiation for which she completed. She reports feeling like she has improved energy. Has mild neuropathy to her first and second finger of left hand. She is accompanied by her son, they have concerns regarding her upcoming switch in insurance and how that may affect her out of pocket costs, secondly she is trying to troubleshoot a bill for lab tests .  Outpatient Encounter Prescriptions as of 10/21/2016  Medication Sig  . amLODipine (NORVASC) 5 MG tablet Take 1 tablet (5 mg total) by mouth daily.  Marland Kitchen aspirin EC 81 MG tablet Take 81 mg by mouth daily.  . benazepril (LOTENSIN) 40 MG tablet Take 1 tablet (40 mg total) by mouth daily.  . bictegravir-emtricitabine-tenofovir AF (BIKTARVY) 50-200-25 MG TABS tablet Take 1 tablet by mouth daily. (Patient not taking: Reported on 10/21/2016)  . Coenzyme Q10 300 MG CAPS Take 300 capsules by mouth daily.  . cyclobenzaprine (FLEXERIL) 10 MG tablet TAKE ONE TABLET BY MOUTH THREE TIMES DAILY AS NEEDED FOR MUSCLE SPASMS  . DESCOVY 200-25 MG tablet Take 1 tablet by mouth daily.  . flunisolide (NASALIDE) 25 MCG/ACT (0.025%) SOLN Place 2 sprays into the nose 2 (two) times daily. (Patient not taking: Reported on 10/21/2016)  . gabapentin (NEURONTIN) 100 MG capsule Take 1 capsule (100 mg total) by mouth 2 (two) times daily.  . Multiple Vitamin (MULTIVITAMIN WITH MINERALS) TABS tablet Take 1 tablet by mouth daily.  . non-metallic deodorant [name]) MISC Apply  1 application topically daily as needed.  . Omega-3 Fatty Acids (FISH OIL) 1200 MG CAPS Take 1,200 mg by mouth 2 (two) times daily.    . rosuvastatin (CRESTOR) 5 MG tablet Take 1 tablet (5 mg total) by mouth at bedtime.  Marland Kitchen TIVICAY 50 MG tablet Take 50 mg by mouth daily.   Facility-Administered Encounter Medications as of 10/21/2016  Medication  . sodium chloride flush (NS) 0.9 % injection 10 mL     Patient Active Problem List   Diagnosis Date Noted  . Drug-induced neutropenia ([place]) 04/23/2016  . Malignant neoplasm of upper-outer quadrant of left breast in female, estrogen receptor negative ([place]) 12/11/2015  . Rash and nonspecific skin eruption 10/16/2015  . Hereditary and idiopathic peripheral neuropathy 07/16/2012  . Lumbar neuritis 07/16/2012  . Cervicalgia 07/16/2012  . URI 12/02/2006  . Human immunodeficiency virus (HIV) disease ([place]) 11/08/2005  . HYPERLIPIDEMIA 11/08/2005  . Essential hypertension 11/08/2005  . BRONCHITIS 11/08/2005  . Regional enteritis ([place]) 11/08/2005  . SYMPTOM, PAINFUL RESPIRATION 11/08/2005     Health Maintenance Due  Topic Date Due  . COLONOSCOPY  11/20/2001  . PAP SMEAR  12/17/2010  . INFLUENZA VACCINE  08/28/2016     Review of Systems Review of Systems  Constitutional: Negative for fever, chills, diaphoresis, activity change, appetite change, fatigue and unexpected weight change.  HENT: Negative for congestion, sore throat, rhinorrhea, sneezing, trouble swallowing and sinus pressure.  Eyes: Negative for photophobia and visual disturbance.  Respiratory: Negative for cough, chest tightness,  shortness of breath, wheezing and stridor.  Cardiovascular: Negative for chest pain, palpitations and leg swelling.  Gastrointestinal: Negative for nausea, vomiting, abdominal pain, diarrhea, constipation, blood in stool, abdominal distention and anal bleeding.  Genitourinary: Negative for dysuria, hematuria, flank pain and difficulty urinating.    Musculoskeletal: Negative for myalgias, back pain, joint swelling, arthralgias and gait problem.  Skin: Negative for color change, pallor, rash and wound.  Neurological: Negative for dizziness, tremors, weakness and light-headedness.  Hematological: Negative for adenopathy. Does not bruise/bleed easily.  Psychiatric/Behavioral: Negative for behavioral problems, confusion, sleep disturbance, dysphoric mood, decreased concentration and agitation.    Physical Exam  Blood pressure (!) 144/84, pulse 98, temperature 98.2 F (36.8 C), temperature source Oral, weight 150 lb (68 kg). Physical Exam  Constitutional:  oriented to person, place, and time. appears well-developed and well-nourished. No distress. Hair growing back grey HENT: [place]/AT, PERRLA, no scleral icterus Mouth/Throat: Oropharynx is clear and moist. No oropharyngeal exudate.  Cardiovascular: Normal rate, regular rhythm and normal heart sounds. Exam reveals no gallop and no friction rub.  No murmur heard.  Pulmonary/Chest: Effort normal and breath sounds normal. No respiratory distress.  has no wheezes.  Neck = supple, no nuchal rigidity Abdominal: Soft. Bowel sounds are normal.  exhibits no distension. There is no tenderness.  Lymphadenopathy: no cervical adenopathy. No axillary adenopathy Neurological: alert and oriented to person, place, and time.  Skin: Skin is warm and dry. No rash noted. No erythema.  Psychiatric: a normal mood and affect.  behavior is normal.    Lab Results  Component Value Date   CD4TCELL 28 (L) 10/08/2016   Lab Results  Component Value Date   CD4TABS 420 10/08/2016   CD4TABS 450 07/04/2016   CD4TABS 210 (L) 03/28/2016   Lab Results  Component Value Date   HIV1RNAQUANT <20 DETECTED (A) 10/08/2016   Lab Results  Component Value Date   HEPBSAB No 03/24/2006   Lab Results  Component Value Date   LABRPR NON REAC 10/16/2015    CBC Lab Results  Component Value Date   WBC 6.7 09/23/2016   RBC  4.29 09/23/2016   HGB 14.4 09/23/2016   HCT 41.6 09/23/2016   PLT 219 09/23/2016   MCV 97.0 09/23/2016   MCH 33.6 09/23/2016   MCHC 34.6 09/23/2016   RDW 12.3 09/23/2016   LYMPHSABS 1.1 09/23/2016   MONOABS 0.6 09/23/2016   EOSABS 0.1 09/23/2016    BMET Lab Results  Component Value Date   NA 142 09/23/2016   K 4.2 09/23/2016   CL 106 03/28/2016   CO2 30 (H) 09/23/2016   GLUCOSE 100 09/23/2016   BUN 12.5 09/23/2016   CREATININE 1.0 09/23/2016   CALCIUM 10.0 09/23/2016   GFRNONAA 87 03/28/2016   GFRAA >89 03/28/2016      Assessment and Plan  Hypertension = elevated due to anxiety during appt. We will not change meds at this time. Watch at next visit. Her BP was normal range at her early MD appt  hiv disease = well controlled. Cd 4 count in 400s likely as result from chemo  Health maintenance = will get flu shot in 2 wk  Peripheral neuropathy 2/2 chemo = appears improving. Continue to monitor  Insurance/bills = will have advocate, betty, come speak with patient. Lab bill sent to clinic manager to troubleshoot

## 2016-10-21 NOTE — Progress Notes (Signed)
[name] is here for follow up after treatment to her left breast.  She denies having any pain.  She did notice a small, red raised area above her left nipple.  She said she first noticed it yesterday and it is not painful.  She reports her energy level is improving.  The skin on her left breast has hyperpigmentation.    BP 130/80 (BP Location: Right Arm, Patient Position: Sitting)   Pulse 100   Temp 98 F (36.7 C) (Oral)   Ht 5\' 5"  (1.651 m)   Wt 151 lb 3.2 oz (68.6 kg)   SpO2 99%   BMI 25.16 kg/m    Wt Readings from Last 3 Encounters:  10/21/16 151 lb 3.2 oz (68.6 kg)  09/23/16 147 lb 12.8 oz (67 kg)  07/18/16 143 lb (64.9 kg)

## 2016-10-21 NOTE — Progress Notes (Signed)
  Radiation Oncology         [phone] ________________________________  Name: [name] MRN: [id number]  Date: 10/21/2016  DOB: Nov 08, 1950  End of Treatment Note  Diagnosis:   Clinical Stage IIA (T2 N0)LeftBreast UOQ Invasive Ductal Carcinoma, ER-/ PR-/ Her2-, Grade 3, post left breast lumpectomy and adjuvant chemotherapy     Indication for treatment:  Curative       Radiation treatment dates:   07/29/2016-09/12/2016  Site/dose:   1. Left breast, 1.8 Gy in 28 fractions           2. Boost, 2 Gy in 5 fractions  Beams/energy:   1. 3D, 6X         2. Electron, 12E  Narrative: The patient tolerated radiation treatment relatively well. At the beginning of treatment, pt had neuropathy that she was treating with Rx Gabapentin. She denied pain or fatigue at that time. At the end of radiotherapy treatment, she noted radiation related skin changes to the left breast consistent of erythema and hyperpigmentation. Pt was advised to use triple abx ointment to the area. She also noted an area of dermatitis underneath her left breast. Pt was advised to use hydrocortisone cream PRN to this area.   Plan: The patient has completed radiation treatment. The patient will return to radiation oncology clinic for routine followup in one month. I advised them to call or return sooner if they have any questions or concerns related to their recovery or treatment.  -----------------------------------  Blair Promise, PhD, MD  This document serves as a record of services personally performed by Gery Pray, MD. It was created on her behalf by Steva Colder, a trained medical scribe. The creation of this record is based on the scribe's personal observations and the provider's statements to them. This document has been checked and approved by the attending provider.

## 2016-10-22 ENCOUNTER — Ambulatory Visit: Payer: BLUE CROSS/BLUE SHIELD | Admitting: Internal Medicine

## 2016-10-28 DIAGNOSIS — Z9221 Personal history of antineoplastic chemotherapy: Secondary | ICD-10-CM | POA: Diagnosis not present

## 2016-10-28 DIAGNOSIS — Z9889 Other specified postprocedural states: Secondary | ICD-10-CM | POA: Diagnosis not present

## 2016-10-28 DIAGNOSIS — Z171 Estrogen receptor negative status [ER-]: Secondary | ICD-10-CM | POA: Diagnosis present

## 2016-10-28 DIAGNOSIS — C50412 Malignant neoplasm of upper-outer quadrant of left female breast: Secondary | ICD-10-CM | POA: Diagnosis present

## 2016-10-28 DIAGNOSIS — Z79899 Other long term (current) drug therapy: Secondary | ICD-10-CM | POA: Diagnosis not present

## 2016-10-28 DIAGNOSIS — Z7982 Long term (current) use of aspirin: Secondary | ICD-10-CM | POA: Diagnosis not present

## 2016-10-29 ENCOUNTER — Encounter: Payer: Self-pay | Admitting: Radiation Oncology

## 2016-10-29 ENCOUNTER — Ambulatory Visit
Admission: RE | Admit: 2016-10-29 | Discharge: 2016-10-29 | Disposition: A | Discharge status: Home / Self Care | Payer: BLUE CROSS/BLUE SHIELD | Source: Ambulatory Visit | Attending: Radiation Oncology | Admitting: Radiation Oncology

## 2016-10-29 ENCOUNTER — Telehealth: Payer: Self-pay | Admitting: Oncology

## 2016-10-29 DIAGNOSIS — C50412 Malignant neoplasm of upper-outer quadrant of left female breast: Secondary | ICD-10-CM

## 2016-10-29 DIAGNOSIS — Z171 Estrogen receptor negative status [ER-]: Principal | ICD-10-CM

## 2016-10-29 MED ORDER — DOXYCYCLINE HYCLATE 100 MG PO TABS: 100.0000 mg | ORAL_TABLET | Freq: Two times a day (BID) | ORAL | 0 refills | Status: DC

## 2016-10-29 NOTE — Progress Notes (Signed)
Radiation Oncology         [phone] ________________________________  Name: [name] MRN: [id number]  Date: 10/29/2016  DOB: 1950/04/05  Follow-Up Visit Note  CC: Carlyle Basques, MD  Carlyle Basques, MD    ICD-10-CM   1. Malignant neoplasm of upper-outer quadrant of left breast in female, estrogen receptor negative ([place]) C50.412    Z17.1     Diagnosis:   Clinical Stage IIA (T2 N0)LeftBreast UOQ Invasive Ductal Carcinoma, ER-/ PR-/ Her2-, Grade 3, post left breast lumpectomy and adjuvant chemotherapy  Interval Since Last Radiation:  6 weeks    07/29/2016-09/12/2016                                                      Left breast: 50.4 Gy in 28 fractions                                                       Boost: 10 gy in 5 fractions  Narrative:  The patient returns today for further evaluation. She was seen several days ago and was noted to have a area of erythema in the left breast and was advised to place a triple antibiotic ointment on this area. Patient has not noticed any improvement in her erythema and is noticed some swelling in the breast and more discomfort with movement of the left arm. She denies any obvious chills or fever                           ALLERGIES:  has No Known Allergies.  Meds: Current Outpatient Prescriptions  Medication Sig Dispense Refill  . amLODipine (NORVASC) 5 MG tablet Take 1 tablet (5 mg total) by mouth daily. 30 tablet 5  . aspirin EC 81 MG tablet Take 81 mg by mouth daily.    . benazepril (LOTENSIN) 40 MG tablet Take 1 tablet (40 mg total) by mouth daily. 30 tablet 5  . Coenzyme Q10 300 MG CAPS Take 300 capsules by mouth daily.    . cyclobenzaprine (FLEXERIL) 10 MG tablet TAKE ONE TABLET BY MOUTH THREE TIMES DAILY AS NEEDED FOR MUSCLE SPASMS 90 tablet 5  . DESCOVY 200-25 MG tablet Take 1 tablet by mouth daily.  5  . flunisolide (NASALIDE) 25 MCG/ACT (0.025%) SOLN Place 2 sprays into the nose 2 (two) times daily. 1 Bottle 3  .  gabapentin (NEURONTIN) 100 MG capsule Take 1 capsule (100 mg total) by mouth 2 (two) times daily. 180 capsule 1  . Multiple Vitamin (MULTIVITAMIN WITH MINERALS) TABS tablet Take 1 tablet by mouth daily.    . non-metallic deodorant Jethro Poling) MISC Apply 1 application topically daily as needed.    . Omega-3 Fatty Acids (FISH OIL) 1200 MG CAPS Take 1,200 mg by mouth 2 (two) times daily.      . rosuvastatin (CRESTOR) 5 MG tablet Take 1 tablet (5 mg total) by mouth at bedtime. 30 tablet 5  . TIVICAY 50 MG tablet Take 50 mg by mouth daily.  5  . bictegravir-emtricitabine-tenofovir AF (BIKTARVY) 50-200-25 MG TABS tablet Take 1 tablet by mouth daily. (Patient not taking: Reported on 10/21/2016)  30 tablet 6  . doxycycline (VIBRA-TABS) 100 MG tablet Take 1 tablet (100 mg total) by mouth 2 (two) times daily. 20 tablet 0   No current facility-administered medications for this encounter.    Facility-Administered Medications Ordered in Other Encounters  Medication Dose Route Frequency Provider Last Rate Last Dose  . sodium chloride flush (NS) 0.9 % injection 10 mL  10 mL Intravenous PRN Magrinat, Virgie Dad, MD   10 mL at 05/21/16 1022    Physical Findings: The patient is in no acute distress. Patient is alert and oriented.  height is '5\' 5"'  (1.651 m) and weight is 149 lb 9.6 oz (67.9 kg). Her oral temperature is 97.8 F (36.6 C). Her blood pressure is 154/89 (abnormal) and her pulse is 90. Her oxygen saturation is 100%. .  The lungs are clear. The heart has a regular rhythm and rate. Examination left breast reveals mild diffuse erythema throughout central portion of the breast. The erythematous scabbed area with a red halo around it has not improved. The patient has some tenderness with palpation in the upper outer breast and axillary region. No obvious seroma  Lab Findings: Lab Results  Component Value Date   WBC 6.7 09/23/2016   HGB 14.4 09/23/2016   HCT 41.6 09/23/2016   MCV 97.0 09/23/2016   PLT 219  09/23/2016    Radiographic Findings: No results found.  Impression:  Probable cellulitis of the left breast. The patient will be placed on a 10 day course of doxycycline.  Plan:  Follow-up in 2 weeks to ensure clearing of her infection.  ____________________________________ Gery Pray, MD

## 2016-10-29 NOTE — Telephone Encounter (Signed)
Patient called and said the area on her left breast is still red.  She said she has been applying triple antibiotic ointment as directed.  She said the area is the same size.  She said the rest of her breast is now tender to the touch.  She denies having a fever.  Follow up appointment arranged for today at 10 am.

## 2016-10-29 NOTE — Progress Notes (Addendum)
[name] is here for follow up.  She reports that the red area on her left breast is still red and about the same size.  She is using triple antibiotic ointment 3 times a day. She noticed yesterday that her left breast is tender to the touch and she is having trouble/pain with raising her left arm.  The skin on her left breast has hyperpigmentation.  There is a small circular scabbed area with a red halo around it underneath her left breast.  BP (!) 154/89 (BP Location: Right Arm, Patient Position: Sitting)   Pulse 90   Temp 97.8 F (36.6 C) (Oral)   Ht 5\' 5"  (1.651 m)   Wt 144 lb 6.4 oz (65.5 kg)   SpO2 100%   BMI 24.03 kg/m    Wt Readings from Last 3 Encounters:  10/29/16 149 lb 9.6 oz (67.9 kg)  10/21/16 151 lb 3.2 oz (68.6 kg)  10/21/16 150 lb (68 kg)

## 2016-11-04 ENCOUNTER — Ambulatory Visit: Payer: BLUE CROSS/BLUE SHIELD | Admitting: Radiation Oncology

## 2016-11-05 ENCOUNTER — Telehealth: Payer: Self-pay | Admitting: Nurse Practitioner

## 2016-11-05 DIAGNOSIS — M62838 Other muscle spasm: Secondary | ICD-10-CM

## 2016-11-05 NOTE — Telephone Encounter (Signed)
[facility] [phone] called inquiring if PA was rec'd for cyclobenzaprine (FLEXERIL) 10 MG tablet . Please call

## 2016-11-05 NOTE — Telephone Encounter (Signed)
Yes received.

## 2016-11-05 NOTE — Telephone Encounter (Signed)
Prior auth for cyclobenzaprine sent to [facility]. We should have a determination within 3-5 business days.

## 2016-11-06 NOTE — Telephone Encounter (Signed)
[facility] [phone] called PA was denied. Diagnosis is not FDA approved.

## 2016-11-07 NOTE — Telephone Encounter (Signed)
I LMVM for pt to return call.  GoodRx is $11.32 for 90 tabs $15.76 for 270 at [facility], [facility] $33.1710mg  cyclobenzaprine.

## 2016-11-11 ENCOUNTER — Ambulatory Visit
Admission: RE | Admit: 2016-11-11 | Discharge: 2016-11-11 | Disposition: A | Discharge status: Home / Self Care | Payer: BLUE CROSS/BLUE SHIELD | Source: Ambulatory Visit | Attending: Radiation Oncology | Admitting: Radiation Oncology

## 2016-11-11 DIAGNOSIS — Z171 Estrogen receptor negative status [ER-]: Principal | ICD-10-CM

## 2016-11-11 DIAGNOSIS — C50412 Malignant neoplasm of upper-outer quadrant of left female breast: Secondary | ICD-10-CM

## 2016-11-11 NOTE — Progress Notes (Signed)
Radiation Oncology         [phone] ________________________________  Name: HAFSAH HENDLER MRN: [id number]  Date: 11/11/2016  DOB: 1950/08/11  Follow-Up Visit Note  CC: Carlyle Basques, MD  Alphonsa Overall, MD    ICD-10-CM   1. Malignant neoplasm of upper-outer quadrant of left breast in female, estrogen receptor negative ([place]) C50.412    Z17.1     Diagnosis:   Clinical Stage IIA (T2 N0)LeftBreast UOQ Invasive Ductal Carcinoma, ER-/ PR-/ Her2-, Grade 3, post left breast lumpectomy and adjuvant chemotherapy  Interval Since Last Radiation:  6 weeks    07/29/2016-09/12/2016                   Left breast: 50.4 Gy in 28 fractions Boost: 10 gy in 5 fractions  Narrative:  The patient returns today for further evaluation. She reports ongoing and continued pain to the left breast and axilla with raising her arm. She was placed on a course of Doxycycline at her last visit for probably cellulitic changes over the breast; this has improved her redness somewhat but she reports that the burning has somewhat worsened along the axilla and breast since beginning this. She also notes some tenderness upon palpation over the area as well. Otherwise, she is doing well and without other symptoms/complaints.    ALLERGIES:  has No Known Allergies.  Meds: Current Outpatient Prescriptions  Medication Sig Dispense Refill  . amLODipine (NORVASC) 5 MG tablet Take 1 tablet (5 mg total) by mouth daily. 30 tablet 5  . aspirin EC 81 MG tablet Take 81 mg by mouth daily.    . benazepril (LOTENSIN) 40 MG tablet Take 1 tablet (40 mg total) by mouth daily. 30 tablet 5  . Coenzyme Q10 300 MG CAPS Take 300 capsules by mouth daily.    . cyclobenzaprine (FLEXERIL) 10 MG tablet TAKE ONE TABLET BY MOUTH THREE TIMES DAILY AS NEEDED FOR MUSCLE SPASMS 90 tablet 5  . flunisolide (NASALIDE) 25 MCG/ACT (0.025%) SOLN Place 2 sprays into the nose 2 (two) times daily. 1 Bottle 3  . gabapentin (NEURONTIN) 100 MG capsule  Take 1 capsule (100 mg total) by mouth 2 (two) times daily. 180 capsule 1  . Multiple Vitamin (MULTIVITAMIN WITH MINERALS) TABS tablet Take 1 tablet by mouth daily.    . non-metallic deodorant Jethro Poling) MISC Apply 1 application topically daily as needed.    . Omega-3 Fatty Acids (FISH OIL) 1200 MG CAPS Take 1,200 mg by mouth 2 (two) times daily.      . rosuvastatin (CRESTOR) 5 MG tablet Take 1 tablet (5 mg total) by mouth at bedtime. 30 tablet 5  . bictegravir-emtricitabine-tenofovir AF (BIKTARVY) 50-200-25 MG TABS tablet Take 1 tablet by mouth daily. (Patient not taking: Reported on 11/11/2016) 30 tablet 6  . DESCOVY 200-25 MG tablet Take 1 tablet by mouth daily.  5  . doxycycline (VIBRA-TABS) 100 MG tablet Take 1 tablet (100 mg total) by mouth 2 (two) times daily. (Patient not taking: Reported on 11/11/2016) 20 tablet 0  . TIVICAY 50 MG tablet Take 50 mg by mouth daily.  5   No current facility-administered medications for this encounter.    Facility-Administered Medications Ordered in Other Encounters  Medication Dose Route Frequency Provider Last Rate Last Dose  . sodium chloride flush (NS) 0.9 % injection 10 mL  10 mL Intravenous PRN Magrinat, Virgie Dad, MD   10 mL at 05/21/16 1022    Physical Findings: The patient is in no acute  distress. Patient is alert and oriented.  height is '5\' 5"'  (1.651 m) and weight is 145 lb 12.8 oz (66.1 kg). Her oral temperature is 98 F (36.7 C). Her blood pressure is 151/95 (abnormal) and her pulse is 108 (abnormal). Her oxygen saturation is 98%. .  The lungs are clear. The heart has a regular rhythm and rate. Previously examination left breast reveals mild diffuse erythema throughout central portion of the breast.  improved. Today, the patient's left breast has little erythema. Still TTP over the lateral aspect, but no obvious seroma or residual signs of infection.   Lab Findings: Lab Results  Component Value Date   WBC 6.7 09/23/2016   HGB 14.4 09/23/2016     HCT 41.6 09/23/2016   MCV 97.0 09/23/2016   PLT 219 09/23/2016    Radiographic Findings: No results found.  Impression: Pt's skin has improved after being on antibiotics. She continues to c/o a burning sensation along skin, breast, and inside breast area but does not feel she needs to take medication for this issue.   Plan: The pt will return for close f/u in a month. She was urged to call us for f/u sooner if she begins to develop new or worsening symptoms.   ____________________________________ -----------------------------------  Blair Promise, PhD, MD  This document serves as a record of services personally performed by Gery Pray, MD. It was created on his behalf by Reola Mosher, a trained medical scribe. The creation of this record is based on the scribe's personal observations and the provider's statements to them. This document has been checked and approved by the attending provider.

## 2016-11-11 NOTE — Telephone Encounter (Signed)
error 

## 2016-11-11 NOTE — Progress Notes (Signed)
[name] is here for follow up.  She continues to have pain raising her left arm and has soreness under her arm down to under her breast.  She is wondering if this is normal and if she can start doing her arm exercises again. She finished taking Doxycyline on Thursday.  The red area on her left breast appears smaller and less raised.  She also reports having more burning in her left breast while taking doxycycline.  She reports feeling fatigued.  BP (!) 151/95 (BP Location: Right Arm, Patient Position: Sitting)   Pulse (!) 108   Temp 98 F (36.7 C) (Oral)   Ht 5\' 5"  (1.651 m)   Wt 145 lb 12.8 oz (66.1 kg)   SpO2 98%   BMI 24.26 kg/m    Wt Readings from Last 3 Encounters:  11/11/16 145 lb 12.8 oz (66.1 kg)  10/29/16 149 lb 9.6 oz (67.9 kg)  10/21/16 151 lb 3.2 oz (68.6 kg)

## 2016-11-11 NOTE — Telephone Encounter (Signed)
LMVM for pt to return call.   

## 2016-11-15 NOTE — Telephone Encounter (Signed)
yes

## 2016-11-15 NOTE — Telephone Encounter (Signed)
This pt has been on the drug since 03-21-2009 per Dr. Morene Antu (at [facility]).  This is for vascular and tension headache.  Not approved per FDA for Cervicalgia, Headaches.

## 2016-11-18 ENCOUNTER — Ambulatory Visit: Payer: BLUE CROSS/BLUE SHIELD

## 2016-11-19 ENCOUNTER — Encounter (HOSPITAL_COMMUNITY): Payer: Self-pay

## 2016-11-19 ENCOUNTER — Encounter: Payer: Self-pay | Admitting: *Deleted

## 2016-11-19 ENCOUNTER — Telehealth (HOSPITAL_COMMUNITY): Payer: Self-pay | Admitting: *Deleted

## 2016-11-19 ENCOUNTER — Ambulatory Visit (HOSPITAL_COMMUNITY)
Admission: RE | Admit: 2016-11-19 | Discharge: 2016-11-19 | Disposition: A | Discharge status: Home / Self Care | Payer: Medicare Other | Source: Ambulatory Visit | Attending: Cardiology | Admitting: Cardiology

## 2016-11-19 DIAGNOSIS — I252 Old myocardial infarction: Secondary | ICD-10-CM | POA: Insufficient documentation

## 2016-11-19 DIAGNOSIS — R000 Tachycardia, unspecified: Secondary | ICD-10-CM | POA: Insufficient documentation

## 2016-11-19 MED ORDER — METOPROLOL TARTRATE 25 MG PO TABS: 12.5000 mg | ORAL_TABLET | Freq: Two times a day (BID) | ORAL | 3 refills | Status: DC

## 2016-11-19 MED ORDER — METOPROLOL TARTRATE 25 MG PO TABS: 12.5000 mg | ORAL_TABLET | Freq: Two times a day (BID) | ORAL | 0 refills | Status: DC

## 2016-11-19 NOTE — Patient Instructions (Addendum)
START taking metoprolol 12.5 mg (0.5 Tablet) Twice Daily  Follow up on November 5th with echocardiogram.

## 2016-11-19 NOTE — Telephone Encounter (Signed)
Advanced Heart Failure Triage Encounter  Patient Name: [name]  Date of Call: 11/19/16  Problem: Irregular HR  Patient called complaining of her HR being high at 100 and feels that its irregular.  BP was 144/100 this morning prior to taking her medications.  She feels that it could be anxiety but asking if she could come in and have it checked.   Plan:  I advised her to go ahead and take her medications and offered her a nurse visit this afternoon to come have vitals checked and EKG.  Patient accepted and has been added for nurse visit. Patient was due for a 2 month follow up in August, will have her schedule a follow up with Dr. Haroldine Laws before leaving today.   Darron Doom, RN

## 2016-11-22 NOTE — Telephone Encounter (Signed)
LMVM for [place]ais, [employer], that these are the other diagnosis for pt.  M54.2 Cervicalgia, M54.16 Lumbar Neuritis, G60.9 PN.  I will also fax last ofv note. To [phone].   Fax confirmed.

## 2016-11-22 NOTE — Telephone Encounter (Signed)
Fax confirmation received from [place] at [facility] (letter for appeal on flexeril).  This has a 7 day turnaround.  If any additional information can be faxed to same # [phone],

## 2016-11-22 NOTE — Telephone Encounter (Signed)
[facility] [phone] (can LVM) called does pt have other dx than tension HA's. Please call

## 2016-11-25 ENCOUNTER — Ambulatory Visit (INDEPENDENT_AMBULATORY_CARE_PROVIDER_SITE_OTHER): Payer: Medicare Other | Admitting: *Deleted

## 2016-11-25 DIAGNOSIS — Z23 Encounter for immunization: Secondary | ICD-10-CM | POA: Diagnosis not present

## 2016-11-25 DIAGNOSIS — M62838 Other muscle spasm: Secondary | ICD-10-CM | POA: Insufficient documentation

## 2016-11-25 NOTE — Telephone Encounter (Addendum)
I spoke to [place] and relayed that will be adding muscle spasms to diagnosis.  She will forward to clinical for review.

## 2016-11-25 NOTE — Telephone Encounter (Signed)
Th[facility] [phone] called does the pt have dx of muscle spasm? Please call to discuss

## 2016-11-25 NOTE — Telephone Encounter (Signed)
She already carries a diagnosis of neck pain, you can add  Spasms if you want

## 2016-11-25 NOTE — Telephone Encounter (Signed)
I looked in first note with [facility] (Dr. Erling Cruz back in 03/21/2009 when initially prescribed).  Notes sharp pain " spasm".  (base of skull L side of head (occipital area).  Can we place diagnosis of muscle spasms?

## 2016-11-25 NOTE — Assessment & Plan Note (Signed)
Pt started on 03-21-2009 for spasms in neck, causing headaches.

## 2016-11-26 NOTE — Telephone Encounter (Signed)
Thais from [facility] called to inform that there is now an approval for the cyclobenzaprine (FLEXERIL) 10 MG tablet, she said she is faxing over the approval as well

## 2016-11-26 NOTE — Telephone Encounter (Addendum)
I called and spoke to [facility] at [place] and relayed that approval for flexeril was given thru her insurance.  They will [name] next refill.  (pt paid $4.00) for this recently.  I relayed this to pt as well.

## 2016-11-27 NOTE — Telephone Encounter (Signed)
  [place] appeal approved 11-05-16 to 11-05-2017 [facility] MCR PPO [id number] flexeril for muscle spasms in neck. [phone]. sy

## 2016-12-02 ENCOUNTER — Ambulatory Visit (HOSPITAL_COMMUNITY)
Admission: RE | Admit: 2016-12-02 | Discharge: 2016-12-02 | Disposition: A | Discharge status: Home / Self Care | Payer: Medicare Other | Source: Ambulatory Visit | Attending: Internal Medicine | Admitting: Internal Medicine

## 2016-12-02 ENCOUNTER — Encounter (HOSPITAL_COMMUNITY): Payer: Self-pay | Admitting: Internal Medicine

## 2016-12-02 ENCOUNTER — Ambulatory Visit (HOSPITAL_BASED_OUTPATIENT_CLINIC_OR_DEPARTMENT_OTHER)
Admission: RE | Admit: 2016-12-02 | Discharge: 2016-12-02 | Disposition: A | Discharge status: Home / Self Care | Payer: Medicare Other | Source: Ambulatory Visit | Attending: Internal Medicine | Admitting: Internal Medicine

## 2016-12-02 VITALS — BP 152/98 | Wt 149.0 lb

## 2016-12-02 DIAGNOSIS — Z7982 Long term (current) use of aspirin: Secondary | ICD-10-CM | POA: Diagnosis not present

## 2016-12-02 DIAGNOSIS — G629 Polyneuropathy, unspecified: Secondary | ICD-10-CM | POA: Diagnosis not present

## 2016-12-02 DIAGNOSIS — I10 Essential (primary) hypertension: Secondary | ICD-10-CM

## 2016-12-02 DIAGNOSIS — Z79899 Other long term (current) drug therapy: Secondary | ICD-10-CM | POA: Insufficient documentation

## 2016-12-02 DIAGNOSIS — R072 Precordial pain: Secondary | ICD-10-CM

## 2016-12-02 DIAGNOSIS — Z8249 Family history of ischemic heart disease and other diseases of the circulatory system: Secondary | ICD-10-CM | POA: Insufficient documentation

## 2016-12-02 DIAGNOSIS — R0789 Other chest pain: Secondary | ICD-10-CM | POA: Insufficient documentation

## 2016-12-02 DIAGNOSIS — Z803 Family history of malignant neoplasm of breast: Secondary | ICD-10-CM | POA: Diagnosis not present

## 2016-12-02 DIAGNOSIS — Z171 Estrogen receptor negative status [ER-]: Secondary | ICD-10-CM | POA: Insufficient documentation

## 2016-12-02 DIAGNOSIS — Z21 Asymptomatic human immunodeficiency virus [HIV] infection status: Secondary | ICD-10-CM | POA: Diagnosis not present

## 2016-12-02 DIAGNOSIS — Z923 Personal history of irradiation: Secondary | ICD-10-CM | POA: Diagnosis not present

## 2016-12-02 DIAGNOSIS — C50412 Malignant neoplasm of upper-outer quadrant of left female breast: Secondary | ICD-10-CM | POA: Diagnosis not present

## 2016-12-02 DIAGNOSIS — I509 Heart failure, unspecified: Secondary | ICD-10-CM | POA: Diagnosis not present

## 2016-12-02 DIAGNOSIS — E785 Hyperlipidemia, unspecified: Secondary | ICD-10-CM | POA: Insufficient documentation

## 2016-12-02 DIAGNOSIS — Z800 Family history of malignant neoplasm of digestive organs: Secondary | ICD-10-CM | POA: Diagnosis not present

## 2016-12-02 DIAGNOSIS — R002 Palpitations: Secondary | ICD-10-CM

## 2016-12-02 DIAGNOSIS — I110 Hypertensive heart disease with heart failure: Secondary | ICD-10-CM | POA: Insufficient documentation

## 2016-12-02 DIAGNOSIS — Z9221 Personal history of antineoplastic chemotherapy: Secondary | ICD-10-CM | POA: Diagnosis not present

## 2016-12-02 MED ORDER — AMLODIPINE BESYLATE 10 MG PO TABS: 10.0000 mg | ORAL_TABLET | Freq: Every day | ORAL | 3 refills | Status: DC

## 2016-12-02 NOTE — Patient Instructions (Signed)
Your provider requests you have a 48 hour holter monitor.  Your provider requests you have an exercise treadmill test.   Follow up with Dr.Bensimhon as needed.

## 2016-12-02 NOTE — Addendum Note (Signed)
Encounter addended by: Harvie Junior, CMA on: 12/02/2016 12:04 PM  Actions taken: Pharmacy for encounter modified, Diagnosis association updated, Order list changed, Sign clinical note

## 2016-12-02 NOTE — Progress Notes (Signed)
  Echocardiogram 2D Echocardiogram has been performed.  [name] 12/02/2016, 10:51 AM

## 2016-12-02 NOTE — Addendum Note (Signed)
Encounter addended by: Harvie Junior, CMA on: 12/02/2016 12:30 PM  Actions taken: Order list changed, Diagnosis association updated

## 2016-12-02 NOTE — Progress Notes (Signed)
Cardio-Oncology Clinic Note   Referring Physician: Dr. Jana Hakim Primary Cardiologist: Dr. Haroldine Laws   HPI: Ms. [name] is a 66 year old female with a past medical history of breast CA (doxorubicin x 4 started on 02/01/16 and completed 03/12/16). Also with history of HTN and HIV. She is referred by Dr. Jana Hakim for further evaluation of CP.   She had an Echo in Dec. 2017 that showed an EF 60-65%, no wall motion abnormalities. This was prior to her treatment for breast CA.   Summary of breast CA treatment:   Diagnosed 11/17 - clinical T2 N0, stage IIA invasive ductal carcinoma, grade 3, triple negative, with an MIB-1 of 35%  (1) Status post Left lumpectomy/ sentinel lymph node sampling 01/01/2016 for a pT1c pN0, stage IA  Invasive ductal carcinoma, grade 3, with negative margins.  (2) adjuvant chemotherapy consisting of cyclophosphamide and doxorubicin in dose dense fashion 4 started 02/01/2016, completed 03/12/2016, followed by weekly Abraxane 12 starting 04/02/2016,             (a) changed on 04/30/16 to Abraxane days 1,8,15, with Neulasta support on day 16 of each 28 day cycle,              (b) then changed on 4/17 to Abraxane days 1, 8, on a 21 day cycle with Neulasta on day 9 due to neutropenia             (c) taxanes stopped after 9 doses due to neutropenia, last dose 06/18/2016  (3) adjuvant radiation completed 8/18 Her for follow up. Completed XRT in 8/18. Feeling ok. At last visit was c/o chest pressure. We scheduled a Myoview but CP resolved and she cancelled. Says she still gets CP when she bends over. Remains very active without exertional CP. Occasionally has skipped beats. No HF symptoms.   ECHO today EF 60-65% No RWMA Personally reviewed   Past Medical History:  Diagnosis Date  . HIV (human immunodeficiency virus infection) ([place])   . Hypertension   . Malignant neoplasm of upper-outer quadrant of left female breast ([place]) 12/11/2015  . Migraine   . Neuropathy   .  PONV (postoperative nausea and vomiting)    said usually has to have scop patch  . Rash and nonspecific skin eruption 10/16/2015  . Spinal stenosis     Current Outpatient Medications  Medication Sig Dispense Refill  . amLODipine (NORVASC) 5 MG tablet Take 1 tablet (5 mg total) by mouth daily. 30 tablet 5  . aspirin EC 81 MG tablet Take 81 mg by mouth daily.    . benazepril (LOTENSIN) 40 MG tablet Take 1 tablet (40 mg total) by mouth daily. 30 tablet 5  . bictegravir-emtricitabine-tenofovir AF (BIKTARVY) 50-200-25 MG TABS tablet Take 1 tablet by mouth daily. 30 tablet 6  . Biotin 5000 MCG CAPS Take 5,000 mcg daily by mouth.    . Coenzyme Q10 300 MG CAPS Take 300 capsules by mouth daily.    . cyclobenzaprine (FLEXERIL) 10 MG tablet TAKE ONE TABLET BY MOUTH THREE TIMES DAILY AS NEEDED FOR MUSCLE SPASMS (Patient taking differently: Take 10 mg daily as needed by mouth. TAKE ONE TABLET BY MOUTH THREE TIMES DAILY AS NEEDED FOR MUSCLE SPASMS) 90 tablet 5  . fexofenadine (ALLEGRA) 180 MG tablet Take 180 mg daily by mouth.    . gabapentin (NEURONTIN) 100 MG capsule Take 1 capsule (100 mg total) by mouth 2 (two) times daily. (Patient taking differently: Take 100 mg daily by mouth. ) 180 capsule  1  . metoprolol tartrate (LOPRESSOR) 25 MG tablet Take 0.5 tablets (12.5 mg total) by mouth 2 (two) times daily. 30 tablet 3  . Multiple Vitamin (MULTIVITAMIN WITH MINERALS) TABS tablet Take 1 tablet by mouth daily.    . Omega-3 Fatty Acids (FISH OIL) 1200 MG CAPS Take 1,200 mg by mouth 2 (two) times daily.      . rosuvastatin (CRESTOR) 5 MG tablet Take 1 tablet (5 mg total) by mouth at bedtime. 30 tablet 5   No current facility-administered medications for this encounter.    Facility-Administered Medications Ordered in Other Encounters  Medication Dose Route Frequency Provider Last Rate Last Dose  . sodium chloride flush (NS) 0.9 % injection 10 mL  10 mL Intravenous PRN Magrinat, Virgie Dad, MD   10 mL at  05/21/16 1022    No Known Allergies    Social History   Socioeconomic History  . Marital status: Widowed    Spouse name: [name]  . Number of children: 1  . Years of education: 47  . Highest education level: Not on file  Social Needs  . Financial resource strain: Not on file  . Food insecurity - worry: Not on file  . Food insecurity - inability: Not on file  . Transportation needs - medical: Not on file  . Transportation needs - non-medical: Not on file  Occupational History  . Occupation: Retired  Tobacco Use  . Smoking status: Never Smoker  . Smokeless tobacco: Never Used  Substance and Sexual Activity  . Alcohol use: Yes    Alcohol/week: 0.6 oz    Types: 1 Glasses of wine per week    Comment: on weekends  . Drug use: No  . Sexual activity: Not Currently    Partners: Male    Comment: declined condoms; husband passed 05/2016  Other Topics Concern  . Not on file  Social History Narrative   Patient lives at home with her husband [name]). 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   Caffeine - None    Right handed.        Family History  Problem Relation Age of Onset  . High blood pressure Mother   . High Cholesterol Mother   . Cancer Father   . High blood pressure Father   . High Cholesterol Father   . Stomach cancer Father        intestinal  . Lung cancer Maternal Grandfather   . Breast cancer Maternal Aunt 38    Vitals:   12/02/16 1109  BP: (!) 152/98  Weight: 149 lb (67.6 kg)     PHYSICAL EXAM: General:  Well appearing. No resp difficulty HEENT: normal Neck: supple. no JVD. Carotids 2+ bilat; no bruits. No lymphadenopathy or thryomegaly appreciated. Cor: PMI nondisplaced. Regular rate & rhythm. No rubs, gallops or murmurs. Lungs: clear Abdomen: soft, nontender, nondistended. No hepatosplenomegaly. No bruits or masses. Good bowel sounds. Extremities: no cyanosis, clubbing, rash, edema Neuro: alert & orientedx3, cranial nerves grossly  intact. moves all 4 extremities w/o difficulty. Affect pleasant   ASSESSMENT & PLAN: 1. Breast CA: Follows with Dr. Jana Hakim.   --s/p left lumpectomy. Completed cyclophosphamide and doxorubicin in dose dense fashion 4 started 02/01/2016, completed 03/12/2016, followed by weekly Abraxane 12. Now s/p XRT -- ECHO reviewed personally and EF stable  2. Chest pain  --has typical and atypical features. Echo images and ECG reviewed personlly and are normal.  --Will reschedule ETT/ Myoview to further evaluate  3. HTN  --  Elevated today. Increase   4. Palpitations --will place 48-holter monitor --continue metoprolol  Glori Bickers, MD 12/02/16

## 2016-12-04 ENCOUNTER — Telehealth (HOSPITAL_COMMUNITY): Payer: Self-pay | Admitting: *Deleted

## 2016-12-04 NOTE — Telephone Encounter (Signed)
Patient given detailed instructions per Myocardial Perfusion Study Information Sheet for the test on 12/09/16. Patient notified to arrive 15 minutes early and that it is imperative to arrive on time for appointment to keep from having the test rescheduled.  If you need to cancel or reschedule your appointment, please call the office within 24 hours of your appointment. . Patient verbalized understanding. Kirstie Peri

## 2016-12-09 ENCOUNTER — Ambulatory Visit (HOSPITAL_COMMUNITY): Payer: Medicare Other | Attending: Cardiology

## 2016-12-09 DIAGNOSIS — R072 Precordial pain: Secondary | ICD-10-CM | POA: Diagnosis not present

## 2016-12-09 LAB — MYOCARDIAL PERFUSION IMAGING
CHL CUP NUCLEAR SRS: 2
CHL CUP NUCLEAR SSS: 4
LV sys vol: 23 mL
LVDIAVOL: 59 mL (ref 46–106)
NUC STRESS TID: 1.1
Peak HR: 117 {beats}/min
RATE: 0.32
Rest HR: 99 {beats}/min
SDS: 2

## 2016-12-09 MED ORDER — REGADENOSON 0.4 MG/5ML IV SOLN
0.4000 mg | Freq: Once | INTRAVENOUS | Status: AC
  Administered 2016-12-09: 0.4 mg via INTRAVENOUS

## 2016-12-09 MED ORDER — TECHNETIUM TC 99M TETROFOSMIN IV KIT
28.8000 | PACK | Freq: Once | INTRAVENOUS | Status: AC | PRN
  Administered 2016-12-09: 28.8 via INTRAVENOUS
  Filled 2016-12-09: qty 29

## 2016-12-09 MED ORDER — TECHNETIUM TC 99M TETROFOSMIN IV KIT
8.7000 | PACK | Freq: Once | INTRAVENOUS | Status: AC | PRN
  Administered 2016-12-09: 8.7 via INTRAVENOUS
  Filled 2016-12-09: qty 9

## 2016-12-16 ENCOUNTER — Ambulatory Visit
Admission: RE | Admit: 2016-12-16 | Discharge: 2016-12-16 | Disposition: A | Discharge status: Home / Self Care | Payer: BLUE CROSS/BLUE SHIELD | Source: Ambulatory Visit | Attending: Radiation Oncology | Admitting: Radiation Oncology

## 2016-12-16 ENCOUNTER — Other Ambulatory Visit: Payer: Self-pay

## 2016-12-16 ENCOUNTER — Encounter: Payer: Self-pay | Admitting: Radiation Oncology

## 2016-12-16 DIAGNOSIS — Z171 Estrogen receptor negative status [ER-]: Principal | ICD-10-CM

## 2016-12-16 DIAGNOSIS — C50412 Malignant neoplasm of upper-outer quadrant of left female breast: Secondary | ICD-10-CM | POA: Diagnosis not present

## 2016-12-16 NOTE — Progress Notes (Signed)
Radiation Oncology         [phone] ________________________________  Name: [name] MRN: [id number]  Date: 12/16/2016  DOB: March 14, 1950  Follow-Up Visit Note  CC: Carlyle Basques, MD  Alphonsa Overall, MD    ICD-10-CM   1. Malignant neoplasm of upper-outer quadrant of left breast in female, estrogen receptor negative ([place]) C50.412    Z17.1     Diagnosis:   Clinical Stage IIA (T2 N0)LeftBreast UOQ Invasive Ductal Carcinoma, ER-/ PR-/ Her2-, Grade 3, post left breast lumpectomy and adjuvant chemotherapy  Interval Since Last Radiation: 3 months   07/29/2016-09/12/2016                   Left breast: 50.4 Gy in 28 fractions Boost: 10 gy in 5 fractions  Narrative: Patient presents for follow up evaluation of left breast cancer. She reports that she is doing well overall. She reports the redness that was previously present on the left breast has resolved. She states she has a mammogram scheduled in the next week.   On review of systems, she denies breast pain or redness. She denies any significant weight changes or any other complaints at this time.    ALLERGIES:  has No Known Allergies.  Meds: Current Outpatient Medications  Medication Sig Dispense Refill  . amLODipine (NORVASC) 10 MG tablet Take 1 tablet (10 mg total) daily by mouth. 30 tablet 3  . aspirin EC 81 MG tablet Take 81 mg by mouth daily.    . benazepril (LOTENSIN) 40 MG tablet Take 1 tablet (40 mg total) by mouth daily. 30 tablet 5  . bictegravir-emtricitabine-tenofovir AF (BIKTARVY) 50-200-25 MG TABS tablet Take 1 tablet by mouth daily. 30 tablet 6  . Biotin 5000 MCG CAPS Take 5,000 mcg daily by mouth.    . Coenzyme Q10 300 MG CAPS Take 300 capsules by mouth daily.    . cyclobenzaprine (FLEXERIL) 10 MG tablet TAKE ONE TABLET BY MOUTH THREE TIMES DAILY AS NEEDED FOR MUSCLE SPASMS (Patient taking differently: Take 10 mg daily as needed by mouth. TAKE ONE TABLET BY MOUTH THREE TIMES DAILY AS NEEDED FOR MUSCLE  SPASMS) 90 tablet 5  . fexofenadine (ALLEGRA) 180 MG tablet Take 180 mg daily by mouth.    . gabapentin (NEURONTIN) 100 MG capsule Take 1 capsule (100 mg total) by mouth 2 (two) times daily. (Patient taking differently: Take 100 mg daily by mouth. ) 180 capsule 1  . metoprolol tartrate (LOPRESSOR) 25 MG tablet Take 0.5 tablets (12.5 mg total) by mouth 2 (two) times daily. 30 tablet 3  . Multiple Vitamin (MULTIVITAMIN WITH MINERALS) TABS tablet Take 1 tablet by mouth daily.    . Omega-3 Fatty Acids (FISH OIL) 1200 MG CAPS Take 1,200 mg by mouth 2 (two) times daily.      . rosuvastatin (CRESTOR) 5 MG tablet Take 1 tablet (5 mg total) by mouth at bedtime. 30 tablet 5   No current facility-administered medications for this encounter.    Facility-Administered Medications Ordered in Other Encounters  Medication Dose Route Frequency Provider Last Rate Last Dose  . sodium chloride flush (NS) 0.9 % injection 10 mL  10 mL Intravenous PRN Magrinat, Virgie Dad, MD   10 mL at 05/21/16 1022    Physical Findings: The patient is in no acute distress. Patient is alert and oriented.  height is '5\' 5"'  (1.651 m) and weight is 150 lb 12.8 oz (68.4 kg). Her oral temperature is 98.2 F (36.8 C). Her blood pressure  is 133/84 and her pulse is 83. Her oxygen saturation is 100%. .    Lungs are clear to auscultation bilaterally. Heart has regular rate and rhythm. No palpable cervical, supraclavicular, or axillary adenopathy. Abdomen soft, non-tender, normal bowel sounds. Right breast with no palpable mass or nipple discharge. Left breast with mild edema and mild hyperpigmentation changes. No dominant mass, nipple discharge or bleeding. No signs of infection or erythema in the breasts at this time.     Lab Findings: Lab Results  Component Value Date   WBC 6.7 09/23/2016   HGB 14.4 09/23/2016   HCT 41.6 09/23/2016   MCV 97.0 09/23/2016   PLT 219 09/23/2016    Radiographic Findings: No results found.  Impression:  Clinical Stage IIA (T2 N0)LeftBreast UOQ Invasive Ductal Carcinoma, ER-/ PR-/ Her2-, Grade 3, post left breast lumpectomy and adjuvant radiation therapy  No evidence of recurrence on clinical exam.  Plan: The pt will return for close f/u with medical oncology. When necessary follow-up in radiation oncology. She will continue to follow up with medical oncology and survivorship.  ____________________________________ -----------------------------------  Blair Promise, PhD, MD  This document serves as a record of services personally performed by Gery Pray, MD. It was created on his behalf by Marlowe Kays, a trained medical scribe. The creation of this record is based on the scribe's personal observations and the provider's statements to them. This document has been checked and approved by the attending provider.

## 2016-12-16 NOTE — Progress Notes (Addendum)
[name] is here for follow up after treatment to her left breast.  She denies having any pain today.  She said she had a stress test and echo recently which were normal.  She reports her energy level is not all the way back to normal yet.  The skin on her left breast is intact.  The small area that was red before has resolved and has left a small scarred area.  BP 133/84 (BP Location: Right Arm, Patient Position: Sitting)   Pulse 83   Temp 98.2 F (36.8 C) (Oral)   Ht 5\' 5"  (1.651 m)   Wt 150 lb 12.8 oz (68.4 kg)   SpO2 100%   BMI 25.09 kg/m    Wt Readings from Last 3 Encounters:  12/16/16 150 lb 12.8 oz (68.4 kg)  12/09/16 149 lb (67.6 kg)  12/02/16 149 lb (67.6 kg)

## 2016-12-17 ENCOUNTER — Ambulatory Visit (INDEPENDENT_AMBULATORY_CARE_PROVIDER_SITE_OTHER): Payer: Medicare Other

## 2016-12-17 ENCOUNTER — Other Ambulatory Visit (HOSPITAL_COMMUNITY): Payer: Self-pay | Admitting: Internal Medicine

## 2016-12-17 DIAGNOSIS — R072 Precordial pain: Secondary | ICD-10-CM | POA: Diagnosis not present

## 2016-12-17 DIAGNOSIS — R002 Palpitations: Secondary | ICD-10-CM | POA: Diagnosis not present

## 2016-12-23 ENCOUNTER — Encounter: Payer: BLUE CROSS/BLUE SHIELD | Admitting: Adult Health

## 2016-12-25 ENCOUNTER — Ambulatory Visit
Admission: RE | Admit: 2016-12-25 | Discharge: 2016-12-25 | Disposition: A | Discharge status: Home / Self Care | Payer: Medicare Other | Source: Ambulatory Visit | Attending: Oncology | Admitting: Oncology

## 2016-12-25 DIAGNOSIS — R922 Inconclusive mammogram: Secondary | ICD-10-CM | POA: Diagnosis not present

## 2016-12-25 DIAGNOSIS — C50412 Malignant neoplasm of upper-outer quadrant of left female breast: Secondary | ICD-10-CM

## 2016-12-25 DIAGNOSIS — Z171 Estrogen receptor negative status [ER-]: Principal | ICD-10-CM

## 2016-12-25 HISTORY — DX: Personal history of antineoplastic chemotherapy: Z92.21

## 2016-12-25 HISTORY — DX: Personal history of irradiation: Z92.3

## 2016-12-30 ENCOUNTER — Other Ambulatory Visit: Payer: Self-pay | Admitting: Internal Medicine

## 2016-12-30 NOTE — Progress Notes (Signed)
[facility]  [phone] [phone]     ID: [name] DOB: 26-Dec-1950  MR#: [id number]  OZY#:[id number]  Patient Care Team: Carlyle Basques, MD as PCP - General (Infectious Diseases) Carlyle Basques, MD as PCP - Infectious Diseases (Infectious Diseases) Alphonsa Overall, MD as Consulting Physician (General Surgery) Magrinat, Virgie Dad, MD as Consulting Physician (Oncology) Gery Pray, MD as Consulting Physician (Radiation Oncology) Marcial Pacas, MD as Consulting Physician (Neurology) Druscilla Brownie, MD as Consulting Physician (Dermatology) Gardenia Phlegm, NP as Nurse Practitioner (Hematology and Oncology) Christianne Borrow, DMD (Dentistry) OTHER MD:  CHIEF COMPLAINT: Triple negative breast cancer  CURRENT TREATMENT: Observation   BREAST CANCER HISTORY:  From the origil intake note:  [name] herself palpated a mass in the upper outer quadrant of her left breast the first week in November, and had bilateral diagnostic mammography with tomography and left breast ultrasonography at the [facility] 12/05/2015. The breast density was category C. In the upper outer left breast there was  A mass with macro lobulated contours which was barely palpable at the 12:30 o'clock position of the left breast 7 cm from the nipple. There was no palpable axillary adenopathy. Ultrasonography confirmed a 2.1 cm obliquely oriented hypoechoic mass in the area in question. Ultrasound of the left axilla was benign.  Biopsy of the left breast mass in question 12/07/2015 showed ([id number]) invasive ductal carcinoma, grade 3, estrogen and progesterone receptor negative, with an MIB-1 of 35%, and no HER-2 amplification, the signals ratio being 0.95 and the number per cell 2.80.  Her subsequent history is as detailed below  INTERVAL HISTORY: [name] returns today for follow-up of her estrogen receptor negative breast cancer.   Since her last visit to the  office, she had a diagnostic mammogram with tomography completed at [facility] on 12/25/2016 with results showing: Breast density category C. No mammographic evidence of malignancy in the bilateral breasts.        REVIEW OF SYSTEMS: [name] reports that's she is doing well. She reports having tingly sensations in the 2nd and 3rd digits on the left hand. She reports that her pulse has been elevated lately and she has been able to hear her pulse when she lays down at night. She went to complete an echo and stress test. She notes that the doctor said that the elevated pulse could be a result from the stress of having radiation. She reports that she has been walking for 20 minutes a day. She doesn't work anymore, but she completes her own house work. She reports that her husband passed away in 06/14/2016. Her son checks on her often. She denies unusual headaches, visual changes, nausea, vomiting, or dizziness. There has been no unusual cough, phlegm production, or pleurisy. This been no change in bowel or bladder habits. She denies unexplained fatigue or unexplained weight loss, bleeding, rash, or fever. A detailed review of systems was otherwise stable.   PAST MEDICAL HISTORY: Past Medical History:  Diagnosis Date  . HIV (human immunodeficiency virus infection) ([place])   . Hypertension   . Malignant neoplasm of upper-outer quadrant of left female breast ([place]) 12/11/2015  . Migraine   . Neuropathy   . Personal history of chemotherapy 2018  . Personal history of radiation therapy 2018  . PONV (postoperative nausea and vomiting)    said usually has to have scop patch  . Rash and nonspecific skin eruption 10/16/2015  . Spinal stenosis     PAST SURGICAL  HISTORY: Past Surgical History:  Procedure Laterality Date  . ABDOMINAL HYSTERECTOMY  1990  . BREAST BIOPSY Left 12/07/2015  . BREAST LUMPECTOMY Left 01/01/2016  . BREAST LUMPECTOMY WITH RADIOACTIVE SEED AND SENTINEL LYMPH NODE BIOPSY Left  01/01/2016   Procedure: LEFT BREAST LUMPECTOMY WITH RADIOACTIVE SEED AND SENTINEL LYMPH NODE BIOPSY;  Surgeon: Alphonsa Overall, MD;  Location: [facility];  Service: General;  Laterality: Left;  . CARPAL TUNNEL RELEASE     both hands  . CERVICAL FUSION  1989  . PORTACATH PLACEMENT Right 01/01/2016   Procedure: INSERTION PORT-A-CATH WITH [name];  Surgeon: Alphonsa Overall, MD;  Location: [facility];  Service: General;  Laterality: Right;  . TONSILLECTOMY      FAMILY HISTORY Family History  Problem Relation Age of Onset  . High blood pressure Mother   . High Cholesterol Mother   . Cancer Father   . High blood pressure Father   . High Cholesterol Father   . Stomach cancer Father        intestinal  . Lung cancer Maternal Grandfather   . Breast cancer Maternal Aunt 20  The patient's father died from heart disease at the age of 8. The patient's mother died from "genetic cirrhosis" at the age of 32. The patient had 2 brothers, 5 sisters. A maternal aunt was diagnosed with breast cancer at age 20. There is no other history of breast or ovarian cancer in the family to the patient's knowledge   GYNECOLOGIC HISTORY:  No LMP recorded. Patient has had a hysterectomy. Menarche age 75, first live birth age 77. Patient is GX P1. She underwent hysterectomy with bilateral salpingo-oophorectomy at age 49. She took hormone replacement for approximately 10 years, until age 45. She also had oral contraceptives remotely for 8-10 years, with no complications.   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 She is currently not working. She now lives by herself. Her husband, [name] who retired from computer work, passed away in 06-07-2016. Their son [name] works as a Chief Strategy Officer for rest home in [place]. The patient has 3 grandchildren. She is not a Ambulance person.   ADVANCED DIRECTIVES: The patient has a living will in place   HEALTH  MAINTENANCE: Social History   Tobacco Use  . Smoking status: Never Smoker  . Smokeless tobacco: Never Used  Substance Use Topics  . Alcohol use: Yes    Alcohol/week: 0.6 oz    Types: 1 Glasses of wine per week    Comment: on weekends  . Drug use: No     Colonoscopy: 2012/be routinely  PAP: Status post hysterectomy  Bone density: Never   No Known Allergies  Current Outpatient Medications  Medication Sig Dispense Refill  . amLODipine (NORVASC) 10 MG tablet Take 1 tablet (10 mg total) daily by mouth. 30 tablet 3  . aspirin EC 81 MG tablet Take 81 mg by mouth daily.    . benazepril (LOTENSIN) 40 MG tablet TAKE ONE TABLET BY MOUTH EVERY DAY 30 tablet 5  . bictegravir-emtricitabine-tenofovir AF (BIKTARVY) 50-200-25 MG TABS tablet Take 1 tablet by mouth daily. 30 tablet 6  . Biotin 5000 MCG CAPS Take 5,000 mcg daily by mouth.    . Coenzyme Q10 300 MG CAPS Take 300 capsules by mouth daily.    . cyclobenzaprine (FLEXERIL) 10 MG tablet TAKE ONE TABLET BY MOUTH THREE TIMES DAILY AS NEEDED FOR MUSCLE SPASMS (Patient taking differently: Take 10 mg  daily as needed by mouth. TAKE ONE TABLET BY MOUTH THREE TIMES DAILY AS NEEDED FOR MUSCLE SPASMS) 90 tablet 5  . fexofenadine (ALLEGRA) 180 MG tablet Take 180 mg daily by mouth.    . gabapentin (NEURONTIN) 100 MG capsule Take 1 capsule (100 mg total) by mouth 2 (two) times daily. (Patient taking differently: Take 100 mg daily by mouth. ) 180 capsule 1  . metoprolol tartrate (LOPRESSOR) 25 MG tablet Take 0.5 tablets (12.5 mg total) by mouth 2 (two) times daily. 30 tablet 3  . Multiple Vitamin (MULTIVITAMIN WITH MINERALS) TABS tablet Take 1 tablet by mouth daily.    . Omega-3 Fatty Acids (FISH OIL) 1200 MG CAPS Take 1,200 mg by mouth 2 (two) times daily.      . rosuvastatin (CRESTOR) 5 MG tablet Take 1 tablet (5 mg total) by mouth at bedtime. 30 tablet 5   No current facility-administered medications for this visit.    Facility-Administered  Medications Ordered in Other Visits  Medication Dose Route Frequency Provider Last Rate Last Dose  . sodium chloride flush (NS) 0.9 % injection 10 mL  10 mL Intravenous PRN Magrinat, Virgie Dad, MD   10 mL at 05/21/16 1022    OBJECTIVE:Middle-aged white woman who looks well  Vitals:   12/31/16 1105  BP: 136/84  Pulse: 90  Resp: 18  Temp: 97.8 F (36.6 C)  SpO2: 99%     Body mass index is 25.78 kg/m.    ECOG FS:0 - Asymptomatic   Sclerae unicteric, pupils round and equal Oropharynx clear and moist No cervical or supraclavicular adenopathy Lungs no rales or rhonchi Heart regular rate and rhythm Abd soft, nontender, positive bowel sounds MSK no focal spinal tenderness, no upper extremity lymphedema Neuro: nonfocal, well oriented, appropriate affect Breasts: The right breast is benign.  The left breast is status post lumpectomy and radiation.  There are no skin or nipple changes of concern.  The thickening of the skin in the inferior aspect of the left breast is expected from the radiation treatments.  Both axillae are benign.  LAB RESULTS:  CMP     Component Value Date/Time   NA 142 09/23/2016 1344   K 4.2 09/23/2016 1344   CL 106 03/28/2016 1139   CO2 30 (H) 09/23/2016 1344   GLUCOSE 100 09/23/2016 1344   BUN 12.5 09/23/2016 1344   CREATININE 1.0 09/23/2016 1344   CALCIUM 10.0 09/23/2016 1344   PROT 7.4 09/23/2016 1344   ALBUMIN 4.2 09/23/2016 1344   AST 34 09/23/2016 1344   ALT 28 09/23/2016 1344   ALKPHOS 83 09/23/2016 1344   BILITOT 0.34 09/23/2016 1344   GFRNONAA 87 03/28/2016 1139   GFRAA >89 03/28/2016 1139    INo results found for: SPEP, UPEP  Lab Results  Component Value Date   WBC 6.2 12/31/2016   NEUTROABS 3.6 12/31/2016   HGB 14.5 12/31/2016   HCT 43.0 12/31/2016   MCV 97.0 12/31/2016   PLT 265 12/31/2016      Chemistry      Component Value Date/Time   NA 142 09/23/2016 1344   K 4.2 09/23/2016 1344   CL 106 03/28/2016 1139   CO2 30 (H)  09/23/2016 1344   BUN 12.5 09/23/2016 1344   CREATININE 1.0 09/23/2016 1344      Component Value Date/Time   CALCIUM 10.0 09/23/2016 1344   ALKPHOS 83 09/23/2016 1344   AST 34 09/23/2016 1344   ALT 28 09/23/2016 1344   BILITOT 0.34 09/23/2016  1344       No results found for: LABCA2  No components found for: LABCA125  No results for input(s): INR in the last 168 hours.  Urinalysis    Component Value Date/Time   COLORURINE YELLOW 03/21/2007 1108   APPEARANCEUR CLEAR 03/21/2007 1108   LABSPEC 1.021 03/21/2007 1108   PHURINE 7.0 03/21/2007 1108   GLUCOSEU 100 (A) 03/21/2007 1108   HGBUR NEGATIVE 03/21/2007 1108   BILIRUBINUR NEGATIVE 03/21/2007 1108   KETONESUR NEGATIVE 03/21/2007 1108   PROTEINUR NEGATIVE 03/21/2007 1108   UROBILINOGEN 1.0 03/21/2007 1108   NITRITE NEGATIVE 03/21/2007 1108   LEUKOCYTESUR SMALL (A) 03/21/2007 1108     STUDIES: Mm Diag Breast Tomo Bilateral  Result Date: 12/25/2016 CLINICAL DATA:  66 year old female presenting for routine annual surveillance status post left breast lumpectomy in 2017. EXAM: 2D DIGITAL DIAGNOSTIC BILATERAL MAMMOGRAM WITH CAD AND ADJUNCT TOMO COMPARISON:  Previous exam(s). ACR Breast Density Category c: The breast tissue is heterogeneously dense, which may obscure small masses. FINDINGS: Interval surgical changes are noted in the superior left breast at the lumpectomy site. There is no evidence of residual disease. There is a new asymmetry in the upper inner quadrant of the right breast, deep to a scar marker and is consistent with the site of the patient's prior port. No suspicious calcifications, masses or areas of distortion are seen in the bilateral breasts. Mammographic images were processed with CAD. IMPRESSION: 1. Surgical changes without evidence of residual disease at the left breast lumpectomy site. 2. The asymmetry in the upper inner right breast corresponds with the patient's prior port. 3.  No mammographic evidence of  malignancy in the bilateral breasts. RECOMMENDATION: Diagnostic mammogram is suggested in 1 year. (Code:[id number]-B-01Y) I have discussed the findings and recommendations with the patient. Results were also provided in writing at the conclusion of the visit. If applicable, a reminder letter will be sent to the patient regarding the next appointment. BI-RADS CATEGORY  2: Benign. Electronically Signed   By: Ammie Ferrier M.D.   On: 12/25/2016 12:03     ELIGIBLE FOR AVAILABLE RESEARCH PROTOCOL: no  ASSESSMENT: 66 y.o. [place] woman status post left breast upper outer quadrant biopsy 12/07/2015 for a clinical T2 N0, stage IIA invasive ductal carcinoma, grade 3, triple negative, with an MIB-1 of 35%  (1) genetics testing 01/23/2016  (a) alpha -1- anti trypsin and ceruloplamin labs drawn 01/19/2016, both normal  (2) Status postf left lumpectomy/ sentinel lymph node sampling 01/01/2016 for a pT1c pN0, stage IA  Invasive ductal carcinoma, grade 3, with negative margins.  (3) Mammaprint sent from the patient's 12/07/2015 biopsy returned "high risk", predicting a risk of recurrence of nearly 30% untreated, decreasing to a 93% chance of five-year distant metastasis free survival with chemotherapy.  (4) adjuvant chemotherapy consisting of cyclophosphamide and doxorubicin in dose dense fashion 4 started 02/01/2016, completed 03/12/2016, followed by weekly Abraxane 12 starting 04/02/2016,  (a) changed on 04/30/16 to Abraxane days 1,8,15, with Neulasta support on day 16 of each 28 day cycle,   (b) then changed on 4/17 to Abraxane days 1, 8, on a 21 day cycle with Neulasta on day 9 due to neutropenia  (c) taxanes stopped after 9 doses due to neutropenia, last dose 06/18/2016  (5) adjuvant radiation 07/29/2016-09/12/2016 Site/dose:   1.) Breast, Left 1.8 Gy in 28 fractions                         2.) Breast, Left  Boost 2 Gy in 5 fractions   (6) HIV positivity: Followed by Dr. Graylon Good. June 2018 labs show a T4  abs 450, percentage 31%, HIV RNA undetectable  PLAN: Odile is now a year out from definitive surgery for her breast cancer with no evidence of disease recurrence.  This is favorable.  She is still not quite up to her prior level of function, as she can only walk 20 minutes at a time.  I have encouraged her to try to do that twice a day if she can possibly do it.  Otherwise she is essentially having no residuals from her prior treatments  She requested a note so that she may have some dental work and there are no restrictions regarding that.  Her counts are excellent from my point of view  She will continue to follow closely with Dr. Graylon Good for the HIV issue.  She will see me again in June and then in December 2019 and from that point I will start seeing her on a yearly basis.  She knows to call for any other problems that may develop before the next visit.   Magrinat, Virgie Dad, MD  12/31/16 11:26 AM Medical Oncology and Hematology [facility] [address] [place], [place] [address] Tel. [phone]    Fax. [phone]    This document serves as a record of services personally performed by Lurline Del, MD. It was created on his behalf by Sheron Nightingale, a trained medical scribe. The creation of this record is based on the scribe's personal observations and the provider's statements to them.   I have reviewed the above documentation for accuracy and completeness, and I agree with the above.

## 2016-12-30 NOTE — Telephone Encounter (Signed)
Results from holster monitor given to patient. Verbalized understanding.  Balinda Quails RN, VAD Coordinator 24/7 pager [phone]

## 2016-12-31 ENCOUNTER — Ambulatory Visit (HOSPITAL_BASED_OUTPATIENT_CLINIC_OR_DEPARTMENT_OTHER): Payer: Medicare Other | Admitting: Oncology

## 2016-12-31 ENCOUNTER — Other Ambulatory Visit (HOSPITAL_BASED_OUTPATIENT_CLINIC_OR_DEPARTMENT_OTHER): Payer: Medicare Other

## 2016-12-31 ENCOUNTER — Telehealth: Payer: Self-pay | Admitting: Oncology

## 2016-12-31 VITALS — BP 136/84 | HR 90 | Temp 97.8°F | Resp 18 | Ht 65.0 in | Wt 154.9 lb

## 2016-12-31 DIAGNOSIS — Z21 Asymptomatic human immunodeficiency virus [HIV] infection status: Secondary | ICD-10-CM | POA: Diagnosis not present

## 2016-12-31 DIAGNOSIS — Z853 Personal history of malignant neoplasm of breast: Secondary | ICD-10-CM

## 2016-12-31 DIAGNOSIS — C50412 Malignant neoplasm of upper-outer quadrant of left female breast: Secondary | ICD-10-CM

## 2016-12-31 DIAGNOSIS — Z171 Estrogen receptor negative status [ER-]: Principal | ICD-10-CM

## 2016-12-31 LAB — CBC WITH DIFFERENTIAL/PLATELET
BASO%: 0.8 % (ref 0.0–2.0)
BASOS ABS: 0.1 10*3/uL (ref 0.0–0.1)
EOS ABS: 0.2 10*3/uL (ref 0.0–0.5)
EOS%: 3.4 % (ref 0.0–7.0)
HCT: 43 % (ref 34.8–46.6)
HGB: 14.5 g/dL (ref 11.6–15.9)
LYMPH#: 1.6 10*3/uL (ref 0.9–3.3)
LYMPH%: 26.2 % (ref 14.0–49.7)
MCH: 32.9 pg (ref 25.1–34.0)
MCHC: 33.9 g/dL (ref 31.5–36.0)
MCV: 97 fL (ref 79.5–101.0)
MONO#: 0.7 10*3/uL (ref 0.1–0.9)
MONO%: 11.6 % (ref 0.0–14.0)
NEUT%: 58 % (ref 38.4–76.8)
NEUTROS ABS: 3.6 10*3/uL (ref 1.5–6.5)
PLATELETS: 265 10*3/uL (ref 145–400)
RBC: 4.43 10*6/uL (ref 3.70–5.45)
RDW: 12.9 % (ref 11.2–14.5)
WBC: 6.2 10*3/uL (ref 3.9–10.3)

## 2016-12-31 LAB — COMPREHENSIVE METABOLIC PANEL
ALT: 28 U/L (ref 0–55)
AST: 31 U/L (ref 5–34)
Albumin: 4.2 g/dL (ref 3.5–5.0)
Alkaline Phosphatase: 80 U/L (ref 40–150)
Anion Gap: 8 mEq/L (ref 3–11)
BILIRUBIN TOTAL: 0.4 mg/dL (ref 0.20–1.20)
BUN: 11.3 mg/dL (ref 7.0–26.0)
CHLORIDE: 104 meq/L (ref 98–109)
CO2: 28 meq/L (ref 22–29)
CREATININE: 0.9 mg/dL (ref 0.6–1.1)
Calcium: 9.6 mg/dL (ref 8.4–10.4)
GLUCOSE: 75 mg/dL (ref 70–140)
Potassium: 3.9 mEq/L (ref 3.5–5.1)
SODIUM: 140 meq/L (ref 136–145)
TOTAL PROTEIN: 7.3 g/dL (ref 6.4–8.3)

## 2016-12-31 NOTE — Telephone Encounter (Signed)
Gave patient AVS. Patient declined calendar of upcoming June 2019 appointments.

## 2017-01-02 ENCOUNTER — Telehealth: Payer: Self-pay | Admitting: Oncology

## 2017-01-02 NOTE — Telephone Encounter (Signed)
Left message for patient regarding upcoming June appointments.  °

## 2017-01-24 ENCOUNTER — Other Ambulatory Visit (HOSPITAL_COMMUNITY): Payer: Self-pay | Admitting: Cardiology

## 2017-01-24 MED ORDER — METOPROLOL TARTRATE 25 MG PO TABS: 12.5000 mg | ORAL_TABLET | Freq: Two times a day (BID) | ORAL | 3 refills | Status: DC

## 2017-02-04 ENCOUNTER — Other Ambulatory Visit: Payer: Self-pay | Admitting: *Deleted

## 2017-02-04 DIAGNOSIS — B20 Human immunodeficiency virus [HIV] disease: Secondary | ICD-10-CM

## 2017-02-04 DIAGNOSIS — Z113 Encounter for screening for infections with a predominantly sexual mode of transmission: Secondary | ICD-10-CM

## 2017-02-05 ENCOUNTER — Other Ambulatory Visit: Payer: Medicare Other

## 2017-02-05 ENCOUNTER — Other Ambulatory Visit (HOSPITAL_COMMUNITY)
Admission: RE | Admit: 2017-02-05 | Discharge: 2017-02-05 | Disposition: A | Discharge status: Home / Self Care | Payer: Medicare Other | Source: Ambulatory Visit | Attending: Internal Medicine | Admitting: Internal Medicine

## 2017-02-05 DIAGNOSIS — B20 Human immunodeficiency virus [HIV] disease: Secondary | ICD-10-CM

## 2017-02-05 DIAGNOSIS — Z171 Estrogen receptor negative status [ER-]: Secondary | ICD-10-CM

## 2017-02-05 DIAGNOSIS — Z113 Encounter for screening for infections with a predominantly sexual mode of transmission: Secondary | ICD-10-CM | POA: Insufficient documentation

## 2017-02-05 DIAGNOSIS — C50412 Malignant neoplasm of upper-outer quadrant of left female breast: Secondary | ICD-10-CM

## 2017-02-05 DIAGNOSIS — C50912 Malignant neoplasm of unspecified site of left female breast: Secondary | ICD-10-CM | POA: Diagnosis not present

## 2017-02-05 LAB — CBC WITH DIFFERENTIAL/PLATELET
BASOS ABS: 72 {cells}/uL (ref 0–200)
Basophils Relative: 1.2 %
EOS ABS: 162 {cells}/uL (ref 15–500)
Eosinophils Relative: 2.7 %
HEMATOCRIT: 42.5 % (ref 35.0–45.0)
HEMOGLOBIN: 15.1 g/dL (ref 11.7–15.5)
LYMPHS ABS: 1716 {cells}/uL (ref 850–3900)
MCH: 32.6 pg (ref 27.0–33.0)
MCHC: 35.5 g/dL (ref 32.0–36.0)
MCV: 91.8 fL (ref 80.0–100.0)
MPV: 9.8 fL (ref 7.5–12.5)
Monocytes Relative: 10.3 %
NEUTROS ABS: 3432 {cells}/uL (ref 1500–7800)
Neutrophils Relative %: 57.2 %
Platelets: 279 10*3/uL (ref 140–400)
RBC: 4.63 10*6/uL (ref 3.80–5.10)
RDW: 12.4 % (ref 11.0–15.0)
Total Lymphocyte: 28.6 %
WBC: 6 10*3/uL (ref 3.8–10.8)
WBCMIX: 618 {cells}/uL (ref 200–950)

## 2017-02-06 LAB — T-HELPER CELL (CD4) - (RCID CLINIC ONLY)
CD4 % Helper T Cell: 22 % — ABNORMAL LOW (ref 33–55)
CD4 T CELL ABS: 430 /uL (ref 400–2700)

## 2017-02-06 LAB — URINE CYTOLOGY ANCILLARY ONLY
Chlamydia: NEGATIVE
Neisseria Gonorrhea: NEGATIVE

## 2017-02-06 LAB — RPR: RPR: NONREACTIVE

## 2017-02-07 LAB — HIV-1 RNA QUANT-NO REFLEX-BLD
HIV 1 RNA QUANT: NOT DETECTED {copies}/mL
HIV-1 RNA Quant, Log: <1.3 Log copies/mL

## 2017-02-20 ENCOUNTER — Ambulatory Visit (INDEPENDENT_AMBULATORY_CARE_PROVIDER_SITE_OTHER): Payer: Medicare Other | Admitting: Internal Medicine

## 2017-02-20 ENCOUNTER — Other Ambulatory Visit: Payer: Self-pay | Admitting: *Deleted

## 2017-02-20 ENCOUNTER — Encounter: Payer: Self-pay | Admitting: Internal Medicine

## 2017-02-20 VITALS — BP 152/83 | HR 91 | Temp 98.0°F | Wt 153.0 lb

## 2017-02-20 DIAGNOSIS — M436 Torticollis: Secondary | ICD-10-CM | POA: Diagnosis not present

## 2017-02-20 DIAGNOSIS — T451X5A Adverse effect of antineoplastic and immunosuppressive drugs, initial encounter: Secondary | ICD-10-CM | POA: Diagnosis not present

## 2017-02-20 DIAGNOSIS — G620 Drug-induced polyneuropathy: Secondary | ICD-10-CM

## 2017-02-20 DIAGNOSIS — B20 Human immunodeficiency virus [HIV] disease: Secondary | ICD-10-CM | POA: Diagnosis not present

## 2017-02-20 DIAGNOSIS — I10 Essential (primary) hypertension: Secondary | ICD-10-CM

## 2017-02-20 DIAGNOSIS — M62838 Other muscle spasm: Secondary | ICD-10-CM

## 2017-02-20 MED ORDER — CYCLOBENZAPRINE HCL 10 MG PO TABS: 10.0000 mg | ORAL_TABLET | Freq: Every evening | ORAL | 5 refills | Status: DC | PRN

## 2017-02-20 MED ORDER — GABAPENTIN 100 MG PO CAPS: 100.0000 mg | ORAL_CAPSULE | Freq: Two times a day (BID) | ORAL | 1 refills | Status: DC

## 2017-02-20 NOTE — Progress Notes (Signed)
Clarification requested on flexeril prescription. Corrected and called in per verbal order from Dr Baxter Flattery. Landis Gandy, RN

## 2017-02-20 NOTE — Progress Notes (Signed)
RFV: follow up for hiv disease  Patient ID: [name], female   DOB: [birth year], 67 y.o.   MRN: [id number]  HPI [name] is a 67yo F with well controlled longstanding HIV disease, and also hx of breast ca with s/p left left breast upper lumpectomy with sentinel LN sampling in Dec /2017 for a clinical T2 N0, stage IIA invasive ductal carcinoma, grade 3, triple neg s/p  adjuvant chemo with cyclophosphamide and doxorubicin 4 cycles completed 03/12/2016, followed by weekly Abraxane on 04/02/2016, stopped after 9 doses due to neutropenia, last dose 06/18/2016 plus she finished adjuvant radiation 07/29/2016-09/12/2016. She last saw dr [name] in December who felt her repeat mammo looked good. Next visit in June 2019.   She has been on biktarvy since finishing chemo. CD 4 count of 430/VL<20 in Jan 2019. Previous to her breast ca dx, her CD 4 count was in the [phone] range. She has sustained some myelosuppression from her chemo and now her new baseline for her CD 4 count appears to be in the 400s.  She has had residual tachycardia since being on chemo thus started on a beta blocker by cardiology which has helped  She feels that she is improving her energy. She does has some arthritis to left ankle, left hip and low back - where she is going to ortho for evaluation  She wanted to see if we could refill her flexeril which she uses for torticolis at bedtime and neurontin 100mg  bid for headache/chemo related peripheral neuropahty  Outpatient Encounter Medications as of 02/20/2017  Medication Sig  . amLODipine (NORVASC) 10 MG tablet Take 1 tablet (10 mg total) daily by mouth.  Marland Kitchen aspirin EC 81 MG tablet Take 81 mg by mouth daily.  . benazepril (LOTENSIN) 40 MG tablet TAKE ONE TABLET BY MOUTH EVERY DAY  . bictegravir-emtricitabine-tenofovir AF (BIKTARVY) 50-200-25 MG TABS tablet Take 1 tablet by mouth daily.  . Biotin 5000 MCG CAPS Take 5,000 mcg daily by mouth.  . Coenzyme Q10 300 MG CAPS Take  300 capsules by mouth daily.  . cyclobenzaprine (FLEXERIL) 10 MG tablet TAKE ONE TABLET BY MOUTH THREE TIMES DAILY AS NEEDED FOR MUSCLE SPASMS (Patient taking differently: Take 10 mg daily as needed by mouth. TAKE ONE TABLET BY MOUTH THREE TIMES DAILY AS NEEDED FOR MUSCLE SPASMS)  . fexofenadine (ALLEGRA) 180 MG tablet Take 180 mg daily by mouth.  . gabapentin (NEURONTIN) 100 MG capsule Take 1 capsule (100 mg total) by mouth 2 (two) times daily. (Patient taking differently: Take 100 mg daily by mouth. )  . metoprolol tartrate (LOPRESSOR) 25 MG tablet Take 0.5 tablets (12.5 mg total) by mouth 2 (two) times daily.  . Multiple Vitamin (MULTIVITAMIN WITH MINERALS) TABS tablet Take 1 tablet by mouth daily.  . Omega-3 Fatty Acids (FISH OIL) 1200 MG CAPS Take 1,200 mg by mouth 2 (two) times daily.    . rosuvastatin (CRESTOR) 5 MG tablet Take 1 tablet (5 mg total) by mouth at bedtime.   Facility-Administered Encounter Medications as of 02/20/2017  Medication  . sodium chloride flush (NS) 0.9 % injection 10 mL     Patient Active Problem List   Diagnosis Date Noted  . Muscle spasms of neck 11/25/2016  . Drug-induced neutropenia ([place]) 04/23/2016  . Malignant neoplasm of upper-outer quadrant of left breast in female, estrogen receptor negative ([place]) 12/11/2015  . Rash and nonspecific skin eruption 10/16/2015  . Hereditary and idiopathic peripheral neuropathy 07/16/2012  . Lumbar neuritis 07/16/2012  .  Cervicalgia 07/16/2012  . URI 12/02/2006  . Human immunodeficiency virus (HIV) disease ([place]) 11/08/2005  . HYPERLIPIDEMIA 11/08/2005  . Essential hypertension 11/08/2005  . BRONCHITIS 11/08/2005  . Regional enteritis ([place]) 11/08/2005  . SYMPTOM, PAINFUL RESPIRATION 11/08/2005     Health Maintenance Due  Topic Date Due  . COLONOSCOPY  11/20/2001  . PAP SMEAR  12/17/2010  . DEXA SCAN  11/20/2016  . PNA vac Low Risk Adult (1 of 2 - PCV13) 11/20/2016     Sochx: no smoking or alcohol  use  Review of Systems Review of Systems  Constitutional: Negative for fever, chills, diaphoresis, activity change, appetite change, fatigue and unexpected weight change.  HENT: Negative for congestion, sore throat, rhinorrhea, sneezing, trouble swallowing and sinus pressure.  Eyes: Negative for photophobia and visual disturbance.  Respiratory: Negative for cough, chest tightness, shortness of breath, wheezing and stridor.  Cardiovascular: Negative for chest pain, palpitations and leg swelling.  Gastrointestinal: Negative for nausea, vomiting, abdominal pain, diarrhea, constipation, blood in stool, abdominal distention and anal bleeding.  Genitourinary: Negative for dysuria, hematuria, flank pain and difficulty urinating.  Musculoskeletal: Negative for myalgias, back pain, joint swelling, arthralgias and gait problem.  Skin: Negative for color change, pallor, rash and wound.  Neurological: Negative for dizziness, tremors, weakness and light-headedness.  Hematological: Negative for adenopathy. Does not bruise/bleed easily.  Psychiatric/Behavioral: Negative for behavioral problems, confusion, sleep disturbance, dysphoric mood, decreased concentration and agitation.    Physical Exam  BP (!) 152/83   Pulse 91   Temp 98 F (36.7 C) (Oral)   Wt 153 lb (69.4 kg)   BMI 25.46 kg/m   Constitutional:  oriented to person, place, and time. appears well-developed and well-nourished. No distress.  HENT: [place]/AT, PERRLA, no scleral icterus Mouth/Throat: Oropharynx is clear and moist. No oropharyngeal exudate.  Cardiovascular: Normal rate, regular rhythm and normal heart sounds. Exam reveals no gallop and no friction rub.  No murmur heard.  Pulmonary/Chest: Effort normal and breath sounds normal. No respiratory distress.  has no wheezes.  Neck = supple, no nuchal rigidity Abdominal: Soft. Bowel sounds are normal.  exhibits no distension. There is no tenderness.  Lymphadenopathy: no cervical  adenopathy. No axillary adenopathy Neurological: alert and oriented to person, place, and time.  Skin: Skin is warm and dry. No rash noted. No erythema.  Psychiatric: a normal mood and affect.  behavior is normal.     Lab Results  Component Value Date   CD4TCELL 22 (L) 02/05/2017   Lab Results  Component Value Date   CD4TABS 430 02/05/2017   CD4TABS 420 10/08/2016   CD4TABS 450 07/04/2016   Lab Results  Component Value Date   HIV1RNAQUANT <20 NOT DETECTED 02/05/2017   Lab Results  Component Value Date   HEPBSAB No 03/24/2006   Lab Results  Component Value Date   LABRPR NON-REACTIVE 02/05/2017    CBC Lab Results  Component Value Date   WBC 6.0 02/05/2017   RBC 4.63 02/05/2017   HGB 15.1 02/05/2017   HCT 42.5 02/05/2017   PLT 279 02/05/2017   MCV 91.8 02/05/2017   MCH 32.6 02/05/2017   MCHC 35.5 02/05/2017   RDW 12.4 02/05/2017   LYMPHSABS 1,716 02/05/2017   MONOABS 0.7 12/31/2016   EOSABS 162 02/05/2017    BMET Lab Results  Component Value Date   NA 140 12/31/2016   K 3.9 12/31/2016   CL 106 03/28/2016   CO2 28 12/31/2016   GLUCOSE 75 12/31/2016   BUN 11.3 12/31/2016  CREATININE 0.9 12/31/2016   CALCIUM 9.6 12/31/2016   GFRNONAA 87 03/28/2016   GFRAA >89 03/28/2016      Assessment and Plan   hiv disease = well controlled on her current regimen of biktarvy. Recommend to continue with taking it daily  Breast ca = sees dr [name] in June for evaluation.thus far in remission at 1 year out  HTN = will allow for permissive hypertension. Will check at next visit  HLD= will check fast lipids at next visit  Peripheral neuropathy = will refill neurontin 100mg  bid  Torticollis = will refill flexeril

## 2017-02-24 ENCOUNTER — Other Ambulatory Visit (HOSPITAL_COMMUNITY): Payer: Self-pay | Admitting: Internal Medicine

## 2017-03-03 DIAGNOSIS — K5900 Constipation, unspecified: Secondary | ICD-10-CM | POA: Diagnosis not present

## 2017-03-03 DIAGNOSIS — K625 Hemorrhage of anus and rectum: Secondary | ICD-10-CM | POA: Diagnosis not present

## 2017-03-10 DIAGNOSIS — D126 Benign neoplasm of colon, unspecified: Secondary | ICD-10-CM | POA: Diagnosis not present

## 2017-03-10 DIAGNOSIS — Z1211 Encounter for screening for malignant neoplasm of colon: Secondary | ICD-10-CM | POA: Diagnosis not present

## 2017-03-10 DIAGNOSIS — K6289 Other specified diseases of anus and rectum: Secondary | ICD-10-CM | POA: Diagnosis not present

## 2017-03-12 DIAGNOSIS — D126 Benign neoplasm of colon, unspecified: Secondary | ICD-10-CM | POA: Diagnosis not present

## 2017-03-25 ENCOUNTER — Other Ambulatory Visit: Payer: Self-pay | Admitting: Internal Medicine

## 2017-03-25 DIAGNOSIS — E7800 Pure hypercholesterolemia, unspecified: Secondary | ICD-10-CM

## 2017-04-03 DIAGNOSIS — K625 Hemorrhage of anus and rectum: Secondary | ICD-10-CM | POA: Diagnosis not present

## 2017-04-03 DIAGNOSIS — K5901 Slow transit constipation: Secondary | ICD-10-CM | POA: Diagnosis not present

## 2017-04-22 ENCOUNTER — Other Ambulatory Visit (HOSPITAL_COMMUNITY): Payer: Self-pay | Admitting: Internal Medicine

## 2017-04-22 ENCOUNTER — Other Ambulatory Visit: Payer: Self-pay | Admitting: Pharmacist

## 2017-04-22 DIAGNOSIS — B20 Human immunodeficiency virus [HIV] disease: Secondary | ICD-10-CM

## 2017-05-13 ENCOUNTER — Other Ambulatory Visit: Payer: Medicare Other

## 2017-05-13 DIAGNOSIS — B20 Human immunodeficiency virus [HIV] disease: Secondary | ICD-10-CM | POA: Diagnosis not present

## 2017-05-13 LAB — CBC WITH DIFFERENTIAL/PLATELET
Basophils Absolute: 58 cells/uL (ref 0–200)
Basophils Relative: 1 %
EOS ABS: 128 {cells}/uL (ref 15–500)
Eosinophils Relative: 2.2 %
HCT: 43.2 % (ref 35.0–45.0)
HEMOGLOBIN: 15.3 g/dL (ref 11.7–15.5)
Lymphs Abs: 1786 cells/uL (ref 850–3900)
MCH: 33.2 pg — AB (ref 27.0–33.0)
MCHC: 35.4 g/dL (ref 32.0–36.0)
MCV: 93.7 fL (ref 80.0–100.0)
MONOS PCT: 8.6 %
MPV: 9.4 fL (ref 7.5–12.5)
Neutro Abs: 3329 cells/uL (ref 1500–7800)
Neutrophils Relative %: 57.4 %
PLATELETS: 250 10*3/uL (ref 140–400)
RBC: 4.61 10*6/uL (ref 3.80–5.10)
RDW: 12.7 % (ref 11.0–15.0)
TOTAL LYMPHOCYTE: 30.8 %
WBC mixed population: 499 cells/uL (ref 200–950)
WBC: 5.8 10*3/uL (ref 3.8–10.8)

## 2017-05-13 LAB — LIPID PANEL
CHOL/HDL RATIO: 4.1 (calc) (ref <5.0)
CHOLESTEROL: 204 mg/dL — AB (ref <200)
HDL: 50 mg/dL — AB (ref >50)
LDL CHOLESTEROL (CALC): 125 mg/dL — AB
Non-HDL Cholesterol (Calc): 154 mg/dL (calc) — ABNORMAL HIGH (ref <130)
Triglycerides: 172 mg/dL — ABNORMAL HIGH (ref <150)

## 2017-05-13 LAB — BASIC METABOLIC PANEL
BUN: 14 mg/dL (ref 7–25)
CALCIUM: 9.9 mg/dL (ref 8.6–10.4)
CO2: 31 mmol/L (ref 20–32)
Chloride: 103 mmol/L (ref 98–110)
Creat: 0.9 mg/dL (ref 0.50–0.99)
GLUCOSE: 90 mg/dL (ref 65–99)
Potassium: 4 mmol/L (ref 3.5–5.3)
SODIUM: 140 mmol/L (ref 135–146)

## 2017-05-14 LAB — T-HELPER CELL (CD4) - (RCID CLINIC ONLY)
CD4 % Helper T Cell: 22 % — ABNORMAL LOW (ref 33–55)
CD4 T CELL ABS: 460 /uL (ref 400–2700)

## 2017-05-15 LAB — HIV-1 RNA QUANT-NO REFLEX-BLD
HIV 1 RNA Quant: <20 copies/mL — AB
HIV-1 RNA Quant, Log: <1.3 Log copies/mL — AB

## 2017-05-27 ENCOUNTER — Encounter: Payer: Self-pay | Admitting: Internal Medicine

## 2017-05-27 ENCOUNTER — Ambulatory Visit (INDEPENDENT_AMBULATORY_CARE_PROVIDER_SITE_OTHER): Payer: Medicare Other | Admitting: Internal Medicine

## 2017-05-27 VITALS — BP 147/89 | HR 85 | Temp 97.6°F | Ht 65.0 in | Wt 160.8 lb

## 2017-05-27 DIAGNOSIS — E7800 Pure hypercholesterolemia, unspecified: Secondary | ICD-10-CM | POA: Diagnosis not present

## 2017-05-27 DIAGNOSIS — J302 Other seasonal allergic rhinitis: Secondary | ICD-10-CM

## 2017-05-27 DIAGNOSIS — I10 Essential (primary) hypertension: Secondary | ICD-10-CM

## 2017-05-27 DIAGNOSIS — C50912 Malignant neoplasm of unspecified site of left female breast: Secondary | ICD-10-CM

## 2017-05-27 DIAGNOSIS — Z23 Encounter for immunization: Secondary | ICD-10-CM | POA: Diagnosis not present

## 2017-05-27 DIAGNOSIS — B20 Human immunodeficiency virus [HIV] disease: Secondary | ICD-10-CM | POA: Diagnosis not present

## 2017-05-27 MED ORDER — FLUNISOLIDE 25 MCG/ACT (0.025%) NA SOLN: 2.0000 | Freq: Two times a day (BID) | NASAL | 0 refills | Status: DC

## 2017-05-27 NOTE — Progress Notes (Signed)
RFV: follow up on hiv disease  Patient ID: [name], female   DOB: [birth year], 67 y.o.   MRN: [id number]  HPI [name] is a 67yo F with well controlled hiv disease, HTN, HLD, hx of breast ca, completed treatment in the end of 2018. Has surveillance mammo in June 2019.CD 4 count of 460/LV<20, on biktarvy. Having seasonal allergies. She underwent colonoscopy which found adenomatous polyp. Repeat in 5 yr. She reports having more energy  Sochx: husband passed away last year- getting adjusted, feeling more outgoing  Outpatient Encounter Medications as of 05/27/2017  Medication Sig  . amLODipine (NORVASC) 10 MG tablet Take 1 tablet (10 mg total) daily by mouth.  Marland Kitchen aspirin EC 81 MG tablet Take 81 mg by mouth daily.  . benazepril (LOTENSIN) 40 MG tablet TAKE ONE TABLET BY MOUTH EVERY DAY  . BIKTARVY 50-200-25 MG TABS tablet Take 1 tablet by mouth daily.  . Biotin 5000 MCG CAPS Take 5,000 mcg daily by mouth.  . Coenzyme Q10 300 MG CAPS Take 300 capsules by mouth daily.  . cyclobenzaprine (FLEXERIL) 10 MG tablet Take 1 tablet (10 mg total) by mouth at bedtime as needed.  . fexofenadine (ALLEGRA) 180 MG tablet Take 180 mg daily by mouth.  . gabapentin (NEURONTIN) 100 MG capsule Take 1 capsule (100 mg total) by mouth 2 (two) times daily.  . metoprolol tartrate (LOPRESSOR) 25 MG tablet TAKE 1/2 TABLET BY MOUTH TWICE DAILY  . Multiple Vitamin (MULTIVITAMIN WITH MINERALS) TABS tablet Take 1 tablet by mouth daily.  . Omega-3 Fatty Acids (FISH OIL) 1200 MG CAPS Take 1,200 mg by mouth 2 (two) times daily.    . rosuvastatin (CRESTOR) 5 MG tablet TAKE ONE TABLET BY MOUTH AT BEDTIME   Facility-Administered Encounter Medications as of 05/27/2017  Medication  . sodium chloride flush (NS) 0.9 % injection 10 mL     Patient Active Problem List   Diagnosis Date Noted  . Muscle spasms of neck 11/25/2016  . Drug-induced neutropenia ([place]) 04/23/2016  . Malignant neoplasm of upper-outer quadrant of left  breast in female, estrogen receptor negative ([place]) 12/11/2015  . Rash and nonspecific skin eruption 10/16/2015  . Hereditary and idiopathic peripheral neuropathy 07/16/2012  . Lumbar neuritis 07/16/2012  . Cervicalgia 07/16/2012  . URI 12/02/2006  . Human immunodeficiency virus (HIV) disease ([place]) 11/08/2005  . HYPERLIPIDEMIA 11/08/2005  . Essential hypertension 11/08/2005  . BRONCHITIS 11/08/2005  . Regional enteritis ([place]) 11/08/2005  . SYMPTOM, PAINFUL RESPIRATION 11/08/2005     Health Maintenance Due  Topic Date Due  . COLONOSCOPY  11/20/2001  . PAP SMEAR  12/17/2010  . DEXA SCAN  11/20/2016  . PNA vac Low Risk Adult (1 of 2 - PCV13) 11/20/2016    Social History   Tobacco Use  . Smoking status: Never Smoker  . Smokeless tobacco: Never Used  Substance Use Topics  . Alcohol use: Yes    Alcohol/week: 0.6 oz    Types: 1 Glasses of wine per week    Comment: on weekends   Review of Systems   Constitutional: Negative for fever, chills, diaphoresis, activity change, appetite change, fatigue and unexpected weight change.  HENT: Negative for congestion, sore throat, rhinorrhea, sneezing, trouble swallowing and sinus pressure.  Eyes: Negative for photophobia and visual disturbance.  Respiratory: Negative for cough, chest tightness, shortness of breath, wheezing and stridor.  Cardiovascular: Negative for chest pain, palpitations and leg swelling.  Gastrointestinal: Negative for nausea, vomiting, abdominal pain, diarrhea, constipation, blood in stool, abdominal  distention and anal bleeding.  Genitourinary: Negative for dysuria, hematuria, flank pain and difficulty urinating.  Musculoskeletal: Negative for myalgias, back pain, joint swelling, arthralgias and gait problem.  Skin: Negative for color change, pallor, rash and wound.  Neurological: Negative for dizziness, tremors, weakness and light-headedness.  Hematological: Negative for adenopathy. Does not bruise/bleed easily.    Psychiatric/Behavioral: Negative for behavioral problems, confusion, sleep disturbance, dysphoric mood, decreased concentration and agitation.    Physical Exam   Ht 5\' 5"  (1.651 m)   Wt 160 lb 12 oz (72.9 kg)   BMI 26.75 kg/m  Physical Exam  Constitutional:  oriented to person, place, and time. appears well-developed and well-nourished. No distress.  HENT: [place]/AT, PERRLA, no scleral icterus, facial muscle wasting Mouth/Throat: Oropharynx is clear and moist. No oropharyngeal exudate.  Cardiovascular: Normal rate, regular rhythm and normal heart sounds. Exam reveals no gallop and no friction rub.  No murmur heard.  Pulmonary/Chest: Effort normal and breath sounds normal. No respiratory distress.  has no wheezes.  Neck = supple, no nuchal rigidity Lymphadenopathy: no cervical adenopathy. No axillary adenopathy Neurological: alert and oriented to person, place, and time.  Skin: Skin is warm and dry. No rash noted. No erythema.  Psychiatric: a normal mood and affect.  behavior is normal.    Lab Results  Component Value Date   CD4TCELL 22 (L) 05/13/2017   Lab Results  Component Value Date   CD4TABS 460 05/13/2017   CD4TABS 430 02/05/2017   CD4TABS 420 10/08/2016   Lab Results  Component Value Date   HIV1RNAQUANT <20 DETECTED (A) 05/13/2017   Lab Results  Component Value Date   HEPBSAB No 03/24/2006   Lab Results  Component Value Date   LABRPR NON-REACTIVE 02/05/2017    CBC Lab Results  Component Value Date   WBC 5.8 05/13/2017   RBC 4.61 05/13/2017   HGB 15.3 05/13/2017   HCT 43.2 05/13/2017   PLT 250 05/13/2017   MCV 93.7 05/13/2017   MCH 33.2 (H) 05/13/2017   MCHC 35.4 05/13/2017   RDW 12.7 05/13/2017   LYMPHSABS 1,786 05/13/2017   MONOABS 0.7 12/31/2016   EOSABS 128 05/13/2017    BMET Lab Results  Component Value Date   NA 140 05/13/2017   K 4.0 05/13/2017   CL 103 05/13/2017   CO2 31 05/13/2017   GLUCOSE 90 05/13/2017   BUN 14 05/13/2017    CREATININE 0.90 05/13/2017   CALCIUM 9.9 05/13/2017   GFRNONAA 87 03/28/2016   GFRAA >89 03/28/2016      Assessment and Plan  hiv disease = well controlled. Continue on current regimen.  Seasonal allergies = will refill flunisonide nasal spray to help with symptoms, can use mucinex, and ibuprofen prn  HLD = fairly well controlled. Will not increase rosuvastatin dose at this time, she is sensitive to drug class in the past had side effects.   HTN = continue on lotensin and amlodipine  Breast ca, invasive ductal carcinoma, grade 3, estrogen and progesterone receptor negative = has upcoming surveillance mammogram in June. Follows up with Dr Jana Hakim  Health maintenance = will give prevnar 13 today then pneumovax at next appt

## 2017-05-30 ENCOUNTER — Telehealth: Payer: Self-pay | Admitting: Behavioral Health

## 2017-05-30 NOTE — Telephone Encounter (Signed)
Initiated PA through [place] [facility] for Flunisolide.  Sent to plan 05/30/2017 awaiting response and it states a decision will be made within the next 3 business days.  Confirmation [place] RN

## 2017-06-04 ENCOUNTER — Other Ambulatory Visit: Payer: Self-pay | Admitting: Pharmacist Clinician (PhC)/ Clinical Pharmacy Specialist

## 2017-06-04 MED ORDER — FLUTICASONE PROPIONATE 50 MCG/ACT NA SUSP: 2.0000 | Freq: Every day | NASAL | 5 refills | Status: DC

## 2017-06-04 NOTE — Progress Notes (Signed)
[employer] will not cover flunisolide. We put her on that because of the interaction issue with cobicistat. She is now on Biktarvy so will change to Flonase instead. She is ok with it.

## 2017-06-05 ENCOUNTER — Telehealth: Payer: Self-pay

## 2017-06-05 NOTE — Telephone Encounter (Signed)
Patient called stating that she has increased sinus drainage that started two weeks ago. She also stated she has a sore throat that started about four days ago. No fever noted at this time. Also experiencing some increased fatigue. Patient states she has been taking allegra and the nasal spray that was prescribed. Please advise. Pola Corn, LPN

## 2017-06-16 ENCOUNTER — Other Ambulatory Visit (HOSPITAL_COMMUNITY): Payer: Self-pay | Admitting: Internal Medicine

## 2017-06-16 ENCOUNTER — Other Ambulatory Visit: Payer: Self-pay | Admitting: Internal Medicine

## 2017-06-16 DIAGNOSIS — B20 Human immunodeficiency virus [HIV] disease: Secondary | ICD-10-CM

## 2017-06-26 ENCOUNTER — Telehealth: Payer: Self-pay | Admitting: *Deleted

## 2017-06-26 ENCOUNTER — Ambulatory Visit (INDEPENDENT_AMBULATORY_CARE_PROVIDER_SITE_OTHER): Payer: Medicare Other | Admitting: Infectious Diseases

## 2017-06-26 ENCOUNTER — Encounter: Payer: Self-pay | Admitting: Infectious Diseases

## 2017-06-26 VITALS — BP 130/85 | HR 86 | Temp 98.2°F | Ht 65.0 in | Wt 161.0 lb

## 2017-06-26 DIAGNOSIS — B370 Candidal stomatitis: Secondary | ICD-10-CM | POA: Diagnosis not present

## 2017-06-26 DIAGNOSIS — R682 Dry mouth, unspecified: Secondary | ICD-10-CM

## 2017-06-26 DIAGNOSIS — R631 Polydipsia: Secondary | ICD-10-CM

## 2017-06-26 MED ORDER — FLUCONAZOLE 200 MG PO TABS: 200.0000 mg | ORAL_TABLET | Freq: Every day | ORAL | 0 refills | Status: DC

## 2017-06-26 NOTE — Telephone Encounter (Signed)
Patient called, asked if she could see any provider today. She has had a sinus infection for the last 2 weeks, has been treating those symptoms.  She has developed painful ulcers in her mouth/throat and has a white coating on her tongue. She would like to be seen for thrush evaluation/treatment. She will come today at 10:30 to see Janene Madeira, NP. Landis Gandy, RN

## 2017-06-26 NOTE — Assessment & Plan Note (Signed)
May be related to allegra considering eye and mouth involvement. Advised to continue OTC moisturizing opthalmic gtts. Advised Biotene rinse/mouth gel/lozenges to help with dryness once thrush is resolved. If this is ongoing despite the above and labs are unrevealing I also considered Sjogren's. We decided to defer these labs as she is not certain what insurance will cover. Exam unrevealing - no parotiditis and eyes are normal.  Recommended dental and ophthalmology evaluation as she is over due for both.

## 2017-06-26 NOTE — Progress Notes (Signed)
Name: [name]  DOB: 01-29-1951 MRN: [id number] PCP: Carlyle Basques, MD   Patient Active Problem List   Diagnosis Date Noted  . Oral thrush 06/26/2017  . Dry mouth & dry eyes  06/26/2017  . Muscle spasms of neck 11/25/2016  . Drug-induced neutropenia ([place]) 04/23/2016  . Malignant neoplasm of upper-outer quadrant of left breast in female, estrogen receptor negative ([place]) 12/11/2015  . Hereditary and idiopathic peripheral neuropathy 07/16/2012  . Lumbar neuritis 07/16/2012  . Cervicalgia 07/16/2012  . URI 12/02/2006  . Human immunodeficiency virus (HIV) disease ([place]) 11/08/2005  . HYPERLIPIDEMIA 11/08/2005  . Essential hypertension 11/08/2005  . BRONCHITIS 11/08/2005  . Regional enteritis ([place]) 11/08/2005  . SYMPTOM, PAINFUL RESPIRATION 11/08/2005     Subjective:   Chief Complaint  Patient presents with  . Oral Pain    yeast infection on her tongue    [name] is here today for a sick visit. She is a patient of Dr. Storm Frisk and has very well maintained HIV on Biktarvy with most recent VL undetectable and CD4 460 one month ago. She is here today as she has a "yeast infection" on her tongue and painful/dry mouth. One month ago saw Dr. Baxter Flattery with complaints of sinus drainage. She was recommended allegra and nasal spray. Now the drainage is decreased and intermittent which she is pleased with. However over the last week had worsened pain in her mouth and "hard dry tongue". She has had oral thrush in the past while on chemotherapy and once early on with HIV diagnosis and feels that this is the same thing. Diflucan in the past has helped her. She wonders if this is due to dehydration as she is always very thirsty and urinates a lot. She has been having some night sweats about a month ago. She feels as if "everything is dry". Urinates frequently. Does have night sweats occasionally.   Review of Systems  All other systems reviewed and are negative.   Past Medical  History:  Diagnosis Date  . HIV (human immunodeficiency virus infection) ([place])   . Hypertension   . Malignant neoplasm of upper-outer quadrant of left female breast ([place]) 12/11/2015  . Migraine   . Neuropathy   . Personal history of chemotherapy 2018  . Personal history of radiation therapy 2018  . PONV (postoperative nausea and vomiting)    said usually has to have scop patch  . Rash and nonspecific skin eruption 10/16/2015  . Spinal stenosis     Outpatient Medications Prior to Visit  Medication Sig Dispense Refill  . amLODipine (NORVASC) 10 MG tablet TAKE ONE TABLET BY MOUTH EVERY DAY 30 tablet 3  . aspirin EC 81 MG tablet Take 81 mg by mouth daily.    . benazepril (LOTENSIN) 40 MG tablet TAKE ONE TABLET BY MOUTH EVERY DAY 30 tablet 5  . BIKTARVY 50-200-25 MG TABS tablet Take 1 tablet by mouth daily. 30 tablet 5  . Biotin 5000 MCG CAPS Take 5,000 mcg daily by mouth.    . Coenzyme Q10 300 MG CAPS Take 300 capsules by mouth daily.    . fexofenadine (ALLEGRA) 180 MG tablet Take 180 mg daily by mouth.    . fluticasone (FLONASE) 50 MCG/ACT nasal spray Place 2 sprays into both nostrils daily. 16 g 5  . gabapentin (NEURONTIN) 100 MG capsule Take 1 capsule (100 mg total) by mouth 2 (two) times daily. 180 capsule 1  . metoprolol tartrate (LOPRESSOR) 25 MG tablet TAKE 1/2 TABLET BY  MOUTH TWICE DAILY 30 tablet 3  . Multiple Vitamin (MULTIVITAMIN WITH MINERALS) TABS tablet Take 1 tablet by mouth daily.    . Omega-3 Fatty Acids (FISH OIL) 1200 MG CAPS Take 1,200 mg by mouth 2 (two) times daily.      . rosuvastatin (CRESTOR) 5 MG tablet TAKE ONE TABLET BY MOUTH AT BEDTIME 30 tablet 5  . cyclobenzaprine (FLEXERIL) 10 MG tablet Take 1 tablet (10 mg total) by mouth at bedtime as needed. 30 tablet 5  . polyethylene glycol (MIRALAX / GLYCOLAX) packet Take 17 g by mouth daily.     Facility-Administered Medications Prior to Visit  Medication Dose Route Frequency Provider Last Rate Last Dose  . sodium  chloride flush (NS) 0.9 % injection 10 mL  10 mL Intravenous PRN Magrinat, Virgie Dad, MD   10 mL at 05/21/16 1022     No Known Allergies  Social History   Tobacco Use  . Smoking status: Never Smoker  . Smokeless tobacco: Never Used  Substance Use Topics  . Alcohol use: Yes    Alcohol/week: 0.6 oz    Types: 1 Glasses of wine per week    Comment: on weekends  . Drug use: No    Family History  Problem Relation Age of Onset  . High blood pressure Mother   . High Cholesterol Mother   . Cancer Father   . High blood pressure Father   . High Cholesterol Father   . Stomach cancer Father        intestinal  . Lung cancer Maternal Grandfather   . Breast cancer Maternal Aunt 38    Social History   Substance and Sexual Activity  Sexual Activity Not Currently  . Partners: Male   Comment: declined condoms; husband passed 05/2016     Objective:   Vitals:   06/26/17 1043  BP: 130/85  Pulse: 86  Temp: 98.2 F (36.8 C)  TempSrc: Oral  Weight: 161 lb (73 kg)  Height: 5\' 5"  (1.651 m)   Body mass index is 26.79 kg/m.  Physical Exam  Constitutional: She is oriented to person, place, and time. She appears well-developed and well-nourished.  HENT:  Mouth/Throat: Oropharyngeal exudate (white coating on tongue, erythematous hard palate) present.  Eyes: Pupils are equal, round, and reactive to light. Conjunctivae are normal. Right eye exhibits no discharge. Left eye exhibits no discharge.  Cardiovascular: Normal rate, regular rhythm and normal heart sounds.  Pulmonary/Chest: Effort normal and breath sounds normal. No respiratory distress.  Abdominal: Soft.  Neurological: She is alert and oriented to person, place, and time.  Skin: Skin is warm and dry.  Psychiatric: She has a normal mood and affect. Thought content normal.  Vitals reviewed.   Lab Results Lab Results  Component Value Date   WBC 5.8 05/13/2017   HGB 15.3 05/13/2017   HCT 43.2 05/13/2017   MCV 93.7 05/13/2017    PLT 250 05/13/2017    Lab Results  Component Value Date   CREATININE 0.90 05/13/2017   BUN 14 05/13/2017   NA 140 05/13/2017   K 4.0 05/13/2017   CL 103 05/13/2017   CO2 31 05/13/2017    Lab Results  Component Value Date   ALT 28 12/31/2016   AST 31 12/31/2016   ALKPHOS 80 12/31/2016   BILITOT 0.40 12/31/2016    Lab Results  Component Value Date   CHOL 204 (H) 05/13/2017   HDL 50 (L) 05/13/2017   LDLCALC 125 (H) 05/13/2017   TRIG 172 (  H) 05/13/2017   CHOLHDL 4.1 05/13/2017   HIV 1 RNA Quant (copies/mL)  Date Value  05/13/2017 <20 DETECTED (A)  02/05/2017 <20 NOT DETECTED  10/08/2016 <20 DETECTED (A)   CD4 T Cell Abs (/uL)  Date Value  05/13/2017 460  02/05/2017 430  10/08/2016 420     Assessment & Plan:   Problem List Items Addressed This Visit      Digestive   Oral thrush    Exam c/w oral thrush. She demonstrates odonyphagia but no dysphagia or difficulty getting pills or foods down. Will treat with diflucan 200 mg QD x 7d.   Unusual she would have presenting thrush with preserved CD4 count and last chemo 1 year ago. With her polydipsia and polyuria I will screen her for diabetes today with hemoglobin A1C and spot blood sugar on BMET. Will also assess urinalysis to check for glycosuria and ketones.       Relevant Medications   fluconazole (DIFLUCAN) 200 MG tablet   Dry mouth & dry eyes     May be related to allegra considering eye and mouth involvement. Advised to continue OTC moisturizing opthalmic gtts. Advised Biotene rinse/mouth gel/lozenges to help with dryness once thrush is resolved. If this is ongoing despite the above and labs are unrevealing I also considered Sjogren's. We decided to defer these labs as she is not certain what insurance will cover. Exam unrevealing - no parotiditis and eyes are normal.  Recommended dental and ophthalmology evaluation as she is over due for both.         Other Visit Diagnoses    Polydipsia    -  Primary    Relevant Orders   Urinalysis   Hemoglobin G4W   Basic metabolic panel   CBC      Janene Madeira, MSN, NP-C [facility] [place] Pager: [phone] Office: [phone]  06/26/17  1:41 PM

## 2017-06-26 NOTE — Assessment & Plan Note (Signed)
Exam c/w oral thrush. She demonstrates odonyphagia but no dysphagia or difficulty getting pills or foods down. Will treat with diflucan 200 mg QD x 7d.   Unusual she would have presenting thrush with preserved CD4 count and last chemo 1 year ago. With her polydipsia and polyuria I will screen her for diabetes today with hemoglobin A1C and spot blood sugar on BMET. Will also assess urinalysis to check for glycosuria and ketones.

## 2017-06-26 NOTE — Patient Instructions (Addendum)
Diflucan for your thrush for a week  We have several tests to check results for - will call you with these.   Continue your water intake.   Biotin mouth rinse and moisturizer gel to help.

## 2017-06-27 LAB — BASIC METABOLIC PANEL
BUN: 14 mg/dL (ref 7–25)
CALCIUM: 10.4 mg/dL (ref 8.6–10.4)
CO2: 27 mmol/L (ref 20–32)
Chloride: 104 mmol/L (ref 98–110)
Creat: 0.8 mg/dL (ref 0.50–0.99)
GLUCOSE: 93 mg/dL (ref 65–99)
Potassium: 4.3 mmol/L (ref 3.5–5.3)
SODIUM: 141 mmol/L (ref 135–146)

## 2017-06-27 LAB — CBC
HEMATOCRIT: 44.5 % (ref 35.0–45.0)
HEMOGLOBIN: 15.7 g/dL — AB (ref 11.7–15.5)
MCH: 32.3 pg (ref 27.0–33.0)
MCHC: 35.3 g/dL (ref 32.0–36.0)
MCV: 91.6 fL (ref 80.0–100.0)
MPV: 9.6 fL (ref 7.5–12.5)
Platelets: 282 10*3/uL (ref 140–400)
RBC: 4.86 10*6/uL (ref 3.80–5.10)
RDW: 12.5 % (ref 11.0–15.0)
WBC: 8 10*3/uL (ref 3.8–10.8)

## 2017-06-27 LAB — URINALYSIS
BILIRUBIN URINE: NEGATIVE
GLUCOSE, UA: NEGATIVE
Hgb urine dipstick: NEGATIVE
KETONES UR: NEGATIVE
Nitrite: NEGATIVE
PH: 5.5 (ref 5.0–8.0)
Protein, ur: NEGATIVE
SPECIFIC GRAVITY, URINE: 1.008 (ref 1.001–1.03)

## 2017-06-27 LAB — HEMOGLOBIN A1C
Hgb A1c MFr Bld: 5.3 % of total Hgb (ref <5.7)
Mean Plasma Glucose: 105 (calc)
eAG (mmol/L): 5.8 (calc)

## 2017-06-30 ENCOUNTER — Telehealth: Payer: Self-pay | Admitting: Infectious Diseases

## 2017-07-01 ENCOUNTER — Other Ambulatory Visit: Payer: Medicare Other

## 2017-07-01 ENCOUNTER — Ambulatory Visit: Payer: Medicare Other | Admitting: Oncology

## 2017-07-15 ENCOUNTER — Other Ambulatory Visit: Payer: Self-pay | Admitting: Internal Medicine

## 2017-07-25 ENCOUNTER — Other Ambulatory Visit: Payer: Self-pay | Admitting: Emergency Medicine

## 2017-07-25 DIAGNOSIS — Z171 Estrogen receptor negative status [ER-]: Principal | ICD-10-CM

## 2017-07-25 DIAGNOSIS — C50412 Malignant neoplasm of upper-outer quadrant of left female breast: Secondary | ICD-10-CM

## 2017-07-28 ENCOUNTER — Inpatient Hospital Stay: Payer: Medicare Other | Attending: Oncology

## 2017-07-28 ENCOUNTER — Inpatient Hospital Stay (HOSPITAL_BASED_OUTPATIENT_CLINIC_OR_DEPARTMENT_OTHER): Payer: Medicare Other | Admitting: Adult Health

## 2017-07-28 ENCOUNTER — Encounter: Payer: Self-pay | Admitting: Adult Health

## 2017-07-28 ENCOUNTER — Telehealth: Payer: Self-pay

## 2017-07-28 VITALS — BP 134/79 | HR 80 | Temp 97.9°F | Resp 20 | Ht 65.0 in | Wt 159.3 lb

## 2017-07-28 DIAGNOSIS — Z21 Asymptomatic human immunodeficiency virus [HIV] infection status: Secondary | ICD-10-CM

## 2017-07-28 DIAGNOSIS — Z853 Personal history of malignant neoplasm of breast: Secondary | ICD-10-CM

## 2017-07-28 DIAGNOSIS — Z171 Estrogen receptor negative status [ER-]: Principal | ICD-10-CM

## 2017-07-28 DIAGNOSIS — C50412 Malignant neoplasm of upper-outer quadrant of left female breast: Secondary | ICD-10-CM

## 2017-07-28 LAB — CMP (CANCER CENTER ONLY)
ALT: 34 U/L (ref 0–44)
AST: 35 U/L (ref 15–41)
Albumin: 4.7 g/dL (ref 3.5–5.0)
Alkaline Phosphatase: 99 U/L (ref 38–126)
Anion gap: 8 (ref 5–15)
BILIRUBIN TOTAL: 0.5 mg/dL (ref 0.3–1.2)
BUN: 11 mg/dL (ref 8–23)
CHLORIDE: 104 mmol/L (ref 98–111)
CO2: 30 mmol/L (ref 22–32)
CREATININE: 1.02 mg/dL — AB (ref 0.44–1.00)
Calcium: 10.5 mg/dL — ABNORMAL HIGH (ref 8.9–10.3)
GFR, EST NON AFRICAN AMERICAN: 56 mL/min — AB (ref >60)
GFR, Est AFR Am: >60 mL/min (ref >60)
Glucose, Bld: 71 mg/dL (ref 70–99)
Potassium: 4.1 mmol/L (ref 3.5–5.1)
SODIUM: 142 mmol/L (ref 135–145)
TOTAL PROTEIN: 7.9 g/dL (ref 6.5–8.1)

## 2017-07-28 LAB — CBC WITH DIFFERENTIAL (CANCER CENTER ONLY)
Basophils Absolute: 0 10*3/uL (ref 0.0–0.1)
Basophils Relative: 1 %
EOS ABS: 0.1 10*3/uL (ref 0.0–0.5)
EOS PCT: 2 %
HCT: 44.5 % (ref 34.8–46.6)
Hemoglobin: 15.5 g/dL (ref 11.6–15.9)
LYMPHS ABS: 2 10*3/uL (ref 0.9–3.3)
Lymphocytes Relative: 34 %
MCH: 33.2 pg (ref 25.1–34.0)
MCHC: 34.8 g/dL (ref 31.5–36.0)
MCV: 95.3 fL (ref 79.5–101.0)
Monocytes Absolute: 0.6 10*3/uL (ref 0.1–0.9)
Monocytes Relative: 11 %
Neutro Abs: 3.1 10*3/uL (ref 1.5–6.5)
Neutrophils Relative %: 52 %
PLATELETS: 249 10*3/uL (ref 145–400)
RBC: 4.67 MIL/uL (ref 3.70–5.45)
RDW: 12.7 % (ref 11.2–14.5)
WBC: 5.8 10*3/uL (ref 3.9–10.3)

## 2017-07-28 NOTE — Telephone Encounter (Signed)
This RN called pt to review recent blood work.  VM did not identify patient's name therefore no msg was left.

## 2017-07-28 NOTE — Progress Notes (Signed)
[facility]  [phone] [phone]     ID: [name] DOB: September 01, 1950  MR#: [id number]  UVO#:[id number]  Patient Care Team: Carlyle Basques, MD as PCP - General (Infectious Diseases) Carlyle Basques, MD as PCP - Infectious Diseases (Infectious Diseases) Alphonsa Overall, MD as Consulting Physician (General Surgery) Magrinat, Virgie Dad, MD as Consulting Physician (Oncology) Gery Pray, MD as Consulting Physician (Radiation Oncology) Marcial Pacas, MD as Consulting Physician (Neurology) Druscilla Brownie, MD as Consulting Physician (Dermatology) Gardenia Phlegm, NP as Nurse Practitioner (Hematology and Oncology) Christianne Borrow, DMD (Dentistry) OTHER MD:  CHIEF COMPLAINT: Triple negative breast cancer  CURRENT TREATMENT: Observation   BREAST CANCER HISTORY:  From the origil intake note:  [name] herself palpated a mass in the upper outer quadrant of her left breast the first week in November, and had bilateral diagnostic mammography with tomography and left breast ultrasonography at the [facility] 12/05/2015. The breast density was category C. In the upper outer left breast there was  A mass with macro lobulated contours which was barely palpable at the 12:30 o'clock position of the left breast 7 cm from the nipple. There was no palpable axillary adenopathy. Ultrasonography confirmed a 2.1 cm obliquely oriented hypoechoic mass in the area in question. Ultrasound of the left axilla was benign.  Biopsy of the left breast mass in question 12/07/2015 showed ([id number]) invasive ductal carcinoma, grade 3, estrogen and progesterone receptor negative, with an MIB-1 of 35%, and no HER-2 amplification, the signals ratio being 0.95 and the number per cell 2.80.  Her subsequent history is as detailed below  INTERVAL HISTORY: [name] returns today for follow-up of her estrogen receptor negative breast cancer. She is doing well today.    She  continues to f/u with her PCP, along with Dr. Graylon Good.    She tells me that she is feeling well today and denies any pain.  She notes her left breast will occasionally feel warm after showering, but that it will subside shortly thereafter.         REVIEW OF SYSTEMS: [name] remains active.  She had some mild peripheral neuropathy she noted after she completed chemotherapy.  She notes it is only in her left first three fingers, and improves daily.  She denies any issues today such as headaches, vision changes, dysphagia, loss of appetite, weight loss, nausea, vomiting, constipation, diarrhea, chest pain, shortness of breath or palpitations.    A detailed ROS was non contributory today.    PAST MEDICAL HISTORY: Past Medical History:  Diagnosis Date  . HIV (human immunodeficiency virus infection) ([place])   . Hypertension   . Malignant neoplasm of upper-outer quadrant of left female breast ([place]) 12/11/2015  . Migraine   . Neuropathy   . Personal history of chemotherapy 2018  . Personal history of radiation therapy 2018  . PONV (postoperative nausea and vomiting)    said usually has to have scop patch  . Rash and nonspecific skin eruption 10/16/2015  . Spinal stenosis     PAST SURGICAL HISTORY: Past Surgical History:  Procedure Laterality Date  . ABDOMINAL HYSTERECTOMY  1990  . BREAST BIOPSY Left 12/07/2015  . BREAST LUMPECTOMY Left 01/01/2016  . BREAST LUMPECTOMY WITH RADIOACTIVE SEED AND SENTINEL LYMPH NODE BIOPSY Left 01/01/2016   Procedure: LEFT BREAST LUMPECTOMY WITH RADIOACTIVE SEED AND SENTINEL LYMPH NODE BIOPSY;  Surgeon: Alphonsa Overall, MD;  Location: [facility];  Service: General;  Laterality: Left;  . CARPAL TUNNEL RELEASE  both hands  . CERVICAL FUSION  1989  . PORTACATH PLACEMENT Right 01/01/2016   Procedure: INSERTION PORT-A-CATH WITH [name];  Surgeon: Alphonsa Overall, MD;  Location: [facility];  Service: General;  Laterality: Right;  . TONSILLECTOMY        FAMILY HISTORY Family History  Problem Relation Age of Onset  . High blood pressure Mother   . High Cholesterol Mother   . Cancer Father   . High blood pressure Father   . High Cholesterol Father   . Stomach cancer Father        intestinal  . Lung cancer Maternal Grandfather   . Breast cancer Maternal Aunt 39  The patient's father died from heart disease at the age of 18. The patient's mother died from "genetic cirrhosis" at the age of 14. The patient had 2 brothers, 5 sisters. A maternal aunt was diagnosed with breast cancer at age 25. There is no other history of breast or ovarian cancer in the family to the patient's knowledge   GYNECOLOGIC HISTORY:  No LMP recorded. Patient has had a hysterectomy. Menarche age 2, first live birth age 59. Patient is GX P1. She underwent hysterectomy with bilateral salpingo-oophorectomy at age 62. She took hormone replacement for approximately 10 years, until age 68. She also had oral contraceptives remotely for 8-10 years, with no complications.   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 She is currently not working. She now lives by herself. Her husband, [name] who retired from computer work, passed away in Jul 04, 2016. Their son [name] works as a Chief Strategy Officer for rest home in [place]. The patient has 3 grandchildren. She is not a Ambulance person.   ADVANCED DIRECTIVES: The patient has a living will in place   HEALTH MAINTENANCE: Social History   Tobacco Use  . Smoking status: Never Smoker  . Smokeless tobacco: Never Used  Substance Use Topics  . Alcohol use: Yes    Alcohol/week: 0.6 oz    Types: 1 Glasses of wine per week    Comment: on weekends  . Drug use: No     Colonoscopy: 2012/be routinely  PAP: Status post hysterectomy  Bone density: Never   No Known Allergies  Current Outpatient Medications  Medication Sig Dispense Refill  . amLODipine (NORVASC) 10 MG tablet TAKE ONE  TABLET BY MOUTH EVERY DAY 30 tablet 3  . aspirin EC 81 MG tablet Take 81 mg by mouth daily.    . benazepril (LOTENSIN) 40 MG tablet TAKE ONE TABLET BY MOUTH EVERY DAY 30 tablet 5  . BIKTARVY 50-200-25 MG TABS tablet Take 1 tablet by mouth daily. 30 tablet 5  . Biotin 5000 MCG CAPS Take 5,000 mcg daily by mouth.    . Coenzyme Q10 300 MG CAPS Take 300 capsules by mouth daily.    . cyclobenzaprine (FLEXERIL) 10 MG tablet Take 1 tablet (10 mg total) by mouth at bedtime as needed. 30 tablet 5  . fexofenadine (ALLEGRA) 180 MG tablet Take 180 mg daily by mouth.    . gabapentin (NEURONTIN) 100 MG capsule Take 1 capsule (100 mg total) by mouth 2 (two) times daily. 180 capsule 1  . metoprolol tartrate (LOPRESSOR) 25 MG tablet TAKE 1/2 TABLET BY MOUTH TWICE DAILY 30 tablet 3  . Multiple Vitamin (MULTIVITAMIN WITH MINERALS) TABS tablet Take 1 tablet by mouth daily.    . Omega-3 Fatty Acids (FISH OIL) 1200 MG CAPS Take 1,200 mg by mouth  2 (two) times daily.      . polyethylene glycol (MIRALAX / GLYCOLAX) packet Take 17 g by mouth daily.    . rosuvastatin (CRESTOR) 5 MG tablet TAKE ONE TABLET BY MOUTH AT BEDTIME 30 tablet 5   No current facility-administered medications for this visit.    Facility-Administered Medications Ordered in Other Visits  Medication Dose Route Frequency Provider Last Rate Last Dose  . sodium chloride flush (NS) 0.9 % injection 10 mL  10 mL Intravenous PRN Magrinat, Virgie Dad, MD   10 mL at 05/21/16 1022    OBJECTIVE:  Vitals:   07/28/17 0934  BP: 134/79  Pulse: 80  Resp: 20  Temp: 97.9 F (36.6 C)  SpO2: 100%     Body mass index is 26.51 kg/m.    ECOG FS:0 - Asymptomatic  GENERAL: Patient is a well appearing female in no acute distress HEENT:  Sclerae anicteric.  Oropharynx clear and moist. No ulcerations or evidence of oropharyngeal candidiasis. Neck is supple.  NODES:  No cervical, supraclavicular, or axillary lymphadenopathy palpated.  BREAST EXAM:  Right breast is  without nodules, masses, skin or nipple changes, left breast s/p lumpectomy, radiation changes noted to the breast, slight warmth, no swelling noted, no erythema noted (patient states she showered before coming here).   LUNGS:  Clear to auscultation bilaterally.  No wheezes or rhonchi. HEART:  Regular rate and rhythm. No murmur appreciated. ABDOMEN:  Soft, nontender.  Positive, normoactive bowel sounds. No organomegaly palpated. MSK:  No focal spinal tenderness to palpation. Full range of motion bilaterally in the upper extremities. EXTREMITIES:  No peripheral edema.   SKIN:  Clear with no obvious rashes or skin changes. No nail dyscrasia. NEURO:  Nonfocal. Well oriented.  Appropriate affect.     LAB RESULTS:  CMP     Component Value Date/Time   NA 141 06/26/2017 1159   NA 140 12/31/2016 1059   K 4.3 06/26/2017 1159   K 3.9 12/31/2016 1059   CL 104 06/26/2017 1159   CO2 27 06/26/2017 1159   CO2 28 12/31/2016 1059   GLUCOSE 93 06/26/2017 1159   GLUCOSE 75 12/31/2016 1059   BUN 14 06/26/2017 1159   BUN 11.3 12/31/2016 1059   CREATININE 0.80 06/26/2017 1159   CREATININE 0.9 12/31/2016 1059   CALCIUM 10.4 06/26/2017 1159   CALCIUM 9.6 12/31/2016 1059   PROT 7.3 12/31/2016 1059   ALBUMIN 4.2 12/31/2016 1059   AST 31 12/31/2016 1059   ALT 28 12/31/2016 1059   ALKPHOS 80 12/31/2016 1059   BILITOT 0.40 12/31/2016 1059   GFRNONAA 87 03/28/2016 1139   GFRAA >89 03/28/2016 1139    INo results found for: SPEP, UPEP  Lab Results  Component Value Date   WBC 8.0 06/26/2017   NEUTROABS 3,329 05/13/2017   HGB 15.7 (H) 06/26/2017   HCT 44.5 06/26/2017   MCV 91.6 06/26/2017   PLT 282 06/26/2017      Chemistry      Component Value Date/Time   NA 141 06/26/2017 1159   NA 140 12/31/2016 1059   K 4.3 06/26/2017 1159   K 3.9 12/31/2016 1059   CL 104 06/26/2017 1159   CO2 27 06/26/2017 1159   CO2 28 12/31/2016 1059   BUN 14 06/26/2017 1159   BUN 11.3 12/31/2016 1059    CREATININE 0.80 06/26/2017 1159   CREATININE 0.9 12/31/2016 1059      Component Value Date/Time   CALCIUM 10.4 06/26/2017 1159   CALCIUM 9.6  12/31/2016 1059   ALKPHOS 80 12/31/2016 1059   AST 31 12/31/2016 1059   ALT 28 12/31/2016 1059   BILITOT 0.40 12/31/2016 1059       No results found for: LABCA2  No components found for: LABCA125  No results for input(s): INR in the last 168 hours.  Urinalysis    Component Value Date/Time   COLORURINE YELLOW 06/26/2017 [place] 06/26/2017 1205   LABSPEC 1.008 06/26/2017 1205   PHURINE 5.5 06/26/2017 1205   GLUCOSEU NEGATIVE 06/26/2017 1205   HGBUR NEGATIVE 06/26/2017 1205   BILIRUBINUR NEGATIVE 03/21/2007 1108   [place] 06/26/2017 [place] 06/26/2017 1205   UROBILINOGEN 1.0 03/21/2007 1108   NITRITE NEGATIVE 06/26/2017 1205   LEUKOCYTESUR TRACE (A) 06/26/2017 1205     STUDIES:    ELIGIBLE FOR AVAILABLE RESEARCH PROTOCOL: no  ASSESSMENT: 67 y.o. [place] woman status post left breast upper outer quadrant biopsy 12/07/2015 for a clinical T2 N0, stage IIA invasive ductal carcinoma, grade 3, triple negative, with an MIB-1 of 35%  (1) genetics testing 01/23/2016  (a) alpha -1- anti trypsin and ceruloplamin labs drawn 01/19/2016, both normal  (2) Status post left lumpectomy/ sentinel lymph node sampling 01/01/2016 for a pT1c pN0, stage IA  Invasive ductal carcinoma, grade 3, with negative margins.  (3) Mammaprint sent from the patient's 12/07/2015 biopsy returned "high risk", predicting a risk of recurrence of nearly 30% untreated, decreasing to a 93% chance of five-year distant metastasis free survival with chemotherapy.  (4) adjuvant chemotherapy consisting of cyclophosphamide and doxorubicin in dose dense fashion 4 started 02/01/2016, completed 03/12/2016, followed by weekly Abraxane 12 starting 04/02/2016,  (a) changed on 04/30/16 to Abraxane days 1,8,15, with Neulasta support on  day 16 of each 28 day cycle,   (b) then changed on 4/17 to Abraxane days 1, 8, on a 21 day cycle with Neulasta on day 9 due to neutropenia  (c) taxanes stopped after 9 doses due to neutropenia, last dose 06/18/2016  (5) adjuvant radiation 07/29/2016-09/12/2016 Site/dose:   1.) Breast, Left 1.8 Gy in 28 fractions                         2.) Breast, Left Boost 2 Gy in 5 fractions   (6) HIV positivity: Followed by Dr. Graylon Good. June 2018 labs show a T4 abs 450, percentage 31%, HIV RNA undetectable  PLAN: [name] is doing well today.  She is clinically and radiographically without signs of recurrence.  Her labs are pending today at the completion of the visit, so we will have to call her with the results.  [name] is due for a repeat diagnostic breast mammogram in 11/2017; orders placed today.  Her breast continues to recover due to radiation changes in the breast.  She knows to call if she notes anything new or concerning in the breast.  We reviewed health maintenance in detail. I continued to encourage healthy diet, and exercise 30 minutes most days of the week/150 minutes per week.    I recommended she continue with colon cancer screening, skin cancer screening, gyn screening (if indicated by PCP or GYN), and continue to have annual physicals with PCP, and continued care with Dr. Graylon Good.    [name] verbalizes understanding of the above plan.  Dr. Jana Hakim will see her back in 6 months.  She knows to call for any other problems that may develop before the next visit.  A total of (30)  minutes of face-to-face time was spent with this patient with greater than 50% of that time in counseling and care-coordination.  Wilber Bihari, NP 07/28/17 9:38 AM Medical Oncology and H[facility] [address] [place], [place] [address] Tel. [phone]    Fax. [phone]

## 2017-07-30 NOTE — Telephone Encounter (Signed)
rerror

## 2017-08-06 ENCOUNTER — Other Ambulatory Visit (HOSPITAL_COMMUNITY): Payer: Self-pay | Admitting: Internal Medicine

## 2017-08-11 ENCOUNTER — Other Ambulatory Visit: Payer: Self-pay | Admitting: *Deleted

## 2017-08-11 ENCOUNTER — Other Ambulatory Visit: Payer: Medicare Other

## 2017-08-11 DIAGNOSIS — B20 Human immunodeficiency virus [HIV] disease: Secondary | ICD-10-CM

## 2017-08-11 LAB — CBC WITH DIFFERENTIAL/PLATELET
BASOS PCT: 0.7 %
Basophils Absolute: 41 cells/uL (ref 0–200)
EOS ABS: 83 {cells}/uL (ref 15–500)
Eosinophils Relative: 1.4 %
HCT: 41.8 % (ref 35.0–45.0)
HEMOGLOBIN: 14.7 g/dL (ref 11.7–15.5)
Lymphs Abs: 2077 cells/uL (ref 850–3900)
MCH: 32.5 pg (ref 27.0–33.0)
MCHC: 35.2 g/dL (ref 32.0–36.0)
MCV: 92.3 fL (ref 80.0–100.0)
MPV: 9.8 fL (ref 7.5–12.5)
Monocytes Relative: 9.9 %
Neutro Abs: 3115 cells/uL (ref 1500–7800)
Neutrophils Relative %: 52.8 %
PLATELETS: 261 10*3/uL (ref 140–400)
RBC: 4.53 10*6/uL (ref 3.80–5.10)
RDW: 12.5 % (ref 11.0–15.0)
TOTAL LYMPHOCYTE: 35.2 %
WBC: 5.9 10*3/uL (ref 3.8–10.8)
WBCMIX: 584 {cells}/uL (ref 200–950)

## 2017-08-11 LAB — COMPLETE METABOLIC PANEL WITH GFR
AG Ratio: 2 (calc) (ref 1.0–2.5)
ALT: 24 U/L (ref 6–29)
AST: 33 U/L (ref 10–35)
Albumin: 4.9 g/dL (ref 3.6–5.1)
Alkaline phosphatase (APISO): 85 U/L (ref 33–130)
BILIRUBIN TOTAL: 0.6 mg/dL (ref 0.2–1.2)
BUN: 9 mg/dL (ref 7–25)
CALCIUM: 9.9 mg/dL (ref 8.6–10.4)
CO2: 28 mmol/L (ref 20–32)
CREATININE: 0.98 mg/dL (ref 0.50–0.99)
Chloride: 100 mmol/L (ref 98–110)
GFR, EST NON AFRICAN AMERICAN: 61 mL/min/{1.73_m2} (ref >=60)
GFR, Est African American: 70 mL/min/{1.73_m2} (ref >=60)
GLUCOSE: 87 mg/dL (ref 65–99)
Globulin: 2.5 g/dL (calc) (ref 1.9–3.7)
Potassium: 3.9 mmol/L (ref 3.5–5.3)
SODIUM: 138 mmol/L (ref 135–146)
TOTAL PROTEIN: 7.4 g/dL (ref 6.1–8.1)

## 2017-08-12 LAB — T-HELPER CELL (CD4) - (RCID CLINIC ONLY)
CD4 T CELL HELPER: 24 % — AB (ref 33–55)
CD4 T Cell Abs: 470 /uL (ref 400–2700)

## 2017-08-13 LAB — HIV-1 RNA QUANT-NO REFLEX-BLD
HIV 1 RNA QUANT: 82 {copies}/mL — AB
HIV-1 RNA QUANT, LOG: 1.91 {Log_copies}/mL — AB

## 2017-08-25 ENCOUNTER — Ambulatory Visit (INDEPENDENT_AMBULATORY_CARE_PROVIDER_SITE_OTHER): Payer: Medicare Other | Admitting: Internal Medicine

## 2017-08-25 ENCOUNTER — Encounter: Payer: Self-pay | Admitting: Internal Medicine

## 2017-08-25 VITALS — BP 144/79 | HR 85 | Temp 98.0°F | Wt 160.0 lb

## 2017-08-25 DIAGNOSIS — I10 Essential (primary) hypertension: Secondary | ICD-10-CM | POA: Diagnosis not present

## 2017-08-25 DIAGNOSIS — E7800 Pure hypercholesterolemia, unspecified: Secondary | ICD-10-CM

## 2017-08-25 DIAGNOSIS — B20 Human immunodeficiency virus [HIV] disease: Secondary | ICD-10-CM | POA: Diagnosis not present

## 2017-08-25 DIAGNOSIS — Z79899 Other long term (current) drug therapy: Secondary | ICD-10-CM

## 2017-08-25 NOTE — Progress Notes (Signed)
RFV: follow up for hiv disease  Patient ID: [name], female   DOB: 08-17-1950, 67 y.o.   MRN: [id number]  HPI Jazari is a 67yo F with hiv disease, hx of breast ca in 2018-2019, Cd 4 count of 470/VL 85. On biktarvy. She was seen in June for thrush by Janene Madeira, our NP. She is doing overall very well. No longer has thrush. Getting some of her weight back.  Outpatient Encounter Medications as of 08/25/2017  Medication Sig  . amLODipine (NORVASC) 10 MG tablet TAKE ONE TABLET BY MOUTH EVERY DAY  . aspirin EC 81 MG tablet Take 81 mg by mouth daily.  . benazepril (LOTENSIN) 40 MG tablet TAKE ONE TABLET BY MOUTH EVERY DAY  . BIKTARVY 50-200-25 MG TABS tablet Take 1 tablet by mouth daily.  . Biotin 5000 MCG CAPS Take 5,000 mcg daily by mouth.  . Coenzyme Q10 300 MG CAPS Take 300 capsules by mouth daily.  . cyclobenzaprine (FLEXERIL) 10 MG tablet Take 1 tablet (10 mg total) by mouth at bedtime as needed.  . fexofenadine (ALLEGRA) 180 MG tablet Take 180 mg daily by mouth.  . gabapentin (NEURONTIN) 100 MG capsule Take 1 capsule (100 mg total) by mouth 2 (two) times daily.  . metoprolol tartrate (LOPRESSOR) 25 MG tablet TAKE 1/2 TABLET BY MOUTH TWICE DAILY  . Multiple Vitamin (MULTIVITAMIN WITH MINERALS) TABS tablet Take 1 tablet by mouth daily.  . Omega-3 Fatty Acids (FISH OIL) 1200 MG CAPS Take 1,200 mg by mouth 2 (two) times daily.    . polyethylene glycol (MIRALAX / GLYCOLAX) packet Take 17 g by mouth daily.  . rosuvastatin (CRESTOR) 5 MG tablet TAKE ONE TABLET BY MOUTH AT BEDTIME   Facility-Administered Encounter Medications as of 08/25/2017  Medication  . sodium chloride flush (NS) 0.9 % injection 10 mL     Patient Active Problem List   Diagnosis Date Noted  . Oral thrush 06/26/2017  . Dry mouth & dry eyes  06/26/2017  . Muscle spasms of neck 11/25/2016  . Drug-induced neutropenia ([place]) 04/23/2016  . Malignant neoplasm of upper-outer quadrant of left breast in female,  estrogen receptor negative ([place]) 12/11/2015  . Hereditary and idiopathic peripheral neuropathy 07/16/2012  . Lumbar neuritis 07/16/2012  . Cervicalgia 07/16/2012  . URI 12/02/2006  . Human immunodeficiency virus (HIV) disease ([place]) 11/08/2005  . HYPERLIPIDEMIA 11/08/2005  . Essential hypertension 11/08/2005  . BRONCHITIS 11/08/2005  . Regional enteritis ([place]) 11/08/2005  . SYMPTOM, PAINFUL RESPIRATION 11/08/2005     Health Maintenance Due  Topic Date Due  . COLONOSCOPY  11/20/2001  . PAP SMEAR  12/17/2010  . DEXA SCAN  11/20/2016    Social History   Tobacco Use  . Smoking status: Never Smoker  . Smokeless tobacco: Never Used  Substance Use Topics  . Alcohol use: Yes    Alcohol/week: 0.6 oz    Types: 1 Glasses of wine per week    Comment: on weekends  . Drug use: No   Review of Systems Review of Systems  Constitutional: Negative for fever, chills, diaphoresis, activity change, appetite change, fatigue and unexpected weight change.  HENT: Negative for congestion, sore throat, rhinorrhea, sneezing, trouble swallowing and sinus pressure.  Eyes: Negative for photophobia and visual disturbance.  Respiratory: Negative for cough, chest tightness, shortness of breath, wheezing and stridor.  Cardiovascular: Negative for chest pain, palpitations and leg swelling.  Gastrointestinal: Negative for nausea, vomiting, abdominal pain, diarrhea, constipation, blood in stool, abdominal distention and anal bleeding.  Genitourinary: Negative for dysuria, hematuria, flank pain and difficulty urinating.  Musculoskeletal: Negative for myalgias, back pain, joint swelling, arthralgias and gait problem.  Skin: Negative for color change, pallor, rash and wound.  Neurological: Negative for dizziness, tremors, weakness and light-headedness.  Hematological: Negative for adenopathy. Does not bruise/bleed easily.  Psychiatric/Behavioral: Negative for behavioral problems, confusion, sleep disturbance,  dysphoric mood, decreased concentration and agitation.    Physical Exam   BP (!) 144/79   Pulse 85   Temp 98 F (36.7 C)   Wt 160 lb (72.6 kg)   BMI 26.63 kg/m   Physical Exam  Constitutional:  oriented to person, place, and time. appears well-developed and well-nourished. No distress.  HENT: [place]/AT, PERRLA, no scleral icterus Mouth/Throat: Oropharynx is clear and moist. No oropharyngeal exudate.  Cardiovascular: Normal rate, regular rhythm and normal heart sounds. Exam reveals no gallop and no friction rub.  No murmur heard.  Pulmonary/Chest: Effort normal and breath sounds normal. No respiratory distress.  has no wheezes.  Neck = supple, no nuchal rigidity  Lymphadenopathy: no cervical adenopathy. No axillary adenopathy Neurological: alert and oriented to person, place, and time.  Skin: Skin is warm and dry. No rash noted. No erythema.  Psychiatric: a normal mood and affect.  behavior is normal.   Lab Results  Component Value Date   CD4TCELL 24 (L) 08/11/2017   Lab Results  Component Value Date   CD4TABS 470 08/11/2017   CD4TABS 460 05/13/2017   CD4TABS 430 02/05/2017   Lab Results  Component Value Date   HIV1RNAQUANT 82 (H) 08/11/2017   Lab Results  Component Value Date   HEPBSAB No 03/24/2006   Lab Results  Component Value Date   LABRPR NON-REACTIVE 02/05/2017    CBC Lab Results  Component Value Date   WBC 5.9 08/11/2017   RBC 4.53 08/11/2017   HGB 14.7 08/11/2017   HCT 41.8 08/11/2017   PLT 261 08/11/2017   MCV 92.3 08/11/2017   MCH 32.5 08/11/2017   MCHC 35.2 08/11/2017   RDW 12.5 08/11/2017   LYMPHSABS 2,077 08/11/2017   MONOABS 0.6 07/28/2017   EOSABS 83 08/11/2017    BMET Lab Results  Component Value Date   NA 138 08/11/2017   K 3.9 08/11/2017   CL 100 08/11/2017   CO2 28 08/11/2017   GLUCOSE 87 08/11/2017   BUN 9 08/11/2017   CREATININE 0.98 08/11/2017   CALCIUM 9.9 08/11/2017   GFRNONAA 61 08/11/2017   GFRAA 70 08/11/2017       Assessment and Plan  hiv disease = well controlled. Has small virologic blip. Will check vl in 3 mo. She continues to have excellent adherence  Long term medication management = cr stable. No need to change meds  Hypertension = well controlled  hld = continue with diet modification  Health maintenance = flu vaccine in the fall

## 2017-08-25 NOTE — Patient Instructions (Signed)
Come in hte first week of September for labs

## 2017-09-23 ENCOUNTER — Telehealth: Payer: Self-pay | Admitting: *Deleted

## 2017-09-23 NOTE — Telephone Encounter (Signed)
This RN returned VM per pt's call stating concern and request to be seen due to " pain in my breast to my armpit "  Per conversation- [name] states approximately 2 weeks ago she felt " like a stinging in my breast that had the surgery " Discomfort then spread from the bottom of her breast " up to under my arm pit where they took out the lymph nodes "  She states breast " looks lumpier " and is tender to touch.  This RN offered for pt to come in to the [facility] today - but pt did not have transportation.  She can come tomorrow and she would prefer to see [facility]/NP due to being known by her.  Per [facility]/NP appointment for overbook given for 8/28.  Informed pt who stated " thank you so much for going above for me ".

## 2017-09-24 ENCOUNTER — Inpatient Hospital Stay: Payer: Medicare Other | Attending: Oncology | Admitting: Adult Health

## 2017-09-24 ENCOUNTER — Inpatient Hospital Stay: Payer: Medicare Other

## 2017-09-24 ENCOUNTER — Encounter: Payer: Self-pay | Admitting: Adult Health

## 2017-09-24 ENCOUNTER — Other Ambulatory Visit: Payer: Self-pay | Admitting: Adult Health

## 2017-09-24 ENCOUNTER — Telehealth: Payer: Self-pay | Admitting: Adult Health

## 2017-09-24 ENCOUNTER — Telehealth: Payer: Self-pay | Admitting: *Deleted

## 2017-09-24 VITALS — BP 143/71 | HR 86 | Temp 98.0°F | Resp 16 | Ht 65.0 in | Wt 158.2 lb

## 2017-09-24 DIAGNOSIS — Z21 Asymptomatic human immunodeficiency virus [HIV] infection status: Secondary | ICD-10-CM | POA: Insufficient documentation

## 2017-09-24 DIAGNOSIS — Z171 Estrogen receptor negative status [ER-]: Principal | ICD-10-CM

## 2017-09-24 DIAGNOSIS — C50412 Malignant neoplasm of upper-outer quadrant of left female breast: Secondary | ICD-10-CM

## 2017-09-24 DIAGNOSIS — L539 Erythematous condition, unspecified: Secondary | ICD-10-CM | POA: Diagnosis not present

## 2017-09-24 DIAGNOSIS — Z853 Personal history of malignant neoplasm of breast: Secondary | ICD-10-CM | POA: Insufficient documentation

## 2017-09-24 DIAGNOSIS — N61 Mastitis without abscess: Secondary | ICD-10-CM

## 2017-09-24 LAB — CBC WITH DIFFERENTIAL (CANCER CENTER ONLY)
BASOS PCT: 1 %
Basophils Absolute: 0 10*3/uL (ref 0.0–0.1)
Eosinophils Absolute: 0.1 10*3/uL (ref 0.0–0.5)
Eosinophils Relative: 1 %
HEMATOCRIT: 46 % (ref 34.8–46.6)
Hemoglobin: 15.5 g/dL (ref 11.6–15.9)
Lymphocytes Relative: 24 %
Lymphs Abs: 1.6 10*3/uL (ref 0.9–3.3)
MCH: 32.4 pg (ref 25.1–34.0)
MCHC: 33.7 g/dL (ref 31.5–36.0)
MCV: 96.2 fL (ref 79.5–101.0)
MONOS PCT: 10 %
Monocytes Absolute: 0.6 10*3/uL (ref 0.1–0.9)
NEUTROS PCT: 64 %
Neutro Abs: 4.3 10*3/uL (ref 1.5–6.5)
Platelet Count: 248 10*3/uL (ref 145–400)
RBC: 4.78 MIL/uL (ref 3.70–5.45)
RDW: 13 % (ref 11.2–14.5)
WBC Count: 6.7 10*3/uL (ref 3.9–10.3)

## 2017-09-24 LAB — CMP (CANCER CENTER ONLY)
ALBUMIN: 4.4 g/dL (ref 3.5–5.0)
ALK PHOS: 95 U/L (ref 38–126)
ALT: 22 U/L (ref 0–44)
ANION GAP: 9 (ref 5–15)
AST: 26 U/L (ref 15–41)
BILIRUBIN TOTAL: 0.4 mg/dL (ref 0.3–1.2)
BUN: 12 mg/dL (ref 8–23)
CALCIUM: 10.3 mg/dL (ref 8.9–10.3)
CO2: 29 mmol/L (ref 22–32)
Chloride: 104 mmol/L (ref 98–111)
Creatinine: 0.91 mg/dL (ref 0.44–1.00)
GFR, Est AFR Am: >60 mL/min (ref >60)
GFR, Estimated: >60 mL/min (ref >60)
GLUCOSE: 92 mg/dL (ref 70–99)
Potassium: 4 mmol/L (ref 3.5–5.1)
Sodium: 142 mmol/L (ref 135–145)
TOTAL PROTEIN: 8 g/dL (ref 6.5–8.1)

## 2017-09-24 MED ORDER — CEPHALEXIN 500 MG PO CAPS: 500.0000 mg | ORAL_CAPSULE | Freq: Four times a day (QID) | ORAL | 0 refills | Status: AC

## 2017-09-24 NOTE — Telephone Encounter (Signed)
Gave avs and calendar ° °

## 2017-09-24 NOTE — Telephone Encounter (Signed)
Received call from [facility]. Parlee was seen there today for cellulitis, would like Dr Baxter Flattery to suggest acceptable antibiotic that would not interfere with Emi's current medication/antiretriviral regimen.  Per Dr Baxter Flattery, Keflex 500 mg 4 times daily for 7 days. Landis Gandy, RN

## 2017-09-24 NOTE — Progress Notes (Signed)
[facility]  [phone] [phone]     ID: [name] DOB: [birth year]  MR#: [id number]  IWO#:[id number]  Patient Care Team: Carlyle Basques, MD as PCP - General (Infectious Diseases) Carlyle Basques, MD as PCP - Infectious Diseases (Infectious Diseases) Alphonsa Overall, MD as Consulting Physician (General Surgery) Magrinat, Virgie Dad, MD as Consulting Physician (Oncology) Gery Pray, MD as Consulting Physician (Radiation Oncology) Marcial Pacas, MD as Consulting Physician (Neurology) Druscilla Brownie, MD as Consulting Physician (Dermatology) Gardenia Phlegm, NP as Nurse Practitioner (Hematology and Oncology) Christianne Borrow, DMD (Dentistry) OTHER MD:  CHIEF COMPLAINT: Triple negative breast cancer  CURRENT TREATMENT: Observation   BREAST CANCER HISTORY:  From the origil intake note:  [name] herself palpated a mass in the upper outer quadrant of her left breast the first week in November, and had bilateral diagnostic mammography with tomography and left breast ultrasonography at the [facility] 12/05/2015. The breast density was category C. In the upper outer left breast there was  A mass with macro lobulated contours which was barely palpable at the 12:30 o'clock position of the left breast 7 cm from the nipple. There was no palpable axillary adenopathy. Ultrasonography confirmed a 2.1 cm obliquely oriented hypoechoic mass in the area in question. Ultrasound of the left axilla was benign.  Biopsy of the left breast mass in question 12/07/2015 showed ([id number]) invasive ductal carcinoma, grade 3, estrogen and progesterone receptor negative, with an MIB-1 of 35%, and no HER-2 amplification, the signals ratio being 0.95 and the number per cell 2.80.  Her subsequent history is as detailed below  INTERVAL HISTORY: [name] returns today for follow-up of her estrogen receptor negative breast cancer and an urgent concern for breast  erythema ? Cellulitis.  She notes a new onset of left breast erythema, swelling, warmth, and pain that has been getting worse over the past 3 weeks.  She is concerned about what may be going on.    She continues to f/u with her PCP, along with Dr. Graylon Good.  She notes that the last time she saw Dr. Baxter Flattery, her viral load was detectable, and her CD4 count was lower.  She is concerned about taking any additional medications, and wants anything that may be prescribed today cleared with Dr. Baxter Flattery.       REVIEW OF SYSTEMS: [name] is doing moderately well.  She notes the issues above.  She denies any fevers or chills.  She is without fatigue.  She notes a strange intermittent abdominal fullness, worse with standing.  She will also sometimes have pain and pressure in her upper torso, and pain down in her lower back.  She says this has been going on for about 2-4 weeks.  Otherwise, [name] is doing well and denies any other issues such as fevers, chills, nausea, vomiting, constipation, diarrhea, headaches, vision changes, shortness of breath, cough, palpitations, chest pain, or any other concerns.      PAST MEDICAL HISTORY: Past Medical History:  Diagnosis Date  . HIV (human immunodeficiency virus infection) ([place])   . Hypertension   . Malignant neoplasm of upper-outer quadrant of left female breast ([place]) 12/11/2015  . Migraine   . Neuropathy   . Personal history of chemotherapy 2018  . Personal history of radiation therapy 2018  . PONV (postoperative nausea and vomiting)    said usually has to have scop patch  . Rash and nonspecific skin eruption 10/16/2015  . Spinal stenosis     PAST SURGICAL HISTORY:  Past Surgical History:  Procedure Laterality Date  . ABDOMINAL HYSTERECTOMY  1990  . BREAST BIOPSY Left 12/07/2015  . BREAST LUMPECTOMY Left 01/01/2016  . BREAST LUMPECTOMY WITH RADIOACTIVE SEED AND SENTINEL LYMPH NODE BIOPSY Left 01/01/2016   Procedure: LEFT BREAST LUMPECTOMY WITH RADIOACTIVE SEED  AND SENTINEL LYMPH NODE BIOPSY;  Surgeon: Alphonsa Overall, MD;  Location: [facility];  Service: General;  Laterality: Left;  . CARPAL TUNNEL RELEASE     both hands  . CERVICAL FUSION  1989  . PORTACATH PLACEMENT Right 01/01/2016   Procedure: INSERTION PORT-A-CATH WITH [name];  Surgeon: Alphonsa Overall, MD;  Location: [facility];  Service: General;  Laterality: Right;  . TONSILLECTOMY      FAMILY HISTORY Family History  Problem Relation Age of Onset  . High blood pressure Mother   . High Cholesterol Mother   . Cancer Father   . High blood pressure Father   . High Cholesterol Father   . Stomach cancer Father        intestinal  . Lung cancer Maternal Grandfather   . Breast cancer Maternal Aunt 46  The patient's father died from heart disease at the age of 78. The patient's mother died from "genetic cirrhosis" at the age of 91. The patient had 2 brothers, 5 sisters. A maternal aunt was diagnosed with breast cancer at age 4. There is no other history of breast or ovarian cancer in the family to the patient's knowledge   GYNECOLOGIC HISTORY:  No LMP recorded. Patient has had a hysterectomy. Menarche age 63, first live birth age 32. Patient is GX P1. She underwent hysterectomy with bilateral salpingo-oophorectomy at age 10. She took hormone replacement for approximately 10 years, until age 77. She also had oral contraceptives remotely for 8-10 years, with no complications.   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 She is currently not working. She now lives by herself. Her husband, [name] who retired from computer work, passed away in 2016-06-29. Their son [name] works as a Chief Strategy Officer for rest home in [place]. The patient has 3 grandchildren. She is not a Ambulance person.   ADVANCED DIRECTIVES: The patient has a living will in place   HEALTH MAINTENANCE: Social History   Tobacco Use  . Smoking status: Never Smoker    . Smokeless tobacco: Never Used  Substance Use Topics  . Alcohol use: Yes    Alcohol/week: 1.0 standard drinks    Types: 1 Glasses of wine per week    Comment: on weekends  . Drug use: No     Colonoscopy: 2012/be routinely  PAP: Status post hysterectomy  Bone density: Never   No Known Allergies  Current Outpatient Medications  Medication Sig Dispense Refill  . amLODipine (NORVASC) 10 MG tablet TAKE ONE TABLET BY MOUTH EVERY DAY 30 tablet 3  . aspirin EC 81 MG tablet Take 81 mg by mouth daily.    . benazepril (LOTENSIN) 40 MG tablet TAKE ONE TABLET BY MOUTH EVERY DAY 30 tablet 5  . BIKTARVY 50-200-25 MG TABS tablet Take 1 tablet by mouth daily. 30 tablet 5  . Biotin 5000 MCG CAPS Take 5,000 mcg daily by mouth.    . Coenzyme Q10 300 MG CAPS Take 300 capsules by mouth daily.    . cyclobenzaprine (FLEXERIL) 10 MG tablet Take 1 tablet (10 mg total) by mouth at bedtime as needed. 30 tablet 5  . fexofenadine (ALLEGRA) 180 MG tablet  Take 180 mg daily by mouth.    . gabapentin (NEURONTIN) 100 MG capsule Take 1 capsule (100 mg total) by mouth 2 (two) times daily. 180 capsule 1  . metoprolol tartrate (LOPRESSOR) 25 MG tablet TAKE 1/2 TABLET BY MOUTH TWICE DAILY 30 tablet 3  . Multiple Vitamin (MULTIVITAMIN WITH MINERALS) TABS tablet Take 1 tablet by mouth daily.    . Omega-3 Fatty Acids (FISH OIL) 1200 MG CAPS Take 1,200 mg by mouth 2 (two) times daily.      . polyethylene glycol (MIRALAX / GLYCOLAX) packet Take 17 g by mouth daily.    . rosuvastatin (CRESTOR) 5 MG tablet TAKE ONE TABLET BY MOUTH AT BEDTIME 30 tablet 5   No current facility-administered medications for this visit.    Facility-Administered Medications Ordered in Other Visits  Medication Dose Route Frequency Provider Last Rate Last Dose  . sodium chloride flush (NS) 0.9 % injection 10 mL  10 mL Intravenous PRN Magrinat, Virgie Dad, MD   10 mL at 05/21/16 1022    OBJECTIVE:  Vitals:   09/24/17 0843  BP: (!) 143/71   Pulse: 86  Resp: 16  Temp: 98 F (36.7 C)  SpO2: 100%     Body mass index is 26.33 kg/m.    ECOG FS:0 - Asymptomatic  GENERAL: Patient is a well appearing female in no acute distress HEENT:  Sclerae anicteric.  Oropharynx clear and moist. No ulcerations or evidence of oropharyngeal candidiasis. Neck is supple.  NODES:  No cervical, supraclavicular, or axillary lymphadenopathy palpated.  BREAST EXAM:  Right breast is without nodules, masses, skin or nipple changes, left breast s/p lumpectomy, radiation changes noted to the breast, breast is red, swollen, warm, and TTP LUNGS:  Clear to auscultation bilaterally.  No wheezes or rhonchi. HEART:  Regular rate and rhythm. No murmur appreciated. ABDOMEN:  Soft, nontender.  Positive, normoactive bowel sounds. No organomegaly palpated. MSK:  No focal spinal tenderness to palpation. Full range of motion bilaterally in the upper extremities. EXTREMITIES:  No peripheral edema.   SKIN:  Clear with no obvious rashes or skin changes. No nail dyscrasia. NEURO:  Nonfocal. Well oriented.  Appropriate affect.     LAB RESULTS:  CMP     Component Value Date/Time   NA 138 08/11/2017 0959   NA 140 12/31/2016 1059   K 3.9 08/11/2017 0959   K 3.9 12/31/2016 1059   CL 100 08/11/2017 0959   CO2 28 08/11/2017 0959   CO2 28 12/31/2016 1059   GLUCOSE 87 08/11/2017 0959   GLUCOSE 75 12/31/2016 1059   BUN 9 08/11/2017 0959   BUN 11.3 12/31/2016 1059   CREATININE 0.98 08/11/2017 0959   CREATININE 0.9 12/31/2016 1059   CALCIUM 9.9 08/11/2017 0959   CALCIUM 9.6 12/31/2016 1059   PROT 7.4 08/11/2017 0959   PROT 7.3 12/31/2016 1059   ALBUMIN 4.7 07/28/2017 0914   ALBUMIN 4.2 12/31/2016 1059   AST 33 08/11/2017 0959   AST 35 07/28/2017 0914   AST 31 12/31/2016 1059   ALT 24 08/11/2017 0959   ALT 34 07/28/2017 0914   ALT 28 12/31/2016 1059   ALKPHOS 99 07/28/2017 0914   ALKPHOS 80 12/31/2016 1059   BILITOT 0.6 08/11/2017 0959   BILITOT 0.5  07/28/2017 0914   BILITOT 0.40 12/31/2016 1059   GFRNONAA 61 08/11/2017 0959   GFRAA 70 08/11/2017 0959    INo results found for: SPEP, UPEP  Lab Results  Component Value Date   WBC 5.9  08/11/2017   NEUTROABS 3,115 08/11/2017   HGB 14.7 08/11/2017   HCT 41.8 08/11/2017   MCV 92.3 08/11/2017   PLT 261 08/11/2017      Chemistry      Component Value Date/Time   NA 138 08/11/2017 0959   NA 140 12/31/2016 1059   K 3.9 08/11/2017 0959   K 3.9 12/31/2016 1059   CL 100 08/11/2017 0959   CO2 28 08/11/2017 0959   CO2 28 12/31/2016 1059   BUN 9 08/11/2017 0959   BUN 11.3 12/31/2016 1059   CREATININE 0.98 08/11/2017 0959   CREATININE 0.9 12/31/2016 1059      Component Value Date/Time   CALCIUM 9.9 08/11/2017 0959   CALCIUM 9.6 12/31/2016 1059   ALKPHOS 99 07/28/2017 0914   ALKPHOS 80 12/31/2016 1059   AST 33 08/11/2017 0959   AST 35 07/28/2017 0914   AST 31 12/31/2016 1059   ALT 24 08/11/2017 0959   ALT 34 07/28/2017 0914   ALT 28 12/31/2016 1059   BILITOT 0.6 08/11/2017 0959   BILITOT 0.5 07/28/2017 0914   BILITOT 0.40 12/31/2016 1059       No results found for: LABCA2  No components found for: LABCA125  No results for input(s): INR in the last 168 hours.  Urinalysis    Component Value Date/Time   COLORURINE YELLOW 06/26/2017 [place] 06/26/2017 1205   LABSPEC 1.008 06/26/2017 1205   PHURINE 5.5 06/26/2017 1205   GLUCOSEU NEGATIVE 06/26/2017 1205   HGBUR NEGATIVE 06/26/2017 1205   BILIRUBINUR NEGATIVE 03/21/2007 1108   [place] 06/26/2017 [place] 06/26/2017 1205   UROBILINOGEN 1.0 03/21/2007 1108   NITRITE NEGATIVE 06/26/2017 1205   LEUKOCYTESUR TRACE (A) 06/26/2017 1205     STUDIES:    ELIGIBLE FOR AVAILABLE RESEARCH PROTOCOL: no  ASSESSMENT: 67 y.o. [place] woman status post left breast upper outer quadrant biopsy 12/07/2015 for a clinical T2 N0, stage IIA invasive ductal carcinoma, grade 3, triple  negative, with an MIB-1 of 35%  (1) genetics testing 01/23/2016  (a) alpha -1- anti trypsin and ceruloplamin labs drawn 01/19/2016, both normal  (2) Status post left lumpectomy/ sentinel lymph node sampling 01/01/2016 for a pT1c pN0, stage IA  Invasive ductal carcinoma, grade 3, with negative margins.  (3) Mammaprint sent from the patient's 12/07/2015 biopsy returned "high risk", predicting a risk of recurrence of nearly 30% untreated, decreasing to a 93% chance of five-year distant metastasis free survival with chemotherapy.  (4) adjuvant chemotherapy consisting of cyclophosphamide and doxorubicin in dose dense fashion 4 started 02/01/2016, completed 03/12/2016, followed by weekly Abraxane 12 starting 04/02/2016,  (a) changed on 04/30/16 to Abraxane days 1,8,15, with Neulasta support on day 16 of each 28 day cycle,   (b) then changed on 4/17 to Abraxane days 1, 8, on a 21 day cycle with Neulasta on day 9 due to neutropenia  (c) taxanes stopped after 9 doses due to neutropenia, last dose 06/18/2016  (5) adjuvant radiation 07/29/2016-09/12/2016 Site/dose:   1.) Breast, Left 1.8 Gy in 28 fractions                         2.) Breast, Left Boost 2 Gy in 5 fractions   (6) HIV positivity: Followed by Dr. Graylon Good. June 2018 labs show a T4 abs 450, percentage 31%, HIV RNA undetectable  PLAN: [name] is here today concerning her breast. Clinically I am concerned she has a brewing cellulitis.  Since her CD4 count is decreased and her HIV viral load detectable, she would really appreciate [name] discussing any antibiotics with her ID specialist, Dr. Baxter Flattery.  We called Dr. Baxter Flattery, who recommended Keflex, which was called in.    Jolynne also has the issue of pain, abdominal fullness, and there are concerns for a possible cancer recurrence.  Due to this I ordered a  CT chest, abdomen, and pelvis, along with a bone scan.  Patient and her son were in agreement.    We will f/u with Araiya once we get those scheduled.   She knows to call for any questions or concerns prior to her next appointment with [name].    A total of (30) minutes of face-to-face time was spent with this patient with greater than 50% of that time in counseling and care-coordination.  Wilber Bihari, NP 09/24/17 8:55 AM Medical Oncology and Hematology [facility] [address] [place], [place] [address] Tel. [phone]    Fax. [phone]

## 2017-09-26 ENCOUNTER — Other Ambulatory Visit (HOSPITAL_COMMUNITY): Payer: Self-pay | Admitting: Internal Medicine

## 2017-09-26 ENCOUNTER — Other Ambulatory Visit: Payer: Self-pay | Admitting: *Deleted

## 2017-09-29 ENCOUNTER — Other Ambulatory Visit: Payer: Self-pay | Admitting: Internal Medicine

## 2017-09-30 ENCOUNTER — Telehealth: Payer: Self-pay

## 2017-09-30 NOTE — Telephone Encounter (Signed)
Spoke with patient to verify appointment for CT. Per 9/3 phone msg return call

## 2017-10-01 ENCOUNTER — Other Ambulatory Visit: Payer: Medicare Other

## 2017-10-01 DIAGNOSIS — B20 Human immunodeficiency virus [HIV] disease: Secondary | ICD-10-CM

## 2017-10-02 ENCOUNTER — Ambulatory Visit (HOSPITAL_COMMUNITY)
Admission: RE | Admit: 2017-10-02 | Discharge: 2017-10-02 | Disposition: A | Discharge status: Home / Self Care | Payer: Medicare Other | Source: Ambulatory Visit | Attending: Adult Health | Admitting: Adult Health

## 2017-10-02 DIAGNOSIS — I700 Atherosclerosis of aorta: Secondary | ICD-10-CM | POA: Insufficient documentation

## 2017-10-02 DIAGNOSIS — C50412 Malignant neoplasm of upper-outer quadrant of left female breast: Secondary | ICD-10-CM | POA: Insufficient documentation

## 2017-10-02 DIAGNOSIS — K5900 Constipation, unspecified: Secondary | ICD-10-CM | POA: Diagnosis not present

## 2017-10-02 DIAGNOSIS — Z171 Estrogen receptor negative status [ER-]: Secondary | ICD-10-CM | POA: Diagnosis not present

## 2017-10-02 DIAGNOSIS — C50912 Malignant neoplasm of unspecified site of left female breast: Secondary | ICD-10-CM | POA: Diagnosis not present

## 2017-10-02 MED ORDER — IOHEXOL 300 MG/ML  SOLN
100.0000 mL | Freq: Once | INTRAMUSCULAR | Status: AC | PRN
  Administered 2017-10-02: 100 mL via INTRAVENOUS

## 2017-10-03 LAB — HIV-1 RNA QUANT-NO REFLEX-BLD
HIV 1 RNA Quant: 25 copies/mL — ABNORMAL HIGH
HIV-1 RNA Quant, Log: 1.4 Log copies/mL — ABNORMAL HIGH

## 2017-10-06 ENCOUNTER — Encounter (HOSPITAL_COMMUNITY)
Admission: RE | Admit: 2017-10-06 | Discharge: 2017-10-06 | Disposition: A | Discharge status: Home / Self Care | Payer: Medicare Other | Source: Ambulatory Visit | Attending: Adult Health | Admitting: Adult Health

## 2017-10-06 DIAGNOSIS — Z171 Estrogen receptor negative status [ER-]: Secondary | ICD-10-CM | POA: Insufficient documentation

## 2017-10-06 DIAGNOSIS — C50412 Malignant neoplasm of upper-outer quadrant of left female breast: Secondary | ICD-10-CM | POA: Diagnosis not present

## 2017-10-06 DIAGNOSIS — C50919 Malignant neoplasm of unspecified site of unspecified female breast: Secondary | ICD-10-CM | POA: Diagnosis not present

## 2017-10-06 MED ORDER — TECHNETIUM TC 99M MEDRONATE IV KIT
20.0000 | PACK | Freq: Once | INTRAVENOUS | Status: AC | PRN
  Administered 2017-10-06: 20 via INTRAVENOUS

## 2017-10-07 ENCOUNTER — Encounter: Payer: Self-pay | Admitting: Adult Health

## 2017-10-07 ENCOUNTER — Other Ambulatory Visit: Payer: Self-pay | Admitting: Adult Health

## 2017-10-07 DIAGNOSIS — I700 Atherosclerosis of aorta: Secondary | ICD-10-CM | POA: Insufficient documentation

## 2017-10-09 ENCOUNTER — Other Ambulatory Visit: Payer: Self-pay | Admitting: Pharmacist

## 2017-10-09 DIAGNOSIS — I10 Essential (primary) hypertension: Secondary | ICD-10-CM

## 2017-10-09 DIAGNOSIS — M62838 Other muscle spasm: Secondary | ICD-10-CM

## 2017-10-09 DIAGNOSIS — G620 Drug-induced polyneuropathy: Secondary | ICD-10-CM

## 2017-10-09 DIAGNOSIS — E7800 Pure hypercholesterolemia, unspecified: Secondary | ICD-10-CM

## 2017-10-09 DIAGNOSIS — B20 Human immunodeficiency virus [HIV] disease: Secondary | ICD-10-CM

## 2017-10-09 DIAGNOSIS — T451X5A Adverse effect of antineoplastic and immunosuppressive drugs, initial encounter: Secondary | ICD-10-CM

## 2017-10-09 MED ORDER — BENAZEPRIL HCL 40 MG PO TABS: 40.0000 mg | ORAL_TABLET | Freq: Every day | ORAL | 5 refills | Status: DC

## 2017-10-09 MED ORDER — BICTEGRAVIR-EMTRICITAB-TENOFOV 50-200-25 MG PO TABS: 1.0000 | ORAL_TABLET | Freq: Every day | ORAL | 5 refills | Status: DC

## 2017-10-09 MED ORDER — GABAPENTIN 100 MG PO CAPS: 100.0000 mg | ORAL_CAPSULE | Freq: Two times a day (BID) | ORAL | 1 refills | Status: DC

## 2017-10-09 MED ORDER — ROSUVASTATIN CALCIUM 5 MG PO TABS: 5.0000 mg | ORAL_TABLET | Freq: Every day | ORAL | 5 refills | Status: DC

## 2017-10-09 MED ORDER — CYCLOBENZAPRINE HCL 10 MG PO TABS: 10.0000 mg | ORAL_TABLET | Freq: Every evening | ORAL | 5 refills | Status: DC | PRN

## 2017-10-09 MED ORDER — AMLODIPINE BESYLATE 10 MG PO TABS: 10.0000 mg | ORAL_TABLET | Freq: Every day | ORAL | 3 refills | Status: DC

## 2017-10-09 MED ORDER — METOPROLOL TARTRATE 25 MG PO TABS: 12.5000 mg | ORAL_TABLET | Freq: Two times a day (BID) | ORAL | 3 refills | Status: DC

## 2017-10-09 NOTE — Addendum Note (Signed)
Addended by: Darletta Moll on: 10/09/2017 03:39 PM   Modules accepted: Orders

## 2017-10-09 NOTE — Progress Notes (Signed)
Transferring to [facility].

## 2017-10-13 ENCOUNTER — Telehealth: Payer: Self-pay

## 2017-10-13 ENCOUNTER — Telehealth: Payer: Self-pay | Admitting: *Deleted

## 2017-10-13 NOTE — Telephone Encounter (Signed)
Spoke with patient today to inform of normal bone and T scans.  Pt voiced that pain in ribs is gone since she took the antibiotics that were prescribed last visit.  She stated that is is still having numbness/tingling in two fingers on her left hand as well as numbness in left leg hip to toes.  She says she feels like this may be a circulation problem.  Patient is currently on gabapentin 100 mg BID.  Nurse asked patient if med was helping at all and she said she thought so.  Patient still concerned about problems above.  Nurse told patient will let NP know and figure a plan from there.  Patient voiced understanding w/o further questions.

## 2017-10-13 NOTE — Telephone Encounter (Signed)
Call received from [facility] in reference to "results from [facility]".  Call transferred to collaborative to assist with this request.  No further questions or needs at this time.

## 2017-10-14 ENCOUNTER — Telehealth: Payer: Self-pay

## 2017-10-14 NOTE — Telephone Encounter (Signed)
For the numbness, she can increase the gabapentin to 200mg  po bid.

## 2017-10-14 NOTE — Telephone Encounter (Signed)
Spoke with patient today per NP to increase gabapentin to 200 mg BID.  Patient voiced understanding and states she will let her HIV doctor know next week at that appt so that a new script can be written at that office.     Patient Calls   [place], Charlestine Massed, NP  You 5 hours ago (8:03 AM)      For the numbness, she can increase the gabapentin to 200mg  po bid.        Documentation     You routed conversation to [place], Charlestine Massed, NP Yesterday (12:43 PM)    Fredda Hammed (12:35 PM)      Spoke with patient today to inform of normal bone and T scans.  Pt voiced that pain in ribs is gone since she took the antibiotics that were prescribed last visit.  She stated that is is still having numbness/tingling in two fingers on her left hand as well as numbness in left leg hip to toes.  She says she feels like this may be a circulation problem.  Patient is currently on gabapentin 100 mg BID.  Nurse asked patient if med was helping at all and she said she thought so.  Patient still concerned about problems above.  Nurse told patient will let NP know and figure a plan from there.  Patient voiced understanding w/o further questions.      Documentation     You  Symonds, CHARLIE CHAR (12:35 PM)

## 2017-10-20 ENCOUNTER — Encounter: Payer: Self-pay | Admitting: Internal Medicine

## 2017-10-20 ENCOUNTER — Ambulatory Visit (INDEPENDENT_AMBULATORY_CARE_PROVIDER_SITE_OTHER): Payer: Medicare Other | Admitting: Internal Medicine

## 2017-10-20 VITALS — BP 145/85 | HR 88 | Temp 98.3°F | Wt 157.0 lb

## 2017-10-20 DIAGNOSIS — B20 Human immunodeficiency virus [HIV] disease: Secondary | ICD-10-CM

## 2017-10-20 DIAGNOSIS — I10 Essential (primary) hypertension: Secondary | ICD-10-CM | POA: Diagnosis not present

## 2017-10-20 DIAGNOSIS — I739 Peripheral vascular disease, unspecified: Secondary | ICD-10-CM

## 2017-10-22 MED FILL — GABAPENTIN 100 MG CAPSULE: 100 | 90 days supply | Qty: 180 | Fill #0

## 2017-10-22 MED FILL — ROSUVASTATIN CALCIUM 5 MG T: 5 | 30 days supply | Qty: 30 | Fill #0

## 2017-10-22 MED FILL — AMLODIPINE BESYLATE 10 MG T: 10 | 30 days supply | Qty: 30 | Fill #0

## 2017-10-22 MED FILL — BIKTARVY 50-200-25 MG TABS: 50-200-25 | 30 days supply | Qty: 30 | Fill #0

## 2017-10-22 MED FILL — METOPROLOL TARTRATE 25 MG T: 25 | 30 days supply | Qty: 30 | Fill #0

## 2017-10-22 MED FILL — CYCLOBENZAPRINE HCL 10 MG T: 10 | 30 days supply | Qty: 30 | Fill #0

## 2017-10-22 MED FILL — BENAZEPRIL HCL 40 MG TABLET: 40 | 30 days supply | Qty: 30 | Fill #0

## 2017-10-23 NOTE — Progress Notes (Signed)
RFV: follow up for hiv disease  Patient ID: [name], female   DOB: January 29, 1951, 67 y.o.   MRN: [id number]  HPI [name] is a 67 yo F with hiv disease, breast cancer finished chemo/radiation. She reports that she recently had a bout of left sided chest/abdomen pain that was concerning that she went to the ED for evaluation. They were unable to find cause of her pain other than msk strain. She is otherwise doing well. She did mention that she felt that she had memory loss, transient with higher doses of benadryl and did not want to change her curretn regimen.  Outpatient Encounter Medications as of 10/20/2017  Medication Sig  . amLODipine (NORVASC) 10 MG tablet Take 1 tablet (10 mg total) by mouth daily.  Marland Kitchen aspirin EC 81 MG tablet Take 81 mg by mouth daily.  . benazepril (LOTENSIN) 40 MG tablet Take 1 tablet (40 mg total) by mouth daily.  . bictegravir-emtricitabine-tenofovir AF (BIKTARVY) 50-200-25 MG TABS tablet Take 1 tablet by mouth daily.  . Biotin 5000 MCG CAPS Take 5,000 mcg daily by mouth.  . Coenzyme Q10 300 MG CAPS Take 300 capsules by mouth daily.  . cyclobenzaprine (FLEXERIL) 10 MG tablet Take 1 tablet (10 mg total) by mouth at bedtime as needed.  . fexofenadine (ALLEGRA) 180 MG tablet Take 180 mg daily by mouth.  . gabapentin (NEURONTIN) 100 MG capsule Take 1 capsule (100 mg total) by mouth 2 (two) times daily.  . metoprolol tartrate (LOPRESSOR) 25 MG tablet Take 0.5 tablets (12.5 mg total) by mouth 2 (two) times daily.  . Multiple Vitamin (MULTIVITAMIN WITH MINERALS) TABS tablet Take 1 tablet by mouth daily.  . Omega-3 Fatty Acids (FISH OIL) 1200 MG CAPS Take 1,200 mg by mouth 2 (two) times daily.    . polyethylene glycol (MIRALAX / GLYCOLAX) packet Take 17 g by mouth daily.  . rosuvastatin (CRESTOR) 5 MG tablet Take 1 tablet (5 mg total) by mouth at bedtime.   Facility-Administered Encounter Medications as of 10/20/2017  Medication  . sodium chloride flush (NS) 0.9 %  injection 10 mL     Patient Active Problem List   Diagnosis Date Noted  . Aortic atherosclerosis ([place]) 10/07/2017  . Oral thrush 06/26/2017  . Dry mouth & dry eyes  06/26/2017  . Muscle spasms of neck 11/25/2016  . Drug-induced neutropenia ([place]) 04/23/2016  . Malignant neoplasm of upper-outer quadrant of left breast in female, estrogen receptor negative ([place]) 12/11/2015  . Hereditary and idiopathic peripheral neuropathy 07/16/2012  . Lumbar neuritis 07/16/2012  . Cervicalgia 07/16/2012  . URI 12/02/2006  . Human immunodeficiency virus (HIV) disease ([place]) 11/08/2005  . HYPERLIPIDEMIA 11/08/2005  . Essential hypertension 11/08/2005  . BRONCHITIS 11/08/2005  . Regional enteritis ([place]) 11/08/2005  . SYMPTOM, PAINFUL RESPIRATION 11/08/2005     Health Maintenance Due  Topic Date Due  . COLONOSCOPY  11/20/2001  . PAP SMEAR  12/17/2010  . DEXA SCAN  11/20/2016  . INFLUENZA VACCINE  08/28/2017    Social History   Tobacco Use  . Smoking status: Never Smoker  . Smokeless tobacco: Never Used  Substance Use Topics  . Alcohol use: Yes    Alcohol/week: 1.0 standard drinks    Types: 1 Glasses of wine per week    Comment: on weekends  . Drug use: No   Review of Systems 12 point ros is negative other than arthritis and mild peripheral neuropathy and has leg pain when ambulating Physical Exam   BP Marland Kitchen)  145/85   Pulse 88   Temp 98.3 F (36.8 C) (Oral)   Wt 157 lb (71.2 kg)   BMI 26.13 kg/m   Physical Exam  Constitutional:  oriented to person, place, and time. appears well-developed and well-nourished. No distress.  HENT: [place]/AT, PERRLA, no scleral icterus Mouth/Throat: Oropharynx is clear and moist. No oropharyngeal exudate.  Cardiovascular: Normal rate, regular rhythm and normal heart sounds. Exam reveals no gallop and no friction rub.  No murmur heard.  Pulmonary/Chest: Effort normal and breath sounds normal. No respiratory distress.  has no wheezes.  Neck = supple, no nuchal  rigidity Abdominal: Soft. Bowel sounds are normal.  exhibits no distension. There is no tenderness.  Lymphadenopathy: no cervical adenopathy. No axillary adenopathy Neurological: alert and oriented to person, place, and time.  Skin: Skin is warm and dry. No rash noted. No erythema.  Psychiatric: a normal mood and affect.  behavior is normal.   Lab Results  Component Value Date   CD4TCELL 24 (L) 08/11/2017   Lab Results  Component Value Date   CD4TABS 470 08/11/2017   CD4TABS 460 05/13/2017   CD4TABS 430 02/05/2017   Lab Results  Component Value Date   HIV1RNAQUANT 25 (H) 10/01/2017   Lab Results  Component Value Date   HEPBSAB No 03/24/2006   Lab Results  Component Value Date   LABRPR NON-REACTIVE 02/05/2017    CBC Lab Results  Component Value Date   WBC 6.7 09/24/2017   RBC 4.78 09/24/2017   HGB 15.5 09/24/2017   HCT 46.0 09/24/2017   PLT 248 09/24/2017   MCV 96.2 09/24/2017   MCH 32.4 09/24/2017   MCHC 33.7 09/24/2017   RDW 13.0 09/24/2017   LYMPHSABS 1.6 09/24/2017   MONOABS 0.6 09/24/2017   EOSABS 0.1 09/24/2017    BMET Lab Results  Component Value Date   NA 142 09/24/2017   K 4.0 09/24/2017   CL 104 09/24/2017   CO2 29 09/24/2017   GLUCOSE 92 09/24/2017   BUN 12 09/24/2017   CREATININE 0.91 09/24/2017   CALCIUM 10.3 09/24/2017   GFRNONAA >60 09/24/2017   GFRAA >60 09/24/2017      Assessment and Plan  hiv disase= well controlled. Continue on current regimen  Peripheral neuropathy = continue on current dose of neurontin. Not interested in change dose due to CNS effects  htn = well controlled  Claudication = will check abi

## 2017-11-04 ENCOUNTER — Ambulatory Visit (INDEPENDENT_AMBULATORY_CARE_PROVIDER_SITE_OTHER): Payer: Medicare Other | Admitting: Behavioral Health

## 2017-11-04 DIAGNOSIS — Z23 Encounter for immunization: Secondary | ICD-10-CM | POA: Diagnosis not present

## 2017-11-05 ENCOUNTER — Ambulatory Visit (HOSPITAL_COMMUNITY)
Admission: RE | Admit: 2017-11-05 | Discharge: 2017-11-05 | Disposition: A | Discharge status: Home / Self Care | Payer: Medicare Other | Source: Ambulatory Visit | Attending: Internal Medicine | Admitting: Internal Medicine

## 2017-11-05 DIAGNOSIS — I739 Peripheral vascular disease, unspecified: Secondary | ICD-10-CM | POA: Diagnosis not present

## 2017-11-17 MED FILL — BENAZEPRIL HCL 40 MG TABLET: 40 | 30 days supply | Qty: 30 | Fill #1

## 2017-11-17 MED FILL — AMLODIPINE BESYLATE 10 MG T: 10 | 30 days supply | Qty: 30 | Fill #1

## 2017-11-17 MED FILL — METOPROLOL TARTRATE 25 MG T: 25 | 30 days supply | Qty: 30 | Fill #1

## 2017-11-17 MED FILL — ROSUVASTATIN CALCIUM 5 MG T: 5 | 30 days supply | Qty: 30 | Fill #1

## 2017-11-17 MED FILL — BIKTARVY 50-200-25 MG TABS: 50-200-25 | 30 days supply | Qty: 30 | Fill #1

## 2017-11-20 ENCOUNTER — Other Ambulatory Visit: Payer: Self-pay | Admitting: Oncology

## 2017-12-15 MED FILL — AMLODIPINE BESYLATE 10 MG T: 10 | 30 days supply | Qty: 30 | Fill #2

## 2017-12-15 MED FILL — BIKTARVY 50-200-25 MG TABS: 50-200-25 | 30 days supply | Qty: 30 | Fill #2

## 2017-12-15 MED FILL — METOPROLOL TARTRATE 25 MG T: 25 | 30 days supply | Qty: 30 | Fill #2

## 2017-12-15 MED FILL — ROSUVASTATIN CALCIUM 5 MG T: 5 | 30 days supply | Qty: 30 | Fill #2

## 2017-12-15 MED FILL — BENAZEPRIL HCL 40 MG TABLET: 40 | 30 days supply | Qty: 30 | Fill #2

## 2017-12-19 ENCOUNTER — Telehealth: Payer: Self-pay | Admitting: *Deleted

## 2017-12-19 NOTE — Telephone Encounter (Signed)
Prior Authorization initiated via cover my med per [facility] patient pharmacy request.   Landis Gandy, RN

## 2017-12-23 MED FILL — CYCLOBENZAPRINE HCL 10 MG T: 10 | 30 days supply | Qty: 30 | Fill #1

## 2017-12-29 ENCOUNTER — Ambulatory Visit
Admission: RE | Admit: 2017-12-29 | Discharge: 2017-12-29 | Disposition: A | Discharge status: Home / Self Care | Payer: Medicare Other | Source: Ambulatory Visit | Attending: Adult Health | Admitting: Adult Health

## 2017-12-29 DIAGNOSIS — C50412 Malignant neoplasm of upper-outer quadrant of left female breast: Secondary | ICD-10-CM

## 2017-12-29 DIAGNOSIS — Z171 Estrogen receptor negative status [ER-]: Principal | ICD-10-CM

## 2017-12-29 DIAGNOSIS — Z853 Personal history of malignant neoplasm of breast: Secondary | ICD-10-CM | POA: Diagnosis not present

## 2017-12-29 DIAGNOSIS — R928 Other abnormal and inconclusive findings on diagnostic imaging of breast: Secondary | ICD-10-CM | POA: Diagnosis not present

## 2018-01-05 ENCOUNTER — Other Ambulatory Visit: Payer: Self-pay | Admitting: *Deleted

## 2018-01-05 DIAGNOSIS — B20 Human immunodeficiency virus [HIV] disease: Secondary | ICD-10-CM

## 2018-01-06 ENCOUNTER — Other Ambulatory Visit: Payer: Medicare Other

## 2018-01-06 DIAGNOSIS — B20 Human immunodeficiency virus [HIV] disease: Secondary | ICD-10-CM | POA: Diagnosis not present

## 2018-01-08 LAB — CBC WITH DIFFERENTIAL/PLATELET
BASOS PCT: 1.2 %
Basophils Absolute: 70 cells/uL (ref 0–200)
EOS ABS: 157 {cells}/uL (ref 15–500)
Eosinophils Relative: 2.7 %
HEMATOCRIT: 44.2 % (ref 35.0–45.0)
Hemoglobin: 15.5 g/dL (ref 11.7–15.5)
Lymphs Abs: 2013 cells/uL (ref 850–3900)
MCH: 32.8 pg (ref 27.0–33.0)
MCHC: 35.1 g/dL (ref 32.0–36.0)
MCV: 93.4 fL (ref 80.0–100.0)
MONOS PCT: 10 %
MPV: 9.7 fL (ref 7.5–12.5)
NEUTROS ABS: 2981 {cells}/uL (ref 1500–7800)
Neutrophils Relative %: 51.4 %
Platelets: 297 10*3/uL (ref 140–400)
RBC: 4.73 10*6/uL (ref 3.80–5.10)
RDW: 12.3 % (ref 11.0–15.0)
Total Lymphocyte: 34.7 %
WBC: 5.8 10*3/uL (ref 3.8–10.8)
WBCMIX: 580 {cells}/uL (ref 200–950)

## 2018-01-08 LAB — COMPLETE METABOLIC PANEL WITH GFR
AG RATIO: 1.7 (calc) (ref 1.0–2.5)
ALKALINE PHOSPHATASE (APISO): 82 U/L (ref 33–130)
ALT: 28 U/L (ref 6–29)
AST: 32 U/L (ref 10–35)
Albumin: 4.8 g/dL (ref 3.6–5.1)
BUN: 12 mg/dL (ref 7–25)
CO2: 31 mmol/L (ref 20–32)
Calcium: 10.1 mg/dL (ref 8.6–10.4)
Chloride: 103 mmol/L (ref 98–110)
Creat: 0.85 mg/dL (ref 0.50–0.99)
GFR, Est African American: 83 mL/min/{1.73_m2} (ref >=60)
GFR, Est Non African American: 71 mL/min/{1.73_m2} (ref >=60)
GLOBULIN: 2.8 g/dL (ref 1.9–3.7)
Glucose, Bld: 86 mg/dL (ref 65–99)
Potassium: 4.1 mmol/L (ref 3.5–5.3)
SODIUM: 142 mmol/L (ref 135–146)
Total Bilirubin: 0.5 mg/dL (ref 0.2–1.2)
Total Protein: 7.6 g/dL (ref 6.1–8.1)

## 2018-01-08 LAB — HIV-1 RNA QUANT-NO REFLEX-BLD
HIV 1 RNA Quant: <20 copies/mL
HIV-1 RNA QUANT, LOG: NOT DETECTED {Log_copies}/mL

## 2018-01-08 LAB — T-HELPER CELL (CD4) - (RCID CLINIC ONLY)
CD4 % Helper T Cell: 24 % — ABNORMAL LOW (ref 33–55)
CD4 T CELL ABS: 490 /uL (ref 400–2700)

## 2018-01-19 ENCOUNTER — Ambulatory Visit (INDEPENDENT_AMBULATORY_CARE_PROVIDER_SITE_OTHER): Payer: Medicare Other | Admitting: Internal Medicine

## 2018-01-19 VITALS — BP 119/74 | HR 84 | Temp 97.9°F | Wt 160.0 lb

## 2018-01-19 DIAGNOSIS — Z853 Personal history of malignant neoplasm of breast: Secondary | ICD-10-CM | POA: Diagnosis not present

## 2018-01-19 DIAGNOSIS — B20 Human immunodeficiency virus [HIV] disease: Secondary | ICD-10-CM

## 2018-01-19 DIAGNOSIS — Z79899 Other long term (current) drug therapy: Secondary | ICD-10-CM | POA: Diagnosis not present

## 2018-01-19 MED FILL — AMLODIPINE BESYLATE 10 MG T: 10 | 30 days supply | Qty: 30 | Fill #3

## 2018-01-19 MED FILL — CYCLOBENZAPRINE HCL 10 MG T: 10 | 30 days supply | Qty: 30 | Fill #2

## 2018-01-19 MED FILL — BENAZEPRIL HCL 40 MG TABLET: 40 | 30 days supply | Qty: 30 | Fill #3

## 2018-01-19 MED FILL — ROSUVASTATIN CALCIUM 5 MG T: 5 | 30 days supply | Qty: 30 | Fill #3

## 2018-01-19 MED FILL — METOPROLOL TARTRATE 25 MG T: 25 | 30 days supply | Qty: 30 | Fill #3

## 2018-01-19 MED FILL — BIKTARVY 50-200-25 MG TABS: 50-200-25 | 30 days supply | Qty: 30 | Fill #3

## 2018-01-19 MED FILL — GABAPENTIN 100 MG CAPSULE: 100 | 90 days supply | Qty: 180 | Fill #1

## 2018-01-19 NOTE — Progress Notes (Signed)
RFV: follow up for hiv disease  Patient ID: [name], female   DOB: [birth year], 67 y.o.   MRN: [id number]  HPI 67yo F with history of well controlled hiv disease, hx of breast ca now in remission. She reports Doing great. feels back to her baseline health. No recent illnesses. Has excellent adherence with medicaitons.  Outpatient Encounter Medications as of 01/19/2018  Medication Sig  . acetaminophen (TYLENOL) 500 MG tablet Take 500 mg by mouth every 6 (six) hours as needed.  Marland Kitchen amLODipine (NORVASC) 10 MG tablet Take 1 tablet (10 mg total) by mouth daily.  Marland Kitchen aspirin EC 81 MG tablet Take 81 mg by mouth daily.  . benazepril (LOTENSIN) 40 MG tablet Take 1 tablet (40 mg total) by mouth daily.  . bictegravir-emtricitabine-tenofovir AF (BIKTARVY) 50-200-25 MG TABS tablet Take 1 tablet by mouth daily.  . Biotin 5000 MCG CAPS Take 5,000 mcg daily by mouth.  . Coenzyme Q10 300 MG CAPS Take 300 capsules by mouth daily.  . cyclobenzaprine (FLEXERIL) 10 MG tablet Take 1 tablet (10 mg total) by mouth at bedtime as needed.  . fexofenadine (ALLEGRA) 180 MG tablet Take 180 mg daily by mouth.  . gabapentin (NEURONTIN) 100 MG capsule Take 1 capsule (100 mg total) by mouth 2 (two) times daily.  . metoprolol tartrate (LOPRESSOR) 25 MG tablet Take 0.5 tablets (12.5 mg total) by mouth 2 (two) times daily.  . Multiple Vitamin (MULTIVITAMIN WITH MINERALS) TABS tablet Take 1 tablet by mouth daily.  . Omega-3 Fatty Acids (FISH OIL) 1200 MG CAPS Take 1,200 mg by mouth 2 (two) times daily.    . polyethylene glycol (MIRALAX / GLYCOLAX) packet Take 17 g by mouth daily.  . rosuvastatin (CRESTOR) 5 MG tablet Take 1 tablet (5 mg total) by mouth at bedtime.   Facility-Administered Encounter Medications as of 01/19/2018  Medication  . sodium chloride flush (NS) 0.9 % injection 10 mL     Patient Active Problem List   Diagnosis Date Noted  . Aortic atherosclerosis ([place]) 10/07/2017  . Oral thrush  06/26/2017  . Dry mouth & dry eyes  06/26/2017  . Muscle spasms of neck 11/25/2016  . Drug-induced neutropenia ([place]) 04/23/2016  . Malignant neoplasm of upper-outer quadrant of left breast in female, estrogen receptor negative ([place]) 12/11/2015  . Hereditary and idiopathic peripheral neuropathy 07/16/2012  . Lumbar neuritis 07/16/2012  . Cervicalgia 07/16/2012  . URI 12/02/2006  . Human immunodeficiency virus (HIV) disease ([place]) 11/08/2005  . HYPERLIPIDEMIA 11/08/2005  . Essential hypertension 11/08/2005  . BRONCHITIS 11/08/2005  . Regional enteritis ([place]) 11/08/2005  . SYMPTOM, PAINFUL RESPIRATION 11/08/2005     Health Maintenance Due  Topic Date Due  . COLONOSCOPY  11/20/2001  . DEXA SCAN  11/20/2016    Social History   Tobacco Use  . Smoking status: Never Smoker  . Smokeless tobacco: Never Used  Substance Use Topics  . Alcohol use: Yes    Alcohol/week: 1.0 standard drinks    Types: 1 Glasses of wine per week    Comment: on weekends  . Drug use: No   Review of Systems Constitutional: Negative for fever, chills, diaphoresis, activity change, appetite change, fatigue and unexpected weight change.  HENT: Negative for congestion, sore throat, rhinorrhea, sneezing, trouble swallowing and sinus pressure.  Eyes: Negative for photophobia and visual disturbance.  Respiratory: Negative for cough, chest tightness, shortness of breath, wheezing and stridor.  Cardiovascular: Negative for chest pain, palpitations and leg swelling.  Gastrointestinal: Negative for  nausea, vomiting, abdominal pain, diarrhea, constipation, blood in stool, abdominal distention and anal bleeding.  Genitourinary: Negative for dysuria, hematuria, flank pain and difficulty urinating.  Musculoskeletal: Negative for myalgias, back pain, joint swelling, arthralgias and gait problem.  Skin: Negative for color change, pallor, rash and wound.  Neurological: Negative for dizziness, tremors, weakness and  light-headedness.  Hematological: Negative for adenopathy. Does not bruise/bleed easily.  Psychiatric/Behavioral: Negative for behavioral problems, confusion, sleep disturbance, dysphoric mood, decreased concentration and agitation.    Physical Exam   BP 119/74   Pulse 84   Temp 97.9 F (36.6 C) (Oral)   Wt 160 lb (72.6 kg)   BMI 26.63 kg/m   Physical Exam  Constitutional:  oriented to person, place, and time. appears well-developed and well-nourished. No distress.  HENT: /AT, PERRLA, no scleral icterus Mouth/Throat: Oropharynx is clear and moist. No oropharyngeal exudate.  Cardiovascular: Normal rate, regular rhythm and normal heart sounds. Exam reveals no gallop and no friction rub.  No murmur heard.  Pulmonary/Chest: Effort normal and breath sounds normal. No respiratory distress.  has no wheezes.  Neck = supple, no nuchal rigidity Lymphadenopathy: no cervical adenopathy. No axillary adenopathy Neurological: alert and oriented to person, place, and time.  Skin: Skin is warm and dry. No rash noted. No erythema.  Psychiatric: a normal mood and affect.  behavior is normal.   Lab Results  Component Value Date   CD4TCELL 24 (L) 01/06/2018   Lab Results  Component Value Date   CD4TABS 490 01/06/2018   CD4TABS 470 08/11/2017   CD4TABS 460 05/13/2017   Lab Results  Component Value Date   HIV1RNAQUANT <20 NOT DETECTED 01/06/2018   Lab Results  Component Value Date   HEPBSAB No 03/24/2006   Lab Results  Component Value Date   LABRPR NON-REACTIVE 02/05/2017    CBC Lab Results  Component Value Date   WBC 5.8 01/06/2018   RBC 4.73 01/06/2018   HGB 15.5 01/06/2018   HCT 44.2 01/06/2018   PLT 297 01/06/2018   MCV 93.4 01/06/2018   MCH 32.8 01/06/2018   MCHC 35.1 01/06/2018   RDW 12.3 01/06/2018   LYMPHSABS 2,013 01/06/2018   MONOABS 0.6 09/24/2017   EOSABS 157 01/06/2018    BMET Lab Results  Component Value Date   NA 142 01/06/2018   K 4.1 01/06/2018   CL  103 01/06/2018   CO2 31 01/06/2018   GLUCOSE 86 01/06/2018   BUN 12 01/06/2018   CREATININE 0.85 01/06/2018   CALCIUM 10.1 01/06/2018   GFRNONAA 71 01/06/2018   GFRAA 83 01/06/2018      Assessment and Plan  hiv disease = well controlled. Continue on biktarvy  Long term medication management = cr function is stable  Hx of breast ca = followed by dr. Jana Hakim. In remission.  rtc in 4-6 mo

## 2018-01-27 ENCOUNTER — Other Ambulatory Visit: Payer: Self-pay

## 2018-01-27 DIAGNOSIS — C50412 Malignant neoplasm of upper-outer quadrant of left female breast: Secondary | ICD-10-CM

## 2018-01-27 DIAGNOSIS — Z171 Estrogen receptor negative status [ER-]: Principal | ICD-10-CM

## 2018-01-29 ENCOUNTER — Telehealth: Payer: Self-pay | Admitting: Oncology

## 2018-01-29 ENCOUNTER — Inpatient Hospital Stay: Payer: Medicare Other | Attending: Oncology

## 2018-01-29 ENCOUNTER — Inpatient Hospital Stay (HOSPITAL_BASED_OUTPATIENT_CLINIC_OR_DEPARTMENT_OTHER): Payer: Medicare Other | Admitting: Oncology

## 2018-01-29 VITALS — BP 145/80 | HR 80 | Temp 98.4°F | Resp 18 | Ht 65.0 in | Wt 159.4 lb

## 2018-01-29 DIAGNOSIS — Z853 Personal history of malignant neoplasm of breast: Secondary | ICD-10-CM | POA: Diagnosis not present

## 2018-01-29 DIAGNOSIS — C50412 Malignant neoplasm of upper-outer quadrant of left female breast: Secondary | ICD-10-CM

## 2018-01-29 DIAGNOSIS — Z171 Estrogen receptor negative status [ER-]: Principal | ICD-10-CM

## 2018-01-29 LAB — CMP (CANCER CENTER ONLY)
ALBUMIN: 4.5 g/dL (ref 3.5–5.0)
ALK PHOS: 94 U/L (ref 38–126)
ALT: 32 U/L (ref 0–44)
ANION GAP: 10 (ref 5–15)
AST: 33 U/L (ref 15–41)
BUN: 13 mg/dL (ref 8–23)
CALCIUM: 9.8 mg/dL (ref 8.9–10.3)
CO2: 27 mmol/L (ref 22–32)
Chloride: 104 mmol/L (ref 98–111)
Creatinine: 0.94 mg/dL (ref 0.44–1.00)
GFR, Estimated: >60 mL/min (ref >60)
GLUCOSE: 95 mg/dL (ref 70–99)
Potassium: 3.5 mmol/L (ref 3.5–5.1)
Sodium: 141 mmol/L (ref 135–145)
TOTAL PROTEIN: 7.9 g/dL (ref 6.5–8.1)
Total Bilirubin: 0.5 mg/dL (ref 0.3–1.2)

## 2018-01-29 LAB — CBC WITH DIFFERENTIAL (CANCER CENTER ONLY)
Abs Immature Granulocytes: 0 10*3/uL (ref 0.00–0.07)
BASOS PCT: 1 %
Basophils Absolute: 0.1 10*3/uL (ref 0.0–0.1)
EOS ABS: 0.1 10*3/uL (ref 0.0–0.5)
Eosinophils Relative: 2 %
HCT: 44.8 % (ref 36.0–46.0)
Hemoglobin: 15.3 g/dL — ABNORMAL HIGH (ref 12.0–15.0)
IMMATURE GRANULOCYTES: 0 %
Lymphocytes Relative: 32 %
Lymphs Abs: 2 10*3/uL (ref 0.7–4.0)
MCH: 32.4 pg (ref 26.0–34.0)
MCHC: 34.2 g/dL (ref 30.0–36.0)
MCV: 94.9 fL (ref 80.0–100.0)
MONOS PCT: 10 %
Monocytes Absolute: 0.6 10*3/uL (ref 0.1–1.0)
NEUTROS PCT: 55 %
Neutro Abs: 3.5 10*3/uL (ref 1.7–7.7)
PLATELETS: 242 10*3/uL (ref 150–400)
RBC: 4.72 MIL/uL (ref 3.87–5.11)
RDW: 12.3 % (ref 11.5–15.5)
WBC Count: 6.3 10*3/uL (ref 4.0–10.5)
nRBC: 0 % (ref 0.0–0.2)

## 2018-01-29 NOTE — Progress Notes (Signed)
[name]  [phone] [phone]     ID: BUNA CUPPETT DOB: 02-04-1950  MR#: [id number]  ZHY#:[id number]  Patient Care Team: Carlyle Basques, MD as PCP - General (Infectious Diseases) Carlyle Basques, MD as PCP - Infectious Diseases (Infectious Diseases) Alphonsa Overall, MD as Consulting Physician (General Surgery) Magrinat, Virgie Dad, MD as Consulting Physician (Oncology) Gery Pray, MD as Consulting Physician (Radiation Oncology) Marcial Pacas, MD as Consulting Physician (Neurology) Druscilla Brownie, MD as Consulting Physician (Dermatology) Gardenia Phlegm, NP as Nurse Practitioner (Hematology and Oncology) Christianne Borrow, DMD (Dentistry) OTHER MD:  CHIEF COMPLAINT: Triple negative breast cancer  CURRENT TREATMENT: Observation   BREAST CANCER HISTORY:  From the original intake note:  [name] herself palpated a mass in the upper outer quadrant of her left breast the first week in November, and had bilateral diagnostic mammography with tomography and left breast ultrasonography at the [facility] 12/05/2015. The breast density was category C. In the upper outer left breast there was  A mass with macro lobulated contours which was barely palpable at the 12:30 o'clock position of the left breast 7 cm from the nipple. There was no palpable axillary adenopathy. Ultrasonography confirmed a 2.1 cm obliquely oriented hypoechoic mass in the area in question. Ultrasound of the left axilla was benign.  Biopsy of the left breast mass in question 12/07/2015 showed ([id number]) invasive ductal carcinoma, grade 3, estrogen and progesterone receptor negative, with an MIB-1 of 35%, and no HER-2 amplification, the signals ratio being 0.95 and the number per cell 2.80.  Her subsequent history is as detailed below  INTERVAL HISTORY: Ever returns today for follow-up of her estrogen receptor negative breast cancer. She is doing well overall.   Since  her last visit, she underwent chest/abdomen/pelvis CT scan on 10/02/2017. This showed sclerosis of the left first rib but no other suspicious findings.  She also underwent bone scan on 10/06/2017, which showed no strong evidence to suggest bony metastatic disease.  She also received her yearly mammogram on 12/29/2017, which showed breast density category B and no evidence of malignancy in either breast.  She continues to f/u with her PCP, along with Dr. Graylon Good. She last saw Dr. Baxter Flattery on 01/19/2018 and was doing very well.   REVIEW OF SYSTEMS: Melayah reports walking 30-40 minutes every other day. She states she had a nice holiday with a lot of family coming over to her home. Her hair is also growing back nicely. The patient denies unusual headaches, visual changes, nausea, vomiting, stiff neck, dizziness, or gait imbalance. There has been no cough, phlegm production, or pleurisy, no chest pain or pressure, and no change in bowel or bladder habits. The patient denies fever, rash, bleeding, unexplained fatigue or unexplained weight loss. A detailed review of systems was otherwise entirely negative.    PAST MEDICAL HISTORY: Past Medical History:  Diagnosis Date  . HIV (human immunodeficiency virus infection) ([place])   . Hypertension   . Malignant neoplasm of upper-outer quadrant of left female breast ([place]) 12/11/2015  . Migraine   . Neuropathy   . Personal history of chemotherapy 2018  . Personal history of radiation therapy 2018  . PONV (postoperative nausea and vomiting)    said usually has to have scop patch  . Rash and nonspecific skin eruption 10/16/2015  . Spinal stenosis     PAST SURGICAL HISTORY: Past Surgical History:  Procedure Laterality Date  . ABDOMINAL HYSTERECTOMY  1990  . BREAST BIOPSY Left 12/07/2015  .  BREAST LUMPECTOMY Left 01/01/2016  . BREAST LUMPECTOMY WITH RADIOACTIVE SEED AND SENTINEL LYMPH NODE BIOPSY Left 01/01/2016   Procedure: LEFT BREAST LUMPECTOMY WITH  RADIOACTIVE SEED AND SENTINEL LYMPH NODE BIOPSY;  Surgeon: Alphonsa Overall, MD;  Location: [facility];  Service: General;  Laterality: Left;  . CARPAL TUNNEL RELEASE     both hands  . CERVICAL FUSION  1989  . PORTACATH PLACEMENT Right 01/01/2016   Procedure: INSERTION PORT-A-CATH WITH [name];  Surgeon: Alphonsa Overall, MD;  Location: [facility];  Service: General;  Laterality: Right;  . TONSILLECTOMY      FAMILY HISTORY Family History  Problem Relation Age of Onset  . High blood pressure Mother   . High Cholesterol Mother   . Cancer Father   . High blood pressure Father   . High Cholesterol Father   . Stomach cancer Father        intestinal  . Lung cancer Maternal Grandfather   . Breast cancer Maternal Aunt 93  The patient's father died from heart disease at the age of 9. The patient's mother died from "genetic cirrhosis" at the age of 82. The patient had 2 brothers, 5 sisters. A maternal aunt was diagnosed with breast cancer at age 63. There is no other history of breast or ovarian cancer in the family to the patient's knowledge   GYNECOLOGIC HISTORY:  No LMP recorded. Patient has had a hysterectomy. Menarche age 68, first live birth age 68. Patient is GX P1. She underwent hysterectomy with bilateral salpingo-oophorectomy at age 68. She took hormone replacement for approximately 10 years, until age 68. She also had oral contraceptives remotely for 8-10 years, with no complications.   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 She is currently not working. She now lives by herself. Her husband, [name] who retired from computer work, passed away in 04-Jun-2016. Their son [name] works as a Chief Strategy Officer for rest home in [place]. The patient has 3 grandchildren. She is not a Ambulance person.   ADVANCED DIRECTIVES: The patient has a living will in place   HEALTH MAINTENANCE: Social History   Tobacco Use  . Smoking  status: Never Smoker  . Smokeless tobacco: Never Used  Substance Use Topics  . Alcohol use: Yes    Alcohol/week: 1.0 standard drinks    Types: 1 Glasses of wine per week    Comment: on weekends  . Drug use: No     Colonoscopy: 2012/be routinely  PAP: Status post hysterectomy  Bone density: Never   No Known Allergies  Current Outpatient Medications  Medication Sig Dispense Refill  . acetaminophen (TYLENOL) 500 MG tablet Take 500 mg by mouth every 6 (six) hours as needed.    Marland Kitchen amLODipine (NORVASC) 10 MG tablet Take 1 tablet (10 mg total) by mouth daily. 30 tablet 3  . aspirin EC 81 MG tablet Take 81 mg by mouth daily.    . benazepril (LOTENSIN) 40 MG tablet Take 1 tablet (40 mg total) by mouth daily. 30 tablet 5  . bictegravir-emtricitabine-tenofovir AF (BIKTARVY) 50-200-25 MG TABS tablet Take 1 tablet by mouth daily. 30 tablet 5  . Biotin 5000 MCG CAPS Take 5,000 mcg daily by mouth.    . Coenzyme Q10 300 MG CAPS Take 300 capsules by mouth daily.    . cyclobenzaprine (FLEXERIL) 10 MG tablet Take 1 tablet (10 mg total) by mouth at bedtime as needed. 30 tablet 5  . fexofenadine (  ALLEGRA) 180 MG tablet Take 180 mg daily by mouth.    . gabapentin (NEURONTIN) 100 MG capsule Take 1 capsule (100 mg total) by mouth 2 (two) times daily. 180 capsule 1  . metoprolol tartrate (LOPRESSOR) 25 MG tablet Take 0.5 tablets (12.5 mg total) by mouth 2 (two) times daily. 30 tablet 3  . Multiple Vitamin (MULTIVITAMIN WITH MINERALS) TABS tablet Take 1 tablet by mouth daily.    . Omega-3 Fatty Acids (FISH OIL) 1200 MG CAPS Take 1,200 mg by mouth 2 (two) times daily.      . polyethylene glycol (MIRALAX / GLYCOLAX) packet Take 17 g by mouth daily.    . rosuvastatin (CRESTOR) 5 MG tablet Take 1 tablet (5 mg total) by mouth at bedtime. 30 tablet 5   No current facility-administered medications for this visit.    Facility-Administered Medications Ordered in Other Visits  Medication Dose Route Frequency  Provider Last Rate Last Dose  . sodium chloride flush (NS) 0.9 % injection 10 mL  10 mL Intravenous PRN Magrinat, Virgie Dad, MD   10 mL at 05/21/16 1022    OBJECTIVE: Middle-aged white woman in no acute distress  Vitals:   01/29/18 0915  BP: (!) 145/80  Pulse: 80  Resp: 18  Temp: 98.4 F (36.9 C)  SpO2: 100%     Body mass index is 26.53 kg/m.    ECOG FS:0 - Asymptomatic   Sclerae unicteric, EOMs intact Oropharynx clear and moist No cervical or supraclavicular adenopathy Lungs no rales or rhonchi Heart regular rate and rhythm Abd soft, nontender, positive bowel sounds MSK no focal spinal tenderness, no upper extremity lymphedema Neuro: nonfocal, well oriented, appropriate affect Breasts: The right breast is status post lumpectomy and radiation.  There is no evidence of disease recurrence.  The left breast is benign.  Both axillae are benign.      LAB RESULTS:  CMP     Component Value Date/Time   NA 142 01/06/2018 0918   NA 140 12/31/2016 1059   K 4.1 01/06/2018 0918   K 3.9 12/31/2016 1059   CL 103 01/06/2018 0918   CO2 31 01/06/2018 0918   CO2 28 12/31/2016 1059   GLUCOSE 86 01/06/2018 0918   GLUCOSE 75 12/31/2016 1059   BUN 12 01/06/2018 0918   BUN 11.3 12/31/2016 1059   CREATININE 0.85 01/06/2018 0918   CREATININE 0.9 12/31/2016 1059   CALCIUM 10.1 01/06/2018 0918   CALCIUM 9.6 12/31/2016 1059   PROT 7.6 01/06/2018 0918   PROT 7.3 12/31/2016 1059   ALBUMIN 4.4 09/24/2017 0955   ALBUMIN 4.2 12/31/2016 1059   AST 32 01/06/2018 0918   AST 26 09/24/2017 0955   AST 31 12/31/2016 1059   ALT 28 01/06/2018 0918   ALT 22 09/24/2017 0955   ALT 28 12/31/2016 1059   ALKPHOS 95 09/24/2017 0955   ALKPHOS 80 12/31/2016 1059   BILITOT 0.5 01/06/2018 0918   BILITOT 0.4 09/24/2017 0955   BILITOT 0.40 12/31/2016 1059   GFRNONAA 71 01/06/2018 0918   GFRAA 83 01/06/2018 0918    INo results found for: SPEP, UPEP  Lab Results  Component Value Date   WBC 6.3  01/29/2018   NEUTROABS 3.5 01/29/2018   HGB 15.3 (H) 01/29/2018   HCT 44.8 01/29/2018   MCV 94.9 01/29/2018   PLT 242 01/29/2018      Chemistry      Component Value Date/Time   NA 142 01/06/2018 0918   NA 140 12/31/2016 1059  K 4.1 01/06/2018 0918   K 3.9 12/31/2016 1059   CL 103 01/06/2018 0918   CO2 31 01/06/2018 0918   CO2 28 12/31/2016 1059   BUN 12 01/06/2018 0918   BUN 11.3 12/31/2016 1059   CREATININE 0.85 01/06/2018 0918   CREATININE 0.9 12/31/2016 1059      Component Value Date/Time   CALCIUM 10.1 01/06/2018 0918   CALCIUM 9.6 12/31/2016 1059   ALKPHOS 95 09/24/2017 0955   ALKPHOS 80 12/31/2016 1059   AST 32 01/06/2018 0918   AST 26 09/24/2017 0955   AST 31 12/31/2016 1059   ALT 28 01/06/2018 0918   ALT 22 09/24/2017 0955   ALT 28 12/31/2016 1059   BILITOT 0.5 01/06/2018 0918   BILITOT 0.4 09/24/2017 0955   BILITOT 0.40 12/31/2016 1059       No results found for: LABCA2  No components found for: LABCA125  No results for input(s): INR in the last 168 hours.  Urinalysis    Component Value Date/Time   COLORURINE YELLOW 06/26/2017 [place] 06/26/2017 1205   LABSPEC 1.008 06/26/2017 1205   PHURINE 5.5 06/26/2017 1205   GLUCOSEU NEGATIVE 06/26/2017 1205   [place] 06/26/2017 1205   BILIRUBINUR NEGATIVE 03/21/2007 1108   [place] 06/26/2017 [place] 06/26/2017 1205   UROBILINOGEN 1.0 03/21/2007 1108   NITRITE NEGATIVE 06/26/2017 1205   LEUKOCYTESUR TRACE (A) 06/26/2017 1205     STUDIES: New mammogram reviewed with the patient   ELIGIBLE FOR AVAILABLE RESEARCH PROTOCOL: no  ASSESSMENT: 68 y.o. [place] woman status post left breast upper outer quadrant biopsy 12/07/2015 for a clinical T2 N0, stage IIA invasive ductal carcinoma, grade 3, triple negative, with an MIB-1 of 35%  (1) genetics testing 01/23/2016  (a) alpha -1- anti trypsin and ceruloplamin labs drawn 01/19/2016, both  normal  (2) Status post left lumpectomy/ sentinel lymph node sampling 01/01/2016 for a pT1c pN0, stage IA  Invasive ductal carcinoma, grade 3, with negative margins.  (3) Mammaprint sent from the patient's 12/07/2015 biopsy returned "high risk", predicting a risk of recurrence of nearly 30% untreated, decreasing to a 93% chance of five-year distant metastasis free survival with chemotherapy.  (4) adjuvant chemotherapy consisting of cyclophosphamide and doxorubicin in dose dense fashion 4 started 02/01/2016, completed 03/12/2016, followed by weekly Abraxane 12 starting 04/02/2016,  (a) changed on 04/30/16 to Abraxane days 1,8,15, with Neulasta support on day 16 of each 28 day cycle,   (b) then changed on 4/17 to Abraxane days 1, 8, on a 21 day cycle with Neulasta on day 9 due to neutropenia  (c) taxanes stopped after 9 doses due to neutropenia, last dose 06/18/2016  (5) adjuvant radiation 07/29/2016-09/12/2016 Site/dose:   1.) Breast, Left 1.8 Gy in 28 fractions                         2.) Breast, Left Boost 2 Gy in 5 fractions   (6) HIV positivity: Followed by Dr. Graylon Good. June 2018 labs show a T4 abs 450, percentage 31%, HIV RNA undetectable  PLAN: Liz is now a little over 2 years out from definitive surgery for her breast cancer with no evidence of disease recurrence.  This is particularly favorable since in estrogen receptor negative breast cancers the first 2 years are particularly risk in terms of recurrence.  She has largely recovered from the side effects of chemotherapy and is feeling much more like her normal self.  Her CD4 count is also much improved  At this point I feel comfortable seeing her on a once a year basis.  If she has any other issues that she thinks may be cancer related of course I will see her before her next visit which will otherwise be January 2021.  Wilber Bihari, NP 01/29/18 9:48 AM Medical Oncology and Hematology [facility] [address] [place], [place] [address] Tel. [phone]    Fax. [phone]   I, Wilburn Mylar, am acting as scribe for Dr. Virgie Dad. Magrinat.  I, Lurline Del MD, have reviewed the above documentation for accuracy and completeness, and I agree with the above.

## 2018-01-29 NOTE — Telephone Encounter (Signed)
Gave avs and calendar ° °

## 2018-02-11 ENCOUNTER — Other Ambulatory Visit: Payer: Self-pay | Admitting: Pharmacist

## 2018-02-11 DIAGNOSIS — I10 Essential (primary) hypertension: Secondary | ICD-10-CM

## 2018-02-12 MED FILL — ROSUVASTATIN CALCIUM 5 MG T: 5 | 30 days supply | Qty: 30 | Fill #4

## 2018-02-12 MED FILL — METOPROLOL TARTRATE 25 MG T: 25 | 30 days supply | Qty: 30 | Fill #0

## 2018-02-12 MED FILL — BENAZEPRIL HCL 40 MG TABLET: 40 | 30 days supply | Qty: 30 | Fill #4

## 2018-02-12 MED FILL — AMLODIPINE BESYLATE 10 MG T: 10 | 30 days supply | Qty: 30 | Fill #0

## 2018-02-12 MED FILL — BIKTARVY 50-200-25 MG TABS: 50-200-25 | 30 days supply | Qty: 30 | Fill #4

## 2018-03-06 ENCOUNTER — Other Ambulatory Visit: Payer: Self-pay | Admitting: Pharmacist

## 2018-03-06 DIAGNOSIS — G620 Drug-induced polyneuropathy: Secondary | ICD-10-CM

## 2018-03-06 DIAGNOSIS — T451X5A Adverse effect of antineoplastic and immunosuppressive drugs, initial encounter: Principal | ICD-10-CM

## 2018-03-10 MED FILL — CYCLOBENZAPRINE HCL 10 MG T: 10 | 30 days supply | Qty: 30 | Fill #3

## 2018-03-10 MED FILL — ROSUVASTATIN CALCIUM 5 MG T: 5 | 30 days supply | Qty: 30 | Fill #5

## 2018-03-10 MED FILL — METOPROLOL TARTRATE 25 MG T: 25 | 30 days supply | Qty: 30 | Fill #1

## 2018-03-10 MED FILL — BENAZEPRIL HCL 40 MG TABLET: 40 | 30 days supply | Qty: 30 | Fill #5

## 2018-03-10 MED FILL — AMLODIPINE BESYLATE 10 MG T: 10 | 30 days supply | Qty: 30 | Fill #1

## 2018-03-10 MED FILL — BIKTARVY 50-200-25 MG TABS: 50-200-25 | 30 days supply | Qty: 30 | Fill #5

## 2018-04-02 ENCOUNTER — Other Ambulatory Visit: Payer: Self-pay | Admitting: Pharmacist

## 2018-04-02 DIAGNOSIS — B20 Human immunodeficiency virus [HIV] disease: Secondary | ICD-10-CM

## 2018-04-02 DIAGNOSIS — I10 Essential (primary) hypertension: Secondary | ICD-10-CM

## 2018-04-02 DIAGNOSIS — E7800 Pure hypercholesterolemia, unspecified: Secondary | ICD-10-CM

## 2018-04-07 ENCOUNTER — Other Ambulatory Visit: Payer: Self-pay | Admitting: Behavioral Health

## 2018-04-07 DIAGNOSIS — I10 Essential (primary) hypertension: Secondary | ICD-10-CM

## 2018-04-07 DIAGNOSIS — E7800 Pure hypercholesterolemia, unspecified: Secondary | ICD-10-CM

## 2018-04-07 MED ORDER — ROSUVASTATIN CALCIUM 5 MG PO TABS: 5.0000 mg | ORAL_TABLET | Freq: Every day | ORAL | 1 refills | Status: DC

## 2018-04-07 MED ORDER — BENAZEPRIL HCL 40 MG PO TABS: 40.0000 mg | ORAL_TABLET | Freq: Every day | ORAL | 1 refills | Status: DC

## 2018-04-07 MED FILL — BIKTARVY 50-200-25 MG TABS: 50-200-25 | 30 days supply | Qty: 30 | Fill #0

## 2018-04-07 MED FILL — GABAPENTIN 100 MG CAPSULE: 100 | 90 days supply | Qty: 180 | Fill #0

## 2018-04-07 MED FILL — AMLODIPINE BESYLATE 10 MG T: 10 | 30 days supply | Qty: 30 | Fill #2

## 2018-04-07 MED FILL — ROSUVASTATIN CALCIUM 5 MG T: 5 | 30 days supply | Qty: 30 | Fill #0

## 2018-04-07 MED FILL — BENAZEPRIL HCL 40 MG TABLET: 40 | 30 days supply | Qty: 30 | Fill #0

## 2018-04-07 MED FILL — METOPROLOL TARTRATE 25 MG T: 25 | 30 days supply | Qty: 30 | Fill #2

## 2018-04-30 ENCOUNTER — Other Ambulatory Visit: Payer: Self-pay | Admitting: Internal Medicine

## 2018-04-30 DIAGNOSIS — I10 Essential (primary) hypertension: Secondary | ICD-10-CM

## 2018-04-30 MED FILL — AMLODIPINE BESYLATE 10 MG T: 10 | 30 days supply | Qty: 30 | Fill #0

## 2018-04-30 MED FILL — METOPROLOL TARTRATE 25 MG T: 25 | 30 days supply | Qty: 30 | Fill #0

## 2018-04-30 MED FILL — CYCLOBENZAPRINE HCL 10 MG T: 10 | 30 days supply | Qty: 30 | Fill #4

## 2018-04-30 MED FILL — BENAZEPRIL HCL 40 MG TABLET: 40 | 90 days supply | Qty: 90 | Fill #0

## 2018-04-30 MED FILL — ROSUVASTATIN CALCIUM 5 MG T: 5 | 90 days supply | Qty: 90 | Fill #0

## 2018-04-30 MED FILL — BIKTARVY 50-200-25 MG TABS: 50-200-25 | 30 days supply | Qty: 30 | Fill #1

## 2018-05-21 ENCOUNTER — Other Ambulatory Visit: Payer: Medicare Other

## 2018-05-29 MED FILL — AMLODIPINE BESYLATE 10 MG T: 10 | 30 days supply | Qty: 30 | Fill #1

## 2018-05-29 MED FILL — METOPROLOL TARTRATE 25 MG T: 25 | 30 days supply | Qty: 30 | Fill #1

## 2018-05-29 MED FILL — CYCLOBENZAPRINE HCL 10 MG T: 10 | 30 days supply | Qty: 30 | Fill #5

## 2018-05-29 MED FILL — BIKTARVY 50-200-25 MG TABS: 50-200-25 | 30 days supply | Qty: 30 | Fill #2

## 2018-06-08 ENCOUNTER — Encounter: Payer: Medicare Other | Admitting: Internal Medicine

## 2018-06-18 ENCOUNTER — Other Ambulatory Visit: Payer: Self-pay | Admitting: Pharmacist

## 2018-06-18 DIAGNOSIS — M62838 Other muscle spasm: Secondary | ICD-10-CM

## 2018-06-29 MED FILL — BIKTARVY 50-200-25 MG TABS: 50-200-25 | 30 days supply | Qty: 30 | Fill #3

## 2018-06-29 MED FILL — CYCLOBENZAPRINE HCL 10 MG T: 10 | 30 days supply | Qty: 30 | Fill #0

## 2018-06-29 MED FILL — AMLODIPINE BESYLATE 10 MG T: 10 | 30 days supply | Qty: 30 | Fill #2

## 2018-06-29 MED FILL — METOPROLOL TARTRATE 25 MG T: 25 | 30 days supply | Qty: 30 | Fill #2

## 2018-07-22 ENCOUNTER — Other Ambulatory Visit: Payer: Self-pay | Admitting: Internal Medicine

## 2018-07-22 DIAGNOSIS — I10 Essential (primary) hypertension: Secondary | ICD-10-CM

## 2018-07-27 MED FILL — AMLODIPINE BESYLATE 10 MG T: 10 | 30 days supply | Qty: 30 | Fill #0

## 2018-07-27 MED FILL — BIKTARVY 50-200-25 MG TABS: 50-200-25 | 30 days supply | Qty: 30 | Fill #4

## 2018-07-27 MED FILL — METOPROLOL TARTRATE 25 MG T: 25 | 30 days supply | Qty: 30 | Fill #0

## 2018-07-27 MED FILL — BENAZEPRIL HCL 40 MG TABLET: 40 | 90 days supply | Qty: 90 | Fill #1

## 2018-07-27 MED FILL — ROSUVASTATIN CALCIUM 5 MG T: 5 | 90 days supply | Qty: 90 | Fill #1

## 2018-07-27 MED FILL — GABAPENTIN 100 MG CAPSULE: 100 | 90 days supply | Qty: 180 | Fill #1

## 2018-07-27 MED FILL — CYCLOBENZAPRINE HCL 10 MG T: 10 | 30 days supply | Qty: 30 | Fill #1

## 2018-08-04 ENCOUNTER — Other Ambulatory Visit: Payer: Medicare Other

## 2018-08-04 ENCOUNTER — Other Ambulatory Visit: Payer: Self-pay

## 2018-08-04 DIAGNOSIS — B20 Human immunodeficiency virus [HIV] disease: Secondary | ICD-10-CM | POA: Diagnosis not present

## 2018-08-05 LAB — T-HELPER CELL (CD4) - (RCID CLINIC ONLY)
CD4 % Helper T Cell: 25 % — ABNORMAL LOW (ref 33–65)
CD4 T Cell Abs: 619 /uL (ref 400–1790)

## 2018-08-12 LAB — HIV-1 RNA QUANT-NO REFLEX-BLD
HIV 1 RNA Quant: <20 copies/mL
HIV-1 RNA Quant, Log: <1.3 Log copies/mL

## 2018-08-18 ENCOUNTER — Encounter: Payer: Self-pay | Admitting: Internal Medicine

## 2018-08-18 ENCOUNTER — Ambulatory Visit (INDEPENDENT_AMBULATORY_CARE_PROVIDER_SITE_OTHER): Payer: Medicare Other | Admitting: Internal Medicine

## 2018-08-18 DIAGNOSIS — B20 Human immunodeficiency virus [HIV] disease: Secondary | ICD-10-CM | POA: Diagnosis not present

## 2018-08-18 DIAGNOSIS — I10 Essential (primary) hypertension: Secondary | ICD-10-CM

## 2018-08-18 DIAGNOSIS — M62838 Other muscle spasm: Secondary | ICD-10-CM

## 2018-08-18 MED ORDER — METOPROLOL TARTRATE 25 MG PO TABS: ORAL_TABLET | ORAL | 1 refills | Status: DC

## 2018-08-18 MED ORDER — AMLODIPINE BESYLATE 10 MG PO TABS: 10.0000 mg | ORAL_TABLET | Freq: Every day | ORAL | 1 refills | Status: DC

## 2018-08-18 MED ORDER — CYCLOBENZAPRINE HCL 10 MG PO TABS: 10.0000 mg | ORAL_TABLET | Freq: Every evening | ORAL | 1 refills | Status: DC | PRN

## 2018-08-18 MED ORDER — BIKTARVY 50-200-25 MG PO TABS: 1.0000 | ORAL_TABLET | Freq: Every day | ORAL | 1 refills | Status: DC

## 2018-08-18 NOTE — Progress Notes (Addendum)
Rfv: tele-visit for HIV disease, (audio only)- patient consented for televisit by phone, she was at home/while I was using office phone to contact her.  Patient ID: [name], female   DOB: 1950/09/13, 68 y.o.   MRN: [id number]  HPI [name] is a 68yo F with HIV disease, hx of breast cancer, HTN doing well, having good state of health. Has had good adherence. No recent illnesses  Needs refills on medication. 3 month supply  Just hangs out with her son-who works from home  No sick contact/covid-19  Outpatient Encounter Medications as of 08/18/2018  Medication Sig  . acetaminophen (TYLENOL) 500 MG tablet Take 500 mg by mouth every 6 (six) hours as needed.  Marland Kitchen amLODipine (NORVASC) 10 MG tablet Take 1 tablet (10 mg total) by mouth daily.  Marland Kitchen aspirin EC 81 MG tablet Take 81 mg by mouth daily.  . benazepril (LOTENSIN) 40 MG tablet Take 1 tablet (40 mg total) by mouth daily.  . bictegravir-emtricitabine-tenofovir AF (BIKTARVY) 50-200-25 MG TABS tablet Take 1 tablet by mouth daily.  . Biotin 5000 MCG CAPS Take 5,000 mcg daily by mouth.  . Coenzyme Q10 300 MG CAPS Take 300 capsules by mouth daily.  . cyclobenzaprine (FLEXERIL) 10 MG tablet Take 1 tablet (10 mg total) by mouth at bedtime as needed.  . fexofenadine (ALLEGRA) 180 MG tablet Take 180 mg daily by mouth.  . gabapentin (NEURONTIN) 100 MG capsule TAKE 1 CAPSULE (100 MG TOTAL) BY MOUTH 2 (TWO) TIMES DAILY.  . metoprolol tartrate (LOPRESSOR) 25 MG tablet TAKE 1/2 TABLET BY MOUTH 2 TIMES DAILY.  . Multiple Vitamin (MULTIVITAMIN WITH MINERALS) TABS tablet Take 1 tablet by mouth daily.  . Omega-3 Fatty Acids (FISH OIL) 1200 MG CAPS Take 1,200 mg by mouth 2 (two) times daily.    . polyethylene glycol (MIRALAX / GLYCOLAX) packet Take 17 g by mouth daily.  . rosuvastatin (CRESTOR) 5 MG tablet Take 1 tablet (5 mg total) by mouth at bedtime.  . [DISCONTINUED] amLODipine (NORVASC) 10 MG tablet TAKE 1 TABLET BY MOUTH DAILY.  .  [DISCONTINUED] BIKTARVY 50-200-25 MG TABS tablet TAKE 1 TABLET BY MOUTH DAILY.  . [DISCONTINUED] cyclobenzaprine (FLEXERIL) 10 MG tablet TAKE 1 TABLET (10 MG TOTAL) BY MOUTH AT BEDTIME AS NEEDED.  . [DISCONTINUED] metoprolol tartrate (LOPRESSOR) 25 MG tablet TAKE 1/2 TABLET BY MOUTH 2 TIMES DAILY.   Facility-Administered Encounter Medications as of 08/18/2018  Medication  . sodium chloride flush (NS) 0.9 % injection 10 mL     Patient Active Problem List   Diagnosis Date Noted  . Aortic atherosclerosis ([place]) 10/07/2017  . Oral thrush 06/26/2017  . Dry mouth & dry eyes  06/26/2017  . Muscle spasms of neck 11/25/2016  . Drug-induced neutropenia ([place]) 04/23/2016  . Malignant neoplasm of upper-outer quadrant of left breast in female, estrogen receptor negative ([place]) 12/11/2015  . Hereditary and idiopathic peripheral neuropathy 07/16/2012  . Lumbar neuritis 07/16/2012  . Cervicalgia 07/16/2012  . URI 12/02/2006  . Human immunodeficiency virus (HIV) disease ([place]) 11/08/2005  . HYPERLIPIDEMIA 11/08/2005  . Essential hypertension 11/08/2005  . BRONCHITIS 11/08/2005  . Regional enteritis ([place]) 11/08/2005  . SYMPTOM, PAINFUL RESPIRATION 11/08/2005     Health Maintenance Due  Topic Date Due  . COLONOSCOPY  11/20/2001  . DEXA SCAN  11/20/2016  . PNA vac Low Risk Adult (2 of 2 - PPSV23) 05/28/2018     Review of Systems  Constitutional: Negative for fever, chills, diaphoresis, activity change, appetite change, fatigue  and unexpected weight change.  HENT: Negative for congestion, sore throat, rhinorrhea, sneezing, trouble swallowing and sinus pressure.  Eyes: Negative for photophobia and visual disturbance.  Respiratory: Negative for cough, chest tightness, shortness of breath, wheezing and stridor.  Cardiovascular: Negative for chest pain, palpitations and leg swelling.  Gastrointestinal: Negative for nausea, vomiting, abdominal pain, diarrhea, constipation, blood in stool, abdominal  distention and anal bleeding.  Genitourinary: Negative for dysuria, hematuria, flank pain and difficulty urinating.  Musculoskeletal: Negative for myalgias, back pain, joint swelling, arthralgias and gait problem.  Skin: Negative for color change, pallor, rash and wound.  Neurological: Negative for dizziness, tremors, weakness and light-headedness.  Hematological: Negative for adenopathy. Does not bruise/bleed easily.  Psychiatric/Behavioral: Negative for behavioral problems, confusion, sleep disturbance, dysphoric mood, decreased concentration and agitation.    Physical Exam   There were no vitals taken for this visit. Tele visit  Lab Results  Component Value Date   CD4TCELL 25 (L) 08/04/2018   Lab Results  Component Value Date   CD4TABS 619 08/04/2018   CD4TABS 490 01/06/2018   CD4TABS 470 08/11/2017   Lab Results  Component Value Date   HIV1RNAQUANT <20 NOT DETECTED 08/04/2018   Lab Results  Component Value Date   HEPBSAB No 03/24/2006   Lab Results  Component Value Date   LABRPR NON-REACTIVE 02/05/2017    CBC Lab Results  Component Value Date   WBC 6.3 01/29/2018   RBC 4.72 01/29/2018   HGB 15.3 (H) 01/29/2018   HCT 44.8 01/29/2018   PLT 242 01/29/2018   MCV 94.9 01/29/2018   MCH 32.4 01/29/2018   MCHC 34.2 01/29/2018   RDW 12.3 01/29/2018   LYMPHSABS 2.0 01/29/2018   MONOABS 0.6 01/29/2018   EOSABS 0.1 01/29/2018    BMET Lab Results  Component Value Date   NA 141 01/29/2018   K 3.5 01/29/2018   CL 104 01/29/2018   CO2 27 01/29/2018   GLUCOSE 95 01/29/2018   BUN 13 01/29/2018   CREATININE 0.94 01/29/2018   CALCIUM 9.8 01/29/2018   GFRNONAA >60 01/29/2018   GFRAA >60 01/29/2018     Assessment and Plan   hiv disease = well controlled. Continue on current regimen. Will give refill  htn = previously well controlled. Anticipate to continue on amlodipine and metoprolol -give refill  Muscle tightness = will continue with muscle relaxant as  needed. Will give refills  rtc in 61mo

## 2018-08-24 MED FILL — CYCLOBENZAPRINE HCL 10 MG T: 10 | 90 days supply | Qty: 90 | Fill #0

## 2018-08-24 MED FILL — METOPROLOL TARTRATE 25 MG T: 25 | 90 days supply | Qty: 90 | Fill #0

## 2018-08-24 MED FILL — BIKTARVY 50-200-25 MG TABS: 50-200-25 | 30 days supply | Qty: 30 | Fill #0

## 2018-08-24 MED FILL — AMLODIPINE BESYLATE 10 MG T: 10 | 90 days supply | Qty: 90 | Fill #0

## 2018-09-28 IMAGING — CR DG CHEST 1V PORT
1 series · 1 of 1 positions shown · non-contrast
Comparison: 03/21/2007

CLINICAL DATA: Postop Port-A-Cath insertion.

EXAM:
PORTABLE CHEST 1 VIEW

[AP]
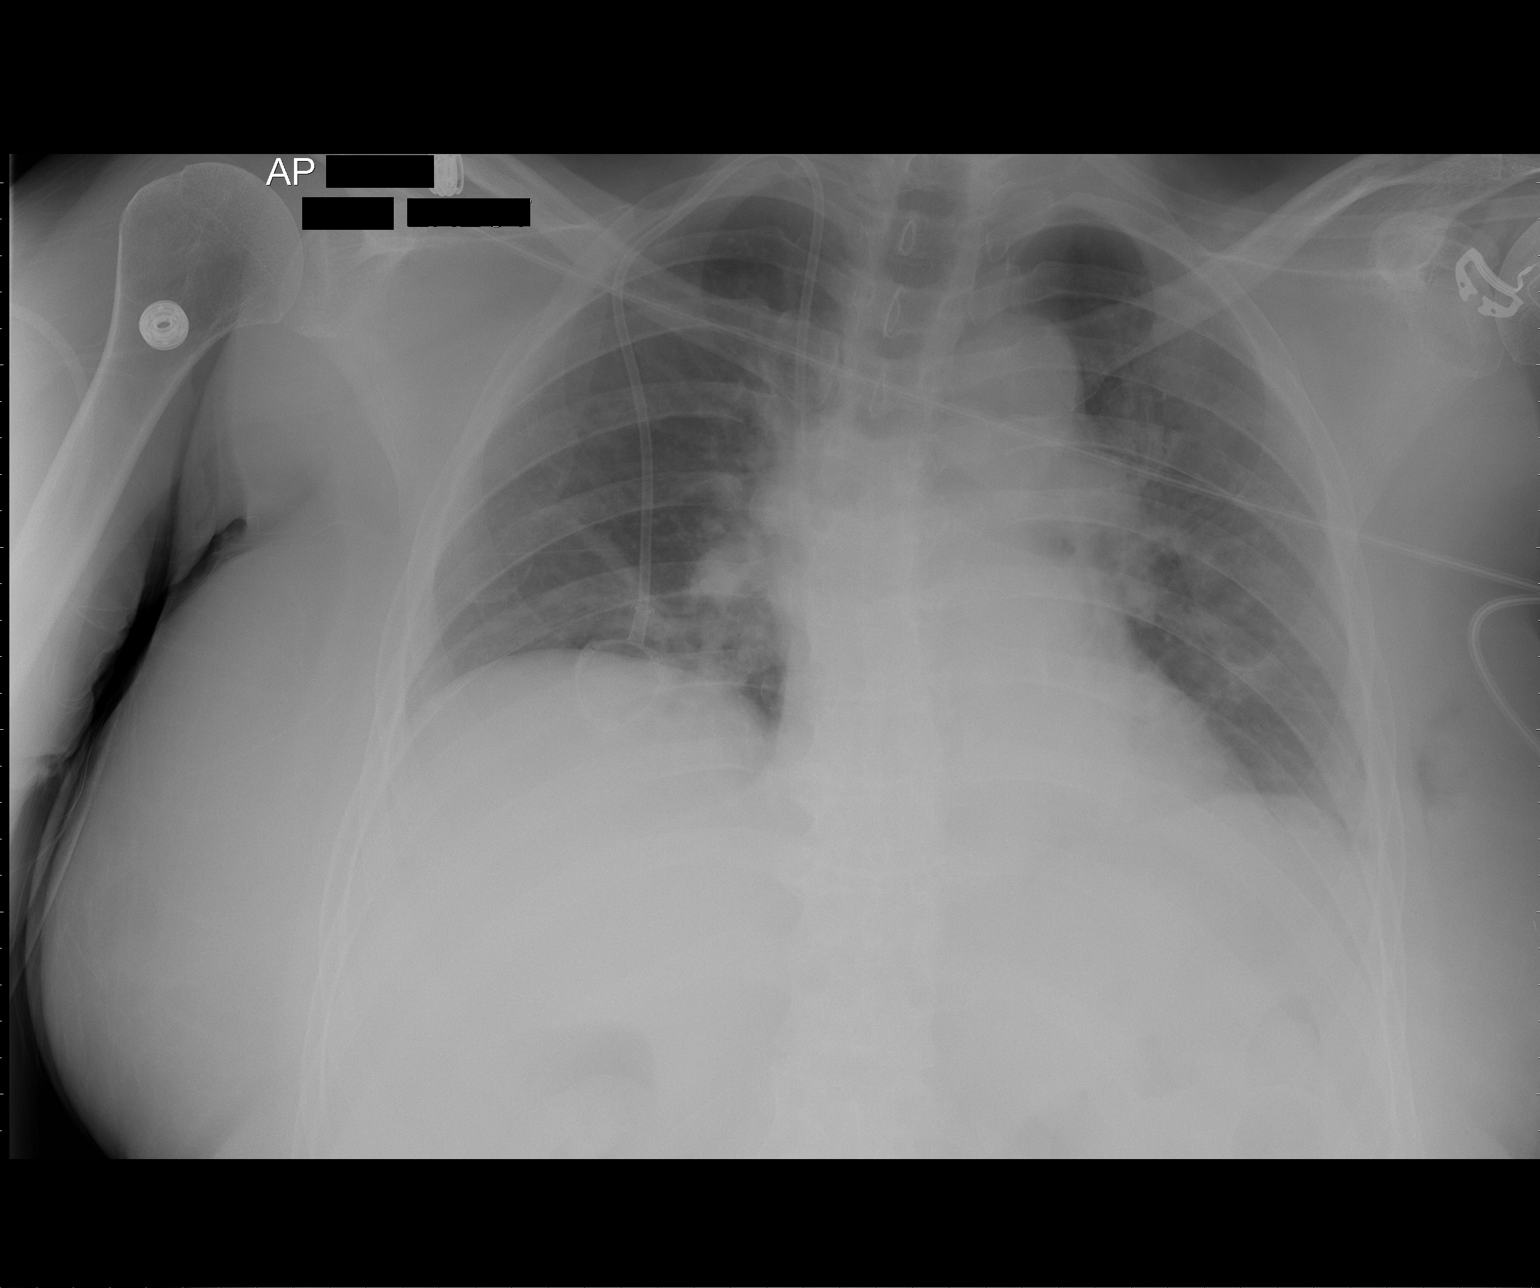

[1 of 1 positions shown; findings below may reference images not displayed]

FINDINGS: A right jugular Port-A-Cath has been placed. The catheter tip is
suboptimally visualized due to superimposed spine and mediastinal
structures, however the catheter appears to terminate over the mid
to lower SVC. The cardiac silhouette is normal in size. Lung volumes
are low with bronchovascular crowding. No overt pulmonary edema,
pleural effusion, or pneumothorax is identified. No acute osseous
abnormality is seen.
IMPRESSION: 1. Port-A-Cath placement as above.
2. Low lung volumes.  No pneumothorax.

## 2018-09-28 MED FILL — BIKTARVY 50-200-25 MG TABS: 50-200-25 | 30 days supply | Qty: 30 | Fill #1

## 2018-10-12 ENCOUNTER — Other Ambulatory Visit: Payer: Self-pay | Admitting: Oncology

## 2018-10-12 DIAGNOSIS — Z9889 Other specified postprocedural states: Secondary | ICD-10-CM

## 2018-10-12 DIAGNOSIS — Z1231 Encounter for screening mammogram for malignant neoplasm of breast: Secondary | ICD-10-CM

## 2018-10-12 DIAGNOSIS — Z853 Personal history of malignant neoplasm of breast: Secondary | ICD-10-CM

## 2018-10-19 ENCOUNTER — Telehealth: Payer: Self-pay | Admitting: Pharmacy Technician

## 2018-10-19 NOTE — Telephone Encounter (Signed)
[facility] Patient Advocate Encounter   I was successful in securing patient a $7500 grant from [facility] [place] (PAF) to provide copayment coverage for Biktarvy.  This will make the out of pocket cost $0.     I have spoken with the patient.    The billing information is as follows and has been shared with [place].   Fund: HIV, AIDS, and Prevention Award Period: - 10/19/2019 Cardholder: [id number] BIN: [id number] PCN: [id number] Group: [id number]  Patient knows to call the office with questions or concerns.  [name]. [name] Phone: [phone] Fax:  [phone]

## 2018-10-22 ENCOUNTER — Other Ambulatory Visit: Payer: Self-pay | Admitting: Internal Medicine

## 2018-10-22 DIAGNOSIS — E7800 Pure hypercholesterolemia, unspecified: Secondary | ICD-10-CM

## 2018-10-22 DIAGNOSIS — I10 Essential (primary) hypertension: Secondary | ICD-10-CM

## 2018-10-22 DIAGNOSIS — G620 Drug-induced polyneuropathy: Secondary | ICD-10-CM

## 2018-10-26 MED FILL — BENAZEPRIL HCL 40 MG TABLET: 40 | 90 days supply | Qty: 90 | Fill #0

## 2018-10-26 MED FILL — BIKTARVY 50-200-25 MG TABS: 50-200-25 | 30 days supply | Qty: 30 | Fill #2

## 2018-10-26 MED FILL — GABAPENTIN 100 MG CAPSULE: 100 | 90 days supply | Qty: 180 | Fill #0

## 2018-10-26 MED FILL — ROSUVASTATIN CALCIUM 5 MG T: 5 | 90 days supply | Qty: 90 | Fill #0

## 2018-11-23 MED FILL — BIKTARVY 50-200-25 MG TABS: 50-200-25 | 30 days supply | Qty: 30 | Fill #3

## 2018-11-23 MED FILL — CYCLOBENZAPRINE HCL 10 MG T: 10 | 90 days supply | Qty: 90 | Fill #1

## 2018-11-23 MED FILL — METOPROLOL TARTRATE 25 MG T: 25 | 90 days supply | Qty: 90 | Fill #1

## 2018-11-23 MED FILL — AMLODIPINE BESYLATE 10 MG T: 10 | 90 days supply | Qty: 90 | Fill #1

## 2018-11-24 ENCOUNTER — Ambulatory Visit (INDEPENDENT_AMBULATORY_CARE_PROVIDER_SITE_OTHER): Payer: Medicare Other

## 2018-11-24 ENCOUNTER — Other Ambulatory Visit: Payer: Self-pay

## 2018-11-24 DIAGNOSIS — Z23 Encounter for immunization: Secondary | ICD-10-CM

## 2018-12-17 MED FILL — BIKTARVY 50-200-25 MG TABS: 50-200-25 | 30 days supply | Qty: 30 | Fill #4

## 2019-01-01 ENCOUNTER — Other Ambulatory Visit: Payer: Self-pay

## 2019-01-01 ENCOUNTER — Ambulatory Visit
Admission: RE | Admit: 2019-01-01 | Discharge: 2019-01-01 | Disposition: A | Discharge status: Home / Self Care | Payer: Medicare Other | Source: Ambulatory Visit | Attending: Oncology | Admitting: Oncology

## 2019-01-01 DIAGNOSIS — Z853 Personal history of malignant neoplasm of breast: Secondary | ICD-10-CM

## 2019-01-01 DIAGNOSIS — Z9889 Other specified postprocedural states: Secondary | ICD-10-CM

## 2019-01-01 DIAGNOSIS — Z1231 Encounter for screening mammogram for malignant neoplasm of breast: Secondary | ICD-10-CM

## 2019-01-01 DIAGNOSIS — R928 Other abnormal and inconclusive findings on diagnostic imaging of breast: Secondary | ICD-10-CM | POA: Diagnosis not present

## 2019-01-13 MED FILL — BIKTARVY 50-200-25 MG TABS: 50-200-25 | 30 days supply | Qty: 30 | Fill #5

## 2019-01-20 MED FILL — BENAZEPRIL HCL 40 MG TABLET: 40 | 90 days supply | Qty: 90 | Fill #1

## 2019-01-20 MED FILL — ROSUVASTATIN CALCIUM 5 MG T: 5 | 90 days supply | Qty: 90 | Fill #1

## 2019-01-20 MED FILL — GABAPENTIN 100 MG CAPSULE: 100 | 90 days supply | Qty: 180 | Fill #1

## 2019-01-28 ENCOUNTER — Other Ambulatory Visit: Payer: Self-pay | Admitting: *Deleted

## 2019-01-28 DIAGNOSIS — Z171 Estrogen receptor negative status [ER-]: Secondary | ICD-10-CM

## 2019-01-28 DIAGNOSIS — C50412 Malignant neoplasm of upper-outer quadrant of left female breast: Secondary | ICD-10-CM

## 2019-01-31 NOTE — Progress Notes (Signed)
[facility]  [phone] [phone]     ID: [name] DOB: 01-05-1951  MR#: [id number]  HYI#:[id number]  Patient Care Team: Carlyle Basques, MD as PCP - General (Infectious Diseases) Carlyle Basques, MD as PCP - Infectious Diseases (Infectious Diseases) Alphonsa Overall, MD as Consulting Physician (General Surgery) Madelein Mahadeo, Virgie Dad, MD as Consulting Physician (Oncology) Gery Pray, MD as Consulting Physician (Radiation Oncology) Marcial Pacas, MD as Consulting Physician (Neurology) Druscilla Brownie, MD as Consulting Physician (Dermatology) Delice Bison, Charlestine Massed, NP as Nurse Practitioner (Hematology and Oncology) Christianne Borrow, DMD (Dentistry) OTHER MD:  CHIEF COMPLAINT: Triple negative breast cancer  CURRENT TREATMENT: Observation   INTERVAL HISTORY: [name] returns today for follow-up of her estrogen receptor negative breast cancer. She continues under observation.  Since her last visit, she underwent bilateral diagnostic mammography with tomography at [facility] on 01/01/2019 showing: breast density category B; no evidence of malignancy in either breast.   She continues to f/u with her PCP, along with Dr. Baxter Flattery.  Her most recent CD4 counts were excellent as listed below.  REVIEW OF SYSTEMS: Shanya is keeping appropriate pandemic precautions.  Both she and her son who lives with her (he has no children) are quarantining themselves.  He is currently working from home.  She is very concerned because she has developed some "skin bumps" in the left breast.  These are discussed below.  She walks 30 minutes about 5 days a week.  A detailed review of systems today was otherwise stable.   BREAST CANCER HISTORY:  From the original intake note:  [name] herself palpated a mass in the upper outer quadrant of her left breast the first week in November, and had bilateral diagnostic mammography with tomography and left breast ultrasonography  at the [facility] 12/05/2015. The breast density was category C. In the upper outer left breast there was  A mass with macro lobulated contours which was barely palpable at the 12:30 o'clock position of the left breast 7 cm from the nipple. There was no palpable axillary adenopathy. Ultrasonography confirmed a 2.1 cm obliquely oriented hypoechoic mass in the area in question. Ultrasound of the left axilla was benign.  Biopsy of the left breast mass in question 12/07/2015 showed ([id number]) invasive ductal carcinoma, grade 3, estrogen and progesterone receptor negative, with an MIB-1 of 35%, and no HER-2 amplification, the signals ratio being 0.95 and the number per cell 2.80.  Her subsequent history is as detailed below   PAST MEDICAL HISTORY: Past Medical History:  Diagnosis Date  . HIV (human immunodeficiency virus infection) ([place])   . Hypertension   . Malignant neoplasm of upper-outer quadrant of left female breast ([place]) 12/11/2015  . Migraine   . Neuropathy   . Personal history of chemotherapy 2018  . Personal history of radiation therapy 2018  . PONV (postoperative nausea and vomiting)    said usually has to have scop patch  . Rash and nonspecific skin eruption 10/16/2015  . Spinal stenosis     PAST SURGICAL HISTORY: Past Surgical History:  Procedure Laterality Date  . ABDOMINAL HYSTERECTOMY  1990  . BREAST BIOPSY Left 12/07/2015  . BREAST LUMPECTOMY Left 01/01/2016  . BREAST LUMPECTOMY WITH RADIOACTIVE SEED AND SENTINEL LYMPH NODE BIOPSY Left 01/01/2016   Procedure: LEFT BREAST LUMPECTOMY WITH RADIOACTIVE SEED AND SENTINEL LYMPH NODE BIOPSY;  Surgeon: Alphonsa Overall, MD;  Location: [facility];  Service: General;  Laterality: Left;  . CARPAL TUNNEL RELEASE  both hands  . CERVICAL FUSION  1989  . PORTACATH PLACEMENT Right 01/01/2016   Procedure: INSERTION PORT-A-CATH WITH [name];  Surgeon: Alphonsa Overall, MD;  Location: [facility];  Service: General;   Laterality: Right;  . TONSILLECTOMY      FAMILY HISTORY Family History  Problem Relation Age of Onset  . High blood pressure Mother   . High Cholesterol Mother   . Cancer Father   . High blood pressure Father   . High Cholesterol Father   . Stomach cancer Father        intestinal  . Lung cancer Maternal Grandfather   . Breast cancer Maternal Aunt 49  The patient's father died from heart disease at the age of 46. The patient's mother died from "genetic cirrhosis" at the age of 57. The patient had 2 brothers, 5 sisters. A maternal aunt was diagnosed with breast cancer at age 19. There is no other history of breast or ovarian cancer in the family to the patient's knowledge    GYNECOLOGIC HISTORY:  No LMP recorded. Patient has had a hysterectomy. Menarche age 69, first live birth age 82. Patient is GX P1. She underwent hysterectomy with bilateral salpingo-oophorectomy at age 69. She took hormone replacement for approximately 10 years, until age 66. She also had oral contraceptives remotely for 8-10 years, with no complications.    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 She is currently not working. She now lives by herself. Her husband, [name] who retired from computer work, passed away in 06-12-2016. Their son [name] works as a Chief Strategy Officer for rest home in [place].  He currently lives with the patient and works from home.  The patient has 3 grandchildren who live in [place]. She is not a Ambulance person.    ADVANCED DIRECTIVES: The patient has a living will in place   HEALTH MAINTENANCE: Social History   Tobacco Use  . Smoking status: Never Smoker  . Smokeless tobacco: Never Used  Substance Use Topics  . Alcohol use: Yes    Alcohol/week: 1.0 standard drinks    Types: 1 Glasses of wine per week    Comment: on weekends  . Drug use: No     Colonoscopy: 2012/be routinely  PAP: Status post hysterectomy  Bone  density: Never   No Known Allergies  Current Outpatient Medications  Medication Sig Dispense Refill  . acetaminophen (TYLENOL) 500 MG tablet Take 500 mg by mouth every 6 (six) hours as needed.    Marland Kitchen amLODipine (NORVASC) 10 MG tablet Take 1 tablet (10 mg total) by mouth daily. 90 tablet 1  . aspirin EC 81 MG tablet Take 81 mg by mouth daily.    . benazepril (LOTENSIN) 40 MG tablet TAKE 1 TABLET (40 MG TOTAL) BY MOUTH DAILY. 90 tablet 1  . bictegravir-emtricitabine-tenofovir AF (BIKTARVY) 50-200-25 MG TABS tablet Take 1 tablet by mouth daily. 90 tablet 1  . Biotin 5000 MCG CAPS Take 5,000 mcg daily by mouth.    . Coenzyme Q10 300 MG CAPS Take 300 capsules by mouth daily.    . cyclobenzaprine (FLEXERIL) 10 MG tablet Take 1 tablet (10 mg total) by mouth at bedtime as needed. 90 tablet 1  . fexofenadine (ALLEGRA) 180 MG tablet Take 180 mg daily by mouth.    . gabapentin (NEURONTIN) 100 MG capsule TAKE 1 CAPSULE (100 MG TOTAL) BY MOUTH 2 (TWO) TIMES DAILY. 180 capsule 1  . metoprolol tartrate (  LOPRESSOR) 25 MG tablet TAKE 1/2 TABLET BY MOUTH 2 TIMES DAILY. 90 tablet 1  . Multiple Vitamin (MULTIVITAMIN WITH MINERALS) TABS tablet Take 1 tablet by mouth daily.    . Omega-3 Fatty Acids (FISH OIL) 1200 MG CAPS Take 1,200 mg by mouth 2 (two) times daily.      . polyethylene glycol (MIRALAX / GLYCOLAX) packet Take 17 g by mouth daily.    . rosuvastatin (CRESTOR) 5 MG tablet TAKE 1 TABLET (5 MG TOTAL) BY MOUTH AT BEDTIME. 90 tablet 1   No current facility-administered medications for this visit.   Facility-Administered Medications Ordered in Other Visits  Medication Dose Route Frequency Provider Last Rate Last Admin  . sodium chloride flush (NS) 0.9 % injection 10 mL  10 mL Intravenous PRN Viera Okonski, Virgie Dad, MD   10 mL at 05/21/16 1022    OBJECTIVE: Middle-aged white woman who appears well  Vitals:   02/01/19 0900  BP: (!) 153/80  Pulse: 95  Resp: 20  Temp: 97.8 F (36.6 C)  SpO2: 100%      Body mass index is 27.16 kg/m.    ECOG FS:0 - Asymptomatic   Sclerae unicteric, EOMs intact Wearing a mask No cervical or supraclavicular adenopathy Lungs no rales or rhonchi Heart regular rate and rhythm Abd soft, nontender, positive bowel sounds MSK no focal spinal tenderness, no upper extremity lymphedema Neuro: nonfocal, well oriented, appropriate affect Breasts: The right breast is unremarkable.  The left breast has undergone lumpectomy and radiation.  The areas of concern to her are minimally erythematous and minimally thickened.  I think she is beginning to develop telangiectasias secondary to the radiation.  Both axillae are benign.  LAB RESULTS:  CMP     Component Value Date/Time   NA 141 01/29/2018 0900   NA 140 12/31/2016 1059   K 3.5 01/29/2018 0900   K 3.9 12/31/2016 1059   CL 104 01/29/2018 0900   CO2 27 01/29/2018 0900   CO2 28 12/31/2016 1059   GLUCOSE 95 01/29/2018 0900   GLUCOSE 75 12/31/2016 1059   BUN 13 01/29/2018 0900   BUN 11.3 12/31/2016 1059   CREATININE 0.94 01/29/2018 0900   CREATININE 0.85 01/06/2018 0918   CREATININE 0.9 12/31/2016 1059   CALCIUM 9.8 01/29/2018 0900   CALCIUM 9.6 12/31/2016 1059   PROT 7.9 01/29/2018 0900   PROT 7.3 12/31/2016 1059   ALBUMIN 4.5 01/29/2018 0900   ALBUMIN 4.2 12/31/2016 1059   AST 33 01/29/2018 0900   AST 31 12/31/2016 1059   ALT 32 01/29/2018 0900   ALT 28 12/31/2016 1059   ALKPHOS 94 01/29/2018 0900   ALKPHOS 80 12/31/2016 1059   BILITOT 0.5 01/29/2018 0900   BILITOT 0.40 12/31/2016 1059   GFRNONAA >60 01/29/2018 0900   GFRNONAA 71 01/06/2018 0918   GFRAA >60 01/29/2018 0900   GFRAA 83 01/06/2018 0918    INo results found for: SPEP, UPEP  Lab Results  Component Value Date   WBC 7.4 02/01/2019   NEUTROABS 4.3 02/01/2019   HGB 15.1 (H) 02/01/2019   HCT 44.0 02/01/2019   MCV 95.2 02/01/2019   PLT 255 02/01/2019      Chemistry      Component Value Date/Time   NA 141 01/29/2018 0900   NA 140  12/31/2016 1059   K 3.5 01/29/2018 0900   K 3.9 12/31/2016 1059   CL 104 01/29/2018 0900   CO2 27 01/29/2018 0900   CO2 28 12/31/2016 1059   BUN  13 01/29/2018 0900   BUN 11.3 12/31/2016 1059   CREATININE 0.94 01/29/2018 0900   CREATININE 0.85 01/06/2018 0918   CREATININE 0.9 12/31/2016 1059      Component Value Date/Time   CALCIUM 9.8 01/29/2018 0900   CALCIUM 9.6 12/31/2016 1059   ALKPHOS 94 01/29/2018 0900   ALKPHOS 80 12/31/2016 1059   AST 33 01/29/2018 0900   AST 31 12/31/2016 1059   ALT 32 01/29/2018 0900   ALT 28 12/31/2016 1059   BILITOT 0.5 01/29/2018 0900   BILITOT 0.40 12/31/2016 1059       No results found for: LABCA2  No components found for: LABCA125  No results for input(s): INR in the last 168 hours.  Urinalysis    Component Value Date/Time   COLORURINE YELLOW 06/26/2017 [place] 06/26/2017 1205   LABSPEC 1.008 06/26/2017 1205   PHURINE 5.5 06/26/2017 1205   GLUCOSEU NEGATIVE 06/26/2017 1205   [place] 06/26/2017 1205   [place] 03/21/2007 1108   [place] 06/26/2017 [place] 06/26/2017 1205   UROBILINOGEN 1.0 03/21/2007 1108   NITRITE NEGATIVE 06/26/2017 1205   LEUKOCYTESUR TRACE (A) 06/26/2017 1205     STUDIES: No results found.    ELIGIBLE FOR AVAILABLE RESEARCH PROTOCOL: no  ASSESSMENT: 69 y.o. [place] woman status post left breast upper outer quadrant biopsy 12/07/2015 for a clinical T2 N0, stage IIA invasive ductal carcinoma, grade 3, triple negative, with an MIB-1 of 35%  (1) genetics testing 01/23/2016  (a) alpha -1- anti trypsin and ceruloplamin labs drawn 01/19/2016, both normal  (2) Status post left lumpectomy/ sentinel lymph node sampling 01/01/2016 for a pT1c pN0, stage IA  Invasive ductal carcinoma, grade 3, with negative margins.  (3) Mammaprint sent from the patient's 12/07/2015 biopsy returned "high risk", predicting a risk of recurrence of nearly 30%  untreated, decreasing to a 93% chance of five-year distant metastasis free survival with chemotherapy.  (4) adjuvant chemotherapy consisting of cyclophosphamide and doxorubicin in dose dense fashion 4 started 02/01/2016, completed 03/12/2016, followed by weekly Abraxane 12 starting 04/02/2016,  (a) changed on 04/30/16 to Abraxane days 1,8,15, with Neulasta support on day 16 of each 28 day cycle,   (b) then changed on 4/17 to Abraxane days 1, 8, on a 21 day cycle with Neulasta on day 9 due to neutropenia  (c) taxanes stopped after 9 doses due to neutropenia, last dose 06/18/2016  (5) adjuvant radiation 07/29/2016-09/12/2016 Site/dose:   1.) Breast, Left 1.8 Gy in 28 fractions                         2.) Breast, Left Boost 2 Gy in 5 fractions   (6) HIV positivity: Followed by Dr. Baxter Flattery.   (a) CD4 percentage 25%, absolute #[phone] on 08/04/2018  PLAN: [place] is now 3 years out from definitive surgery for her breast cancer with no evidence of disease recurrence.  This is favorable.  I discussed delayed radiation changes with her and showed her some pictures of telangiectasias which I think she may eventually develop.  I do not see evidence of sarcoma or local recurrence of her cancer.  She could use of steroid cream if she wishes such as Cortaid on some of the areas that are a little thicker but I do not think it will make much difference.  She was reassured by the discussion.  We discussed the fact that her breasts are not dense which is favorable.  She will have her next mammogram early December of this year.  I will see her shortly after that  I commended her exercise program and her Covid precautions  She knows to call for any other issue that may develop before the next visit.    Virgie Dad. Matilyn Fehrman, MD 02/01/19 9:19 AM Medical Oncology and Hematology [facility] [place], [place] [address] Tel. [phone]    Fax. [phone]    I, Wilburn Mylar, am  acting as scribe for Dr. Virgie Dad. Delando Satter.  I, Lurline Del MD, have reviewed the above documentation for accuracy and completeness, and I agree with the above.

## 2019-02-01 ENCOUNTER — Inpatient Hospital Stay (HOSPITAL_BASED_OUTPATIENT_CLINIC_OR_DEPARTMENT_OTHER): Payer: Medicare Other | Admitting: Oncology

## 2019-02-01 ENCOUNTER — Telehealth: Payer: Self-pay | Admitting: Oncology

## 2019-02-01 ENCOUNTER — Inpatient Hospital Stay: Payer: Medicare Other | Attending: Oncology

## 2019-02-01 ENCOUNTER — Other Ambulatory Visit: Payer: Medicare Other

## 2019-02-01 ENCOUNTER — Other Ambulatory Visit: Payer: Self-pay | Admitting: *Deleted

## 2019-02-01 ENCOUNTER — Other Ambulatory Visit: Payer: Self-pay

## 2019-02-01 VITALS — BP 153/80 | HR 95 | Temp 97.8°F | Resp 20 | Ht 65.0 in | Wt 163.2 lb

## 2019-02-01 DIAGNOSIS — Z171 Estrogen receptor negative status [ER-]: Secondary | ICD-10-CM | POA: Diagnosis not present

## 2019-02-01 DIAGNOSIS — B20 Human immunodeficiency virus [HIV] disease: Secondary | ICD-10-CM

## 2019-02-01 DIAGNOSIS — C50412 Malignant neoplasm of upper-outer quadrant of left female breast: Secondary | ICD-10-CM

## 2019-02-01 DIAGNOSIS — Z853 Personal history of malignant neoplasm of breast: Secondary | ICD-10-CM | POA: Insufficient documentation

## 2019-02-01 DIAGNOSIS — Z21 Asymptomatic human immunodeficiency virus [HIV] infection status: Secondary | ICD-10-CM | POA: Insufficient documentation

## 2019-02-01 LAB — CBC WITH DIFFERENTIAL (CANCER CENTER ONLY)
Abs Immature Granulocytes: 0.01 10*3/uL (ref 0.00–0.07)
Basophils Absolute: 0.1 10*3/uL (ref 0.0–0.1)
Basophils Relative: 1 %
Eosinophils Absolute: 0.2 10*3/uL (ref 0.0–0.5)
Eosinophils Relative: 2 %
HCT: 44 % (ref 36.0–46.0)
Hemoglobin: 15.1 g/dL — ABNORMAL HIGH (ref 12.0–15.0)
Immature Granulocytes: 0 %
Lymphocytes Relative: 30 %
Lymphs Abs: 2.2 10*3/uL (ref 0.7–4.0)
MCH: 32.7 pg (ref 26.0–34.0)
MCHC: 34.3 g/dL (ref 30.0–36.0)
MCV: 95.2 fL (ref 80.0–100.0)
Monocytes Absolute: 0.7 10*3/uL (ref 0.1–1.0)
Monocytes Relative: 9 %
Neutro Abs: 4.3 10*3/uL (ref 1.7–7.7)
Neutrophils Relative %: 58 %
Platelet Count: 255 10*3/uL (ref 150–400)
RBC: 4.62 MIL/uL (ref 3.87–5.11)
RDW: 12.5 % (ref 11.5–15.5)
WBC Count: 7.4 10*3/uL (ref 4.0–10.5)
nRBC: 0 % (ref 0.0–0.2)

## 2019-02-01 LAB — CMP (CANCER CENTER ONLY)
ALT: 28 U/L (ref 0–44)
AST: 31 U/L (ref 15–41)
Albumin: 4.4 g/dL (ref 3.5–5.0)
Alkaline Phosphatase: 75 U/L (ref 38–126)
Anion gap: 11 (ref 5–15)
BUN: 12 mg/dL (ref 8–23)
CO2: 28 mmol/L (ref 22–32)
Calcium: 9.9 mg/dL (ref 8.9–10.3)
Chloride: 104 mmol/L (ref 98–111)
Creatinine: 0.85 mg/dL (ref 0.44–1.00)
GFR, Est AFR Am: >60 mL/min (ref >60)
GFR, Estimated: >60 mL/min (ref >60)
Glucose, Bld: 87 mg/dL (ref 70–99)
Potassium: 4.3 mmol/L (ref 3.5–5.1)
Sodium: 143 mmol/L (ref 135–145)
Total Bilirubin: 0.4 mg/dL (ref 0.3–1.2)
Total Protein: 7.6 g/dL (ref 6.5–8.1)

## 2019-02-01 NOTE — Telephone Encounter (Signed)
I left a message regarding schedule  

## 2019-02-02 ENCOUNTER — Telehealth: Payer: Self-pay | Admitting: Oncology

## 2019-02-02 LAB — T-HELPER CELL (CD4) - (RCID CLINIC ONLY)
CD4 % Helper T Cell: 26 % — ABNORMAL LOW (ref 33–65)
CD4 T Cell Abs: 854 /uL (ref 400–1790)

## 2019-02-02 NOTE — Telephone Encounter (Signed)
Patient called back to reschedule due to insurance

## 2019-02-05 LAB — HIV-1 RNA QUANT-NO REFLEX-BLD
HIV 1 RNA Quant: <20 copies/mL
HIV-1 RNA Quant, Log: <1.3 Log copies/mL

## 2019-02-08 ENCOUNTER — Other Ambulatory Visit: Payer: Self-pay | Admitting: Internal Medicine

## 2019-02-08 DIAGNOSIS — B20 Human immunodeficiency virus [HIV] disease: Secondary | ICD-10-CM

## 2019-02-08 DIAGNOSIS — I10 Essential (primary) hypertension: Secondary | ICD-10-CM

## 2019-02-08 DIAGNOSIS — M62838 Other muscle spasm: Secondary | ICD-10-CM

## 2019-02-08 MED FILL — AMLODIPINE BESYLATE 10 MG T: 10 | 90 days supply | Qty: 90 | Fill #0

## 2019-02-08 MED FILL — METOPROLOL TARTRATE 25 MG T: 25 | 90 days supply | Qty: 90 | Fill #0

## 2019-02-08 MED FILL — BIKTARVY 50-200-25 MG TABS: 50-200-25 | 30 days supply | Qty: 30 | Fill #0

## 2019-02-09 MED FILL — CYCLOBENZAPRINE HCL 10 MG T: 10 | 90 days supply | Qty: 90 | Fill #0

## 2019-02-15 ENCOUNTER — Other Ambulatory Visit: Payer: Self-pay

## 2019-02-15 ENCOUNTER — Ambulatory Visit (INDEPENDENT_AMBULATORY_CARE_PROVIDER_SITE_OTHER): Payer: Medicare Other | Admitting: Internal Medicine

## 2019-02-15 DIAGNOSIS — B20 Human immunodeficiency virus [HIV] disease: Secondary | ICD-10-CM | POA: Diagnosis not present

## 2019-02-15 DIAGNOSIS — T451X5A Adverse effect of antineoplastic and immunosuppressive drugs, initial encounter: Secondary | ICD-10-CM | POA: Diagnosis not present

## 2019-02-15 DIAGNOSIS — Z79899 Other long term (current) drug therapy: Secondary | ICD-10-CM | POA: Diagnosis not present

## 2019-02-15 DIAGNOSIS — G620 Drug-induced polyneuropathy: Secondary | ICD-10-CM

## 2019-02-15 NOTE — Patient Instructions (Signed)
COVID-19 Vaccine Information can be found at: [email or web address] For questions related to vaccine distribution or appointments, please email [email or web address] or call [phone].    

## 2019-02-15 NOTE — Progress Notes (Signed)
RFV:  HIV disease- televisit   Patient ID: [name], female   DOB: 1950-09-11, 68 y.o.   MRN: [id number]  HPI 69 yo F for HIV disease, cd4 count 854/ VL<20, on biktarvy. No side effects. No recent illnesses. Still taking gabapentin for leg cramps.  Breast ca still in remission. Last mammo in dec is clear.  Son staying with her and staying secluded  Outpatient Encounter Medications as of 02/15/2019  Medication Sig  . amLODipine (NORVASC) 10 MG tablet TAKE 1 TABLET (10 MG TOTAL) BY MOUTH DAILY.  Marland Kitchen aspirin EC 81 MG tablet Take 81 mg by mouth daily.  . benazepril (LOTENSIN) 40 MG tablet TAKE 1 TABLET (40 MG TOTAL) BY MOUTH DAILY.  Marland Kitchen BIKTARVY 50-200-25 MG TABS tablet TAKE 1 TABLET BY MOUTH DAILY.  Marland Kitchen Biotin 5000 MCG CAPS Take 5,000 mcg daily by mouth.  . Coenzyme Q10 300 MG CAPS Take 300 capsules by mouth daily.  . cyclobenzaprine (FLEXERIL) 10 MG tablet TAKE 1 TABLET (10 MG TOTAL) BY MOUTH AT BEDTIME AS NEEDED.  . fexofenadine (ALLEGRA) 180 MG tablet Take 180 mg daily by mouth.  . gabapentin (NEURONTIN) 100 MG capsule TAKE 1 CAPSULE (100 MG TOTAL) BY MOUTH 2 (TWO) TIMES DAILY.  . metoprolol tartrate (LOPRESSOR) 25 MG tablet TAKE 1/2 TABLET BY MOUTH 2 TIMES DAILY.  . Multiple Vitamin (MULTIVITAMIN WITH MINERALS) TABS tablet Take 1 tablet by mouth daily.  . Omega-3 Fatty Acids (FISH OIL) 1200 MG CAPS Take 1,200 mg by mouth 2 (two) times daily.    . polyethylene glycol (MIRALAX / GLYCOLAX) packet Take 17 g by mouth daily.  . rosuvastatin (CRESTOR) 5 MG tablet TAKE 1 TABLET (5 MG TOTAL) BY MOUTH AT BEDTIME.  Marland Kitchen acetaminophen (TYLENOL) 500 MG tablet Take 500 mg by mouth every 6 (six) hours as needed.   Facility-Administered Encounter Medications as of 02/15/2019  Medication  . sodium chloride flush (NS) 0.9 % injection 10 mL     Patient Active Problem List   Diagnosis Date Noted  . Aortic atherosclerosis ([place]) 10/07/2017  . Oral thrush 06/26/2017  . Dry mouth & dry eyes   06/26/2017  . Muscle spasms of neck 11/25/2016  . Drug-induced neutropenia ([place]) 04/23/2016  . Malignant neoplasm of upper-outer quadrant of left breast in female, estrogen receptor negative ([place]) 12/11/2015  . Hereditary and idiopathic peripheral neuropathy 07/16/2012  . Lumbar neuritis 07/16/2012  . Cervicalgia 07/16/2012  . URI 12/02/2006  . Human immunodeficiency virus (HIV) disease ([place]) 11/08/2005  . HYPERLIPIDEMIA 11/08/2005  . Essential hypertension 11/08/2005  . BRONCHITIS 11/08/2005  . Regional enteritis ([place]) 11/08/2005  . SYMPTOM, PAINFUL RESPIRATION 11/08/2005     Health Maintenance Due  Topic Date Due  . COLONOSCOPY  11/20/2001  . DEXA SCAN  11/20/2016  . PNA vac Low Risk Adult (2 of 2 - PPSV23) 05/28/2018     Review of Systems  Review of Systems  Constitutional: Negative for fever, chills, diaphoresis, activity change, appetite change, fatigue and unexpected weight change.  HENT: Negative for congestion, sore throat, rhinorrhea, sneezing, trouble swallowing and sinus pressure.  Eyes: Negative for photophobia and visual disturbance.  Respiratory: Negative for cough, chest tightness, shortness of breath, wheezing and stridor.  Cardiovascular: Negative for chest pain, palpitations and leg swelling.  Gastrointestinal: Negative for nausea, vomiting, abdominal pain, diarrhea, constipation, blood in stool, abdominal distention and anal bleeding.  Genitourinary: Negative for dysuria, hematuria, flank pain and difficulty urinating.  Musculoskeletal: Negative for myalgias, back pain, joint swelling, arthralgias  and gait problem.  Skin: Negative for color change, pallor, rash and wound.  Neurological: + leg numbness Hematological: Negative for adenopathy. Does not bruise/bleed easily.  Psychiatric/Behavioral: Negative for behavioral problems, confusion, sleep disturbance, dysphoric mood, decreased concentration and agitation.     Physical Exam   No physical exam Lab  Results  Component Value Date   CD4TCELL 26 (L) 02/01/2019   Lab Results  Component Value Date   CD4TABS 854 02/01/2019   CD4TABS 619 08/04/2018   CD4TABS 490 01/06/2018   Lab Results  Component Value Date   HIV1RNAQUANT <20 NOT DETECTED 02/01/2019   Lab Results  Component Value Date   HEPBSAB No 03/24/2006   Lab Results  Component Value Date   LABRPR NON-REACTIVE 02/05/2017    CBC Lab Results  Component Value Date   WBC 7.4 02/01/2019   RBC 4.62 02/01/2019   HGB 15.1 (H) 02/01/2019   HCT 44.0 02/01/2019   PLT 255 02/01/2019   MCV 95.2 02/01/2019   MCH 32.7 02/01/2019   MCHC 34.3 02/01/2019   RDW 12.5 02/01/2019   LYMPHSABS 2.2 02/01/2019   MONOABS 0.7 02/01/2019   EOSABS 0.2 02/01/2019    BMET Lab Results  Component Value Date   NA 143 02/01/2019   K 4.3 02/01/2019   CL 104 02/01/2019   CO2 28 02/01/2019   GLUCOSE 87 02/01/2019   BUN 12 02/01/2019   CREATININE 0.85 02/01/2019   CALCIUM 9.9 02/01/2019   GFRNONAA >60 02/01/2019   GFRAA >60 02/01/2019    Assessment and Plan  HIV disease = continue on biktarvy. To take daily. Well controlled  Long term medication management = cr is stable. No need to change ART regimen  Health maintenance = recommend to get covid vaccine. Patient is hesistant and wants to wait 1-2 months  LE neuropathy= continue with gabapentin  rtc 6 months

## 2019-03-08 MED FILL — BIKTARVY 50-200-25 MG TABS: 50-200-25 | 30 days supply | Qty: 30 | Fill #1

## 2019-03-25 IMAGING — DX DG CHEST 2V
2 series · 2 of 2 positions shown · non-contrast
Comparison: 01/01/2016

CLINICAL DATA: Shortness breath with exertion, chills x1 day

EXAM:
CHEST  2 VIEW

[chest pa]
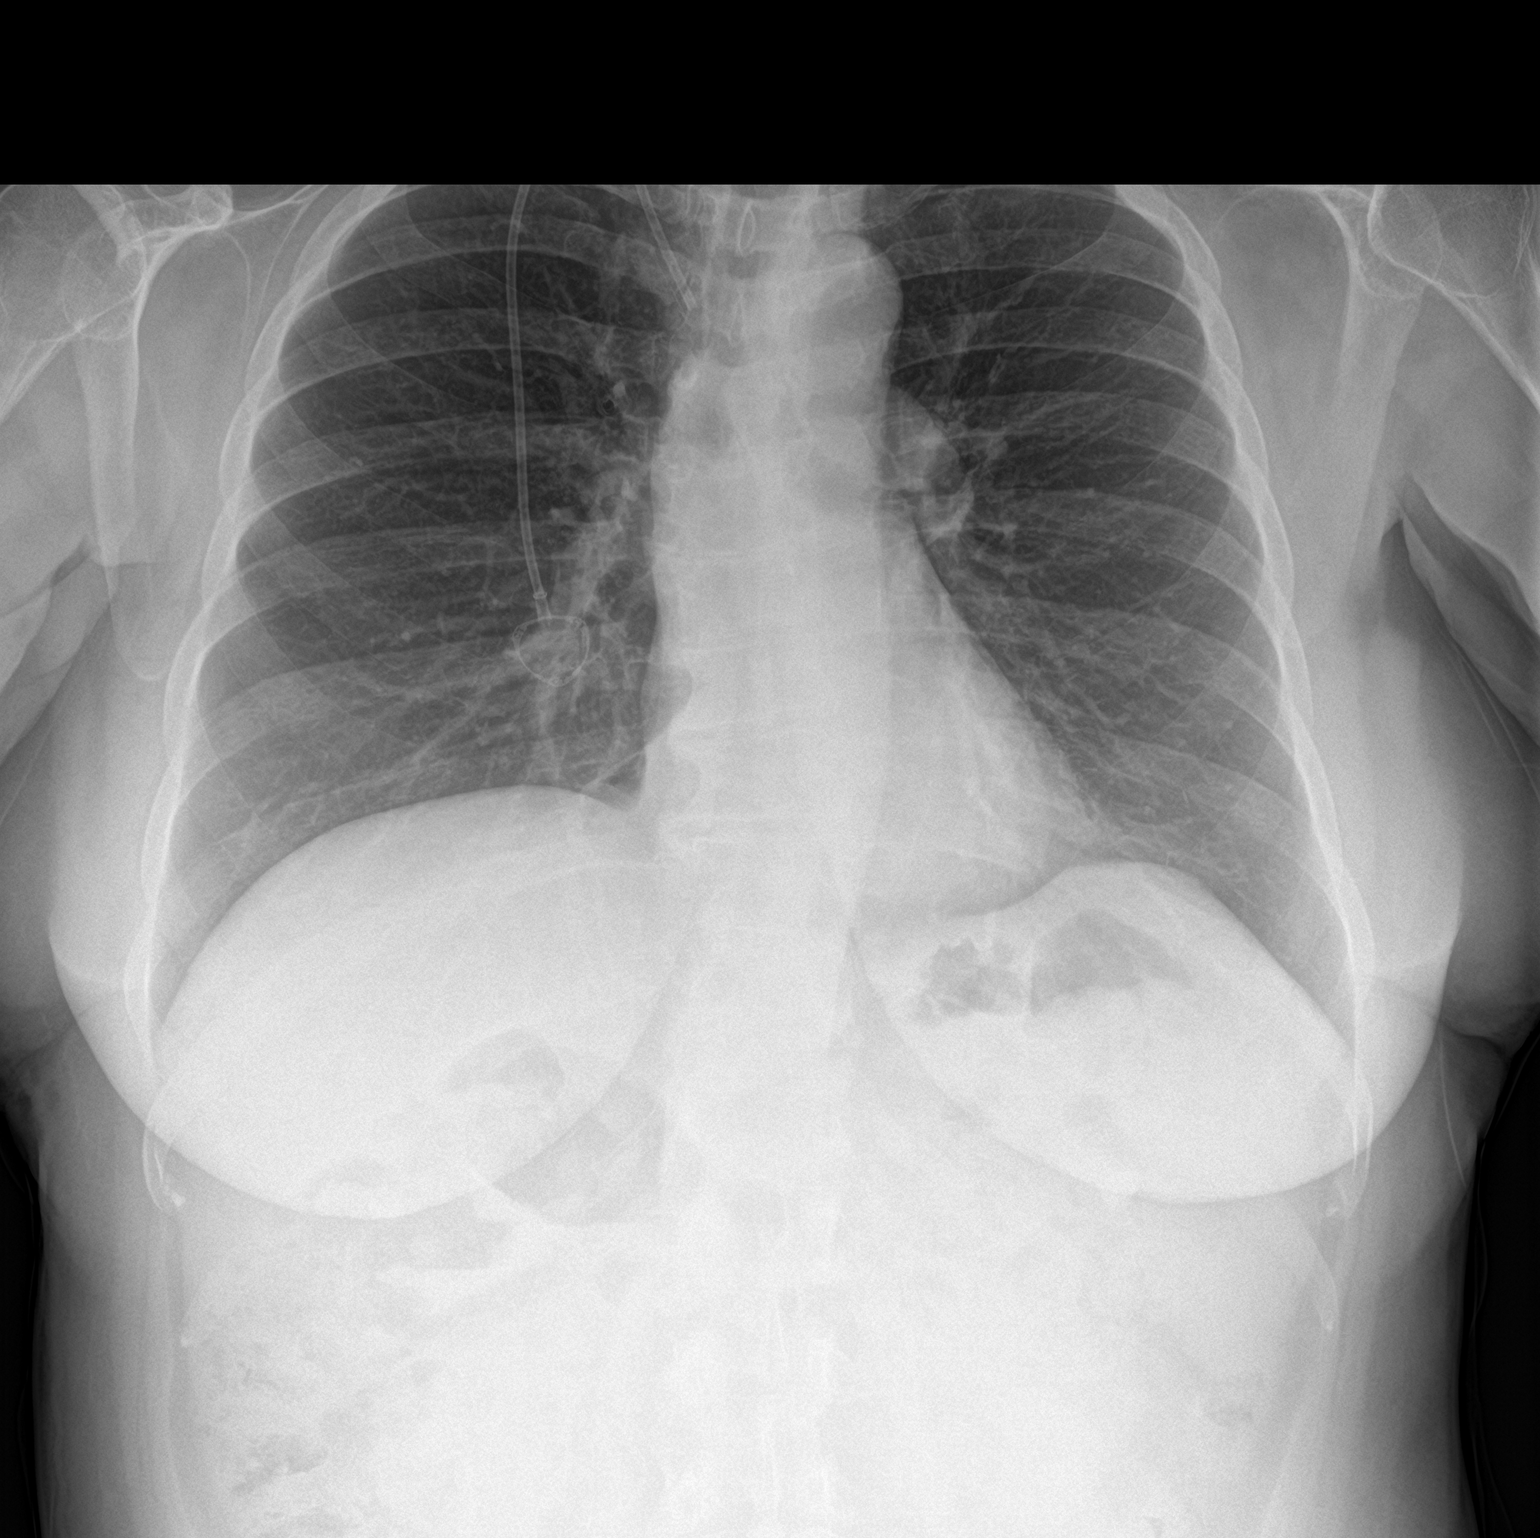

[chest lat]
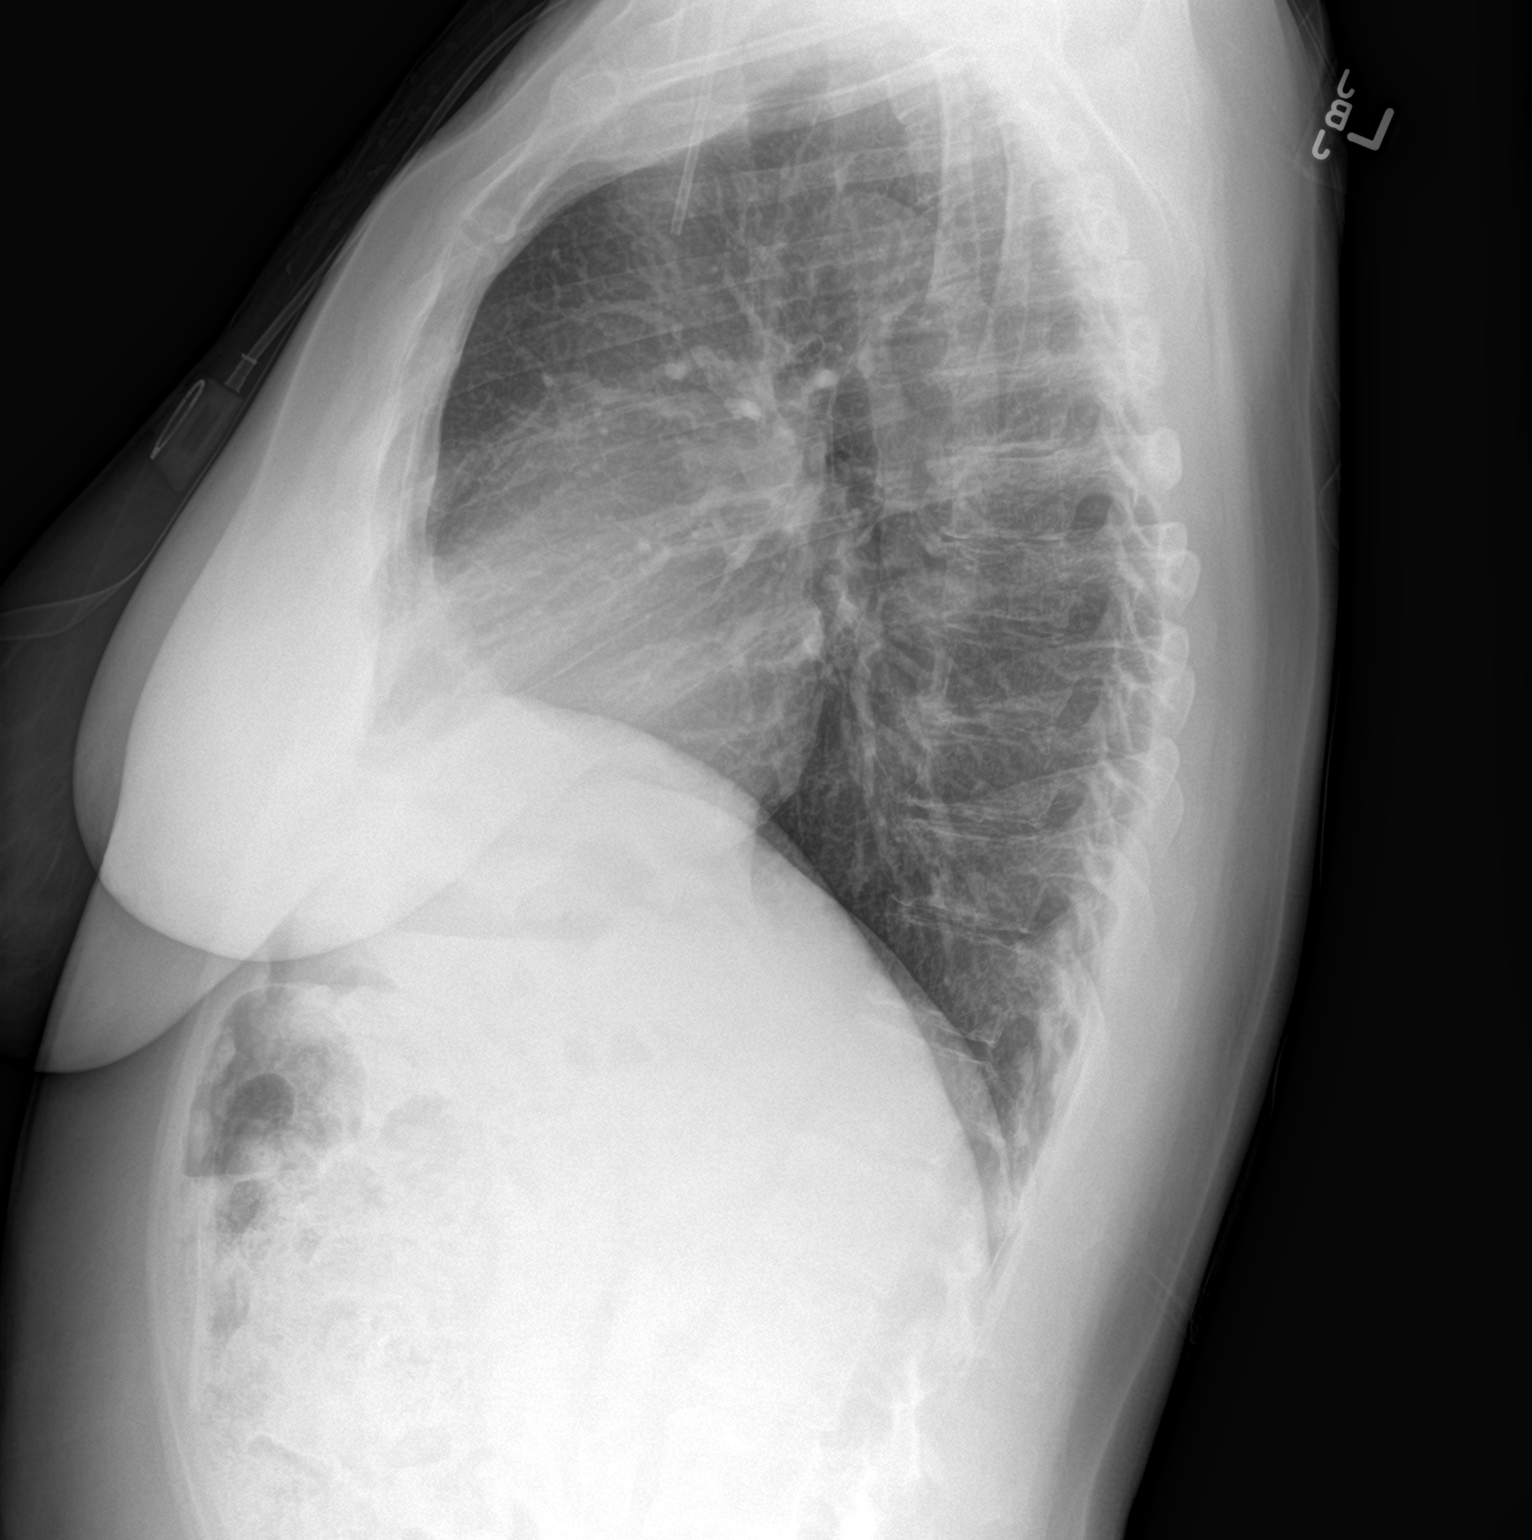

[2 of 2 positions shown; findings below may reference images not displayed]

FINDINGS: Lungs are clear.  No pleural effusion or pneumothorax.

The heart is normal in size.

Right chest port terminating in the upper SVC.

Mild degenerative changes of the visualized thoracolumbar spine.
IMPRESSION: No evidence of acute cardiopulmonary disease.

Right chest port partially withdrawn, now terminating in the upper
SVC.

## 2019-04-01 ENCOUNTER — Other Ambulatory Visit: Payer: Self-pay | Admitting: Internal Medicine

## 2019-04-01 DIAGNOSIS — G620 Drug-induced polyneuropathy: Secondary | ICD-10-CM

## 2019-04-01 DIAGNOSIS — E7800 Pure hypercholesterolemia, unspecified: Secondary | ICD-10-CM

## 2019-04-01 DIAGNOSIS — I10 Essential (primary) hypertension: Secondary | ICD-10-CM

## 2019-04-01 DIAGNOSIS — T451X5A Adverse effect of antineoplastic and immunosuppressive drugs, initial encounter: Secondary | ICD-10-CM

## 2019-04-01 DIAGNOSIS — B20 Human immunodeficiency virus [HIV] disease: Secondary | ICD-10-CM

## 2019-04-01 MED FILL — GABAPENTIN 100 MG CAPSULE: 100 | 90 days supply | Qty: 180 | Fill #0

## 2019-04-01 MED FILL — ROSUVASTATIN CALCIUM 5 MG T: 5 | 90 days supply | Qty: 90 | Fill #0

## 2019-04-01 MED FILL — BENAZEPRIL HCL 40 MG TABLET: 40 | 90 days supply | Qty: 90 | Fill #0

## 2019-04-05 MED FILL — BIKTARVY 50-200-25 MG TABS: 50-200-25 | 30 days supply | Qty: 30 | Fill #0

## 2019-05-03 MED FILL — METOPROLOL TARTRATE 25 MG T: 25 | 90 days supply | Qty: 90 | Fill #1

## 2019-05-03 MED FILL — AMLODIPINE BESYLATE 10 MG T: 10 | 90 days supply | Qty: 90 | Fill #1

## 2019-05-03 MED FILL — BIKTARVY 50-200-25 MG TABS: 50-200-25 | 30 days supply | Qty: 30 | Fill #1

## 2019-05-03 MED FILL — CYCLOBENZAPRINE HCL 10 MG T: 10 | 90 days supply | Qty: 90 | Fill #1

## 2019-05-31 MED FILL — BIKTARVY 50-200-25 MG TABS: 50-200-25 | 30 days supply | Qty: 30 | Fill #2

## 2019-06-29 MED FILL — GABAPENTIN 100 MG CAPSULE: 100 | 90 days supply | Qty: 180 | Fill #1

## 2019-06-29 MED FILL — BENAZEPRIL HCL 40 MG TABLET: 40 | 90 days supply | Qty: 90 | Fill #1

## 2019-06-29 MED FILL — ROSUVASTATIN CALCIUM 5 MG T: 5 | 90 days supply | Qty: 90 | Fill #1

## 2019-06-29 MED FILL — BIKTARVY 50-200-25 MG TABS: 50-200-25 | 30 days supply | Qty: 30 | Fill #3

## 2019-07-22 ENCOUNTER — Other Ambulatory Visit: Payer: Self-pay | Admitting: Internal Medicine

## 2019-07-22 DIAGNOSIS — I10 Essential (primary) hypertension: Secondary | ICD-10-CM

## 2019-07-22 DIAGNOSIS — M62838 Other muscle spasm: Secondary | ICD-10-CM

## 2019-07-22 MED FILL — METOPROLOL TARTRATE 25 MG T: 25 | 90 days supply | Qty: 90 | Fill #0

## 2019-07-22 MED FILL — CYCLOBENZAPRINE HCL 10 MG T: 10 | 90 days supply | Qty: 90 | Fill #0

## 2019-07-22 MED FILL — AMLODIPINE BESYLATE 10 MG T: 10 | 90 days supply | Qty: 90 | Fill #0

## 2019-07-26 MED FILL — BIKTARVY 50-200-25 MG TABS: 50-200-25 | 30 days supply | Qty: 30 | Fill #4

## 2019-08-19 MED FILL — BIKTARVY 50-200-25 MG TABS: 50-200-25 | 30 days supply | Qty: 30 | Fill #5

## 2019-09-06 ENCOUNTER — Other Ambulatory Visit: Payer: Self-pay | Admitting: *Deleted

## 2019-09-06 ENCOUNTER — Other Ambulatory Visit: Payer: Self-pay

## 2019-09-06 ENCOUNTER — Other Ambulatory Visit (HOSPITAL_COMMUNITY)
Admission: RE | Admit: 2019-09-06 | Discharge: 2019-09-06 | Disposition: A | Discharge status: Home / Self Care | Payer: Medicare Other | Source: Ambulatory Visit | Attending: Internal Medicine | Admitting: Internal Medicine

## 2019-09-06 ENCOUNTER — Other Ambulatory Visit: Payer: Medicare Other

## 2019-09-06 DIAGNOSIS — Z113 Encounter for screening for infections with a predominantly sexual mode of transmission: Secondary | ICD-10-CM | POA: Insufficient documentation

## 2019-09-06 DIAGNOSIS — Z79899 Other long term (current) drug therapy: Secondary | ICD-10-CM | POA: Diagnosis not present

## 2019-09-06 DIAGNOSIS — B20 Human immunodeficiency virus [HIV] disease: Secondary | ICD-10-CM | POA: Diagnosis not present

## 2019-09-07 LAB — URINE CYTOLOGY ANCILLARY ONLY
Chlamydia: NEGATIVE
Comment: NEGATIVE
Comment: NORMAL
Neisseria Gonorrhea: NEGATIVE

## 2019-09-07 LAB — T-HELPER CELL (CD4) - (RCID CLINIC ONLY)
CD4 % Helper T Cell: 27 % — ABNORMAL LOW (ref 33–65)
CD4 T Cell Abs: 654 /uL (ref 400–1790)

## 2019-09-09 LAB — COMPLETE METABOLIC PANEL WITH GFR
AG Ratio: 1.9 (calc) (ref 1.0–2.5)
ALT: 27 U/L (ref 6–29)
AST: 31 U/L (ref 10–35)
Albumin: 5 g/dL (ref 3.6–5.1)
Alkaline phosphatase (APISO): 81 U/L (ref 37–153)
BUN: 12 mg/dL (ref 7–25)
CO2: 30 mmol/L (ref 20–32)
Calcium: 10.2 mg/dL (ref 8.6–10.4)
Chloride: 103 mmol/L (ref 98–110)
Creat: 0.89 mg/dL (ref 0.50–0.99)
GFR, Est African American: 78 mL/min/{1.73_m2} (ref >=60)
GFR, Est Non African American: 67 mL/min/{1.73_m2} (ref >=60)
Globulin: 2.6 g/dL (calc) (ref 1.9–3.7)
Glucose, Bld: 91 mg/dL (ref 65–99)
Potassium: 4.5 mmol/L (ref 3.5–5.3)
Sodium: 141 mmol/L (ref 135–146)
Total Bilirubin: 0.6 mg/dL (ref 0.2–1.2)
Total Protein: 7.6 g/dL (ref 6.1–8.1)

## 2019-09-09 LAB — CBC WITH DIFFERENTIAL/PLATELET
Absolute Monocytes: 564 cells/uL (ref 200–950)
Basophils Absolute: 61 cells/uL (ref 0–200)
Basophils Relative: 0.9 %
Eosinophils Absolute: 143 cells/uL (ref 15–500)
Eosinophils Relative: 2.1 %
HCT: 44.1 % (ref 35.0–45.0)
Hemoglobin: 15.5 g/dL (ref 11.7–15.5)
Lymphs Abs: 2516 cells/uL (ref 850–3900)
MCH: 33.3 pg — ABNORMAL HIGH (ref 27.0–33.0)
MCHC: 35.1 g/dL (ref 32.0–36.0)
MCV: 94.8 fL (ref 80.0–100.0)
MPV: 10.4 fL (ref 7.5–12.5)
Monocytes Relative: 8.3 %
Neutro Abs: 3516 cells/uL (ref 1500–7800)
Neutrophils Relative %: 51.7 %
Platelets: 258 10*3/uL (ref 140–400)
RBC: 4.65 10*6/uL (ref 3.80–5.10)
RDW: 12.2 % (ref 11.0–15.0)
Total Lymphocyte: 37 %
WBC: 6.8 10*3/uL (ref 3.8–10.8)

## 2019-09-09 LAB — LIPID PANEL
Cholesterol: 222 mg/dL — ABNORMAL HIGH (ref <200)
HDL: 49 mg/dL — ABNORMAL LOW (ref >=50)
LDL Cholesterol (Calc): 144 mg/dL (calc) — ABNORMAL HIGH
Non-HDL Cholesterol (Calc): 173 mg/dL (calc) — ABNORMAL HIGH (ref <130)
Total CHOL/HDL Ratio: 4.5 (calc) (ref <5.0)
Triglycerides: 157 mg/dL — ABNORMAL HIGH (ref <150)

## 2019-09-09 LAB — HIV-1 RNA QUANT-NO REFLEX-BLD
HIV 1 RNA Quant: <20 Copies/mL
HIV-1 RNA Quant, Log: <1.3 Log cps/mL

## 2019-09-09 LAB — RPR: RPR Ser Ql: NONREACTIVE

## 2019-09-15 ENCOUNTER — Other Ambulatory Visit: Payer: Self-pay | Admitting: Internal Medicine

## 2019-09-15 DIAGNOSIS — I10 Essential (primary) hypertension: Secondary | ICD-10-CM

## 2019-09-15 DIAGNOSIS — E7800 Pure hypercholesterolemia, unspecified: Secondary | ICD-10-CM

## 2019-09-15 MED FILL — BENAZEPRIL HCL 40 MG TABLET: 40 | 90 days supply | Qty: 90 | Fill #0

## 2019-09-15 MED FILL — ROSUVASTATIN CALCIUM 5 MG T: 5 | 90 days supply | Qty: 90 | Fill #0

## 2019-09-16 ENCOUNTER — Other Ambulatory Visit: Payer: Self-pay | Admitting: Internal Medicine

## 2019-09-16 DIAGNOSIS — B20 Human immunodeficiency virus [HIV] disease: Secondary | ICD-10-CM

## 2019-09-17 ENCOUNTER — Other Ambulatory Visit: Payer: Self-pay | Admitting: Internal Medicine

## 2019-09-20 MED FILL — BIKTARVY 50-200-25 MG TABS: 50-200-25 | 30 days supply | Qty: 30 | Fill #0

## 2019-09-27 ENCOUNTER — Ambulatory Visit (INDEPENDENT_AMBULATORY_CARE_PROVIDER_SITE_OTHER): Payer: Medicare Other | Admitting: Internal Medicine

## 2019-09-27 ENCOUNTER — Other Ambulatory Visit: Payer: Self-pay

## 2019-09-27 DIAGNOSIS — B20 Human immunodeficiency virus [HIV] disease: Secondary | ICD-10-CM

## 2019-09-27 DIAGNOSIS — Z853 Personal history of malignant neoplasm of breast: Secondary | ICD-10-CM | POA: Diagnosis not present

## 2019-09-27 DIAGNOSIS — Z79899 Other long term (current) drug therapy: Secondary | ICD-10-CM

## 2019-09-27 NOTE — Progress Notes (Signed)
Virtual Visit via Telephone Note  I connected with [name] on 09/27/19 at  8:45 AM EDT by telephone and verified that I am speaking with the correct person using two identifiers.  Location: Patient: at home Provider: in clinic   I discussed the limitations, risks, security and privacy concerns of performing an evaluation and management service by telephone and the availability of in person appointments. I also discussed with the patient that there may be a patient responsible charge related to this service. The patient expressed understanding and agreed to proceed.   History of Present Illness:  [name] is a 69yo F with well controlled HIV disease, completed breast cancer treatment last year, occasionally with residual peripheral neuropathy. For the most part, she stays to herself. Has her son who also is there for support. She denies any new issues with her health.     Observations/Objective:  Patient is fluent does not appear to have discomfort Assessment and Plan: hiv disease = continue with biktarvy Long term medication management = will check cr at next blood draw to ensure it is at baseline  Follow Up Instructions: Continue taking medications daily for hiv management. It is well controlled.. We will see back in 3 months. Recommend to get covid-19 vaccine since she is immunocompromised host. For health maintenance = will need flu shot this fall as well.  History of breast cancer = follow up with oncology for serial surveillance   I discussed the assessment and treatment plan with the patient. The patient was provided an opportunity to ask questions and all were answered. The patient agreed with the plan and demonstrated an understanding of the instructions.   The patient was advised to call back or seek an in-person evaluation if the symptoms worsen or if the condition fails to improve as anticipated.  I provided 10 minutes of non-face-to-face time during this  encounter.   Carlyle Basques, MD

## 2019-10-14 MED FILL — CYCLOBENZAPRINE HCL 10 MG T: 10 | 90 days supply | Qty: 90 | Fill #1

## 2019-10-14 MED FILL — BIKTARVY 50-200-25 MG TABS: 50-200-25 | 30 days supply | Qty: 30 | Fill #1

## 2019-10-14 MED FILL — METOPROLOL TARTRATE 25 MG T: 25 | 90 days supply | Qty: 90 | Fill #1

## 2019-10-14 MED FILL — AMLODIPINE BESYLATE 10 MG T: 10 | 90 days supply | Qty: 90 | Fill #1

## 2019-10-20 ENCOUNTER — Telehealth: Payer: Self-pay

## 2019-10-20 NOTE — Telephone Encounter (Signed)
[facility] Patient Advocate Encounter   I was successful in securing patient a $7500 grant from [employer] ([employer]) to provide copayment coverage for Biktarvy.  This will make the out of pocket cost $0.00.     I have spoken with the patient.    The billing information is as follows and has been shared with [facility].           Patient knows to call the office with questions or concerns.  Ferdinando Lodge, CPhT [facility] Phone: [phone] Fax:  [phone]  

## 2019-10-22 ENCOUNTER — Other Ambulatory Visit: Payer: Self-pay | Admitting: Oncology

## 2019-10-22 DIAGNOSIS — Z171 Estrogen receptor negative status [ER-]: Secondary | ICD-10-CM

## 2019-10-22 DIAGNOSIS — C50412 Malignant neoplasm of upper-outer quadrant of left female breast: Secondary | ICD-10-CM

## 2019-11-15 MED FILL — BIKTARVY 50-200-25 MG TABS: 50-200-25 | 30 days supply | Qty: 30 | Fill #2

## 2019-12-13 MED FILL — ROSUVASTATIN CALCIUM 5 MG T: 5 | 90 days supply | Qty: 90 | Fill #1

## 2019-12-13 MED FILL — BIKTARVY 50-200-25 MG TABS: 50-200-25 | 30 days supply | Qty: 30 | Fill #3

## 2019-12-13 MED FILL — BENAZEPRIL HCL 40 MG TABLET: 40 | 90 days supply | Qty: 90 | Fill #1

## 2019-12-13 MED FILL — GABAPENTIN 100 MG CAPSULE: 100 | 90 days supply | Qty: 180 | Fill #2

## 2020-01-05 ENCOUNTER — Other Ambulatory Visit: Payer: Self-pay | Admitting: Internal Medicine

## 2020-01-05 DIAGNOSIS — M62838 Other muscle spasm: Secondary | ICD-10-CM

## 2020-01-05 DIAGNOSIS — I10 Essential (primary) hypertension: Secondary | ICD-10-CM

## 2020-01-05 DIAGNOSIS — E7800 Pure hypercholesterolemia, unspecified: Secondary | ICD-10-CM

## 2020-01-05 DIAGNOSIS — G620 Drug-induced polyneuropathy: Secondary | ICD-10-CM

## 2020-01-06 ENCOUNTER — Other Ambulatory Visit: Payer: Self-pay | Admitting: Internal Medicine

## 2020-01-06 MED FILL — AMLODIPINE BESYLATE 10 MG T: 10 | 30 days supply | Qty: 30 | Fill #0

## 2020-01-06 MED FILL — CYCLOBENZAPRINE HCL 10 MG T: 10 | 30 days supply | Qty: 30 | Fill #0

## 2020-01-10 ENCOUNTER — Ambulatory Visit: Payer: Medicare Other | Admitting: Oncology

## 2020-01-10 ENCOUNTER — Other Ambulatory Visit: Payer: Medicare Other

## 2020-01-10 MED FILL — BIKTARVY 50-200-25 MG TABS: 50-200-25 | 30 days supply | Qty: 30 | Fill #4

## 2020-02-01 ENCOUNTER — Other Ambulatory Visit: Payer: Self-pay | Admitting: Internal Medicine

## 2020-02-01 ENCOUNTER — Other Ambulatory Visit: Payer: Medicare Other

## 2020-02-01 ENCOUNTER — Ambulatory Visit: Payer: Medicare Other | Admitting: Oncology

## 2020-02-01 DIAGNOSIS — M62838 Other muscle spasm: Secondary | ICD-10-CM

## 2020-02-01 DIAGNOSIS — I10 Essential (primary) hypertension: Secondary | ICD-10-CM

## 2020-02-07 MED FILL — BIKTARVY 50-200-25 MG TABS: 50-200-25 | 30 days supply | Qty: 30 | Fill #5

## 2020-02-28 ENCOUNTER — Telehealth: Payer: Medicare Other | Admitting: Internal Medicine

## 2020-02-29 ENCOUNTER — Other Ambulatory Visit: Payer: Self-pay | Admitting: Internal Medicine

## 2020-02-29 DIAGNOSIS — B20 Human immunodeficiency virus [HIV] disease: Secondary | ICD-10-CM

## 2020-03-02 MED FILL — METOPROLOL TARTRATE 25 MG T: 25 | 30 days supply | Qty: 30 | Fill #0

## 2020-03-02 MED FILL — GABAPENTIN 100 MG CAPSULE: 100 | 30 days supply | Qty: 60 | Fill #0

## 2020-03-02 MED FILL — ROSUVASTATIN CALCIUM 5 MG T: 5 | 30 days supply | Qty: 30 | Fill #0

## 2020-03-02 MED FILL — BIKTARVY 50-200-25 MG TABS: 50-200-25 | 30 days supply | Qty: 30 | Fill #0

## 2020-03-02 MED FILL — BENAZEPRIL HCL 40 MG TABLET: 40 | 30 days supply | Qty: 30 | Fill #0

## 2020-03-23 ENCOUNTER — Other Ambulatory Visit: Payer: Self-pay | Admitting: Internal Medicine

## 2020-03-23 DIAGNOSIS — I10 Essential (primary) hypertension: Secondary | ICD-10-CM

## 2020-03-23 DIAGNOSIS — E7800 Pure hypercholesterolemia, unspecified: Secondary | ICD-10-CM

## 2020-03-23 DIAGNOSIS — G620 Drug-induced polyneuropathy: Secondary | ICD-10-CM

## 2020-03-23 DIAGNOSIS — M62838 Other muscle spasm: Secondary | ICD-10-CM

## 2020-03-23 MED FILL — AMLODIPINE BESYLATE 10 MG T: 10 | 30 days supply | Qty: 30 | Fill #0

## 2020-03-23 MED FILL — CYCLOBENZAPRINE HCL 10 MG T: 10 | 30 days supply | Qty: 30 | Fill #0

## 2020-03-23 MED FILL — ROSUVASTATIN CALCIUM 5 MG T: 5 | 30 days supply | Qty: 30 | Fill #0

## 2020-03-23 MED FILL — BENAZEPRIL HCL 40 MG TABLET: 40 | 30 days supply | Qty: 30 | Fill #0

## 2020-03-25 MED FILL — GABAPENTIN 100 MG CAPSULE: 100 | 30 days supply | Qty: 60 | Fill #0

## 2020-03-27 MED FILL — METOPROLOL TARTRATE 25 MG T: 25 | 30 days supply | Qty: 30 | Fill #1

## 2020-03-27 MED FILL — BIKTARVY 50-200-25 MG TABS: 50-200-25 | 30 days supply | Qty: 30 | Fill #1

## 2020-03-30 DIAGNOSIS — Z23 Encounter for immunization: Secondary | ICD-10-CM | POA: Diagnosis not present

## 2020-03-31 ENCOUNTER — Ambulatory Visit: Payer: Medicare Other

## 2020-04-20 ENCOUNTER — Other Ambulatory Visit: Payer: Self-pay | Admitting: Internal Medicine

## 2020-04-20 DIAGNOSIS — G620 Drug-induced polyneuropathy: Secondary | ICD-10-CM

## 2020-04-20 DIAGNOSIS — M62838 Other muscle spasm: Secondary | ICD-10-CM

## 2020-04-20 DIAGNOSIS — I10 Essential (primary) hypertension: Secondary | ICD-10-CM

## 2020-04-20 DIAGNOSIS — E7800 Pure hypercholesterolemia, unspecified: Secondary | ICD-10-CM

## 2020-04-20 DIAGNOSIS — Z23 Encounter for immunization: Secondary | ICD-10-CM | POA: Diagnosis not present

## 2020-04-21 ENCOUNTER — Other Ambulatory Visit (HOSPITAL_COMMUNITY): Payer: Self-pay

## 2020-04-24 MED FILL — CYCLOBENZAPRINE HCL 10 MG T: 10 | 30 days supply | Qty: 30 | Fill #0

## 2020-04-24 MED FILL — METOPROLOL TARTRATE 25 MG T: 25 | 30 days supply | Qty: 30 | Fill #0

## 2020-04-24 MED FILL — GABAPENTIN 100 MG CAPSULE: 100 | 30 days supply | Qty: 60 | Fill #0

## 2020-04-24 MED FILL — BIKTARVY 50-200-25 MG TABS: 50-200-25 | 30 days supply | Qty: 30 | Fill #2

## 2020-04-24 MED FILL — ROSUVASTATIN CALCIUM 5 MG T: 5 | 30 days supply | Qty: 30 | Fill #0

## 2020-04-24 MED FILL — AMLODIPINE BESYLATE 10 MG T: 10 | 30 days supply | Qty: 30 | Fill #0

## 2020-04-24 MED FILL — BENAZEPRIL HCL 40 MG TABLET: 40 | 30 days supply | Qty: 30 | Fill #0

## 2020-05-23 ENCOUNTER — Other Ambulatory Visit: Payer: Self-pay | Admitting: Internal Medicine

## 2020-05-23 ENCOUNTER — Other Ambulatory Visit (HOSPITAL_COMMUNITY): Payer: Self-pay

## 2020-05-23 DIAGNOSIS — E7800 Pure hypercholesterolemia, unspecified: Secondary | ICD-10-CM

## 2020-05-23 DIAGNOSIS — B20 Human immunodeficiency virus [HIV] disease: Secondary | ICD-10-CM

## 2020-05-23 DIAGNOSIS — T451X5A Adverse effect of antineoplastic and immunosuppressive drugs, initial encounter: Secondary | ICD-10-CM

## 2020-05-23 DIAGNOSIS — M62838 Other muscle spasm: Secondary | ICD-10-CM

## 2020-05-23 DIAGNOSIS — I10 Essential (primary) hypertension: Secondary | ICD-10-CM

## 2020-05-23 DIAGNOSIS — G620 Drug-induced polyneuropathy: Secondary | ICD-10-CM

## 2020-05-23 MED ORDER — METOPROLOL TARTRATE 25 MG PO TABS
ORAL_TABLET | ORAL | 0 refills | Status: DC
  Filled 2020-05-23 (×2): qty 30, 30d supply, fill #0

## 2020-05-23 MED ORDER — AMLODIPINE BESYLATE 10 MG PO TABS
ORAL_TABLET | ORAL | 0 refills | Status: DC
  Filled 2020-05-23 (×2): qty 30, 30d supply, fill #0

## 2020-05-23 MED ORDER — BIKTARVY 50-200-25 MG PO TABS
1.0000 | ORAL_TABLET | Freq: Every day | ORAL | 0 refills | Status: DC
  Filled 2020-05-23 (×2): qty 30, 30d supply, fill #0

## 2020-05-23 MED ORDER — ROSUVASTATIN CALCIUM 5 MG PO TABS
ORAL_TABLET | ORAL | 0 refills | Status: DC
  Filled 2020-05-23: qty 30, 30d supply, fill #0
  Filled 2020-05-23: qty 30, fill #0

## 2020-05-23 MED ORDER — GABAPENTIN 100 MG PO CAPS
ORAL_CAPSULE | ORAL | 0 refills | Status: DC
  Filled 2020-05-23: qty 60, fill #0
  Filled 2020-05-23: qty 60, 30d supply, fill #0

## 2020-05-23 MED ORDER — CYCLOBENZAPRINE HCL 10 MG PO TABS
ORAL_TABLET | Freq: Every evening | ORAL | 0 refills | Status: DC | PRN
  Filled 2020-05-23: qty 30, fill #0
  Filled 2020-05-23: qty 30, 30d supply, fill #0

## 2020-05-23 MED ORDER — BENAZEPRIL HCL 40 MG PO TABS
ORAL_TABLET | ORAL | 0 refills | Status: DC
  Filled 2020-05-23 (×2): qty 30, 30d supply, fill #0

## 2020-05-25 ENCOUNTER — Other Ambulatory Visit (HOSPITAL_COMMUNITY): Payer: Self-pay

## 2020-05-30 ENCOUNTER — Other Ambulatory Visit: Payer: Medicare Other

## 2020-05-30 ENCOUNTER — Other Ambulatory Visit (HOSPITAL_COMMUNITY)
Admission: RE | Admit: 2020-05-30 | Discharge: 2020-05-30 | Disposition: A | Discharge status: Home / Self Care | Payer: Medicare Other | Source: Ambulatory Visit | Attending: Internal Medicine | Admitting: Internal Medicine

## 2020-05-30 ENCOUNTER — Other Ambulatory Visit: Payer: Self-pay

## 2020-05-30 DIAGNOSIS — B20 Human immunodeficiency virus [HIV] disease: Secondary | ICD-10-CM

## 2020-05-30 DIAGNOSIS — Z79899 Other long term (current) drug therapy: Secondary | ICD-10-CM

## 2020-05-30 DIAGNOSIS — Z113 Encounter for screening for infections with a predominantly sexual mode of transmission: Secondary | ICD-10-CM

## 2020-05-31 LAB — URINE CYTOLOGY ANCILLARY ONLY
Chlamydia: NEGATIVE
Comment: NEGATIVE
Comment: NORMAL
Neisseria Gonorrhea: NEGATIVE

## 2020-05-31 LAB — T-HELPER CELL (CD4) - (RCID CLINIC ONLY)
CD4 % Helper T Cell: 26 % — ABNORMAL LOW (ref 33–65)
CD4 T Cell Abs: 700 /uL (ref 400–1790)

## 2020-06-01 LAB — COMPREHENSIVE METABOLIC PANEL
AG Ratio: 2.3 (calc) (ref 1.0–2.5)
ALT: 25 U/L (ref 6–29)
AST: 31 U/L (ref 10–35)
Albumin: 5.1 g/dL (ref 3.6–5.1)
Alkaline phosphatase (APISO): 77 U/L (ref 37–153)
BUN: 15 mg/dL (ref 7–25)
CO2: 30 mmol/L (ref 20–32)
Calcium: 10.4 mg/dL (ref 8.6–10.4)
Chloride: 102 mmol/L (ref 98–110)
Creat: 0.97 mg/dL (ref 0.50–0.99)
Globulin: 2.2 g/dL (calc) (ref 1.9–3.7)
Glucose, Bld: 87 mg/dL (ref 65–99)
Potassium: 4.7 mmol/L (ref 3.5–5.3)
Sodium: 141 mmol/L (ref 135–146)
Total Bilirubin: 0.6 mg/dL (ref 0.2–1.2)
Total Protein: 7.3 g/dL (ref 6.1–8.1)

## 2020-06-01 LAB — CBC WITH DIFFERENTIAL/PLATELET
Absolute Monocytes: 539 cells/uL (ref 200–950)
Basophils Absolute: 70 cells/uL (ref 0–200)
Basophils Relative: 1 %
Eosinophils Absolute: 168 cells/uL (ref 15–500)
Eosinophils Relative: 2.4 %
HCT: 44.2 % (ref 35.0–45.0)
Hemoglobin: 15.4 g/dL (ref 11.7–15.5)
Lymphs Abs: 2814 cells/uL (ref 850–3900)
MCH: 33 pg (ref 27.0–33.0)
MCHC: 34.8 g/dL (ref 32.0–36.0)
MCV: 94.8 fL (ref 80.0–100.0)
MPV: 10 fL (ref 7.5–12.5)
Monocytes Relative: 7.7 %
Neutro Abs: 3409 cells/uL (ref 1500–7800)
Neutrophils Relative %: 48.7 %
Platelets: 280 10*3/uL (ref 140–400)
RBC: 4.66 10*6/uL (ref 3.80–5.10)
RDW: 12.3 % (ref 11.0–15.0)
Total Lymphocyte: 40.2 %
WBC: 7 10*3/uL (ref 3.8–10.8)

## 2020-06-01 LAB — LIPID PANEL
Cholesterol: 189 mg/dL (ref <200)
HDL: 48 mg/dL — ABNORMAL LOW (ref >=50)
LDL Cholesterol (Calc): 119 mg/dL (calc) — ABNORMAL HIGH
Non-HDL Cholesterol (Calc): 141 mg/dL (calc) — ABNORMAL HIGH (ref <130)
Total CHOL/HDL Ratio: 3.9 (calc) (ref <5.0)
Triglycerides: 113 mg/dL (ref <150)

## 2020-06-01 LAB — HIV-1 RNA QUANT-NO REFLEX-BLD
HIV 1 RNA Quant: <20 Copies/mL — ABNORMAL HIGH
HIV-1 RNA Quant, Log: <1.3 Log cps/mL — ABNORMAL HIGH

## 2020-06-01 LAB — RPR: RPR Ser Ql: NONREACTIVE

## 2020-06-05 ENCOUNTER — Other Ambulatory Visit: Payer: Self-pay | Admitting: Oncology

## 2020-06-05 ENCOUNTER — Ambulatory Visit
Admission: RE | Admit: 2020-06-05 | Discharge: 2020-06-05 | Disposition: A | Discharge status: Home / Self Care | Payer: Medicare Other | Source: Ambulatory Visit | Attending: Oncology | Admitting: Oncology

## 2020-06-05 ENCOUNTER — Other Ambulatory Visit: Payer: Self-pay

## 2020-06-05 DIAGNOSIS — C50412 Malignant neoplasm of upper-outer quadrant of left female breast: Secondary | ICD-10-CM

## 2020-06-05 DIAGNOSIS — Z171 Estrogen receptor negative status [ER-]: Secondary | ICD-10-CM

## 2020-06-05 DIAGNOSIS — N6320 Unspecified lump in the left breast, unspecified quadrant: Secondary | ICD-10-CM

## 2020-06-05 DIAGNOSIS — N6321 Unspecified lump in the left breast, upper outer quadrant: Secondary | ICD-10-CM | POA: Diagnosis not present

## 2020-06-05 DIAGNOSIS — Z853 Personal history of malignant neoplasm of breast: Secondary | ICD-10-CM | POA: Diagnosis not present

## 2020-06-05 DIAGNOSIS — R928 Other abnormal and inconclusive findings on diagnostic imaging of breast: Secondary | ICD-10-CM | POA: Diagnosis not present

## 2020-06-07 ENCOUNTER — Other Ambulatory Visit: Payer: Medicare Other

## 2020-06-08 ENCOUNTER — Ambulatory Visit
Admission: RE | Admit: 2020-06-08 | Discharge: 2020-06-08 | Disposition: A | Discharge status: Home / Self Care | Payer: Medicare Other | Source: Ambulatory Visit | Attending: Oncology | Admitting: Oncology

## 2020-06-08 ENCOUNTER — Other Ambulatory Visit: Payer: Self-pay

## 2020-06-08 DIAGNOSIS — D242 Benign neoplasm of left breast: Secondary | ICD-10-CM | POA: Diagnosis not present

## 2020-06-08 DIAGNOSIS — N6321 Unspecified lump in the left breast, upper outer quadrant: Secondary | ICD-10-CM | POA: Diagnosis not present

## 2020-06-08 DIAGNOSIS — N6320 Unspecified lump in the left breast, unspecified quadrant: Secondary | ICD-10-CM

## 2020-06-08 DIAGNOSIS — D241 Benign neoplasm of right breast: Secondary | ICD-10-CM | POA: Diagnosis not present

## 2020-06-09 ENCOUNTER — Other Ambulatory Visit: Payer: Self-pay | Admitting: Oncology

## 2020-06-09 DIAGNOSIS — N6320 Unspecified lump in the left breast, unspecified quadrant: Secondary | ICD-10-CM

## 2020-06-13 DIAGNOSIS — H5211 Myopia, right eye: Secondary | ICD-10-CM | POA: Diagnosis not present

## 2020-06-14 ENCOUNTER — Telehealth (INDEPENDENT_AMBULATORY_CARE_PROVIDER_SITE_OTHER): Payer: Medicare Other | Admitting: Internal Medicine

## 2020-06-14 ENCOUNTER — Ambulatory Visit
Admission: RE | Admit: 2020-06-14 | Discharge: 2020-06-14 | Disposition: A | Discharge status: Home / Self Care | Payer: Medicare Other | Source: Ambulatory Visit | Attending: Oncology | Admitting: Oncology

## 2020-06-14 ENCOUNTER — Other Ambulatory Visit: Payer: Self-pay

## 2020-06-14 ENCOUNTER — Encounter: Payer: Medicare Other | Admitting: Internal Medicine

## 2020-06-14 DIAGNOSIS — N6321 Unspecified lump in the left breast, upper outer quadrant: Secondary | ICD-10-CM | POA: Diagnosis not present

## 2020-06-14 DIAGNOSIS — B20 Human immunodeficiency virus [HIV] disease: Secondary | ICD-10-CM

## 2020-06-14 DIAGNOSIS — N6320 Unspecified lump in the left breast, unspecified quadrant: Secondary | ICD-10-CM

## 2020-06-14 DIAGNOSIS — E782 Mixed hyperlipidemia: Secondary | ICD-10-CM

## 2020-06-14 DIAGNOSIS — Z79899 Other long term (current) drug therapy: Secondary | ICD-10-CM | POA: Diagnosis not present

## 2020-06-14 DIAGNOSIS — Z853 Personal history of malignant neoplasm of breast: Secondary | ICD-10-CM

## 2020-06-14 NOTE — Progress Notes (Signed)
Virtual Visit via Telephone Note  I connected with [name] on 06/14/20 at  1:45 PM EDT by telephone and verified that I am speaking with the correct person using two identifiers.  Location: Patient: home Provider: clinic   I discussed the limitations, risks, security and privacy concerns of performing an evaluation and management service by telephone and the availability of in person appointments. I also discussed with the patient that there may be a patient responsible charge related to this service. The patient expressed understanding and agreed to proceed.   History of Present Illness: Doing well. Received 2 doses of pfizer in march 2022.   12 point ros is negative. +/- 5 lb. Mammogram had suspicious lesions, she had biopsy - calcification and scar tissue on 1 site. Then had a 2nd site biopsy done this morning Observations/Objective: fluent speech  Assessment and Plan: HIV disease =   Breast abn lesion = had biopsy today to watch for results   Follow Up Instructions:  hiv disease = continue on current regimen  Long term medication management = cr is stable  Breast abn lesion = to call us if malignancy so that to review for any drug interaction  hld = well controlled.  Health maintenance = Going to have booster in August    I discussed the assessment and treatment plan with the patient. The patient was provided an opportunity to ask questions and all were answered. The patient agreed with the plan and demonstrated an understanding of the instructions.   The patient was advised to call back or seek an in-person evaluation if the symptoms worsen or if the condition fails to improve as anticipated.  I provided 10 minutes of non-face-to-face time during this encounter.   Carlyle Basques, MD

## 2020-06-20 ENCOUNTER — Other Ambulatory Visit: Payer: Self-pay | Admitting: Internal Medicine

## 2020-06-20 ENCOUNTER — Other Ambulatory Visit (HOSPITAL_COMMUNITY): Payer: Self-pay

## 2020-06-20 DIAGNOSIS — B20 Human immunodeficiency virus [HIV] disease: Secondary | ICD-10-CM

## 2020-06-20 DIAGNOSIS — G620 Drug-induced polyneuropathy: Secondary | ICD-10-CM

## 2020-06-20 DIAGNOSIS — E7800 Pure hypercholesterolemia, unspecified: Secondary | ICD-10-CM

## 2020-06-20 DIAGNOSIS — M62838 Other muscle spasm: Secondary | ICD-10-CM

## 2020-06-20 DIAGNOSIS — I10 Essential (primary) hypertension: Secondary | ICD-10-CM

## 2020-06-21 ENCOUNTER — Encounter: Payer: Self-pay | Admitting: Adult Health

## 2020-06-21 ENCOUNTER — Other Ambulatory Visit (HOSPITAL_COMMUNITY): Payer: Self-pay

## 2020-06-21 MED ORDER — AMLODIPINE BESYLATE 10 MG PO TABS
ORAL_TABLET | ORAL | 3 refills | Status: DC
Start: 2020-06-21 — End: 2020-10-11
  Filled 2020-06-21: qty 30, 30d supply, fill #0
  Filled 2020-07-20: qty 30, 30d supply, fill #1
  Filled 2020-08-21: qty 30, 30d supply, fill #2
  Filled 2020-09-18: qty 30, 30d supply, fill #3

## 2020-06-21 MED ORDER — GABAPENTIN 100 MG PO CAPS
ORAL_CAPSULE | ORAL | 3 refills | Status: DC
  Filled 2020-06-21: qty 60, 30d supply, fill #0
  Filled 2020-07-20: qty 60, 30d supply, fill #1
  Filled 2020-08-21: qty 60, 30d supply, fill #2
  Filled 2020-09-18: qty 60, 30d supply, fill #3

## 2020-06-21 MED ORDER — METOPROLOL TARTRATE 25 MG PO TABS
ORAL_TABLET | ORAL | 3 refills | Status: DC
  Filled 2020-06-21: qty 30, 30d supply, fill #0
  Filled 2020-07-20: qty 30, 30d supply, fill #1
  Filled 2020-08-21: qty 30, 30d supply, fill #2
  Filled 2020-09-18: qty 30, 30d supply, fill #3

## 2020-06-21 MED ORDER — BENAZEPRIL HCL 40 MG PO TABS
ORAL_TABLET | ORAL | 3 refills | Status: DC
  Filled 2020-06-21: qty 30, 30d supply, fill #0
  Filled 2020-07-20: qty 30, 30d supply, fill #1
  Filled 2020-08-21: qty 30, 30d supply, fill #2
  Filled 2020-09-18: qty 30, 30d supply, fill #3

## 2020-06-21 MED ORDER — ROSUVASTATIN CALCIUM 5 MG PO TABS
ORAL_TABLET | ORAL | 3 refills | Status: DC
  Filled 2020-06-21: qty 30, 30d supply, fill #0
  Filled 2020-07-20: qty 30, 30d supply, fill #1
  Filled 2020-08-21: qty 30, 30d supply, fill #2
  Filled 2020-09-18: qty 30, 30d supply, fill #3

## 2020-06-21 MED ORDER — CYCLOBENZAPRINE HCL 10 MG PO TABS
ORAL_TABLET | Freq: Every evening | ORAL | 3 refills | Status: DC | PRN
  Filled 2020-06-21: qty 30, 30d supply, fill #0
  Filled 2020-07-20: qty 30, 30d supply, fill #1
  Filled 2020-08-21: qty 30, 30d supply, fill #2
  Filled 2020-09-18: qty 30, 30d supply, fill #3

## 2020-06-21 MED ORDER — BIKTARVY 50-200-25 MG PO TABS
1.0000 | ORAL_TABLET | Freq: Every day | ORAL | 3 refills | Status: DC
Start: 2020-06-21 — End: 2020-10-11
  Filled 2020-06-21: qty 30, 30d supply, fill #0
  Filled 2020-07-20: qty 30, 30d supply, fill #1
  Filled 2020-08-21: qty 30, 30d supply, fill #2
  Filled 2020-09-18: qty 30, 30d supply, fill #3

## 2020-06-22 ENCOUNTER — Other Ambulatory Visit (HOSPITAL_COMMUNITY): Payer: Self-pay

## 2020-06-29 IMAGING — CT CT ABD-PELV W/ CM
2 of 5 series · 14 of 46 positions shown, 16 images · IV contrast (APPLIED)
Comparison: Chest x-rays dated 01/01/2016 and 03/21/2007. No prior
chest CT.

CLINICAL DATA: Invasive breast cancer, progression suspected,
restaging.

History of LEFT breast cancer status post LEFT lumpectomy and
completion of chemotherapy and radiation therapy. Now with LEFT
lateral chest wall pain, pain while eating and tightness in chest
for 3 weeks which is worsening. History of HIV.
EXAM:
CT CHEST, ABDOMEN, AND PELVIS WITH CONTRAST
TECHNIQUE: Multidetector CT imaging of the chest, abdomen and pelvis was
performed following the standard protocol during bolus
administration of intravenous contrast.
CONTRAST:  100mL OMNIPAQUE IOHEXOL 300 MG/ML  SOLN

[Series 2: cap with · axial · 0.90mm/px · z∈[-752,-217]mm · 11 of 129 slices shown, 13 images]
[im 11/129  soft-tissue]
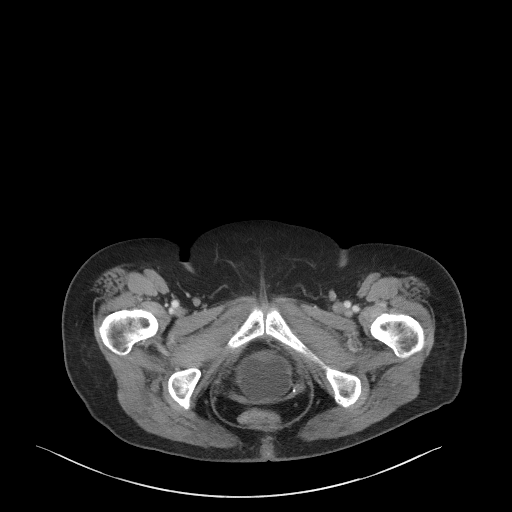
[im 11/129  bone]
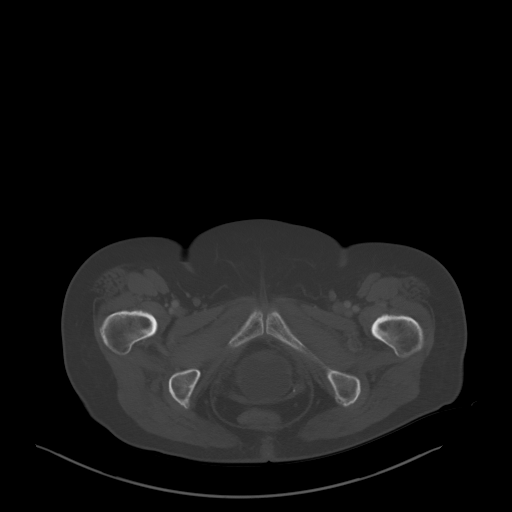
[im 22/129  soft-tissue]
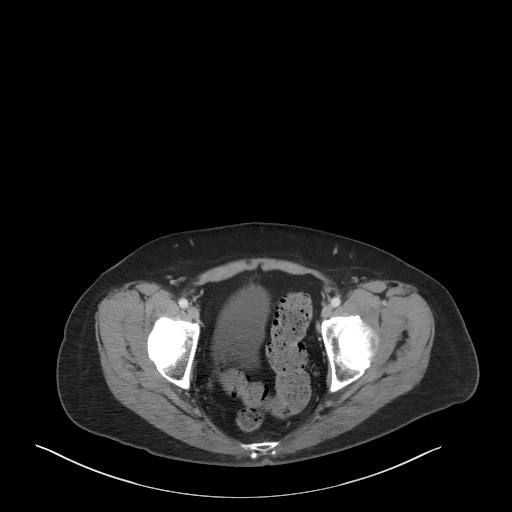
[im 33/129  soft-tissue]
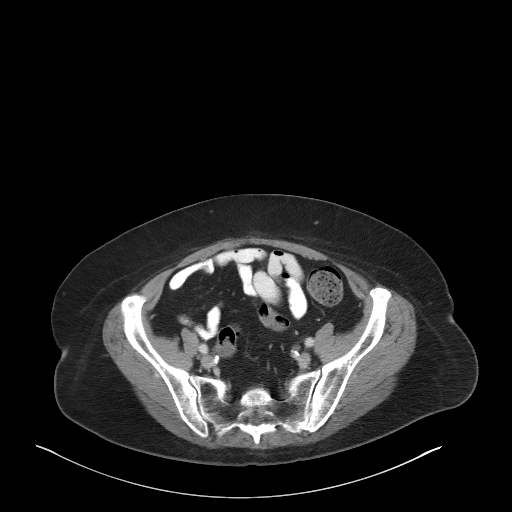
[im 43/129  soft-tissue]
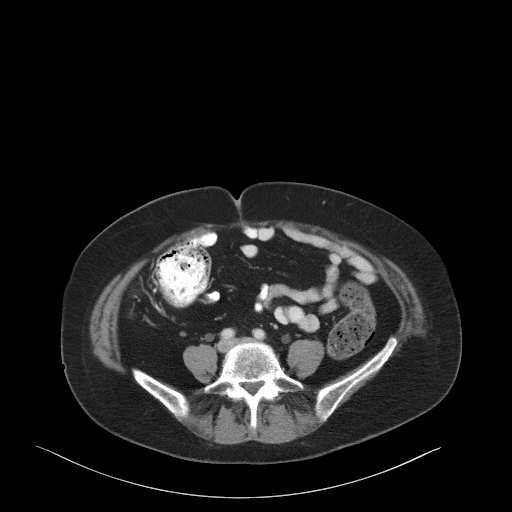
[im 54/129  soft-tissue]
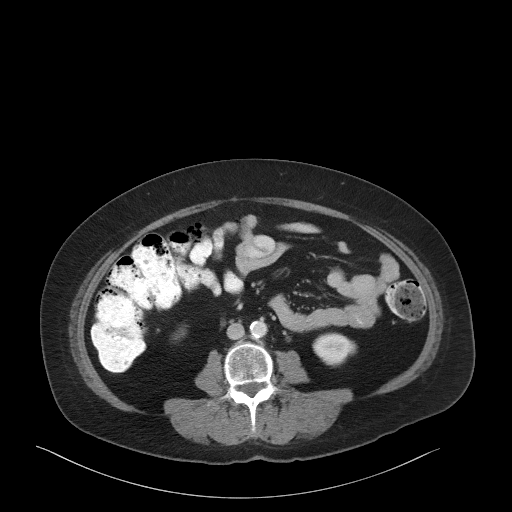
[im 65/129  soft-tissue]
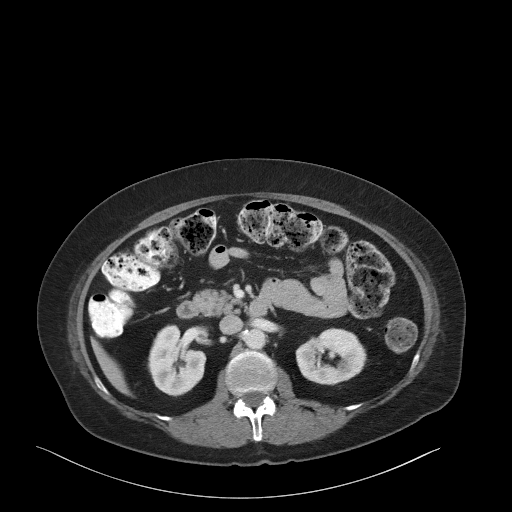
[im 75/129  soft-tissue]
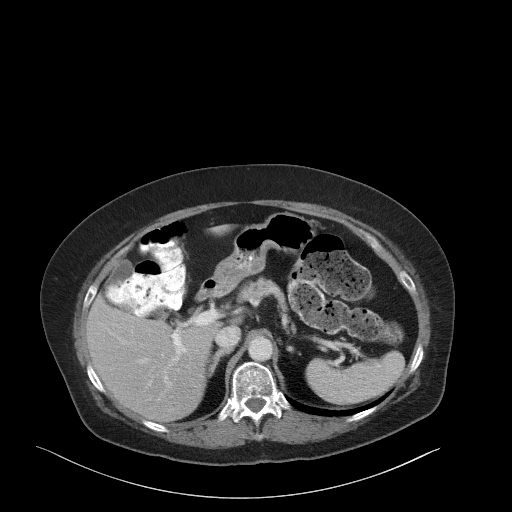
[im 86/129  soft-tissue]
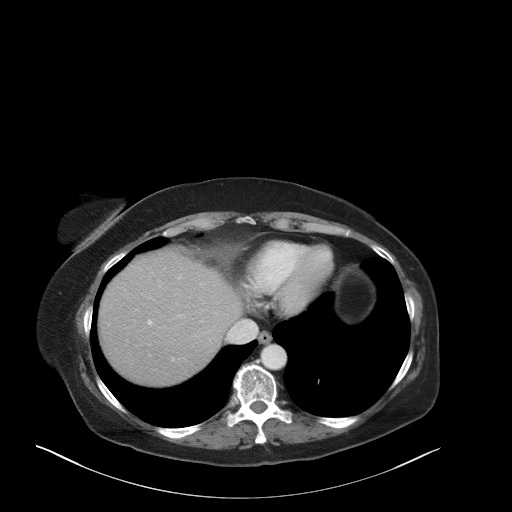
[im 97/129  soft-tissue]
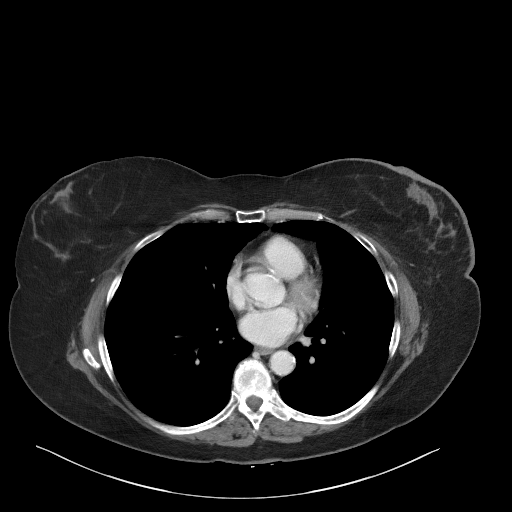
[im 97/129  bone]
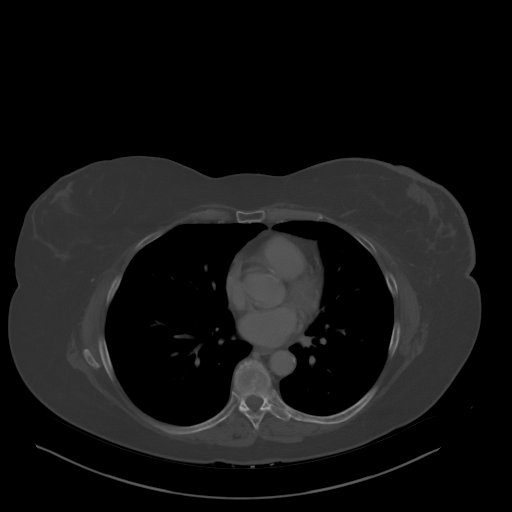
[im 107/129  soft-tissue]
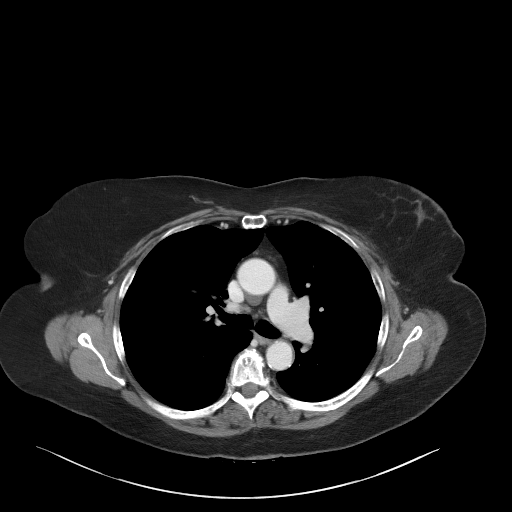
[im 118/129  soft-tissue]
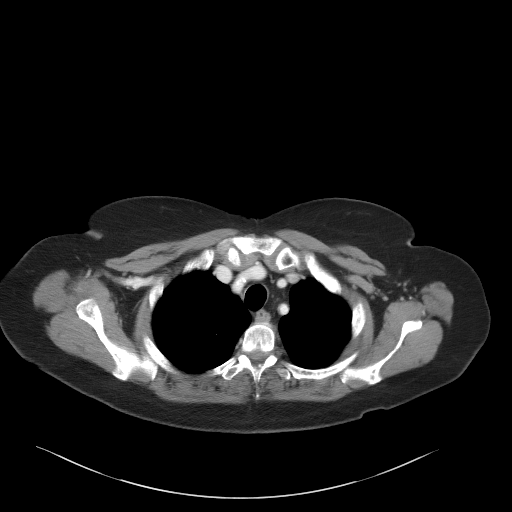

[Series 5: coronals · coronal · 0.82mm/px · 3 of 155 slices shown]
[im 52/155  soft-tissue]
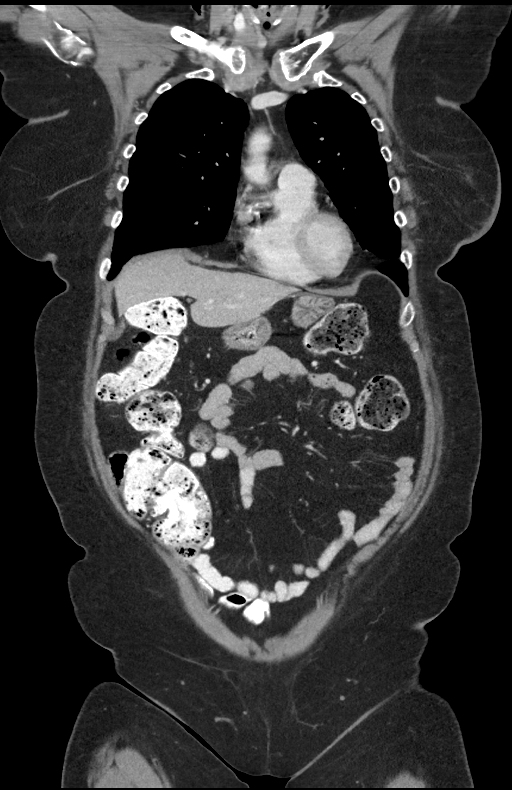
[im 69/155  soft-tissue]
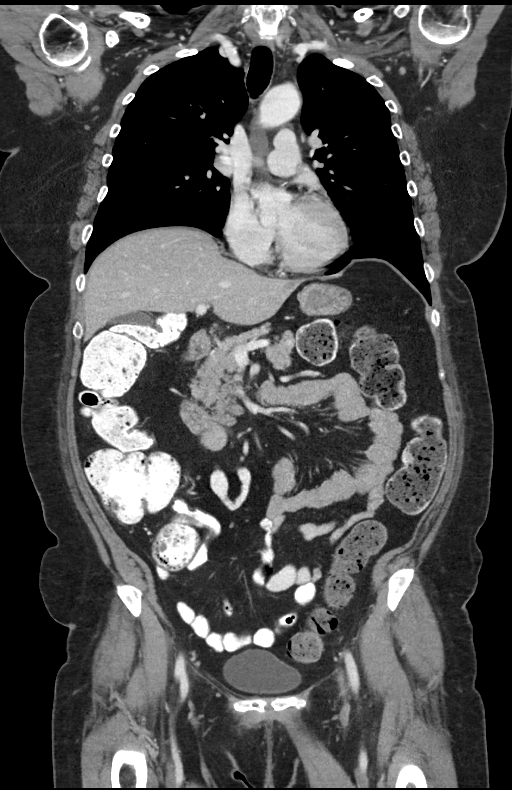
[im 86/155  soft-tissue]
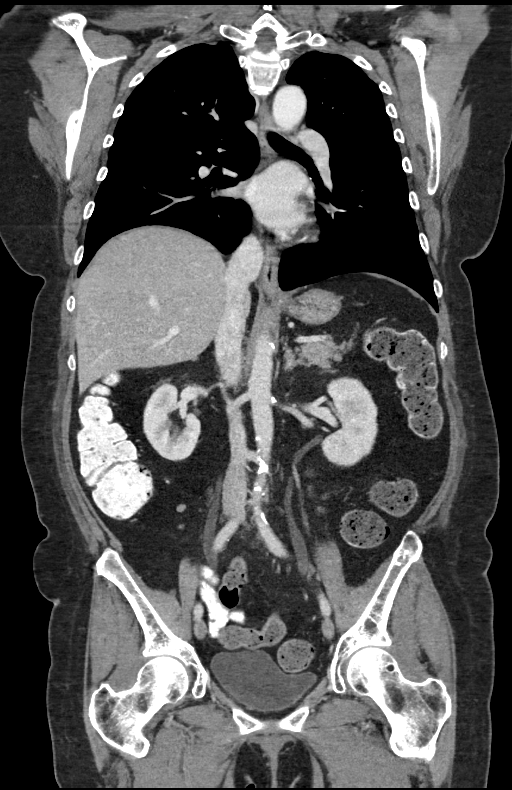

[14 of 46 positions shown; findings below may reference images not displayed]

FINDINGS: CT CHEST FINDINGS

Cardiovascular: Heart size is normal. No pericardial effusion.
Scattered coronary artery calcifications. Mild aortic
atherosclerosis. No thoracic aortic aneurysm or evidence of aortic
dissection. No central obstructing pulmonary embolism seen.

Mediastinum/Nodes: No mass or enlarged lymph nodes within the
mediastinum or perihilar regions. Esophagus appears normal. Trachea
and central bronchi are unremarkable.

Lungs/Pleura: Lungs are clear.  No pleural effusion or pneumothorax.

Musculoskeletal: Sclerotic focus within the LEFT anterolateral first
rib, of uncertain chronicity or etiology. No other acute or
suspicious osseous finding within the chest. No acute or suspicious
finding within the thoracic spine. Mild degenerative spurring within
the slightly scoliotic thoracic spine.

CT ABDOMEN PELVIS FINDINGS

Hepatobiliary: No focal liver abnormality is seen. No gallstones,
gallbladder wall thickening, or biliary dilatation.

Pancreas: Unremarkable. No pancreatic ductal dilatation or
surrounding inflammatory changes.

Spleen: Normal in size without focal abnormality.

Adrenals/Urinary Tract: Adrenal glands appear normal. Kidneys appear
normal without mass, stone or hydronephrosis. No ureteral or bladder
calculi identified. Bladder appears normal in configuration, likely
prolapsed to some degree.

Stomach/Bowel: No dilated large or small bowel loops. No bowel wall
thickening or evidence of bowel wall inflammation seen. Stomach
appears normal, partially decompressed. Fairly large amount of stool
throughout the nondistended colon.

Vascular/Lymphatic: Aortic atherosclerosis. No enlarged abdominal or
pelvic lymph nodes.

Reproductive: Presumed hysterectomy.  No adnexal mass or free fluid.

Other: No free fluid or abscess collection. No soft tissue mass. No
free intraperitoneal air.

Musculoskeletal: Mild degenerative spurring within the lumbar spine.
No acute or suspicious osseous finding.
IMPRESSION: 1. Sclerosis of the LEFT first rib, of uncertain chronicity or
etiology but may have been present on the earlier chest x-rays
dating back to 7113 raising the possibility of old trauma, Paget's
disease or other benign condition. However, this is suspicious given
the history of LEFT-sided chest pain, and early osseous metastasis
cannot be confidently excluded. As such, PET-CT is recommended for
more definitive characterization.
2. No other acute or significant findings within the chest, abdomen
and pelvis.
3. Fairly large amount of stool throughout the nondistended colon
(constipation?).
4.  Aortic Atherosclerosis ([id number]-[id number].H).
5. Additional chronic/incidental findings detailed above.

## 2020-07-04 ENCOUNTER — Other Ambulatory Visit (HOSPITAL_COMMUNITY): Payer: Self-pay

## 2020-07-18 ENCOUNTER — Other Ambulatory Visit (HOSPITAL_COMMUNITY): Payer: Self-pay

## 2020-07-20 ENCOUNTER — Other Ambulatory Visit (HOSPITAL_COMMUNITY): Payer: Self-pay

## 2020-07-20 ENCOUNTER — Encounter: Payer: Self-pay | Admitting: Adult Health

## 2020-08-15 ENCOUNTER — Other Ambulatory Visit (HOSPITAL_COMMUNITY): Payer: Self-pay

## 2020-08-21 ENCOUNTER — Encounter: Payer: Self-pay | Admitting: Adult Health

## 2020-08-21 ENCOUNTER — Other Ambulatory Visit (HOSPITAL_COMMUNITY): Payer: Self-pay

## 2020-09-13 ENCOUNTER — Other Ambulatory Visit (HOSPITAL_COMMUNITY): Payer: Self-pay

## 2020-09-18 ENCOUNTER — Encounter: Payer: Self-pay | Admitting: Adult Health

## 2020-09-18 ENCOUNTER — Other Ambulatory Visit (HOSPITAL_COMMUNITY): Payer: Self-pay

## 2020-10-11 ENCOUNTER — Other Ambulatory Visit: Payer: Self-pay | Admitting: Internal Medicine

## 2020-10-11 ENCOUNTER — Other Ambulatory Visit (HOSPITAL_COMMUNITY): Payer: Self-pay

## 2020-10-11 ENCOUNTER — Telehealth: Payer: Self-pay

## 2020-10-11 DIAGNOSIS — G620 Drug-induced polyneuropathy: Secondary | ICD-10-CM

## 2020-10-11 DIAGNOSIS — I10 Essential (primary) hypertension: Secondary | ICD-10-CM

## 2020-10-11 DIAGNOSIS — M62838 Other muscle spasm: Secondary | ICD-10-CM

## 2020-10-11 DIAGNOSIS — T451X5A Adverse effect of antineoplastic and immunosuppressive drugs, initial encounter: Secondary | ICD-10-CM

## 2020-10-11 DIAGNOSIS — B20 Human immunodeficiency virus [HIV] disease: Secondary | ICD-10-CM

## 2020-10-11 DIAGNOSIS — E7800 Pure hypercholesterolemia, unspecified: Secondary | ICD-10-CM

## 2020-10-11 MED ORDER — BIKTARVY 50-200-25 MG PO TABS
1.0000 | ORAL_TABLET | Freq: Every day | ORAL | 3 refills | Status: DC
Start: 2020-10-11 — End: 2021-01-02
  Filled 2020-10-11: qty 30, 30d supply, fill #0
  Filled 2020-11-09: qty 30, 30d supply, fill #1
  Filled 2020-12-11: qty 30, 30d supply, fill #2

## 2020-10-11 MED ORDER — BENAZEPRIL HCL 40 MG PO TABS
ORAL_TABLET | ORAL | 3 refills | Status: DC
Start: 2020-10-11 — End: 2021-01-02
  Filled 2020-10-11: qty 30, 30d supply, fill #0
  Filled 2020-11-09: qty 30, 30d supply, fill #1
  Filled 2020-12-11: qty 30, 30d supply, fill #2

## 2020-10-11 MED ORDER — GABAPENTIN 100 MG PO CAPS
ORAL_CAPSULE | ORAL | 3 refills | Status: DC
Start: 2020-10-11 — End: 2021-01-02
  Filled 2020-10-11: qty 60, 30d supply, fill #0
  Filled 2020-12-11: qty 60, 30d supply, fill #1

## 2020-10-11 MED ORDER — CYCLOBENZAPRINE HCL 10 MG PO TABS
ORAL_TABLET | Freq: Every evening | ORAL | 3 refills | Status: DC | PRN
  Filled 2020-10-11: qty 30, 30d supply, fill #0
  Filled 2020-11-09: qty 30, 30d supply, fill #1
  Filled 2020-12-11: qty 30, 30d supply, fill #2
  Filled 2021-01-03: qty 30, 30d supply, fill #3

## 2020-10-11 MED ORDER — ROSUVASTATIN CALCIUM 5 MG PO TABS
ORAL_TABLET | ORAL | 3 refills | Status: DC
Start: 2020-10-11 — End: 2021-01-02
  Filled 2020-10-11: qty 30, 30d supply, fill #0
  Filled 2020-11-09: qty 30, 30d supply, fill #1
  Filled 2020-12-11: qty 30, 30d supply, fill #2

## 2020-10-11 MED ORDER — AMLODIPINE BESYLATE 10 MG PO TABS
ORAL_TABLET | ORAL | 3 refills | Status: DC
  Filled 2020-10-11: qty 30, 30d supply, fill #0
  Filled 2020-11-09: qty 30, 30d supply, fill #1
  Filled 2020-12-11: qty 30, 30d supply, fill #2

## 2020-10-11 MED ORDER — METOPROLOL TARTRATE 25 MG PO TABS
ORAL_TABLET | ORAL | 3 refills | Status: DC
Start: 2020-10-11 — End: 2021-01-02
  Filled 2020-10-11: qty 30, 30d supply, fill #0
  Filled 2020-11-09: qty 30, 30d supply, fill #1
  Filled 2020-12-11: qty 30, 30d supply, fill #2

## 2020-10-11 NOTE — Telephone Encounter (Addendum)
Attempted to call patient after receiving refill request for for blood pressure medication. Patient is not established with a PCP. Encouraged patient to locate a PCP to ensure she is up to date regarding overall health; also to help manager her BP. Patient would prefer to discuss needing a PCP at her next appointment in November. [name], RMA

## 2020-10-11 NOTE — Telephone Encounter (Signed)
Please advise on refills. Patient would like to discuss need for PCP at next visit.

## 2020-10-16 ENCOUNTER — Other Ambulatory Visit (HOSPITAL_COMMUNITY): Payer: Self-pay

## 2020-10-20 ENCOUNTER — Other Ambulatory Visit (HOSPITAL_COMMUNITY): Payer: Self-pay

## 2020-10-20 ENCOUNTER — Telehealth: Payer: Self-pay

## 2020-10-20 ENCOUNTER — Encounter: Payer: Self-pay | Admitting: Adult Health

## 2020-10-20 NOTE — Telephone Encounter (Signed)
[facility] Patient Advocate Encounter   I was successful in securing patient a $7500.00 grant from [employer] ([employer]) to provide copayment coverage for Biktarvy.  This will make the out of pocket cost $0.00.     I have spoken with the patient.        Patient knows to call the office with questions or concerns.  Ileen Kahre, CPhT [facility] Phone: [phone] Fax:  [phone]  

## 2020-11-09 ENCOUNTER — Other Ambulatory Visit (HOSPITAL_COMMUNITY): Payer: Self-pay

## 2020-11-13 ENCOUNTER — Other Ambulatory Visit (HOSPITAL_COMMUNITY): Payer: Self-pay

## 2020-11-29 DIAGNOSIS — Z23 Encounter for immunization: Secondary | ICD-10-CM | POA: Diagnosis not present

## 2020-12-06 ENCOUNTER — Other Ambulatory Visit (HOSPITAL_COMMUNITY): Payer: Self-pay

## 2020-12-07 ENCOUNTER — Other Ambulatory Visit (HOSPITAL_COMMUNITY): Payer: Self-pay

## 2020-12-11 ENCOUNTER — Other Ambulatory Visit (HOSPITAL_COMMUNITY): Payer: Self-pay

## 2020-12-14 ENCOUNTER — Other Ambulatory Visit: Payer: Self-pay

## 2020-12-14 DIAGNOSIS — B20 Human immunodeficiency virus [HIV] disease: Secondary | ICD-10-CM

## 2020-12-14 DIAGNOSIS — Z79899 Other long term (current) drug therapy: Secondary | ICD-10-CM

## 2020-12-18 ENCOUNTER — Other Ambulatory Visit: Payer: Self-pay

## 2020-12-18 ENCOUNTER — Other Ambulatory Visit: Payer: Medicare Other

## 2020-12-18 DIAGNOSIS — B20 Human immunodeficiency virus [HIV] disease: Secondary | ICD-10-CM

## 2020-12-18 DIAGNOSIS — Z79899 Other long term (current) drug therapy: Secondary | ICD-10-CM

## 2020-12-19 LAB — T-HELPER CELL (CD4) - (RCID CLINIC ONLY)
CD4 % Helper T Cell: 25 % — ABNORMAL LOW (ref 33–65)
CD4 T Cell Abs: 644 /uL (ref 400–1790)

## 2020-12-20 LAB — CBC WITH DIFFERENTIAL/PLATELET
Absolute Monocytes: 539 cells/uL (ref 200–950)
Basophils Absolute: 77 cells/uL (ref 0–200)
Basophils Relative: 1.1 %
Eosinophils Absolute: 189 cells/uL (ref 15–500)
Eosinophils Relative: 2.7 %
HCT: 44.7 % (ref 35.0–45.0)
Hemoglobin: 15.5 g/dL (ref 11.7–15.5)
Lymphs Abs: 2870 cells/uL (ref 850–3900)
MCH: 32.4 pg (ref 27.0–33.0)
MCHC: 34.7 g/dL (ref 32.0–36.0)
MCV: 93.3 fL (ref 80.0–100.0)
MPV: 9.9 fL (ref 7.5–12.5)
Monocytes Relative: 7.7 %
Neutro Abs: 3325 cells/uL (ref 1500–7800)
Neutrophils Relative %: 47.5 %
Platelets: 304 10*3/uL (ref 140–400)
RBC: 4.79 10*6/uL (ref 3.80–5.10)
RDW: 12 % (ref 11.0–15.0)
Total Lymphocyte: 41 %
WBC: 7 10*3/uL (ref 3.8–10.8)

## 2020-12-20 LAB — COMPREHENSIVE METABOLIC PANEL
AG Ratio: 2 (calc) (ref 1.0–2.5)
ALT: 24 U/L (ref 6–29)
AST: 33 U/L (ref 10–35)
Albumin: 4.9 g/dL (ref 3.6–5.1)
Alkaline phosphatase (APISO): 72 U/L (ref 37–153)
BUN: 10 mg/dL (ref 7–25)
CO2: 30 mmol/L (ref 20–32)
Calcium: 10.2 mg/dL (ref 8.6–10.4)
Chloride: 104 mmol/L (ref 98–110)
Creat: 0.94 mg/dL (ref 0.50–1.05)
Globulin: 2.5 g/dL (calc) (ref 1.9–3.7)
Glucose, Bld: 91 mg/dL (ref 65–99)
Potassium: 4.6 mmol/L (ref 3.5–5.3)
Sodium: 143 mmol/L (ref 135–146)
Total Bilirubin: 0.6 mg/dL (ref 0.2–1.2)
Total Protein: 7.4 g/dL (ref 6.1–8.1)

## 2020-12-20 LAB — LIPID PANEL
Cholesterol: 181 mg/dL (ref <200)
HDL: 49 mg/dL — ABNORMAL LOW (ref >=50)
LDL Cholesterol (Calc): 111 mg/dL (calc) — ABNORMAL HIGH
Non-HDL Cholesterol (Calc): 132 mg/dL (calc) — ABNORMAL HIGH (ref <130)
Total CHOL/HDL Ratio: 3.7 (calc) (ref <5.0)
Triglycerides: 105 mg/dL (ref <150)

## 2020-12-20 LAB — HIV-1 RNA QUANT-NO REFLEX-BLD
HIV 1 RNA Quant: NOT DETECTED Copies/mL
HIV-1 RNA Quant, Log: NOT DETECTED Log cps/mL

## 2021-01-01 ENCOUNTER — Telehealth: Payer: Medicare Other | Admitting: Internal Medicine

## 2021-01-02 ENCOUNTER — Ambulatory Visit (INDEPENDENT_AMBULATORY_CARE_PROVIDER_SITE_OTHER): Payer: Medicare Other | Admitting: Internal Medicine

## 2021-01-02 ENCOUNTER — Other Ambulatory Visit: Payer: Self-pay

## 2021-01-02 ENCOUNTER — Other Ambulatory Visit (HOSPITAL_COMMUNITY): Payer: Self-pay

## 2021-01-02 ENCOUNTER — Encounter: Payer: Self-pay | Admitting: Internal Medicine

## 2021-01-02 DIAGNOSIS — I10 Essential (primary) hypertension: Secondary | ICD-10-CM

## 2021-01-02 DIAGNOSIS — G620 Drug-induced polyneuropathy: Secondary | ICD-10-CM | POA: Diagnosis not present

## 2021-01-02 DIAGNOSIS — E7800 Pure hypercholesterolemia, unspecified: Secondary | ICD-10-CM | POA: Diagnosis not present

## 2021-01-02 DIAGNOSIS — T451X5A Adverse effect of antineoplastic and immunosuppressive drugs, initial encounter: Secondary | ICD-10-CM

## 2021-01-02 DIAGNOSIS — B20 Human immunodeficiency virus [HIV] disease: Secondary | ICD-10-CM

## 2021-01-02 MED ORDER — GABAPENTIN 100 MG PO CAPS
ORAL_CAPSULE | ORAL | 11 refills | Status: DC
  Filled 2021-01-02: qty 60, 30d supply, fill #0
  Filled 2021-01-26: qty 60, 30d supply, fill #1
  Filled 2021-02-27: qty 60, 30d supply, fill #2
  Filled 2021-03-26: qty 60, 30d supply, fill #3
  Filled 2021-04-20: qty 60, 30d supply, fill #4
  Filled 2021-05-17: qty 60, 30d supply, fill #5
  Filled 2021-06-14: qty 60, 30d supply, fill #6
  Filled 2021-07-10: qty 60, 30d supply, fill #7
  Filled 2021-08-06: qty 60, 30d supply, fill #8
  Filled 2021-08-30: qty 60, 30d supply, fill #9
  Filled 2021-10-06: qty 39, 19d supply, fill #10
  Filled 2021-10-06: qty 21, 11d supply, fill #10
  Filled 2021-11-20: qty 60, 30d supply, fill #11

## 2021-01-02 MED ORDER — BIKTARVY 50-200-25 MG PO TABS
1.0000 | ORAL_TABLET | Freq: Every day | ORAL | 11 refills | Status: DC
  Filled 2021-01-02 – 2021-01-03 (×2): qty 30, 30d supply, fill #0
  Filled 2021-02-01: qty 30, 30d supply, fill #1
  Filled 2021-02-27: qty 30, 30d supply, fill #2
  Filled 2021-03-26: qty 30, 30d supply, fill #3
  Filled 2021-04-20: qty 30, 30d supply, fill #4
  Filled 2021-05-17: qty 30, 30d supply, fill #5
  Filled 2021-06-14: qty 30, 30d supply, fill #6
  Filled 2021-07-10: qty 30, 30d supply, fill #7
  Filled 2021-08-06: qty 30, 30d supply, fill #8
  Filled 2021-08-30: qty 30, 30d supply, fill #9
  Filled 2021-09-24: qty 30, 30d supply, fill #10
  Filled 2021-10-25: qty 30, 30d supply, fill #11

## 2021-01-02 MED ORDER — METOPROLOL TARTRATE 25 MG PO TABS
ORAL_TABLET | ORAL | 11 refills | Status: DC
  Filled 2021-01-02: qty 30, 30d supply, fill #0
  Filled 2021-01-26: qty 30, 30d supply, fill #1
  Filled 2021-02-27: qty 30, 30d supply, fill #2
  Filled 2021-03-26: qty 30, 30d supply, fill #3
  Filled 2021-04-20: qty 30, 30d supply, fill #4
  Filled 2021-05-17: qty 30, 30d supply, fill #5
  Filled 2021-06-14: qty 30, 30d supply, fill #6
  Filled 2021-07-10: qty 30, 30d supply, fill #7
  Filled 2021-08-06: qty 30, 30d supply, fill #8
  Filled 2021-08-30: qty 30, 30d supply, fill #9
  Filled 2021-09-24: qty 30, 30d supply, fill #10
  Filled 2021-10-25: qty 30, 30d supply, fill #11

## 2021-01-02 MED ORDER — BENAZEPRIL HCL 40 MG PO TABS
ORAL_TABLET | ORAL | 11 refills | Status: DC
  Filled 2021-01-02: qty 30, 30d supply, fill #0

## 2021-01-02 MED ORDER — AMLODIPINE BESYLATE 10 MG PO TABS
ORAL_TABLET | ORAL | 11 refills | Status: DC
  Filled 2021-01-02: qty 30, 30d supply, fill #0
  Filled 2021-01-26: qty 30, 30d supply, fill #1
  Filled 2021-02-27: qty 30, 30d supply, fill #2
  Filled 2021-03-26: qty 30, 30d supply, fill #3
  Filled 2021-04-20: qty 30, 30d supply, fill #4
  Filled 2021-05-17: qty 30, 30d supply, fill #5
  Filled 2021-06-14: qty 30, 30d supply, fill #6
  Filled 2021-07-10: qty 30, 30d supply, fill #7
  Filled 2021-08-06: qty 30, 30d supply, fill #8
  Filled 2021-08-30: qty 30, 30d supply, fill #9
  Filled 2021-09-24: qty 30, 30d supply, fill #10
  Filled 2021-10-25: qty 30, 30d supply, fill #11

## 2021-01-02 MED ORDER — ROSUVASTATIN CALCIUM 5 MG PO TABS
ORAL_TABLET | ORAL | 11 refills | Status: DC
  Filled 2021-01-02: qty 30, 30d supply, fill #0

## 2021-01-02 NOTE — Progress Notes (Signed)
Virtual Visit via Telephone Note  I connected with [name] on 01/02/21 at  9:00 AM EST by telephone and verified that I am speaking with the correct person using two identifiers.  Location: Patient: at home Provider: at clinic   I discussed the limitations, risks, security and privacy concerns of performing an evaluation and management service by telephone and the availability of in person appointments. I also discussed with the patient that there may be a patient responsible charge related to this service. The patient expressed understanding and agreed to proceed.   History of Present Illness: Received 1st booster, on nov 1st and also has had flu vaccine. She is doing well overall. Has not had any covid exposure. Continues to tolerate her medications. Takes biktarvy daily without missing doses.   No health complaints  ROS: 12 point ROS is negative   Observations/Objective: Fluent speech Weight stable -161lb. Assessment and Plan: HIV disease = labs reveal she is well controlled. We will Refill biktarvy HTN = will refill Bp meds  Peripheral neuropathy - from chemotherapy - Refill gabapentin- for neuropathy HLD= looks under good control, plan to get Refill rosuvastatin Health maintenance = Next booster between jan-feb   Follow Up Instructions:    I discussed the assessment and treatment plan with the patient. The patient was provided an opportunity to ask questions and all were answered. The patient agreed with the plan and demonstrated an understanding of the instructions.   The patient was advised to call back or seek an in-person evaluation if the symptoms worsen or if the condition fails to improve as anticipated.  I provided 10 minutes of non-face-to-face time during this encounter.   Carlyle Basques, MD

## 2021-01-03 ENCOUNTER — Other Ambulatory Visit (HOSPITAL_COMMUNITY): Payer: Self-pay

## 2021-01-04 ENCOUNTER — Other Ambulatory Visit (HOSPITAL_COMMUNITY): Payer: Self-pay

## 2021-01-04 ENCOUNTER — Other Ambulatory Visit: Payer: Self-pay

## 2021-01-04 DIAGNOSIS — E7800 Pure hypercholesterolemia, unspecified: Secondary | ICD-10-CM

## 2021-01-04 DIAGNOSIS — I10 Essential (primary) hypertension: Secondary | ICD-10-CM

## 2021-01-04 MED ORDER — BENAZEPRIL HCL 40 MG PO TABS
ORAL_TABLET | ORAL | 3 refills | Status: DC
  Filled 2021-01-04: qty 90, fill #0
  Filled 2021-01-25: qty 90, 90d supply, fill #0
  Filled 2021-04-20: qty 90, 90d supply, fill #1
  Filled 2021-07-10: qty 90, 90d supply, fill #2
  Filled 2021-09-24: qty 90, 90d supply, fill #3

## 2021-01-04 MED ORDER — ROSUVASTATIN CALCIUM 5 MG PO TABS
ORAL_TABLET | ORAL | 3 refills | Status: DC
  Filled 2021-01-04: qty 90, fill #0
  Filled 2021-01-25: qty 90, 90d supply, fill #0
  Filled 2021-04-20: qty 90, 90d supply, fill #1
  Filled 2021-07-10: qty 90, 90d supply, fill #2
  Filled 2021-09-24: qty 90, 90d supply, fill #3

## 2021-01-04 NOTE — Telephone Encounter (Signed)
Patient requesting 90 day supply of rosuvastatin and benazepril. Dr. Baxter Flattery sent in 1 year supply of 11 refills, will convert to 90 day supply with 3 refills.  Beryle Flock, RN

## 2021-01-08 ENCOUNTER — Other Ambulatory Visit (HOSPITAL_COMMUNITY): Payer: Self-pay

## 2021-01-25 ENCOUNTER — Other Ambulatory Visit (HOSPITAL_COMMUNITY): Payer: Self-pay

## 2021-01-26 ENCOUNTER — Other Ambulatory Visit (HOSPITAL_COMMUNITY): Payer: Self-pay

## 2021-02-01 ENCOUNTER — Other Ambulatory Visit: Payer: Self-pay | Admitting: Internal Medicine

## 2021-02-01 ENCOUNTER — Other Ambulatory Visit (HOSPITAL_COMMUNITY): Payer: Self-pay

## 2021-02-01 DIAGNOSIS — M62838 Other muscle spasm: Secondary | ICD-10-CM

## 2021-02-01 MED ORDER — CYCLOBENZAPRINE HCL 10 MG PO TABS
ORAL_TABLET | Freq: Every evening | ORAL | 3 refills | Status: DC | PRN
  Filled 2021-02-01: qty 30, 30d supply, fill #0
  Filled 2021-02-27: qty 30, 30d supply, fill #1
  Filled 2021-03-26: qty 30, 30d supply, fill #2
  Filled 2021-04-20: qty 30, 30d supply, fill #3

## 2021-02-01 NOTE — Telephone Encounter (Signed)
Okay to refill per Dr. Snider.   Paulino Cork D Naugle, RN  

## 2021-02-01 NOTE — Telephone Encounter (Signed)
Do you want to refill? 

## 2021-02-05 ENCOUNTER — Other Ambulatory Visit (HOSPITAL_COMMUNITY): Payer: Self-pay

## 2021-02-08 ENCOUNTER — Other Ambulatory Visit: Payer: Self-pay | Admitting: Adult Health

## 2021-02-08 DIAGNOSIS — Z1231 Encounter for screening mammogram for malignant neoplasm of breast: Secondary | ICD-10-CM

## 2021-02-19 ENCOUNTER — Telehealth: Payer: Self-pay

## 2021-02-19 NOTE — Telephone Encounter (Signed)
Patient called requesting an exemption letter for jury duty. Patient states she would like for the letter to stated that she is an HIV patient and immunocompromised. Patient does not feel she should be in a group setting due to COVID, RSV, and Flu. If letter is written patient would like it to be mailed.  [name] T Brooks Sailors

## 2021-02-19 NOTE — Telephone Encounter (Signed)
Patient called wanting to talk to Dr. Baxter Flattery or her nurse, She is requesting an exemption letter for [employer] duty but needs to talk to someone first so she can have it exactly done as requested. Best contact number [phone]

## 2021-02-23 ENCOUNTER — Telehealth: Payer: Self-pay

## 2021-02-23 NOTE — Telephone Encounter (Signed)
Patient called wanting to know the status on exemption letter for jury duty, best contact number is [phone]

## 2021-02-23 NOTE — Telephone Encounter (Signed)
Follow up message sent to Md. Will call patient with update.

## 2021-02-26 ENCOUNTER — Encounter: Payer: Self-pay | Admitting: Internal Medicine

## 2021-02-27 ENCOUNTER — Other Ambulatory Visit (HOSPITAL_COMMUNITY): Payer: Self-pay

## 2021-02-28 ENCOUNTER — Other Ambulatory Visit (HOSPITAL_COMMUNITY): Payer: Self-pay

## 2021-03-01 ENCOUNTER — Encounter: Payer: Self-pay | Admitting: Internal Medicine

## 2021-03-01 NOTE — Telephone Encounter (Signed)
Spoke with patient, notified her that letter has been sent to MyChart account.   Beryle Flock, RN

## 2021-03-01 NOTE — Telephone Encounter (Signed)
Patient returned call, now stating she needs her letter mailed to her. Staff verified address and placed in outgoing mail. [name]

## 2021-03-26 ENCOUNTER — Other Ambulatory Visit (HOSPITAL_COMMUNITY): Payer: Self-pay

## 2021-03-29 ENCOUNTER — Other Ambulatory Visit (HOSPITAL_COMMUNITY): Payer: Self-pay

## 2021-04-20 ENCOUNTER — Other Ambulatory Visit (HOSPITAL_COMMUNITY): Payer: Self-pay

## 2021-04-23 ENCOUNTER — Other Ambulatory Visit (HOSPITAL_COMMUNITY): Payer: Self-pay

## 2021-05-07 ENCOUNTER — Telehealth: Payer: Self-pay

## 2021-05-07 NOTE — Telephone Encounter (Signed)
Patient called wanting to know if she could get another COVID booster. She had [employer] Bivalent 11/2020. Will route to provider.  ? ?Beryle Flock, RN ? ?

## 2021-05-07 NOTE — Telephone Encounter (Signed)
Discussed with Janene Madeira, NP and per CDC guidelines, no recommendation for second Bivalent booster at this time. Relayed this to patient and discussed that guidelines are subject to change and that she may be eligible for a second booster later this year. Patient verbalized understanding and has no further questions.  ? ?Beryle Flock, RN ? ?

## 2021-05-15 ENCOUNTER — Other Ambulatory Visit (HOSPITAL_COMMUNITY): Payer: Self-pay

## 2021-05-17 ENCOUNTER — Other Ambulatory Visit: Payer: Self-pay | Admitting: Internal Medicine

## 2021-05-17 ENCOUNTER — Other Ambulatory Visit (HOSPITAL_COMMUNITY): Payer: Self-pay

## 2021-05-17 DIAGNOSIS — M62838 Other muscle spasm: Secondary | ICD-10-CM

## 2021-05-18 NOTE — Telephone Encounter (Signed)
Please advise if okay to refill. 

## 2021-05-23 ENCOUNTER — Other Ambulatory Visit (HOSPITAL_COMMUNITY): Payer: Self-pay

## 2021-05-23 ENCOUNTER — Other Ambulatory Visit: Payer: Self-pay | Admitting: Internal Medicine

## 2021-05-23 DIAGNOSIS — M62838 Other muscle spasm: Secondary | ICD-10-CM

## 2021-05-24 ENCOUNTER — Other Ambulatory Visit (HOSPITAL_COMMUNITY): Payer: Self-pay

## 2021-05-24 MED ORDER — CYCLOBENZAPRINE HCL 10 MG PO TABS
ORAL_TABLET | Freq: Every evening | ORAL | 3 refills | Status: DC | PRN
  Filled 2021-05-24: qty 30, 30d supply, fill #0
  Filled 2021-06-14: qty 30, 30d supply, fill #1
  Filled 2021-07-10: qty 30, 30d supply, fill #2
  Filled 2021-08-06: qty 30, 30d supply, fill #3

## 2021-05-24 NOTE — Telephone Encounter (Signed)
Okay to refill? 

## 2021-06-14 ENCOUNTER — Other Ambulatory Visit (HOSPITAL_COMMUNITY): Payer: Self-pay

## 2021-06-18 ENCOUNTER — Other Ambulatory Visit (HOSPITAL_COMMUNITY)
Admission: RE | Admit: 2021-06-18 | Discharge: 2021-06-18 | Disposition: A | Discharge status: Home / Self Care | Payer: Medicare Other | Source: Ambulatory Visit | Attending: Internal Medicine | Admitting: Internal Medicine

## 2021-06-18 ENCOUNTER — Other Ambulatory Visit: Payer: Medicare Other

## 2021-06-18 ENCOUNTER — Ambulatory Visit
Admission: RE | Admit: 2021-06-18 | Discharge: 2021-06-18 | Disposition: A | Discharge status: Home / Self Care | Payer: Medicare Other | Source: Ambulatory Visit | Attending: Adult Health | Admitting: Adult Health

## 2021-06-18 ENCOUNTER — Other Ambulatory Visit (HOSPITAL_COMMUNITY): Payer: Self-pay

## 2021-06-18 ENCOUNTER — Other Ambulatory Visit: Payer: Self-pay

## 2021-06-18 DIAGNOSIS — B20 Human immunodeficiency virus [HIV] disease: Secondary | ICD-10-CM

## 2021-06-18 DIAGNOSIS — Z113 Encounter for screening for infections with a predominantly sexual mode of transmission: Secondary | ICD-10-CM

## 2021-06-18 DIAGNOSIS — Z1231 Encounter for screening mammogram for malignant neoplasm of breast: Secondary | ICD-10-CM | POA: Diagnosis not present

## 2021-06-19 LAB — T-HELPER CELL (CD4) - (RCID CLINIC ONLY)
CD4 % Helper T Cell: 26 % — ABNORMAL LOW (ref 33–65)
CD4 T Cell Abs: 702 /uL (ref 400–1790)

## 2021-06-19 LAB — URINE CYTOLOGY ANCILLARY ONLY
Chlamydia: NEGATIVE
Comment: NEGATIVE
Comment: NORMAL
Neisseria Gonorrhea: NEGATIVE

## 2021-06-20 LAB — COMPLETE METABOLIC PANEL WITH GFR
AG Ratio: 1.9 (calc) (ref 1.0–2.5)
ALT: 25 U/L (ref 6–29)
AST: 27 U/L (ref 10–35)
Albumin: 5.1 g/dL (ref 3.6–5.1)
Alkaline phosphatase (APISO): 83 U/L (ref 37–153)
BUN: 15 mg/dL (ref 7–25)
CO2: 29 mmol/L (ref 20–32)
Calcium: 10.2 mg/dL (ref 8.6–10.4)
Chloride: 103 mmol/L (ref 98–110)
Creat: 0.92 mg/dL (ref 0.50–1.05)
Globulin: 2.7 g/dL (calc) (ref 1.9–3.7)
Glucose, Bld: 90 mg/dL (ref 65–99)
Potassium: 3.8 mmol/L (ref 3.5–5.3)
Sodium: 141 mmol/L (ref 135–146)
Total Bilirubin: 0.6 mg/dL (ref 0.2–1.2)
Total Protein: 7.8 g/dL (ref 6.1–8.1)
eGFR: 67 mL/min/{1.73_m2} (ref >=60)

## 2021-06-20 LAB — CBC WITH DIFFERENTIAL/PLATELET
Absolute Monocytes: 562 cells/uL (ref 200–950)
Basophils Absolute: 52 cells/uL (ref 0–200)
Basophils Relative: 0.7 %
Eosinophils Absolute: 148 cells/uL (ref 15–500)
Eosinophils Relative: 2 %
HCT: 45.9 % — ABNORMAL HIGH (ref 35.0–45.0)
Hemoglobin: 16.1 g/dL — ABNORMAL HIGH (ref 11.7–15.5)
Lymphs Abs: 2864 cells/uL (ref 850–3900)
MCH: 33.3 pg — ABNORMAL HIGH (ref 27.0–33.0)
MCHC: 35.1 g/dL (ref 32.0–36.0)
MCV: 95 fL (ref 80.0–100.0)
MPV: 10.2 fL (ref 7.5–12.5)
Monocytes Relative: 7.6 %
Neutro Abs: 3774 cells/uL (ref 1500–7800)
Neutrophils Relative %: 51 %
Platelets: 312 10*3/uL (ref 140–400)
RBC: 4.83 10*6/uL (ref 3.80–5.10)
RDW: 12.2 % (ref 11.0–15.0)
Total Lymphocyte: 38.7 %
WBC: 7.4 10*3/uL (ref 3.8–10.8)

## 2021-06-20 LAB — HIV-1 RNA QUANT-NO REFLEX-BLD
HIV 1 RNA Quant: NOT DETECTED copies/mL
HIV-1 RNA Quant, Log: NOT DETECTED Log copies/mL

## 2021-06-20 LAB — RPR: RPR Ser Ql: NONREACTIVE

## 2021-06-28 ENCOUNTER — Other Ambulatory Visit: Payer: Self-pay

## 2021-06-28 ENCOUNTER — Encounter: Payer: Self-pay | Admitting: Internal Medicine

## 2021-06-28 ENCOUNTER — Ambulatory Visit (INDEPENDENT_AMBULATORY_CARE_PROVIDER_SITE_OTHER): Payer: Medicare Other | Admitting: Internal Medicine

## 2021-06-28 VITALS — BP 144/85 | HR 85 | Resp 16 | Ht 65.0 in | Wt 158.0 lb

## 2021-06-28 DIAGNOSIS — B20 Human immunodeficiency virus [HIV] disease: Secondary | ICD-10-CM

## 2021-06-28 DIAGNOSIS — Z79899 Other long term (current) drug therapy: Secondary | ICD-10-CM

## 2021-06-28 DIAGNOSIS — I10 Essential (primary) hypertension: Secondary | ICD-10-CM | POA: Diagnosis not present

## 2021-06-28 NOTE — Progress Notes (Signed)
RFV: follow up for hiv disease  Patient ID: [name], female   DOB: 1950-05-20, 71 y.o.   MRN: [id number]  HPI  [name] is a 71yo F with well controlled hiv disease, and breast ca in remission. She has noticed her BP is elevated since finishing chemo and radiation. At home when she monitors her BP, it is at home -138-150 / 80s.. not feeling dizzy. On amlodipine 10/,benazepril 40, and metoprolol 12.'5mg'$ .   No other health issues. Has been health. Not had covid-19.  Outpatient Encounter Medications as of 06/28/2021  Medication Sig   acetaminophen (TYLENOL) 500 MG tablet Take 500 mg by mouth every 6 (six) hours as needed.   amLODipine (NORVASC) 10 MG tablet Take 1 tablet by mouth once daily.   aspirin EC 81 MG tablet Take 81 mg by mouth daily.   benazepril (LOTENSIN) 40 MG tablet Take 1 tablet by mouth once daily.   bictegravir-emtricitabine-tenofovir AF (BIKTARVY) 50-200-25 MG TABS tablet TAKE 1 TABLET BY MOUTH DAILY.   Biotin 5000 MCG CAPS Take 5,000 mcg daily by mouth.   Coenzyme Q10 300 MG CAPS Take 300 capsules by mouth daily.   cyclobenzaprine (FLEXERIL) 10 MG tablet TAKE 1 TABLET BY MOUTH AT BEDTIME IF NEEDED   fexofenadine (ALLEGRA) 180 MG tablet Take 180 mg daily by mouth.   gabapentin (NEURONTIN) 100 MG capsule Take 1 capsule by mouth twice daily.   metoprolol tartrate (LOPRESSOR) 25 MG tablet Take 1/2 tablet by mouth twice daily.   Multiple Vitamin (MULTIVITAMIN WITH MINERALS) TABS tablet Take 1 tablet by mouth daily.   Omega-3 Fatty Acids (FISH OIL) 1200 MG CAPS Take 1,200 mg by mouth 2 (two) times daily.   polyethylene glycol (MIRALAX / GLYCOLAX) packet Take 17 g by mouth daily.   rosuvastatin (CRESTOR) 5 MG tablet Take 1 tablet by mouth every night at bedtime.   Facility-Administered Encounter Medications as of 06/28/2021  Medication   sodium chloride flush (NS) 0.9 % injection 10 mL     Patient Active Problem List   Diagnosis Date Noted   Aortic atherosclerosis  ([place]) 10/07/2017   Oral thrush 06/26/2017   Dry mouth & dry eyes  06/26/2017   Muscle spasms of neck 11/25/2016   Drug-induced neutropenia ([place]) 04/23/2016   Malignant neoplasm of upper-outer quadrant of left breast in female, estrogen receptor negative ([place]) 12/11/2015   Hereditary and idiopathic peripheral neuropathy 07/16/2012   Lumbar neuritis 07/16/2012   Cervicalgia 07/16/2012   URI 12/02/2006   Human immunodeficiency virus (HIV) disease ([place]) 11/08/2005   Hyperlipidemia 11/08/2005   Essential hypertension 11/08/2005   BRONCHITIS 11/08/2005   Regional enteritis ([place]) 11/08/2005   SYMPTOM, PAINFUL RESPIRATION 11/08/2005     Health Maintenance Due  Topic Date Due   Zoster Vaccines- Shingrix (1 of 2) Never done   COLONOSCOPY (Pts 45-43yr Insurance coverage will need to be confirmed)  Never done   DEXA SCAN  Never done   Pneumonia Vaccine 70 Years old (37 05/28/2018   COVID-19 Vaccine (4 - Booster for [place]) 01/24/2021   Sochx: no drinking or smoking  Review of Systems Review of Systems  Constitutional: Negative for fever, chills, diaphoresis, activity change, appetite change, fatigue and unexpected weight change.  HENT: Negative for congestion, sore throat, rhinorrhea, sneezing, trouble swallowing and sinus pressure.  Eyes: Negative for photophobia and visual disturbance.  Respiratory: Negative for cough, chest tightness, shortness of breath, wheezing and stridor.  Cardiovascular: Negative for chest pain, palpitations and leg swelling.  Gastrointestinal: Negative for nausea, vomiting, abdominal pain, diarrhea, constipation, blood in stool, abdominal distention and anal bleeding.  Genitourinary: Negative for dysuria, hematuria, flank pain and difficulty urinating.  Musculoskeletal: Negative for myalgias, back pain, joint swelling, arthralgias and gait problem.  Skin: Negative for color change, pallor, rash and wound.  Neurological: Negative for dizziness, tremors,  weakness and light-headedness.  Hematological: Negative for adenopathy. Does not bruise/bleed easily.  Psychiatric/Behavioral: Negative for behavioral problems, confusion, sleep disturbance, dysphoric mood, decreased concentration and agitation.   Physical Exam   BP (!) 144/85   Pulse 85   Resp 16   Ht '5\' 5"'$  (1.651 m)   Wt 158 lb (71.7 kg)   SpO2 98%   BMI 26.29 kg/m   Physical Exam  Constitutional:  oriented to person, place, and time. appears well-developed and well-nourished. No distress.  HENT: [place]/AT, PERRLA, no scleral icterus Mouth/Throat: Oropharynx is clear and moist. No oropharyngeal exudate.  Cardiovascular: Normal rate, regular rhythm and normal heart sounds. Exam reveals no gallop and no friction rub.  No murmur heard.  Pulmonary/Chest: Effort normal and breath sounds normal. No respiratory distress.  has no wheezes.  Neck = supple, no nuchal rigidity Lymphadenopathy: no cervical adenopathy. No axillary adenopathy Neurological: alert and oriented to person, place, and time.  Skin: Skin is warm and dry. No rash noted. No erythema.  Psychiatric: a normal mood and affect.  behavior is normal.   Lab Results  Component Value Date   CD4TCELL 26 (L) 06/18/2021   Lab Results  Component Value Date   CD4TABS 702 06/18/2021   CD4TABS 644 12/18/2020   CD4TABS 700 05/30/2020   Lab Results  Component Value Date   HIV1RNAQUANT NOT DETECTED 06/18/2021   Lab Results  Component Value Date   HEPBSAB No 03/24/2006   Lab Results  Component Value Date   LABRPR NON-REACTIVE 06/18/2021    CBC Lab Results  Component Value Date   WBC 7.4 06/18/2021   RBC 4.83 06/18/2021   HGB 16.1 (H) 06/18/2021   HCT 45.9 (H) 06/18/2021   PLT 312 06/18/2021   MCV 95.0 06/18/2021   MCH 33.3 (H) 06/18/2021   MCHC 35.1 06/18/2021   RDW 12.2 06/18/2021   LYMPHSABS 2,864 06/18/2021   MONOABS 0.7 02/01/2019   EOSABS 148 06/18/2021    BMET Lab Results  Component Value Date   NA 141  06/18/2021   K 3.8 06/18/2021   CL 103 06/18/2021   CO2 29 06/18/2021   GLUCOSE 90 06/18/2021   BUN 15 06/18/2021   CREATININE 0.92 06/18/2021   CALCIUM 10.2 06/18/2021   GFRNONAA 67 09/06/2019   GFRAA 78 09/06/2019      Assessment and Plan  Hiv disease = started tx in 1992. Doing well.labs reviewed with patient and well controlled.  Long term medication management/toxicity = kidney function is stable  Htn =not at goal. will increase metop '25mg'$  daily up from 12.'5mg'$ . keep other meds at current doses  Health maintenance = wants covid booster for both herself and her son. Will arrange appointment for next week  Hx of polyp= going every 5 yrs. For colonoscopy. Not due this year

## 2021-07-02 ENCOUNTER — Ambulatory Visit (INDEPENDENT_AMBULATORY_CARE_PROVIDER_SITE_OTHER): Payer: Medicare Other

## 2021-07-02 ENCOUNTER — Other Ambulatory Visit: Payer: Self-pay

## 2021-07-02 DIAGNOSIS — Z23 Encounter for immunization: Secondary | ICD-10-CM | POA: Diagnosis not present

## 2021-07-03 ENCOUNTER — Ambulatory Visit: Payer: Medicare Other | Admitting: Internal Medicine

## 2021-07-10 ENCOUNTER — Other Ambulatory Visit (HOSPITAL_COMMUNITY): Payer: Self-pay

## 2021-07-13 ENCOUNTER — Other Ambulatory Visit (HOSPITAL_COMMUNITY): Payer: Self-pay

## 2021-08-06 ENCOUNTER — Other Ambulatory Visit (HOSPITAL_COMMUNITY): Payer: Self-pay

## 2021-08-09 ENCOUNTER — Other Ambulatory Visit (HOSPITAL_COMMUNITY): Payer: Self-pay

## 2021-08-30 ENCOUNTER — Other Ambulatory Visit (HOSPITAL_COMMUNITY): Payer: Self-pay

## 2021-08-30 ENCOUNTER — Other Ambulatory Visit: Payer: Self-pay | Admitting: Internal Medicine

## 2021-08-30 DIAGNOSIS — M62838 Other muscle spasm: Secondary | ICD-10-CM

## 2021-08-30 NOTE — Telephone Encounter (Signed)
Pls advise on refill request

## 2021-09-01 ENCOUNTER — Other Ambulatory Visit (HOSPITAL_COMMUNITY): Payer: Self-pay

## 2021-09-14 ENCOUNTER — Other Ambulatory Visit (HOSPITAL_COMMUNITY): Payer: Self-pay

## 2021-09-14 ENCOUNTER — Other Ambulatory Visit: Payer: Self-pay | Admitting: Internal Medicine

## 2021-09-14 DIAGNOSIS — M62838 Other muscle spasm: Secondary | ICD-10-CM

## 2021-09-14 NOTE — Telephone Encounter (Signed)
Patient requesting refill. 

## 2021-09-17 ENCOUNTER — Other Ambulatory Visit (HOSPITAL_COMMUNITY): Payer: Self-pay

## 2021-09-17 MED ORDER — CYCLOBENZAPRINE HCL 10 MG PO TABS
ORAL_TABLET | Freq: Every evening | ORAL | 3 refills | Status: DC | PRN
  Filled 2021-09-17: qty 30, 30d supply, fill #0
  Filled 2021-10-25: qty 30, 30d supply, fill #1
  Filled 2021-11-20: qty 30, 30d supply, fill #2
  Filled 2021-12-13: qty 30, 30d supply, fill #3

## 2021-09-24 ENCOUNTER — Other Ambulatory Visit (HOSPITAL_COMMUNITY): Payer: Self-pay

## 2021-10-06 ENCOUNTER — Other Ambulatory Visit (HOSPITAL_COMMUNITY): Payer: Self-pay

## 2021-10-23 ENCOUNTER — Telehealth: Payer: Self-pay

## 2021-10-23 ENCOUNTER — Other Ambulatory Visit (HOSPITAL_COMMUNITY): Payer: Self-pay

## 2021-10-23 NOTE — Telephone Encounter (Signed)
[facility] Patient Advocate Encounter   I was successful in securing patient a $7500.00 grant from [employer] (PAF) to provide copayment coverage for Biktarvy.  This will make the out of pocket cost $0.00.     I have spoken with the patient.        Patient knows to call the office with questions or concerns.  Ileene Patrick, [facility] Patient [facility] Phone: [phone] Fax:  [phone]

## 2021-10-25 ENCOUNTER — Other Ambulatory Visit (HOSPITAL_COMMUNITY): Payer: Self-pay

## 2021-10-29 ENCOUNTER — Other Ambulatory Visit (HOSPITAL_COMMUNITY): Payer: Self-pay

## 2021-11-20 ENCOUNTER — Other Ambulatory Visit: Payer: Self-pay | Admitting: Internal Medicine

## 2021-11-20 ENCOUNTER — Other Ambulatory Visit (HOSPITAL_COMMUNITY): Payer: Self-pay

## 2021-11-20 DIAGNOSIS — I10 Essential (primary) hypertension: Secondary | ICD-10-CM

## 2021-11-20 DIAGNOSIS — B20 Human immunodeficiency virus [HIV] disease: Secondary | ICD-10-CM

## 2021-11-21 ENCOUNTER — Other Ambulatory Visit (HOSPITAL_COMMUNITY): Payer: Self-pay

## 2021-11-22 ENCOUNTER — Other Ambulatory Visit (HOSPITAL_COMMUNITY): Payer: Self-pay

## 2021-11-22 MED ORDER — BIKTARVY 50-200-25 MG PO TABS
1.0000 | ORAL_TABLET | Freq: Every day | ORAL | 1 refills | Status: DC
  Filled 2021-11-22: qty 30, 30d supply, fill #0
  Filled 2021-12-13: qty 30, 30d supply, fill #1

## 2021-11-22 MED ORDER — AMLODIPINE BESYLATE 10 MG PO TABS
10.0000 mg | ORAL_TABLET | Freq: Every day | ORAL | 6 refills | Status: DC
  Filled 2021-11-22 (×2): qty 30, 30d supply, fill #0
  Filled 2021-12-13: qty 30, 30d supply, fill #1
  Filled 2022-02-12: qty 30, 30d supply, fill #2
  Filled 2022-03-06: qty 30, 30d supply, fill #3
  Filled 2022-04-02: qty 30, 30d supply, fill #4
  Filled 2022-04-26: qty 30, 30d supply, fill #5
  Filled 2022-05-23: qty 30, 30d supply, fill #6

## 2021-11-22 MED ORDER — METOPROLOL TARTRATE 25 MG PO TABS
12.5000 mg | ORAL_TABLET | Freq: Every day | ORAL | 6 refills | Status: DC
  Filled 2021-11-22: qty 30, 60d supply, fill #0
  Filled 2021-12-13: qty 30, 60d supply, fill #1

## 2021-11-23 ENCOUNTER — Other Ambulatory Visit (HOSPITAL_COMMUNITY): Payer: Self-pay

## 2021-12-13 ENCOUNTER — Other Ambulatory Visit (HOSPITAL_COMMUNITY): Payer: Self-pay

## 2021-12-13 ENCOUNTER — Other Ambulatory Visit: Payer: Self-pay | Admitting: Internal Medicine

## 2021-12-13 DIAGNOSIS — E7800 Pure hypercholesterolemia, unspecified: Secondary | ICD-10-CM

## 2021-12-13 DIAGNOSIS — I10 Essential (primary) hypertension: Secondary | ICD-10-CM

## 2021-12-13 DIAGNOSIS — T451X5A Adverse effect of antineoplastic and immunosuppressive drugs, initial encounter: Secondary | ICD-10-CM

## 2021-12-13 MED ORDER — GABAPENTIN 100 MG PO CAPS
100.0000 mg | ORAL_CAPSULE | Freq: Two times a day (BID) | ORAL | 11 refills | Status: DC
  Filled 2021-12-13: qty 60, fill #0
  Filled 2022-02-12: qty 60, 30d supply, fill #0
  Filled 2022-03-06: qty 60, 30d supply, fill #1
  Filled 2022-04-02: qty 60, 30d supply, fill #2
  Filled 2022-04-26: qty 60, 30d supply, fill #3
  Filled 2022-05-23: qty 60, 30d supply, fill #4
  Filled 2022-06-27: qty 60, 30d supply, fill #5
  Filled 2022-08-20 – 2022-08-21 (×3): qty 60, 30d supply, fill #6
  Filled 2022-10-10: qty 60, 30d supply, fill #7
  Filled 2022-11-11: qty 60, 30d supply, fill #8
  Filled 2022-12-02: qty 60, 30d supply, fill #9

## 2021-12-13 MED ORDER — ROSUVASTATIN CALCIUM 5 MG PO TABS
5.0000 mg | ORAL_TABLET | Freq: Every day | ORAL | 3 refills | Status: DC
  Filled 2021-12-13: qty 90, 90d supply, fill #0
  Filled 2022-03-06: qty 90, 90d supply, fill #1
  Filled 2022-05-23: qty 90, 90d supply, fill #2

## 2021-12-13 MED ORDER — BENAZEPRIL HCL 40 MG PO TABS
40.0000 mg | ORAL_TABLET | Freq: Every day | ORAL | 3 refills | Status: DC
  Filled 2021-12-13: qty 90, 90d supply, fill #0
  Filled 2022-03-06: qty 90, 90d supply, fill #1
  Filled 2022-05-23: qty 90, 90d supply, fill #2
  Filled 2022-08-20 – 2022-08-21 (×3): qty 90, 90d supply, fill #3

## 2021-12-13 NOTE — Telephone Encounter (Signed)
Awaiting response from Dr. Baxter Flattery for approval.   Beryle Flock, RN

## 2021-12-24 ENCOUNTER — Other Ambulatory Visit (HOSPITAL_COMMUNITY): Payer: Self-pay

## 2021-12-27 ENCOUNTER — Other Ambulatory Visit: Payer: Self-pay

## 2021-12-27 DIAGNOSIS — Z113 Encounter for screening for infections with a predominantly sexual mode of transmission: Secondary | ICD-10-CM

## 2021-12-27 DIAGNOSIS — B20 Human immunodeficiency virus [HIV] disease: Secondary | ICD-10-CM

## 2021-12-27 DIAGNOSIS — Z79899 Other long term (current) drug therapy: Secondary | ICD-10-CM

## 2021-12-31 ENCOUNTER — Other Ambulatory Visit: Payer: Self-pay

## 2021-12-31 ENCOUNTER — Other Ambulatory Visit: Payer: Medicare Other

## 2021-12-31 ENCOUNTER — Other Ambulatory Visit (HOSPITAL_COMMUNITY)
Admission: RE | Admit: 2021-12-31 | Discharge: 2021-12-31 | Disposition: A | Discharge status: Home / Self Care | Payer: Medicare Other | Source: Ambulatory Visit | Attending: Internal Medicine | Admitting: Internal Medicine

## 2021-12-31 DIAGNOSIS — Z113 Encounter for screening for infections with a predominantly sexual mode of transmission: Secondary | ICD-10-CM | POA: Insufficient documentation

## 2021-12-31 DIAGNOSIS — Z79899 Other long term (current) drug therapy: Secondary | ICD-10-CM

## 2021-12-31 DIAGNOSIS — B20 Human immunodeficiency virus [HIV] disease: Secondary | ICD-10-CM | POA: Diagnosis not present

## 2022-01-01 LAB — URINE CYTOLOGY ANCILLARY ONLY
Chlamydia: NEGATIVE
Comment: NEGATIVE
Comment: NORMAL
Neisseria Gonorrhea: NEGATIVE

## 2022-01-01 LAB — T-HELPER CELL (CD4) - (RCID CLINIC ONLY)
CD4 % Helper T Cell: 25 % — ABNORMAL LOW (ref 33–65)
CD4 T Cell Abs: 556 /uL (ref 400–1790)

## 2022-01-02 LAB — CBC WITH DIFFERENTIAL/PLATELET
Absolute Monocytes: 598 cells/uL (ref 200–950)
Basophils Absolute: 58 cells/uL (ref 0–200)
Basophils Relative: 0.8 %
Eosinophils Absolute: 180 cells/uL (ref 15–500)
Eosinophils Relative: 2.5 %
HCT: 44.3 % (ref 35.0–45.0)
Hemoglobin: 15.5 g/dL (ref 11.7–15.5)
Lymphs Abs: 2419 cells/uL (ref 850–3900)
MCH: 32.9 pg (ref 27.0–33.0)
MCHC: 35 g/dL (ref 32.0–36.0)
MCV: 94.1 fL (ref 80.0–100.0)
MPV: 10.1 fL (ref 7.5–12.5)
Monocytes Relative: 8.3 %
Neutro Abs: 3946 cells/uL (ref 1500–7800)
Neutrophils Relative %: 54.8 %
Platelets: 282 10*3/uL (ref 140–400)
RBC: 4.71 10*6/uL (ref 3.80–5.10)
RDW: 12.1 % (ref 11.0–15.0)
Total Lymphocyte: 33.6 %
WBC: 7.2 10*3/uL (ref 3.8–10.8)

## 2022-01-02 LAB — COMPLETE METABOLIC PANEL WITH GFR
AG Ratio: 1.8 (calc) (ref 1.0–2.5)
ALT: 26 U/L (ref 6–29)
AST: 29 U/L (ref 10–35)
Albumin: 4.9 g/dL (ref 3.6–5.1)
Alkaline phosphatase (APISO): 78 U/L (ref 37–153)
BUN: 12 mg/dL (ref 7–25)
CO2: 30 mmol/L (ref 20–32)
Calcium: 10 mg/dL (ref 8.6–10.4)
Chloride: 103 mmol/L (ref 98–110)
Creat: 0.82 mg/dL (ref 0.60–1.00)
Globulin: 2.8 g/dL (calc) (ref 1.9–3.7)
Glucose, Bld: 87 mg/dL (ref 65–99)
Potassium: 3.6 mmol/L (ref 3.5–5.3)
Sodium: 143 mmol/L (ref 135–146)
Total Bilirubin: 0.5 mg/dL (ref 0.2–1.2)
Total Protein: 7.7 g/dL (ref 6.1–8.1)
eGFR: 77 mL/min/{1.73_m2} (ref >=60)

## 2022-01-02 LAB — RPR: RPR Ser Ql: NONREACTIVE

## 2022-01-02 LAB — LIPID PANEL
Cholesterol: 190 mg/dL (ref <200)
HDL: 47 mg/dL — ABNORMAL LOW (ref >=50)
LDL Cholesterol (Calc): 115 mg/dL (calc) — ABNORMAL HIGH
Non-HDL Cholesterol (Calc): 143 mg/dL (calc) — ABNORMAL HIGH (ref <130)
Total CHOL/HDL Ratio: 4 (calc) (ref <5.0)
Triglycerides: 165 mg/dL — ABNORMAL HIGH (ref <150)

## 2022-01-02 LAB — HIV-1 RNA QUANT-NO REFLEX-BLD
HIV 1 RNA Quant: NOT DETECTED Copies/mL
HIV-1 RNA Quant, Log: NOT DETECTED Log cps/mL

## 2022-01-07 ENCOUNTER — Other Ambulatory Visit (HOSPITAL_COMMUNITY): Payer: Self-pay

## 2022-01-07 ENCOUNTER — Other Ambulatory Visit: Payer: Self-pay

## 2022-01-07 MED ORDER — METOPROLOL TARTRATE 25 MG PO TABS
12.5000 mg | ORAL_TABLET | ORAL | 5 refills | Status: DC
  Filled 2022-01-07: qty 30, 30d supply, fill #0
  Filled 2022-02-12: qty 30, 30d supply, fill #1
  Filled 2022-03-06: qty 30, 30d supply, fill #2
  Filled 2022-04-02: qty 30, 30d supply, fill #3
  Filled 2022-04-26: qty 30, 30d supply, fill #4
  Filled 2022-05-23: qty 30, 30d supply, fill #5

## 2022-01-14 ENCOUNTER — Ambulatory Visit: Payer: Medicare Other | Admitting: Internal Medicine

## 2022-01-15 ENCOUNTER — Other Ambulatory Visit (HOSPITAL_COMMUNITY): Payer: Self-pay

## 2022-01-15 ENCOUNTER — Other Ambulatory Visit: Payer: Self-pay | Admitting: Internal Medicine

## 2022-01-15 DIAGNOSIS — B20 Human immunodeficiency virus [HIV] disease: Secondary | ICD-10-CM

## 2022-01-15 NOTE — Telephone Encounter (Signed)
Pending. Upcoming appt. 12/28

## 2022-01-16 ENCOUNTER — Other Ambulatory Visit: Payer: Self-pay

## 2022-01-16 ENCOUNTER — Other Ambulatory Visit (HOSPITAL_COMMUNITY): Payer: Self-pay

## 2022-01-16 MED ORDER — BIKTARVY 50-200-25 MG PO TABS
1.0000 | ORAL_TABLET | Freq: Every day | ORAL | 0 refills | Status: DC
  Filled 2022-01-16: qty 30, 30d supply, fill #0

## 2022-01-24 ENCOUNTER — Telehealth: Payer: Medicare Other | Admitting: Internal Medicine

## 2022-01-31 ENCOUNTER — Ambulatory Visit (INDEPENDENT_AMBULATORY_CARE_PROVIDER_SITE_OTHER): Payer: Medicare Other | Admitting: Internal Medicine

## 2022-01-31 ENCOUNTER — Encounter: Payer: Self-pay | Admitting: Adult Health

## 2022-01-31 ENCOUNTER — Other Ambulatory Visit: Payer: Self-pay

## 2022-01-31 ENCOUNTER — Encounter: Payer: Self-pay | Admitting: Internal Medicine

## 2022-01-31 ENCOUNTER — Other Ambulatory Visit (HOSPITAL_COMMUNITY): Payer: Self-pay

## 2022-01-31 VITALS — Ht 65.0 in | Wt 158.0 lb

## 2022-01-31 DIAGNOSIS — B20 Human immunodeficiency virus [HIV] disease: Secondary | ICD-10-CM

## 2022-01-31 DIAGNOSIS — R002 Palpitations: Secondary | ICD-10-CM

## 2022-01-31 DIAGNOSIS — Z79899 Other long term (current) drug therapy: Secondary | ICD-10-CM | POA: Diagnosis not present

## 2022-01-31 DIAGNOSIS — E782 Mixed hyperlipidemia: Secondary | ICD-10-CM

## 2022-01-31 MED ORDER — BIKTARVY 50-200-25 MG PO TABS
1.0000 | ORAL_TABLET | Freq: Every day | ORAL | 11 refills | Status: DC
  Filled 2022-01-31 – 2022-02-12 (×2): qty 30, 30d supply, fill #0
  Filled 2022-03-06: qty 30, 30d supply, fill #1
  Filled 2022-04-02: qty 30, 30d supply, fill #2
  Filled 2022-04-26 – 2022-05-01 (×2): qty 30, 30d supply, fill #3
  Filled 2022-05-23: qty 30, 30d supply, fill #4
  Filled 2022-06-27: qty 30, 30d supply, fill #5
  Filled 2022-07-24: qty 30, 30d supply, fill #6

## 2022-01-31 NOTE — Progress Notes (Signed)
Virtual Visit via Telephone Note  I connected with Miia Blanks Sundell on 01/31/22 at  9:00 AM EST by telephone and verified that I am speaking with the correct person using two identifiers.  Location: Patient: at home Provider: in clinic   I discussed the limitations, risks, security and privacy concerns of performing an evaluation and management service by telephone and the availability of in person appointments. I also discussed with the patient that there may be a patient responsible charge related to this service. The patient expressed understanding and agreed to proceed.   History of Present Illness: 72yo F with hiv disease, cd 4 count 556/VL<20  Health maintenance = update date on vaccines she had flu and covid vaccine at end of October.  Long term medication management =reviewed lab results. And cr stable. Continue on current regimen  Breast cancer free for 6 years now'  No longer having palpitations /tachycardia -- still 1/2 tab of metoprolol bid  Observations/Objective:  Fluent speech does not appear in any distress Assessment and Plan: Continue on rosuvastatin '5mg'$  daily for HLD Will refill hiv meds well controlled Palpitations = resolved, continue on metop 1/2 tab bid  Follow Up Instructions: Return in June in person visit   I discussed the assessment and treatment plan with the patient. The patient was provided an opportunity to ask questions and all were answered. The patient agreed with the plan and demonstrated an understanding of the instructions.   The patient was advised to call back or seek an in-person evaluation if the symptoms worsen or if the condition fails to improve as anticipated.  I provided 12 minutes of non-face-to-face time during this encounter.   Carlyle Basques, MD

## 2022-02-12 ENCOUNTER — Other Ambulatory Visit (HOSPITAL_COMMUNITY): Payer: Self-pay

## 2022-02-12 ENCOUNTER — Encounter: Payer: Self-pay | Admitting: Adult Health

## 2022-02-12 ENCOUNTER — Other Ambulatory Visit: Payer: Self-pay | Admitting: Internal Medicine

## 2022-02-12 ENCOUNTER — Other Ambulatory Visit: Payer: Self-pay

## 2022-02-12 DIAGNOSIS — M62838 Other muscle spasm: Secondary | ICD-10-CM

## 2022-02-12 MED ORDER — CYCLOBENZAPRINE HCL 10 MG PO TABS
10.0000 mg | ORAL_TABLET | Freq: Every evening | ORAL | 3 refills | Status: DC | PRN
  Filled 2022-02-12: qty 30, 30d supply, fill #0
  Filled 2022-03-06: qty 30, 30d supply, fill #1
  Filled 2022-04-02: qty 30, 30d supply, fill #2
  Filled 2022-04-26: qty 30, 30d supply, fill #3

## 2022-02-12 NOTE — Telephone Encounter (Signed)
Please advise on lab results.

## 2022-02-15 ENCOUNTER — Other Ambulatory Visit: Payer: Self-pay | Admitting: Adult Health

## 2022-02-15 DIAGNOSIS — Z1231 Encounter for screening mammogram for malignant neoplasm of breast: Secondary | ICD-10-CM

## 2022-03-06 ENCOUNTER — Other Ambulatory Visit (HOSPITAL_COMMUNITY): Payer: Self-pay

## 2022-03-08 ENCOUNTER — Other Ambulatory Visit (HOSPITAL_COMMUNITY): Payer: Self-pay

## 2022-04-02 ENCOUNTER — Other Ambulatory Visit (HOSPITAL_COMMUNITY): Payer: Self-pay

## 2022-04-05 ENCOUNTER — Other Ambulatory Visit (HOSPITAL_COMMUNITY): Payer: Self-pay

## 2022-04-26 ENCOUNTER — Other Ambulatory Visit (HOSPITAL_COMMUNITY): Payer: Self-pay

## 2022-04-26 ENCOUNTER — Other Ambulatory Visit: Payer: Self-pay

## 2022-04-29 ENCOUNTER — Other Ambulatory Visit (HOSPITAL_COMMUNITY): Payer: Self-pay

## 2022-05-01 ENCOUNTER — Other Ambulatory Visit (HOSPITAL_COMMUNITY): Payer: Self-pay

## 2022-05-23 ENCOUNTER — Other Ambulatory Visit (HOSPITAL_COMMUNITY): Payer: Self-pay

## 2022-05-23 ENCOUNTER — Other Ambulatory Visit: Payer: Self-pay | Admitting: Internal Medicine

## 2022-05-23 DIAGNOSIS — M62838 Other muscle spasm: Secondary | ICD-10-CM

## 2022-05-24 ENCOUNTER — Other Ambulatory Visit (HOSPITAL_COMMUNITY): Payer: Self-pay

## 2022-05-24 ENCOUNTER — Other Ambulatory Visit: Payer: Self-pay

## 2022-05-24 MED ORDER — CYCLOBENZAPRINE HCL 10 MG PO TABS
10.0000 mg | ORAL_TABLET | Freq: Every evening | ORAL | 1 refills | Status: DC | PRN
  Filled 2022-05-30: qty 30, 30d supply, fill #0
  Filled 2022-06-27: qty 30, 30d supply, fill #1

## 2022-05-30 ENCOUNTER — Other Ambulatory Visit (HOSPITAL_COMMUNITY): Payer: Self-pay

## 2022-06-27 ENCOUNTER — Other Ambulatory Visit: Payer: Self-pay | Admitting: Internal Medicine

## 2022-06-27 ENCOUNTER — Other Ambulatory Visit (HOSPITAL_COMMUNITY): Payer: Self-pay

## 2022-06-27 DIAGNOSIS — I10 Essential (primary) hypertension: Secondary | ICD-10-CM

## 2022-06-28 ENCOUNTER — Other Ambulatory Visit: Payer: Self-pay

## 2022-06-28 ENCOUNTER — Other Ambulatory Visit (HOSPITAL_COMMUNITY): Payer: Self-pay

## 2022-06-28 MED ORDER — METOPROLOL TARTRATE 25 MG PO TABS
12.5000 mg | ORAL_TABLET | Freq: Two times a day (BID) | ORAL | 2 refills | Status: DC
  Filled 2022-06-28: qty 30, 30d supply, fill #0
  Filled 2022-07-24: qty 30, 30d supply, fill #1
  Filled 2022-08-20 – 2022-08-21 (×3): qty 30, 30d supply, fill #2

## 2022-06-28 MED ORDER — AMLODIPINE BESYLATE 10 MG PO TABS
10.0000 mg | ORAL_TABLET | Freq: Every day | ORAL | 2 refills | Status: DC
  Filled 2022-06-28: qty 30, 30d supply, fill #0
  Filled 2022-07-24: qty 30, 30d supply, fill #1
  Filled 2022-08-20 – 2022-08-21 (×3): qty 30, 30d supply, fill #2

## 2022-07-01 ENCOUNTER — Other Ambulatory Visit: Payer: Self-pay

## 2022-07-01 ENCOUNTER — Ambulatory Visit
Admission: RE | Admit: 2022-07-01 | Discharge: 2022-07-01 | Disposition: A | Discharge status: Home / Self Care | Payer: Medicare Other | Source: Ambulatory Visit | Attending: Adult Health | Admitting: Adult Health

## 2022-07-01 ENCOUNTER — Other Ambulatory Visit (HOSPITAL_COMMUNITY): Payer: Self-pay

## 2022-07-01 ENCOUNTER — Other Ambulatory Visit: Payer: Medicare Other

## 2022-07-01 DIAGNOSIS — Z113 Encounter for screening for infections with a predominantly sexual mode of transmission: Secondary | ICD-10-CM

## 2022-07-01 DIAGNOSIS — B20 Human immunodeficiency virus [HIV] disease: Secondary | ICD-10-CM

## 2022-07-01 DIAGNOSIS — Z79899 Other long term (current) drug therapy: Secondary | ICD-10-CM

## 2022-07-01 DIAGNOSIS — Z1231 Encounter for screening mammogram for malignant neoplasm of breast: Secondary | ICD-10-CM | POA: Diagnosis not present

## 2022-07-01 LAB — CBC WITH DIFFERENTIAL/PLATELET
HCT: 46.2 % — ABNORMAL HIGH (ref 35.0–45.0)
Hemoglobin: 15.9 g/dL — ABNORMAL HIGH (ref 11.7–15.5)
Lymphs Abs: 2826 cells/uL (ref 850–3900)
MCV: 92.6 fL (ref 80.0–100.0)
MPV: 10.1 fL (ref 7.5–12.5)
RBC: 4.99 10*6/uL (ref 3.80–5.10)
WBC: 7.7 10*3/uL (ref 3.8–10.8)

## 2022-07-02 LAB — C. TRACHOMATIS/N. GONORRHOEAE RNA
C. trachomatis RNA, TMA: NOT DETECTED
N. gonorrhoeae RNA, TMA: NOT DETECTED

## 2022-07-02 LAB — COMPLETE METABOLIC PANEL WITH GFR
AG Ratio: 2 (calc) (ref 1.0–2.5)
AST: 29 U/L (ref 10–35)
Sodium: 143 mmol/L (ref 135–146)
Total Protein: 7.7 g/dL (ref 6.1–8.1)

## 2022-07-02 LAB — T-HELPER CELLS (CD4) COUNT (NOT AT ARMC): Total lymphocyte count: 2867 cells/uL (ref 850–3900)

## 2022-07-05 LAB — LIPID PANEL
Cholesterol: 185 mg/dL (ref <200)
HDL: 49 mg/dL — ABNORMAL LOW (ref >=50)
LDL Cholesterol (Calc): 111 mg/dL (calc) — ABNORMAL HIGH
Non-HDL Cholesterol (Calc): 136 mg/dL (calc) — ABNORMAL HIGH (ref <130)
Total CHOL/HDL Ratio: 3.8 (calc) (ref <5.0)
Triglycerides: 136 mg/dL (ref <150)

## 2022-07-05 LAB — COMPLETE METABOLIC PANEL WITH GFR
ALT: 26 U/L (ref 6–29)
Albumin: 5.1 g/dL (ref 3.6–5.1)
Alkaline phosphatase (APISO): 87 U/L (ref 37–153)
BUN: 11 mg/dL (ref 7–25)
CO2: 28 mmol/L (ref 20–32)
Calcium: 10 mg/dL (ref 8.6–10.4)
Chloride: 104 mmol/L (ref 98–110)
Creat: 0.79 mg/dL (ref 0.60–1.00)
Globulin: 2.6 g/dL (calc) (ref 1.9–3.7)
Glucose, Bld: 101 mg/dL — ABNORMAL HIGH (ref 65–99)
Potassium: 3.9 mmol/L (ref 3.5–5.3)
Total Bilirubin: 0.5 mg/dL (ref 0.2–1.2)
eGFR: 80 mL/min/{1.73_m2} (ref >=60)

## 2022-07-05 LAB — CBC WITH DIFFERENTIAL/PLATELET
Absolute Monocytes: 578 cells/uL (ref 200–950)
Basophils Absolute: 77 cells/uL (ref 0–200)
Basophils Relative: 1 %
Eosinophils Absolute: 193 cells/uL (ref 15–500)
Eosinophils Relative: 2.5 %
MCH: 31.9 pg (ref 27.0–33.0)
MCHC: 34.4 g/dL (ref 32.0–36.0)
Monocytes Relative: 7.5 %
Neutro Abs: 4027 cells/uL (ref 1500–7800)
Neutrophils Relative %: 52.3 %
Platelets: 320 10*3/uL (ref 140–400)
RDW: 12.3 % (ref 11.0–15.0)
Total Lymphocyte: 36.7 %

## 2022-07-05 LAB — T-HELPER CELLS (CD4) COUNT (NOT AT ARMC)
Absolute CD4: 734 cells/uL (ref 490–1740)
CD4 T Helper %: 26 % — ABNORMAL LOW (ref 30–61)

## 2022-07-05 LAB — HIV-1 RNA QUANT-NO REFLEX-BLD
HIV 1 RNA Quant: NOT DETECTED Copies/mL
HIV-1 RNA Quant, Log: NOT DETECTED Log cps/mL

## 2022-07-05 LAB — RPR: RPR Ser Ql: NONREACTIVE

## 2022-07-15 ENCOUNTER — Ambulatory Visit: Payer: Medicare Other | Admitting: Internal Medicine

## 2022-07-24 ENCOUNTER — Other Ambulatory Visit (HOSPITAL_COMMUNITY): Payer: Self-pay

## 2022-07-24 ENCOUNTER — Other Ambulatory Visit: Payer: Self-pay | Admitting: Internal Medicine

## 2022-07-24 DIAGNOSIS — M62838 Other muscle spasm: Secondary | ICD-10-CM

## 2022-07-25 NOTE — Telephone Encounter (Signed)
Pending appt 7/3

## 2022-07-29 ENCOUNTER — Other Ambulatory Visit (HOSPITAL_COMMUNITY): Payer: Self-pay

## 2022-07-30 ENCOUNTER — Other Ambulatory Visit (HOSPITAL_COMMUNITY): Payer: Self-pay

## 2022-07-31 ENCOUNTER — Ambulatory Visit (INDEPENDENT_AMBULATORY_CARE_PROVIDER_SITE_OTHER): Payer: Medicare Other | Admitting: Internal Medicine

## 2022-07-31 ENCOUNTER — Encounter: Payer: Self-pay | Admitting: Internal Medicine

## 2022-07-31 ENCOUNTER — Other Ambulatory Visit (HOSPITAL_COMMUNITY): Payer: Self-pay

## 2022-07-31 ENCOUNTER — Other Ambulatory Visit: Payer: Self-pay

## 2022-07-31 VITALS — BP 123/74 | HR 85 | Temp 98.1°F | Ht 63.0 in | Wt 160.0 lb

## 2022-07-31 DIAGNOSIS — Z23 Encounter for immunization: Secondary | ICD-10-CM | POA: Diagnosis not present

## 2022-07-31 DIAGNOSIS — B20 Human immunodeficiency virus [HIV] disease: Secondary | ICD-10-CM | POA: Diagnosis not present

## 2022-07-31 DIAGNOSIS — M62838 Other muscle spasm: Secondary | ICD-10-CM

## 2022-07-31 DIAGNOSIS — E7800 Pure hypercholesterolemia, unspecified: Secondary | ICD-10-CM

## 2022-07-31 MED ORDER — CYCLOBENZAPRINE HCL 10 MG PO TABS
10.0000 mg | ORAL_TABLET | Freq: Every evening | ORAL | 5 refills | Status: DC | PRN
Start: 2022-07-31 — End: 2023-02-04
  Filled 2022-07-31: qty 30, 30d supply, fill #0
  Filled 2022-08-20 – 2022-08-21 (×2): qty 30, 30d supply, fill #1
  Filled 2022-09-18: qty 30, 30d supply, fill #2
  Filled 2022-10-10: qty 30, 30d supply, fill #3
  Filled 2022-11-11: qty 30, 30d supply, fill #4
  Filled 2022-12-02: qty 30, 30d supply, fill #5

## 2022-07-31 MED ORDER — ROSUVASTATIN CALCIUM 5 MG PO TABS
5.0000 mg | ORAL_TABLET | Freq: Every day | ORAL | 3 refills | Status: DC
Start: 2022-07-31 — End: 2023-02-10
  Filled 2022-07-31 – 2022-08-21 (×4): qty 90, 90d supply, fill #0
  Filled 2022-11-18: qty 90, 90d supply, fill #1
  Filled 2023-02-04: qty 90, 90d supply, fill #2

## 2022-07-31 MED ORDER — BIKTARVY 50-200-25 MG PO TABS
1.0000 | ORAL_TABLET | Freq: Every day | ORAL | 11 refills | Status: DC
Start: 2022-07-31 — End: 2023-02-10
  Filled 2022-07-31 – 2022-08-20 (×2): qty 30, 30d supply, fill #0
  Filled 2022-09-18: qty 30, 30d supply, fill #1
  Filled 2022-10-10: qty 30, 30d supply, fill #2
  Filled 2022-11-11: qty 30, 30d supply, fill #3
  Filled 2022-12-02: qty 30, 30d supply, fill #4
  Filled 2023-01-10: qty 30, 30d supply, fill #5
  Filled 2023-02-04: qty 30, 30d supply, fill #6

## 2022-07-31 NOTE — Progress Notes (Signed)
RFV: follow up for hiv disease  Patient ID: [name], female   DOB: 02/14/1950, 72 y.o.   MRN: [id number]  HPI  [name] is a 72yo F with well controlled Hiv disease, CD 4 count 734/VL< 20 on biktarvy; has finished treatment for breast cancer, now in remission. She reports being in good health. No issues with medication. She does take occasional flexeril for neck pain associated with disc disease. No recent illness.  Outpatient Encounter Medications as of 07/31/2022  Medication Sig   amLODipine (NORVASC) 10 MG tablet Take 1 tablet (10 mg total) by mouth daily.   aspirin EC 81 MG tablet Take 81 mg by mouth daily.   benazepril (LOTENSIN) 40 MG tablet Take 1 tablet (40 mg total) by mouth daily.   bictegravir-emtricitabine-tenofovir AF (BIKTARVY) 50-200-25 MG TABS tablet Take 1 tablet by mouth daily.   Biotin 5000 MCG CAPS Take 5,000 mcg daily by mouth.   Coenzyme Q10 300 MG CAPS Take 300 capsules by mouth daily.   cyclobenzaprine (FLEXERIL) 10 MG tablet Take 1 tablet (10 mg total) by mouth at bedtime as needed.   fexofenadine (ALLEGRA) 180 MG tablet Take 180 mg daily by mouth.   gabapentin (NEURONTIN) 100 MG capsule Take 1 capsule (100 mg total) by mouth 2 (two) times daily.   metoprolol tartrate (LOPRESSOR) 25 MG tablet Take 1/2 tablet (12.5 mg total) by mouth twice daily   Multiple Vitamin (MULTIVITAMIN WITH MINERALS) TABS tablet Take 1 tablet by mouth daily.   Omega-3 Fatty Acids (FISH OIL) 1200 MG CAPS Take 1,200 mg by mouth 2 (two) times daily.   polyethylene glycol (MIRALAX / GLYCOLAX) packet Take 17 g by mouth daily.   rosuvastatin (CRESTOR) 5 MG tablet Take 1 tablet (5 mg total) by mouth at bedtime.   acetaminophen (TYLENOL) 500 MG tablet Take 500 mg by mouth every 6 (six) hours as needed. (Patient not taking: Reported on 07/31/2022)   Facility-Administered Encounter Medications as of 07/31/2022  Medication   sodium chloride flush (NS) 0.9 % injection 10 mL     Patient Active  Problem List   Diagnosis Date Noted   Aortic atherosclerosis ([facility]) 10/07/2017   Oral thrush 06/26/2017   Dry mouth & dry eyes  06/26/2017   Muscle spasms of neck 11/25/2016   Drug-induced neutropenia ([facility]) 04/23/2016   Malignant neoplasm of upper-outer quadrant of left breast in female, estrogen receptor negative ([facility]) 12/11/2015   Hereditary and idiopathic peripheral neuropathy 07/16/2012   Lumbar neuritis 07/16/2012   Cervicalgia 07/16/2012   URI 12/02/2006   Human immunodeficiency virus (HIV) disease ([facility]) 11/08/2005   Hyperlipidemia 11/08/2005   Essential hypertension 11/08/2005   BRONCHITIS 11/08/2005   Regional enteritis ([facility]) 11/08/2005   SYMPTOM, PAINFUL RESPIRATION 11/08/2005     Health Maintenance Due  Topic Date Due   Medicare Annual Wellness (AWV)  Never done   Zoster Vaccines- Shingrix (1 of 2) Never done   Colonoscopy  Never done   DEXA SCAN  Never done   COVID-19 Vaccine (5 - 2023-24 season) 09/28/2021   DTaP/Tdap/Td (3 - Td or Tdap) 11/02/2021   Pneumonia Vaccine 31+ Years old (3 of 3 - PPSV23 or PCV20) 05/28/2022     Review of Systems  Constitutional: Negative for fever, chills, diaphoresis, activity change, appetite change, fatigue and unexpected weight change.  HENT: Negative for congestion, sore throat, rhinorrhea, sneezing, trouble swallowing and sinus pressure.  Eyes: Negative for photophobia and visual disturbance.  Respiratory: Negative for cough, chest tightness, shortness of  breath, wheezing and stridor.  Cardiovascular: Negative for chest pain, palpitations and leg swelling.  Gastrointestinal: Negative for nausea, vomiting, abdominal pain, diarrhea, constipation, blood in stool, abdominal distention and anal bleeding.  Genitourinary: Negative for dysuria, hematuria, flank pain and difficulty urinating.  Musculoskeletal: Negative for myalgias, back pain, joint swelling, arthralgias and gait problem.  Skin: Negative for color change, pallor, rash and  wound.  Neurological: Negative for dizziness, tremors, weakness and light-headedness.  Hematological: Negative for adenopathy. Does not bruise/bleed easily.  Psychiatric/Behavioral: Negative for behavioral problems, confusion, sleep disturbance, dysphoric mood, decreased concentration and agitation.   Physical Exam   BP 123/74   Pulse 85   Temp 98.1 F (36.7 C) (Temporal)   Ht 5\' 3"  (1.6 m)   Wt 160 lb (72.6 kg)   SpO2 95%   BMI 28.34 kg/m   Physical Exam  Constitutional:  oriented to person, place, and time. appears well-developed and well-nourished. No distress.  HENT: [place]/AT, PERRLA, no scleral icterus Mouth/Throat: Oropharynx is clear and moist. No oropharyngeal exudate.  Cardiovascular: Normal rate, regular rhythm and normal heart sounds. Exam reveals no gallop and no friction rub.  No murmur heard.  Pulmonary/Chest: Effort normal and breath sounds normal. No respiratory distress.  has no wheezes.  Lymphadenopathy: no cervical adenopathy. No axillary adenopathy Neurological: alert and oriented to person, place, and time.  Skin: Skin is warm and dry. No rash noted. No erythema.  Psychiatric: a normal mood and affect.  behavior is normal.   Lab Results  Component Value Date   CD4TCELL 26 (L) 07/01/2022   Lab Results  Component Value Date   CD4TABS 556 12/31/2021   CD4TABS 702 06/18/2021   CD4TABS 644 12/18/2020   Lab Results  Component Value Date   HIV1RNAQUANT Not Detected 07/01/2022   Lab Results  Component Value Date   HEPBSAB No 03/24/2006   Lab Results  Component Value Date   LABRPR NON-REACTIVE 07/01/2022    CBC Lab Results  Component Value Date   WBC 7.7 07/01/2022   RBC 4.99 07/01/2022   HGB 15.9 (H) 07/01/2022   HCT 46.2 (H) 07/01/2022   PLT 320 07/01/2022   MCV 92.6 07/01/2022   MCH 31.9 07/01/2022   MCHC 34.4 07/01/2022   RDW 12.3 07/01/2022   LYMPHSABS 2,826 07/01/2022   MONOABS 0.7 02/01/2019   EOSABS 193 07/01/2022    BMET Lab  Results  Component Value Date   NA 143 07/01/2022   K 3.9 07/01/2022   CL 104 07/01/2022   CO2 28 07/01/2022   GLUCOSE 101 (H) 07/01/2022   BUN 11 07/01/2022   CREATININE 0.79 07/01/2022   CALCIUM 10.0 07/01/2022   GFRNONAA 67 09/06/2019   GFRAA 78 09/06/2019      Assessment and Plan   Hiv disease= continue on biktarvy; gave refills  Long term medication management = cr stable  Health maintenance = will recommend rsv, flu, and covid-19 in the fall; will give PCV 20.  Hx of breast ca = reach out to lindsay causey, to see If needs follow up

## 2022-08-20 ENCOUNTER — Other Ambulatory Visit: Payer: Self-pay

## 2022-08-20 ENCOUNTER — Other Ambulatory Visit (HOSPITAL_COMMUNITY): Payer: Self-pay

## 2022-08-21 ENCOUNTER — Other Ambulatory Visit: Payer: Self-pay

## 2022-08-21 ENCOUNTER — Other Ambulatory Visit (HOSPITAL_COMMUNITY): Payer: Self-pay

## 2022-08-23 ENCOUNTER — Other Ambulatory Visit: Payer: Self-pay

## 2022-08-24 ENCOUNTER — Other Ambulatory Visit (HOSPITAL_COMMUNITY): Payer: Self-pay

## 2022-09-18 ENCOUNTER — Other Ambulatory Visit (HOSPITAL_COMMUNITY): Payer: Self-pay

## 2022-09-18 ENCOUNTER — Other Ambulatory Visit: Payer: Self-pay | Admitting: Internal Medicine

## 2022-09-18 MED ORDER — METOPROLOL TARTRATE 25 MG PO TABS
12.5000 mg | ORAL_TABLET | Freq: Two times a day (BID) | ORAL | 6 refills | Status: DC
  Filled 2022-09-18: qty 30, 30d supply, fill #0
  Filled 2022-10-10: qty 30, 30d supply, fill #1
  Filled 2022-11-11: qty 30, 30d supply, fill #2
  Filled 2022-12-02: qty 30, 30d supply, fill #3
  Filled 2023-01-10: qty 30, 30d supply, fill #4
  Filled 2023-02-04: qty 30, 30d supply, fill #5

## 2022-09-19 ENCOUNTER — Other Ambulatory Visit: Payer: Self-pay

## 2022-09-20 ENCOUNTER — Other Ambulatory Visit (HOSPITAL_COMMUNITY): Payer: Self-pay

## 2022-09-26 ENCOUNTER — Other Ambulatory Visit (HOSPITAL_COMMUNITY): Payer: Self-pay

## 2022-09-26 ENCOUNTER — Other Ambulatory Visit: Payer: Self-pay | Admitting: Internal Medicine

## 2022-09-26 DIAGNOSIS — I10 Essential (primary) hypertension: Secondary | ICD-10-CM

## 2022-09-26 MED ORDER — AMLODIPINE BESYLATE 10 MG PO TABS
10.0000 mg | ORAL_TABLET | Freq: Every day | ORAL | 2 refills | Status: DC
Start: 2022-09-26 — End: 2022-12-02
  Filled 2022-09-26: qty 30, 30d supply, fill #0
  Filled 2022-10-10: qty 30, 30d supply, fill #1
  Filled 2022-11-11: qty 30, 30d supply, fill #2

## 2022-10-10 ENCOUNTER — Other Ambulatory Visit (HOSPITAL_COMMUNITY): Payer: Self-pay

## 2022-10-24 ENCOUNTER — Telehealth: Payer: Self-pay

## 2022-10-24 ENCOUNTER — Other Ambulatory Visit (HOSPITAL_COMMUNITY): Payer: Self-pay

## 2022-10-24 NOTE — Telephone Encounter (Signed)
[facility] Patient Advocate Encounter   I was successful in securing patient a $5000 grant from [employer] (PAF) to provide copayment coverage for BIKTARVY.  This will make the out of pocket cost $0.     I have spoken with the patient.       Patient knows to call the office with questions or concerns. Kae Heller, CPhT [facility] Patient [facility] Phone: [phone] Fax:  [phone]

## 2022-10-24 NOTE — Telephone Encounter (Signed)
error 

## 2022-11-11 ENCOUNTER — Other Ambulatory Visit: Payer: Self-pay

## 2022-11-11 NOTE — Progress Notes (Signed)
Specialty Pharmacy Refill Coordination Note  [name] is a 72 y.o. female contacted today regarding refills of specialty medication(s) Bictegravir-Emtricitab-Tenofov   Patient requested Delivery   Delivery date: 11/18/22   Verified address: [address]   [place] [address]   Medication will be filled on 11/15/19.

## 2022-11-15 ENCOUNTER — Other Ambulatory Visit: Payer: Self-pay

## 2022-11-18 ENCOUNTER — Other Ambulatory Visit: Payer: Self-pay

## 2022-11-18 ENCOUNTER — Other Ambulatory Visit: Payer: Self-pay | Admitting: Internal Medicine

## 2022-11-18 DIAGNOSIS — I10 Essential (primary) hypertension: Secondary | ICD-10-CM

## 2022-11-18 MED ORDER — BENAZEPRIL HCL 40 MG PO TABS
40.0000 mg | ORAL_TABLET | Freq: Every day | ORAL | 0 refills | Status: DC
Start: 2022-11-18 — End: 2023-02-10
  Filled 2022-11-18: qty 90, 90d supply, fill #0

## 2022-12-02 ENCOUNTER — Other Ambulatory Visit: Payer: Self-pay

## 2022-12-02 ENCOUNTER — Other Ambulatory Visit: Payer: Self-pay | Admitting: Internal Medicine

## 2022-12-02 DIAGNOSIS — I10 Essential (primary) hypertension: Secondary | ICD-10-CM

## 2022-12-02 MED ORDER — AMLODIPINE BESYLATE 10 MG PO TABS
10.0000 mg | ORAL_TABLET | Freq: Every day | ORAL | 5 refills | Status: DC
Start: 2022-12-02 — End: 2023-02-10
  Filled 2022-12-17: qty 30, 30d supply, fill #0
  Filled 2023-01-10: qty 30, 30d supply, fill #1
  Filled 2023-02-04: qty 30, 30d supply, fill #2

## 2022-12-02 NOTE — Progress Notes (Signed)
Specialty Pharmacy Refill Coordination Note  [name] is a 72 y.o. female contacted today regarding refills of specialty medication(s) Bictegravir-Emtricitab-Tenofov   Patient requested Delivery   Delivery date: 12/18/22   Verified address: [address]   [place] [address]   Medication will be filled on 12/17/22.

## 2022-12-03 ENCOUNTER — Other Ambulatory Visit (HOSPITAL_COMMUNITY): Payer: Self-pay

## 2022-12-05 ENCOUNTER — Other Ambulatory Visit (HOSPITAL_COMMUNITY): Payer: Self-pay

## 2022-12-16 DIAGNOSIS — K5909 Other constipation: Secondary | ICD-10-CM | POA: Diagnosis not present

## 2022-12-16 DIAGNOSIS — R198 Other specified symptoms and signs involving the digestive system and abdomen: Secondary | ICD-10-CM | POA: Diagnosis not present

## 2022-12-17 ENCOUNTER — Other Ambulatory Visit: Payer: Self-pay | Admitting: Internal Medicine

## 2022-12-17 ENCOUNTER — Other Ambulatory Visit (HOSPITAL_COMMUNITY): Payer: Self-pay

## 2022-12-17 ENCOUNTER — Other Ambulatory Visit: Payer: Self-pay

## 2022-12-17 DIAGNOSIS — T451X5A Adverse effect of antineoplastic and immunosuppressive drugs, initial encounter: Secondary | ICD-10-CM

## 2022-12-17 MED ORDER — GABAPENTIN 100 MG PO CAPS
100.0000 mg | ORAL_CAPSULE | Freq: Two times a day (BID) | ORAL | 0 refills | Status: DC
Start: 2022-12-17 — End: 2023-02-10
  Filled 2022-12-17: qty 60, 30d supply, fill #0

## 2022-12-18 ENCOUNTER — Other Ambulatory Visit: Payer: Self-pay

## 2022-12-18 DIAGNOSIS — K589 Irritable bowel syndrome without diarrhea: Secondary | ICD-10-CM | POA: Diagnosis not present

## 2022-12-18 DIAGNOSIS — Z860101 Personal history of adenomatous and serrated colon polyps: Secondary | ICD-10-CM | POA: Diagnosis not present

## 2022-12-18 DIAGNOSIS — K5730 Diverticulosis of large intestine without perforation or abscess without bleeding: Secondary | ICD-10-CM | POA: Diagnosis not present

## 2022-12-18 DIAGNOSIS — Z09 Encounter for follow-up examination after completed treatment for conditions other than malignant neoplasm: Secondary | ICD-10-CM | POA: Diagnosis not present

## 2022-12-18 DIAGNOSIS — K6289 Other specified diseases of anus and rectum: Secondary | ICD-10-CM | POA: Diagnosis not present

## 2022-12-18 DIAGNOSIS — K5140 Inflammatory polyps of colon without complications: Secondary | ICD-10-CM | POA: Diagnosis not present

## 2022-12-20 DIAGNOSIS — K5140 Inflammatory polyps of colon without complications: Secondary | ICD-10-CM | POA: Diagnosis not present

## 2023-01-09 ENCOUNTER — Other Ambulatory Visit (HOSPITAL_COMMUNITY): Payer: Self-pay

## 2023-01-10 ENCOUNTER — Other Ambulatory Visit (HOSPITAL_COMMUNITY): Payer: Self-pay

## 2023-01-10 ENCOUNTER — Other Ambulatory Visit (HOSPITAL_COMMUNITY): Payer: Self-pay | Admitting: Pharmacy Technician

## 2023-01-10 NOTE — Progress Notes (Signed)
Specialty Pharmacy Refill Coordination Note  [name] is a 72 y.o. female contacted today regarding refills of specialty medication(s) Bictegravir-Emtricitab-Tenofov [facility])   Patient requested Delivery   Delivery date: 01/14/23   Verified address: [address]  [place]   Medication will be filled on 01/13/23.

## 2023-01-27 ENCOUNTER — Other Ambulatory Visit: Payer: Self-pay

## 2023-01-27 ENCOUNTER — Other Ambulatory Visit: Payer: Medicare Other

## 2023-01-27 DIAGNOSIS — B20 Human immunodeficiency virus [HIV] disease: Secondary | ICD-10-CM

## 2023-01-27 DIAGNOSIS — Z113 Encounter for screening for infections with a predominantly sexual mode of transmission: Secondary | ICD-10-CM

## 2023-01-29 LAB — COMPLETE METABOLIC PANEL WITH GFR
AG Ratio: 1.7 (calc) (ref 1.0–2.5)
ALT: 19 U/L (ref 6–29)
AST: 25 U/L (ref 10–35)
Albumin: 5 g/dL (ref 3.6–5.1)
Alkaline phosphatase (APISO): 90 U/L (ref 37–153)
BUN: 15 mg/dL (ref 7–25)
CO2: 30 mmol/L (ref 20–32)
Calcium: 9.8 mg/dL (ref 8.6–10.4)
Chloride: 102 mmol/L (ref 98–110)
Creat: 0.81 mg/dL (ref 0.60–1.00)
Globulin: 3 g/dL (ref 1.9–3.7)
Glucose, Bld: 85 mg/dL (ref 65–99)
Potassium: 3.8 mmol/L (ref 3.5–5.3)
Sodium: 142 mmol/L (ref 135–146)
Total Bilirubin: 0.5 mg/dL (ref 0.2–1.2)
Total Protein: 8 g/dL (ref 6.1–8.1)
eGFR: 78 mL/min/{1.73_m2} (ref >=60)

## 2023-01-29 LAB — CBC WITH DIFFERENTIAL/PLATELET
Absolute Lymphocytes: 2570 {cells}/uL (ref 850–3900)
Absolute Monocytes: 540 {cells}/uL (ref 200–950)
Basophils Absolute: 80 {cells}/uL (ref 0–200)
Basophils Relative: 1.1 %
Eosinophils Absolute: 183 {cells}/uL (ref 15–500)
Eosinophils Relative: 2.5 %
HCT: 47.9 % — ABNORMAL HIGH (ref 35.0–45.0)
Hemoglobin: 16.1 g/dL — ABNORMAL HIGH (ref 11.7–15.5)
MCH: 32.1 pg (ref 27.0–33.0)
MCHC: 33.6 g/dL (ref 32.0–36.0)
MCV: 95.6 fL (ref 80.0–100.0)
MPV: 10.3 fL (ref 7.5–12.5)
Monocytes Relative: 7.4 %
Neutro Abs: 3927 {cells}/uL (ref 1500–7800)
Neutrophils Relative %: 53.8 %
Platelets: 320 10*3/uL (ref 140–400)
RBC: 5.01 10*6/uL (ref 3.80–5.10)
RDW: 12.3 % (ref 11.0–15.0)
Total Lymphocyte: 35.2 %
WBC: 7.3 10*3/uL (ref 3.8–10.8)

## 2023-01-29 LAB — HIV-1 RNA QUANT-NO REFLEX-BLD
HIV 1 RNA Quant: NOT DETECTED {copies}/mL
HIV-1 RNA Quant, Log: NOT DETECTED {Log}

## 2023-01-29 LAB — RPR: RPR Ser Ql: NONREACTIVE

## 2023-02-04 ENCOUNTER — Other Ambulatory Visit: Payer: Self-pay | Admitting: Internal Medicine

## 2023-02-04 ENCOUNTER — Other Ambulatory Visit: Payer: Self-pay

## 2023-02-04 DIAGNOSIS — I10 Essential (primary) hypertension: Secondary | ICD-10-CM

## 2023-02-04 DIAGNOSIS — T451X5A Adverse effect of antineoplastic and immunosuppressive drugs, initial encounter: Secondary | ICD-10-CM

## 2023-02-04 DIAGNOSIS — M62838 Other muscle spasm: Secondary | ICD-10-CM

## 2023-02-04 NOTE — Telephone Encounter (Signed)
 Appt 02/10/23

## 2023-02-04 NOTE — Progress Notes (Signed)
 Specialty Pharmacy Refill Coordination Note  [name] is a 73 y.o. female contacted today regarding refills of specialty medication(s) Bictegravir-Emtricitab-Tenofov (Biktarvy )   Patient requested Delivery   Delivery date: 02/11/23   Verified address: [address]  [place]   Medication will be filled on 02/10/23.   Patient requested 8 outpatient medications in same shipment.

## 2023-02-10 ENCOUNTER — Other Ambulatory Visit (HOSPITAL_COMMUNITY): Payer: Self-pay

## 2023-02-10 ENCOUNTER — Ambulatory Visit (INDEPENDENT_AMBULATORY_CARE_PROVIDER_SITE_OTHER): Payer: Medicare Other | Admitting: Internal Medicine

## 2023-02-10 ENCOUNTER — Other Ambulatory Visit: Payer: Self-pay

## 2023-02-10 VITALS — Ht 63.0 in | Wt 160.0 lb

## 2023-02-10 DIAGNOSIS — B20 Human immunodeficiency virus [HIV] disease: Secondary | ICD-10-CM

## 2023-02-10 DIAGNOSIS — E7800 Pure hypercholesterolemia, unspecified: Secondary | ICD-10-CM

## 2023-02-10 DIAGNOSIS — G620 Drug-induced polyneuropathy: Secondary | ICD-10-CM

## 2023-02-10 DIAGNOSIS — I10 Essential (primary) hypertension: Secondary | ICD-10-CM

## 2023-02-10 MED ORDER — AMLODIPINE BESYLATE 10 MG PO TABS
10.0000 mg | ORAL_TABLET | Freq: Every day | ORAL | 3 refills | Status: DC
  Filled 2023-02-10: qty 90, 90d supply, fill #0
  Filled 2023-05-07 – 2023-05-12 (×2): qty 90, 90d supply, fill #1
  Filled 2023-08-04: qty 90, 90d supply, fill #2
  Filled 2023-10-30: qty 90, 90d supply, fill #3

## 2023-02-10 MED ORDER — METOPROLOL TARTRATE 25 MG PO TABS
12.5000 mg | ORAL_TABLET | Freq: Two times a day (BID) | ORAL | 6 refills | Status: DC
  Filled 2023-02-10: qty 30, 30d supply, fill #0
  Filled 2023-02-27: qty 30, 30d supply, fill #1
  Filled 2023-04-04: qty 30, 30d supply, fill #2
  Filled 2023-05-07 – 2023-05-12 (×2): qty 30, 30d supply, fill #3
  Filled 2023-06-04: qty 30, 30d supply, fill #4
  Filled 2023-07-03: qty 30, 30d supply, fill #5
  Filled 2023-08-04: qty 30, 30d supply, fill #6

## 2023-02-10 MED ORDER — BENAZEPRIL HCL 40 MG PO TABS
40.0000 mg | ORAL_TABLET | Freq: Every day | ORAL | 3 refills | Status: DC
  Filled 2023-02-10: qty 90, 90d supply, fill #0
  Filled 2023-05-07 – 2023-05-12 (×2): qty 90, 90d supply, fill #1
  Filled 2023-08-04: qty 90, 90d supply, fill #2
  Filled 2023-10-30: qty 90, 90d supply, fill #3

## 2023-02-10 MED ORDER — GABAPENTIN 100 MG PO CAPS
100.0000 mg | ORAL_CAPSULE | Freq: Two times a day (BID) | ORAL | 11 refills | Status: DC
  Filled 2023-02-10: qty 60, 30d supply, fill #0
  Filled 2023-02-27: qty 60, 30d supply, fill #1
  Filled 2023-05-07 – 2023-05-12 (×2): qty 60, 30d supply, fill #2
  Filled 2023-07-03: qty 60, 30d supply, fill #3
  Filled 2023-08-04: qty 60, 30d supply, fill #4
  Filled 2023-10-02: qty 60, 30d supply, fill #5
  Filled 2023-12-01: qty 60, 30d supply, fill #6
  Filled 2023-12-31: qty 60, 30d supply, fill #7

## 2023-02-10 MED ORDER — BIKTARVY 50-200-25 MG PO TABS
1.0000 | ORAL_TABLET | Freq: Every day | ORAL | 11 refills | Status: DC
  Filled 2023-02-10: qty 30, 30d supply, fill #0
  Filled 2023-02-27: qty 30, 30d supply, fill #1
  Filled 2023-04-04 – 2023-04-07 (×2): qty 30, 30d supply, fill #2
  Filled 2023-05-07: qty 30, 30d supply, fill #3
  Filled 2023-06-04: qty 30, 30d supply, fill #4
  Filled 2023-07-03: qty 30, 30d supply, fill #5
  Filled 2023-08-04: qty 30, 30d supply, fill #6
  Filled 2023-09-01: qty 30, 30d supply, fill #7
  Filled 2023-09-30 – 2023-10-02 (×2): qty 30, 30d supply, fill #8
  Filled 2023-10-30: qty 30, 30d supply, fill #9
  Filled 2023-11-26 – 2023-12-01 (×4): qty 30, 30d supply, fill #10
  Filled 2023-12-26 – 2023-12-30 (×2): qty 30, 30d supply, fill #11

## 2023-02-10 MED ORDER — ROSUVASTATIN CALCIUM 5 MG PO TABS
5.0000 mg | ORAL_TABLET | Freq: Every day | ORAL | 3 refills | Status: DC
  Filled 2023-02-10: qty 90, 90d supply, fill #0
  Filled 2023-05-07 – 2023-05-12 (×2): qty 90, 90d supply, fill #1
  Filled 2023-08-04: qty 90, 90d supply, fill #2
  Filled 2023-10-30: qty 90, 90d supply, fill #3

## 2023-02-10 NOTE — Progress Notes (Signed)
 This is telemedicine - phone visit Used 2 identifiers/consent for telehealth Patient is at home Provider in clinic  Patient ID: [name], female   DOB: 12-07-1950, 73 y.o.   MRN: [id number]  HPI [name] is a 73yo F with well controlled hiv disease, No problems with taking biktarvy . Breast ca in remission; she reports that she recently  had her colonoscopy - one had 10 mm polyp in the cecum, diverticulosis in colon, and hemorrhoid - sees back in 5 years.-- follows with dr. lawernce. Otherwise, she reports to be in good health.no recent illness. She reports that she avoids crowded places.   Outpatient Encounter Medications as of 02/10/2023  Medication Sig   amLODipine  (NORVASC ) 10 MG tablet Take 1 tablet (10 mg total) by mouth daily.   aspirin EC 81 MG tablet Take 81 mg by mouth daily.   benazepril  (LOTENSIN ) 40 MG tablet Take 1 tablet (40 mg total) by mouth daily.   bictegravir-emtricitabine -tenofovir  AF (BIKTARVY ) 50-200-25 MG TABS tablet Take 1 tablet by mouth daily.   Biotin 5000 MCG CAPS Take 5,000 mcg daily by mouth.   Coenzyme Q10 300 MG CAPS Take 300 capsules by mouth daily.   cyclobenzaprine  (FLEXERIL ) 10 MG tablet Take 1 tablet (10 mg total) by mouth at bedtime as needed.   fexofenadine (ALLEGRA) 180 MG tablet Take 180 mg daily by mouth.   gabapentin  (NEURONTIN ) 100 MG capsule Take 1 capsule (100 mg total) by mouth 2 (two) times daily.   metoprolol  tartrate (LOPRESSOR ) 25 MG tablet Take 0.5 tablets (12.5 mg total) by mouth 2 (two) times daily.   Multiple Vitamin (MULTIVITAMIN WITH MINERALS) TABS tablet Take 1 tablet by mouth daily.   Omega-3 Fatty Acids (FISH OIL) 1200 MG CAPS Take 1,200 mg by mouth 2 (two) times daily.   polyethylene glycol (MIRALAX / GLYCOLAX) packet Take 17 g by mouth daily.   rosuvastatin  (CRESTOR ) 5 MG tablet Take 1 tablet (5 mg total) by mouth at bedtime.   acetaminophen  (TYLENOL ) 500 MG tablet Take 500 mg by mouth every 6 (six) hours as needed.  (Patient not taking: Reported on 02/10/2023)   Facility-Administered Encounter Medications as of 02/10/2023  Medication   sodium chloride  flush (NS) 0.9 % injection 10 mL     Patient Active Problem List   Diagnosis Date Noted   Aortic atherosclerosis (HCC) 10/07/2017   Oral thrush 06/26/2017   Dry mouth & dry eyes  06/26/2017   Muscle spasms of neck 11/25/2016   Drug-induced neutropenia ([facility]) 04/23/2016   Malignant neoplasm of upper-outer quadrant of left breast in female, estrogen receptor negative ([facility]) 12/11/2015   Hereditary and idiopathic peripheral neuropathy 07/16/2012   Lumbar neuritis 07/16/2012   Cervicalgia 07/16/2012   URI 12/02/2006   Human immunodeficiency virus (HIV) disease ([facility]) 11/08/2005   Hyperlipidemia 11/08/2005   Essential hypertension 11/08/2005   BRONCHITIS 11/08/2005   Regional enteritis (HCC) 11/08/2005   SYMPTOM, PAINFUL RESPIRATION 11/08/2005     Health Maintenance Due  Topic Date Due   Medicare Annual Wellness (AWV)  Never done   Zoster Vaccines- Shingrix (1 of 2) Never done   DEXA SCAN  Never done   DTaP/Tdap/Td (3 - Td or Tdap) 11/02/2021   INFLUENZA VACCINE  08/29/2022   COVID-19 Vaccine (5 - 2024-25 season) 09/29/2022     Review of Systems 12 point ros is otherwise negative Physical Exam   Ht 5' 3 (1.6 m)   Wt 160 lb (72.6 kg) Comment: last weight  BMI 28.34 kg/m  No physical exam since phone visit Speech is fluent  Lab Results  Component Value Date   CD4TCELL 26 (L) 07/01/2022   Lab Results  Component Value Date   CD4TABS 556 12/31/2021   CD4TABS 702 06/18/2021   CD4TABS 644 12/18/2020   Lab Results  Component Value Date   HIV1RNAQUANT Not Detected 01/27/2023   Lab Results  Component Value Date   HEPBSAB No 03/24/2006   Lab Results  Component Value Date   LABRPR NON-REACTIVE 01/27/2023    CBC Lab Results  Component Value Date   WBC 7.3 01/27/2023   RBC 5.01 01/27/2023   HGB 16.1 (H) 01/27/2023   HCT 47.9  (H) 01/27/2023   PLT 320 01/27/2023   MCV 95.6 01/27/2023   MCH 32.1 01/27/2023   MCHC 33.6 01/27/2023   RDW 12.3 01/27/2023   LYMPHSABS 2,826 07/01/2022   MONOABS 0.7 02/01/2019   EOSABS 183 01/27/2023    BMET Lab Results  Component Value Date   NA 142 01/27/2023   K 3.8 01/27/2023   CL 102 01/27/2023   CO2 30 01/27/2023   GLUCOSE 85 01/27/2023   BUN 15 01/27/2023   CREATININE 0.81 01/27/2023   CALCIUM  9.8 01/27/2023   GFRNONAA 67 09/06/2019   GFRAA 78 09/06/2019   Had flu and covid vaccine oct 15th at [facility], recovered in 5 days- injection erythema   Assessment and Plan  HIV disease= will plan to come in to do CD 4 count -unclear why test was cancelled. Patient has well controlled hiv disease, VL undetectable  Long term medication = cr stable  Concern that hgb is higher than usual but - still stable Will refer to lindsey causey for follow up with hematology  Hx of breast cancer = will track down what are the next screening appt  I have personally spent 20 minutes involved in face-to-face and non-face-to-face activities for this patient on the day of the visit. Professional time spent includes the following activities: Preparing to see the patient (review of tests), Obtaining and/or reviewing separately obtained history (admission/discharge record), Performing a medically appropriate examination and/or evaluation , Ordering medications/tests/procedures, referring and communicating with other health care professionals, Documenting clinical information in the EMR, Independently interpreting results (not separately reported), Communicating results to the patient/family/caregiver, Counseling and educating the patient/family/caregiver and Care coordination (not separately reported).

## 2023-02-11 ENCOUNTER — Other Ambulatory Visit: Payer: Self-pay

## 2023-02-11 ENCOUNTER — Other Ambulatory Visit (HOSPITAL_COMMUNITY): Payer: Self-pay

## 2023-02-12 ENCOUNTER — Other Ambulatory Visit: Payer: Self-pay

## 2023-02-13 ENCOUNTER — Other Ambulatory Visit: Payer: Medicare Other

## 2023-02-13 ENCOUNTER — Other Ambulatory Visit: Payer: Self-pay

## 2023-02-13 DIAGNOSIS — Z113 Encounter for screening for infections with a predominantly sexual mode of transmission: Secondary | ICD-10-CM | POA: Diagnosis not present

## 2023-02-13 DIAGNOSIS — B20 Human immunodeficiency virus [HIV] disease: Secondary | ICD-10-CM

## 2023-02-13 NOTE — Addendum Note (Signed)
Addended by: Harley Alto on: 02/13/2023 09:12 AM   Modules accepted: Orders

## 2023-02-13 NOTE — Addendum Note (Signed)
Addended by: Harley Alto on: 02/13/2023 08:19 AM   Modules accepted: Orders

## 2023-02-14 LAB — T-HELPER CELLS (CD4) COUNT (NOT AT ARMC)
Absolute CD4: 613 {cells}/uL (ref 490–1740)
CD4 T Helper %: 26 % — ABNORMAL LOW (ref 30–61)
Total lymphocyte count: 2319 {cells}/uL (ref 850–3900)

## 2023-02-14 LAB — C. TRACHOMATIS/N. GONORRHOEAE RNA
C. trachomatis RNA, TMA: NOT DETECTED
N. gonorrhoeae RNA, TMA: NOT DETECTED

## 2023-02-26 ENCOUNTER — Other Ambulatory Visit: Payer: Self-pay

## 2023-02-27 ENCOUNTER — Other Ambulatory Visit: Payer: Self-pay

## 2023-02-27 ENCOUNTER — Other Ambulatory Visit: Payer: Self-pay | Admitting: Internal Medicine

## 2023-02-27 DIAGNOSIS — M62838 Other muscle spasm: Secondary | ICD-10-CM

## 2023-02-27 MED ORDER — CYCLOBENZAPRINE HCL 10 MG PO TABS
10.0000 mg | ORAL_TABLET | Freq: Every evening | ORAL | 5 refills | Status: DC | PRN
  Filled 2023-02-28: qty 30, 30d supply, fill #0
  Filled 2023-05-07 – 2023-05-12 (×2): qty 30, 30d supply, fill #1
  Filled 2023-06-04: qty 30, 30d supply, fill #2

## 2023-02-27 NOTE — Progress Notes (Signed)
Specialty Pharmacy Refill Coordination Note  [name] is a 73 y.o. female contacted today regarding refills of specialty medication(s) Bictegravir-Emtricitab-Tenofov Musician)   Patient requested Delivery   Delivery date: 03/11/23   Verified address: [address]   [place] [address]   Medication will be filled on 03/10/23.

## 2023-02-27 NOTE — Progress Notes (Unsigned)
Opened in error

## 2023-02-27 NOTE — Progress Notes (Signed)
Specialty Pharmacy Ongoing Clinical Assessment Note  [name] is a 73 y.o. female who is being followed by the specialty pharmacy service for RxSp HIV   Patient's specialty medication(s) reviewed today: Bictegravir-Emtricitab-Tenofov (Biktarvy)   Missed doses in the last 4 weeks: 0   Patient/Caregiver did not have any additional questions or concerns.   Therapeutic benefit summary: Patient is achieving benefit   Adverse events/side effects summary: No adverse events/side effects   Patient's therapy is appropriate to: Continue    Goals Addressed             This Visit's Progress    Achieve Undetectable HIV Viral Load < 20       Patient is on track. Patient will maintain adherence. Viral load remains undetectable long term.          Follow up:  6 months  Otto Herb Specialty Pharmacist

## 2023-02-28 ENCOUNTER — Other Ambulatory Visit: Payer: Self-pay

## 2023-03-10 ENCOUNTER — Other Ambulatory Visit (HOSPITAL_COMMUNITY): Payer: Self-pay

## 2023-03-10 ENCOUNTER — Other Ambulatory Visit: Payer: Self-pay

## 2023-03-13 ENCOUNTER — Other Ambulatory Visit (HOSPITAL_COMMUNITY): Payer: Self-pay

## 2023-03-13 ENCOUNTER — Other Ambulatory Visit: Payer: Self-pay

## 2023-03-20 ENCOUNTER — Other Ambulatory Visit (HOSPITAL_COMMUNITY): Payer: Self-pay

## 2023-03-20 ENCOUNTER — Other Ambulatory Visit (HOSPITAL_BASED_OUTPATIENT_CLINIC_OR_DEPARTMENT_OTHER): Payer: Self-pay

## 2023-03-27 ENCOUNTER — Other Ambulatory Visit: Payer: Self-pay

## 2023-04-01 ENCOUNTER — Other Ambulatory Visit (HOSPITAL_COMMUNITY): Payer: Self-pay

## 2023-04-04 ENCOUNTER — Other Ambulatory Visit: Payer: Self-pay | Admitting: Internal Medicine

## 2023-04-04 ENCOUNTER — Other Ambulatory Visit (HOSPITAL_COMMUNITY): Payer: Self-pay

## 2023-04-04 ENCOUNTER — Other Ambulatory Visit: Payer: Self-pay

## 2023-04-04 DIAGNOSIS — Z0000 Encounter for general adult medical examination without abnormal findings: Secondary | ICD-10-CM

## 2023-04-07 ENCOUNTER — Other Ambulatory Visit (HOSPITAL_COMMUNITY): Payer: Self-pay

## 2023-04-07 ENCOUNTER — Other Ambulatory Visit: Payer: Self-pay

## 2023-04-07 ENCOUNTER — Other Ambulatory Visit: Payer: Self-pay | Admitting: Pharmacy Technician

## 2023-04-07 NOTE — Progress Notes (Signed)
 Specialty Pharmacy Refill Coordination Note  [name] is a 73 y.o. female contacted today regarding refills of specialty medication(s) Bictegravir-Emtricitab-Tenofov Musician)   Patient requested Daryll Drown at [facility] [place] date: 04/12/23   Medication will be filled on 04/12/23.

## 2023-04-12 ENCOUNTER — Other Ambulatory Visit (HOSPITAL_COMMUNITY): Payer: Self-pay

## 2023-04-14 ENCOUNTER — Other Ambulatory Visit (HOSPITAL_COMMUNITY): Payer: Self-pay

## 2023-04-30 ENCOUNTER — Other Ambulatory Visit: Payer: Self-pay

## 2023-05-06 ENCOUNTER — Other Ambulatory Visit (HOSPITAL_COMMUNITY): Payer: Self-pay

## 2023-05-07 ENCOUNTER — Other Ambulatory Visit: Payer: Self-pay

## 2023-05-07 NOTE — Progress Notes (Signed)
 Specialty Pharmacy Refill Coordination Note  [name] is a 73 y.o. female contacted today regarding refills of specialty medication(s) Bictegravir-Emtricitab-Tenofov Musician)   Patient requested Delivery   Delivery date: 05/13/23   Verified address: [address]   [place] [address]   Medication will be filled on 05/12/23.

## 2023-05-12 ENCOUNTER — Other Ambulatory Visit (HOSPITAL_COMMUNITY): Payer: Self-pay

## 2023-05-12 ENCOUNTER — Other Ambulatory Visit: Payer: Self-pay

## 2023-05-13 DIAGNOSIS — K080 Exfoliation of teeth due to systemic causes: Secondary | ICD-10-CM | POA: Diagnosis not present

## 2023-05-28 ENCOUNTER — Other Ambulatory Visit (HOSPITAL_COMMUNITY): Payer: Self-pay

## 2023-06-02 ENCOUNTER — Other Ambulatory Visit (HOSPITAL_COMMUNITY): Payer: Self-pay

## 2023-06-04 ENCOUNTER — Other Ambulatory Visit: Payer: Self-pay

## 2023-06-04 ENCOUNTER — Other Ambulatory Visit (HOSPITAL_COMMUNITY): Payer: Self-pay

## 2023-06-04 NOTE — Progress Notes (Signed)
 Specialty Pharmacy Refill Coordination Note  [name] is a 73 y.o. female contacted today regarding refills of specialty medication(s) Bictegravir-Emtricitab-Tenofov (Biktarvy )   Patient requested Delivery   Delivery date: 06/10/23   Verified address: [address]   [place] [address]   Medication will be filled on 05.12.25.

## 2023-06-09 ENCOUNTER — Other Ambulatory Visit: Payer: Self-pay

## 2023-06-10 NOTE — Progress Notes (Signed)
 The 10-year ASCVD risk score (Arnett DK, et al., 2019) is: 12.9%   Values used to calculate the score:     Age: 73 years     Sex: Female     Is Non-Hispanic African American: No     Diabetic: No     Tobacco smoker: No     Systolic Blood Pressure: 123 mmHg     Is BP treated: Yes     HDL Cholesterol: 49 mg/dL     Total Cholesterol: 185 mg/dL  Arlon Bergamo, BSN, RN

## 2023-07-02 ENCOUNTER — Ambulatory Visit
Admission: RE | Admit: 2023-07-02 | Discharge: 2023-07-02 | Disposition: A | Discharge status: Home / Self Care | Source: Ambulatory Visit | Attending: Internal Medicine | Admitting: Internal Medicine

## 2023-07-02 DIAGNOSIS — Z1231 Encounter for screening mammogram for malignant neoplasm of breast: Secondary | ICD-10-CM | POA: Diagnosis not present

## 2023-07-02 DIAGNOSIS — Z0000 Encounter for general adult medical examination without abnormal findings: Secondary | ICD-10-CM

## 2023-07-03 ENCOUNTER — Other Ambulatory Visit: Payer: Self-pay | Admitting: Pharmacy Technician

## 2023-07-03 ENCOUNTER — Other Ambulatory Visit: Payer: Self-pay

## 2023-07-03 NOTE — Progress Notes (Signed)
 Specialty Pharmacy Refill Coordination Note  [name] is a 73 y.o. female contacted today regarding refills of specialty medication(s) Bictegravir-Emtricitab-Tenofov (Biktarvy )   Patient requested Delivery   Delivery date: 07/09/23   Verified address: [address]  [place]   Medication will be filled on 07/08/23.

## 2023-07-08 ENCOUNTER — Other Ambulatory Visit: Payer: Self-pay

## 2023-07-25 ENCOUNTER — Other Ambulatory Visit: Payer: Self-pay

## 2023-07-25 DIAGNOSIS — Z79899 Other long term (current) drug therapy: Secondary | ICD-10-CM

## 2023-07-25 DIAGNOSIS — B20 Human immunodeficiency virus [HIV] disease: Secondary | ICD-10-CM

## 2023-07-30 ENCOUNTER — Other Ambulatory Visit: Payer: Medicare Other

## 2023-07-30 ENCOUNTER — Other Ambulatory Visit: Payer: Self-pay

## 2023-07-30 DIAGNOSIS — Z79899 Other long term (current) drug therapy: Secondary | ICD-10-CM | POA: Diagnosis not present

## 2023-07-30 DIAGNOSIS — B20 Human immunodeficiency virus [HIV] disease: Secondary | ICD-10-CM

## 2023-07-31 LAB — T-HELPER CELL (CD4) - (RCID CLINIC ONLY)
CD4 % Helper T Cell: 27 % — ABNORMAL LOW (ref 33–65)
CD4 T Cell Abs: 642 /uL (ref 400–1790)

## 2023-08-01 LAB — COMPLETE METABOLIC PANEL WITHOUT GFR
AG Ratio: 2 (calc) (ref 1.0–2.5)
ALT: 18 U/L (ref 6–29)
AST: 25 U/L (ref 10–35)
Albumin: 4.9 g/dL (ref 3.6–5.1)
Alkaline phosphatase (APISO): 80 U/L (ref 37–153)
BUN: 17 mg/dL (ref 7–25)
CO2: 30 mmol/L (ref 20–32)
Calcium: 10 mg/dL (ref 8.6–10.4)
Chloride: 104 mmol/L (ref 98–110)
Creat: 0.81 mg/dL (ref 0.60–1.00)
Globulin: 2.5 g/dL (ref 1.9–3.7)
Glucose, Bld: 87 mg/dL (ref 65–99)
Potassium: 3.9 mmol/L (ref 3.5–5.3)
Sodium: 141 mmol/L (ref 135–146)
Total Bilirubin: 0.5 mg/dL (ref 0.2–1.2)
Total Protein: 7.4 g/dL (ref 6.1–8.1)

## 2023-08-01 LAB — CBC WITH DIFFERENTIAL/PLATELET
Absolute Lymphocytes: 2503 {cells}/uL (ref 850–3900)
Absolute Monocytes: 524 {cells}/uL (ref 200–950)
Basophils Absolute: 77 {cells}/uL (ref 0–200)
Basophils Relative: 1 %
Eosinophils Absolute: 239 {cells}/uL (ref 15–500)
Eosinophils Relative: 3.1 %
HCT: 47 % — ABNORMAL HIGH (ref 35.0–45.0)
Hemoglobin: 15.7 g/dL — ABNORMAL HIGH (ref 11.7–15.5)
MCH: 31.9 pg (ref 27.0–33.0)
MCHC: 33.4 g/dL (ref 32.0–36.0)
MCV: 95.5 fL (ref 80.0–100.0)
MPV: 10.2 fL (ref 7.5–12.5)
Monocytes Relative: 6.8 %
Neutro Abs: 4358 {cells}/uL (ref 1500–7800)
Neutrophils Relative %: 56.6 %
Platelets: 311 Thousand/uL (ref 140–400)
RBC: 4.92 Million/uL (ref 3.80–5.10)
RDW: 12.3 % (ref 11.0–15.0)
Total Lymphocyte: 32.5 %
WBC: 7.7 Thousand/uL (ref 3.8–10.8)

## 2023-08-01 LAB — HIV-1 RNA QUANT-NO REFLEX-BLD
HIV 1 RNA Quant: NOT DETECTED {copies}/mL
HIV-1 RNA Quant, Log: NOT DETECTED {Log_copies}/mL

## 2023-08-01 LAB — LIPID PANEL
Cholesterol: 160 mg/dL (ref <200)
HDL: 43 mg/dL — ABNORMAL LOW (ref >=50)
LDL Cholesterol (Calc): 94 mg/dL
Non-HDL Cholesterol (Calc): 117 mg/dL (ref <130)
Total CHOL/HDL Ratio: 3.7 (calc) (ref <5.0)
Triglycerides: 131 mg/dL (ref <150)

## 2023-08-04 ENCOUNTER — Other Ambulatory Visit: Payer: Self-pay

## 2023-08-04 NOTE — Progress Notes (Signed)
 Specialty Pharmacy Refill Coordination Note  [name] is a 73 y.o. female contacted today regarding refills of specialty medication(s) Bictegravir-Emtricitab-Tenofov (Biktarvy )   Patient requested Delivery   Delivery date: 08/08/23   Verified address: [address]  [place]   Medication will be filled on 08/07/23.   Deliver with OP meds.

## 2023-08-04 NOTE — Progress Notes (Signed)
 Specialty Pharmacy Ongoing Clinical Assessment Note  [name] is a 73 y.o. female who is being followed by the specialty pharmacy service for RxSp HIV   Patient's specialty medication(s) reviewed today: Bictegravir-Emtricitab-Tenofov (Biktarvy )   Missed doses in the last 4 weeks: 0   Patient/Caregiver did not have any additional questions or concerns.   Therapeutic benefit summary: Patient is achieving benefit   Adverse events/side effects summary: No adverse events/side effects   Patient's therapy is appropriate to: Continue    Goals Addressed             This Visit's Progress    Achieve Undetectable HIV Viral Load < 20   On track    Patient is on track. Patient will maintain adherence. Viral load remains undetectable long term.          Follow up: 12 months  [name] Specialty Pharmacist

## 2023-08-06 ENCOUNTER — Other Ambulatory Visit: Payer: Self-pay

## 2023-08-11 ENCOUNTER — Encounter: Payer: Self-pay | Admitting: Internal Medicine

## 2023-08-11 ENCOUNTER — Other Ambulatory Visit (HOSPITAL_COMMUNITY): Payer: Self-pay

## 2023-08-11 ENCOUNTER — Other Ambulatory Visit: Payer: Self-pay

## 2023-08-11 ENCOUNTER — Ambulatory Visit (INDEPENDENT_AMBULATORY_CARE_PROVIDER_SITE_OTHER): Payer: Medicare Other | Admitting: Internal Medicine

## 2023-08-11 VITALS — BP 151/85 | HR 86 | Temp 97.9°F | Ht 63.0 in | Wt 155.0 lb

## 2023-08-11 DIAGNOSIS — Z0000 Encounter for general adult medical examination without abnormal findings: Secondary | ICD-10-CM

## 2023-08-11 DIAGNOSIS — B20 Human immunodeficiency virus [HIV] disease: Secondary | ICD-10-CM

## 2023-08-11 DIAGNOSIS — M542 Cervicalgia: Secondary | ICD-10-CM | POA: Diagnosis not present

## 2023-08-11 DIAGNOSIS — Z79899 Other long term (current) drug therapy: Secondary | ICD-10-CM | POA: Diagnosis not present

## 2023-08-11 DIAGNOSIS — N816 Rectocele: Secondary | ICD-10-CM

## 2023-08-11 DIAGNOSIS — Z853 Personal history of malignant neoplasm of breast: Secondary | ICD-10-CM | POA: Diagnosis not present

## 2023-08-11 DIAGNOSIS — M62838 Other muscle spasm: Secondary | ICD-10-CM

## 2023-08-11 MED ORDER — CYCLOBENZAPRINE HCL 10 MG PO TABS
10.0000 mg | ORAL_TABLET | Freq: Every evening | ORAL | 5 refills | Status: DC | PRN
  Filled 2023-08-11: qty 30, 30d supply, fill #0
  Filled 2023-10-02: qty 30, 30d supply, fill #1
  Filled 2023-10-30: qty 30, 30d supply, fill #2
  Filled 2023-12-01: qty 30, 30d supply, fill #3
  Filled 2023-12-31: qty 30, 30d supply, fill #4
  Filled 2024-02-02: qty 30, 30d supply, fill #5

## 2023-08-11 NOTE — Progress Notes (Unsigned)
 RFV: hiv disease  Patient ID: [name], female   DOB: July 25, 1950, 73 y.o.   MRN: [id number]  HPI  [place] - 73yo F with HIV disease, well controlled, hx of breast ca, now in remisison. She reports her health is fine with exception she might have rectal prolapse. She is otherwise taking all her medications without difficulty   ROS: rectal prolapse- Bulge noted in vaginal. Hx of constipation - takes miralax and metamucil Outpatient Encounter Medications as of 08/11/2023  Medication Sig   amLODipine  (NORVASC ) 10 MG tablet Take 1 tablet (10 mg total) by mouth daily.   aspirin EC 81 MG tablet Take 81 mg by mouth daily.   benazepril  (LOTENSIN ) 40 MG tablet Take 1 tablet (40 mg total) by mouth daily.   bictegravir-emtricitabine -tenofovir  AF (BIKTARVY ) 50-200-25 MG TABS tablet Take 1 tablet by mouth daily.   Biotin 5000 MCG CAPS Take 5,000 mcg daily by mouth.   Coenzyme Q10 300 MG CAPS Take 300 capsules by mouth daily.   cyclobenzaprine  (FLEXERIL ) 10 MG tablet Take 1 tablet (10 mg total) by mouth at bedtime as needed.   fexofenadine (ALLEGRA) 180 MG tablet Take 180 mg daily by mouth.   gabapentin  (NEURONTIN ) 100 MG capsule Take 1 capsule (100 mg total) by mouth 2 (two) times daily. (Patient taking differently: Take 100 mg by mouth daily.)   metoprolol  tartrate (LOPRESSOR ) 25 MG tablet Take 0.5 tablets (12.5 mg total) by mouth 2 (two) times daily.   Multiple Vitamin (MULTIVITAMIN WITH MINERALS) TABS tablet Take 1 tablet by mouth daily.   Omega-3 Fatty Acids (FISH OIL) 1200 MG CAPS Take 1,200 mg by mouth 2 (two) times daily.   polyethylene glycol (MIRALAX / GLYCOLAX) packet Take 17 g by mouth daily.   rosuvastatin  (CRESTOR ) 5 MG tablet Take 1 tablet (5 mg total) by mouth at bedtime.   acetaminophen  (TYLENOL ) 500 MG tablet Take 500 mg by mouth every 6 (six) hours as needed. (Patient not taking: Reported on 02/10/2023)   Facility-Administered Encounter Medications as of 08/11/2023   Medication   sodium chloride  flush (NS) 0.9 % injection 10 mL     Patient Active Problem List   Diagnosis Date Noted   Aortic atherosclerosis (HCC) 10/07/2017   Oral thrush 06/26/2017   Dry mouth & dry eyes  06/26/2017   Muscle spasms of neck 11/25/2016   Drug-induced neutropenia (HCC) 04/23/2016   Malignant neoplasm of upper-outer quadrant of left breast in female, estrogen receptor negative (HCC) 12/11/2015   Hereditary and idiopathic peripheral neuropathy 07/16/2012   Lumbar neuritis 07/16/2012   Cervicalgia 07/16/2012   URI 12/02/2006   Human immunodeficiency virus (HIV) disease (HCC) 11/08/2005   Hyperlipidemia 11/08/2005   Essential hypertension 11/08/2005   BRONCHITIS 11/08/2005   Regional enteritis (HCC) 11/08/2005   SYMPTOM, PAINFUL RESPIRATION 11/08/2005     Health Maintenance Due  Topic Date Due   Medicare Annual Wellness (AWV)  Never done   Zoster Vaccines- Shingrix (1 of 2) Never done   DEXA SCAN  Never done   DTaP/Tdap/Td (3 - Td or Tdap) 11/02/2021   COVID-19 Vaccine (5 - 2024-25 season) 09/29/2022     Review of Systems 12 point ros except what is mentioned above Physical Exam   BP (!) 151/85   Pulse 86   Temp 97.9 F (36.6 C) (Temporal)   Ht 5' 3 (1.6 m)   Wt 155 lb (70.3 kg)   SpO2 99%   BMI 27.46 kg/m   Physical Exam  Constitutional:  oriented to person, place, and time. appears well-developed and well-nourished. No distress.  HENT: [place]/AT, PERRLA, no scleral icterus Mouth/Throat: Oropharynx is clear and moist. No oropharyngeal exudate.  Cardiovascular: Normal rate, regular rhythm and normal heart sounds. Exam reveals no gallop and no friction rub.  No murmur heard.  Pulmonary/Chest: Effort normal and breath sounds normal. No respiratory distress.  has no wheezes.  Neck = supple, no nuchal rigidity Abdominal: Soft. Bowel sounds are normal.  exhibits no distension. There is no tenderness.  Lymphadenopathy: no cervical adenopathy. No axillary  adenopathy Neurological: alert and oriented to person, place, and time.  Skin: Skin is warm and dry. No rash noted. No erythema.  Psychiatric: a normal mood and affect.  behavior is normal.   Lab Results  Component Value Date   CD4TCELL 27 (L) 07/30/2023   Lab Results  Component Value Date   CD4TABS 642 07/30/2023   CD4TABS 556 12/31/2021   CD4TABS 702 06/18/2021   Lab Results  Component Value Date   HIV1RNAQUANT NOT DETECTED 07/30/2023   Lab Results  Component Value Date   HEPBSAB No 03/24/2006   Lab Results  Component Value Date   LABRPR NON-REACTIVE 01/27/2023    CBC Lab Results  Component Value Date   WBC 7.7 07/30/2023   RBC 4.92 07/30/2023   HGB 15.7 (H) 07/30/2023   HCT 47.0 (H) 07/30/2023   PLT 311 07/30/2023   MCV 95.5 07/30/2023   MCH 31.9 07/30/2023   MCHC 33.4 07/30/2023   RDW 12.3 07/30/2023   LYMPHSABS 2,826 07/01/2022   MONOABS 0.7 02/01/2019   EOSABS 239 07/30/2023    BMET Lab Results  Component Value Date   NA 141 07/30/2023   K 3.9 07/30/2023   CL 104 07/30/2023   CO2 30 07/30/2023   GLUCOSE 87 07/30/2023   BUN 17 07/30/2023   CREATININE 0.81 07/30/2023   CALCIUM  10.0 07/30/2023   GFRNONAA 67 09/06/2019   GFRAA 78 09/06/2019      Assessment and Plan  Rectocele = refer to gynecology colorectal surgeons  Hiv disease = well controlled, cd 4 count of 642/VL<20; dx in Aug 20, 1990 (her husband died in 52) -- - living for 86yrs.  Long term medication = cr is stable.   Neck pain/msk = refill on flexeril  rx  Health maintenance = will measles ig g  Hx of breast cancer = had mammogram in June 4, 25: continue with year mammo. Will check with lindsay causey. Previously saw dr layla, who is now retired. Now 7 yrs out.  Status post left lumpectomy/ sentinel lymph node sampling 01/01/2016 for a pT1c pN0, stage IA  Invasive ductal carcinoma, grade 3, with negative margins.

## 2023-08-12 LAB — RUBEOLA ANTIBODY IGG: Rubeola IgG: 173 [AU]/ml

## 2023-09-01 ENCOUNTER — Other Ambulatory Visit (HOSPITAL_COMMUNITY): Payer: Self-pay

## 2023-09-01 ENCOUNTER — Other Ambulatory Visit: Payer: Self-pay

## 2023-09-01 ENCOUNTER — Other Ambulatory Visit: Payer: Self-pay | Admitting: Internal Medicine

## 2023-09-01 MED ORDER — METOPROLOL TARTRATE 25 MG PO TABS
12.5000 mg | ORAL_TABLET | Freq: Two times a day (BID) | ORAL | 0 refills | Status: DC
  Filled 2023-09-01 – 2023-09-02 (×2): qty 30, 30d supply, fill #0

## 2023-09-01 NOTE — Progress Notes (Signed)
 Specialty Pharmacy Refill Coordination Note  [name] is a 73 y.o. female contacted today regarding refills of specialty medication(s) Bictegravir-Emtricitab-Tenofov (Biktarvy )   Patient requested Delivery   Delivery date: 09/04/23   Verified address: [address]  [place]   Medication will be filled on 08.06.25.

## 2023-09-02 ENCOUNTER — Other Ambulatory Visit (HOSPITAL_COMMUNITY): Payer: Self-pay

## 2023-09-02 ENCOUNTER — Other Ambulatory Visit: Payer: Self-pay

## 2023-09-25 NOTE — Progress Notes (Signed)
 73 y.o. G70P1001 female with prior hysterectomy/BSO (endometriosis, 40s), HIV, left breast cancer (2017) here for rectocele. Widowed.  She reports noticed the bulge since Fall of 2024 - lower back pain and pelvic pressure. Chronic constipation, managed with Miralax. No PMB.  Last mammogram: 07/02/23 density B; Bi-Rads 1 neg Sexually active: Not currently   GYN HISTORY: No sig hx  OB History  Gravida Para Term Preterm AB Living  1 1 1   1   SAB IAB Ectopic Multiple Live Births      1    # Outcome Date GA Lbr Len/2nd Weight Sex Type Anes PTL Lv  1 Term      Vag-Spont   LIV   Past Medical History:  Diagnosis Date   HIV (human immunodeficiency virus infection) (HCC)    Hypertension    Malignant neoplasm of upper-outer quadrant of left female breast (HCC) 12/11/2015   Migraine    Neuropathy    Personal history of chemotherapy 2018   Personal history of radiation therapy 2018   PONV (postoperative nausea and vomiting)    said usually has to have scop patch   Rash and nonspecific skin eruption 10/16/2015   Spinal stenosis    Past Surgical History:  Procedure Laterality Date   ABDOMINAL HYSTERECTOMY  1990   BREAST BIOPSY Left 12/07/2015   BREAST LUMPECTOMY Left 01/01/2016   BREAST LUMPECTOMY WITH RADIOACTIVE SEED AND SENTINEL LYMPH NODE BIOPSY Left 01/01/2016   Procedure: LEFT BREAST LUMPECTOMY WITH RADIOACTIVE SEED AND SENTINEL LYMPH NODE BIOPSY;  Surgeon: Alm Angle, MD;  Location: [facility];  Service: General;  Laterality: Left;   CARPAL TUNNEL RELEASE     both hands   CERVICAL FUSION  1989   PORTACATH PLACEMENT Right 01/01/2016   Procedure: INSERTION PORT-A-CATH WITH US ;  Surgeon: Alm Angle, MD;  Location: [facility];  Service: General;  Laterality: Right;   TONSILLECTOMY     Current Outpatient Medications on File Prior to Visit  Medication Sig Dispense Refill   amLODipine  (NORVASC ) 10 MG tablet Take 1 tablet (10 mg total) by mouth  daily. 90 tablet 3   aspirin EC 81 MG tablet Take 81 mg by mouth daily.     benazepril  (LOTENSIN ) 40 MG tablet Take 1 tablet (40 mg total) by mouth daily. 90 tablet 3   bictegravir-emtricitabine -tenofovir  AF (BIKTARVY ) 50-200-25 MG TABS tablet Take 1 tablet by mouth daily. 30 tablet 11   Biotin 5000 MCG CAPS Take 5,000 mcg daily by mouth.     Coenzyme Q10 300 MG CAPS Take 300 capsules by mouth daily.     cyclobenzaprine  (FLEXERIL ) 10 MG tablet Take 1 tablet (10 mg total) by mouth at bedtime as needed. 30 tablet 5   fexofenadine (ALLEGRA) 180 MG tablet Take 180 mg daily by mouth.     gabapentin  (NEURONTIN ) 100 MG capsule Take 1 capsule (100 mg total) by mouth 2 (two) times daily. 60 capsule 11   metoprolol  tartrate (LOPRESSOR ) 25 MG tablet Take 0.5 tablets (12.5 mg total) by mouth 2 (two) times daily. 30 tablet 0   Multiple Vitamin (MULTIVITAMIN WITH MINERALS) TABS tablet Take 1 tablet by mouth daily.     Omega-3 Fatty Acids (FISH OIL) 1200 MG CAPS Take 1,200 mg by mouth 2 (two) times daily.     polyethylene glycol (MIRALAX / GLYCOLAX) packet Take 17 g by mouth daily.     rosuvastatin  (CRESTOR ) 5 MG tablet Take 1 tablet (5 mg total) by mouth at bedtime.  90 tablet 3   Current Facility-Administered Medications on File Prior to Visit  Medication Dose Route Frequency Provider Last Rate Last Admin   sodium chloride  flush (NS) 0.9 % injection 10 mL  10 mL Intravenous PRN Magrinat, Sandria BROCKS, MD   10 mL at 05/21/16 1022   Allergies  Allergen Reactions   Hydrochlorothiazide Rash      PE Today's Vitals   09/30/23 0928  BP: 122/62  Pulse: 80  Temp: 98.1 F (36.7 C)  TempSrc: Oral  SpO2: 98%  Weight: 150 lb (68 kg)  Height: 5' 4 (1.626 m)   Body mass index is 25.75 kg/m.  Physical Exam Vitals reviewed. Exam conducted with a chaperone present.  Constitutional:      General: She is not in acute distress.    Appearance: Normal appearance.  HENT:     Head: Normocephalic and atraumatic.      Nose: Nose normal.  Eyes:     Extraocular Movements: Extraocular movements intact.     Conjunctiva/sclera: Conjunctivae normal.  Pulmonary:     Effort: Pulmonary effort is normal.  Genitourinary:    Exam position: Lithotomy position.     Vagina: Prolapsed vaginal walls (Grade 3 cystocele, Grade 1 rectocele) present. No vaginal discharge.     Uterus: Absent.      Adnexa: Right adnexa normal and left adnexa normal.     Comments: Vulva atrophy Uterus and cervix absent Musculoskeletal:        General: Normal range of motion.     Cervical back: Normal range of motion.  Neurological:     General: No focal deficit present.     Mental Status: She is alert.  Psychiatric:        Mood and Affect: Mood normal.        Behavior: Behavior normal.      Assessment and Plan:        Baden-Walker grade 3 cystocele  Discussed cystocele. Asymptomatic cystocele can be managed with weight loss, kegel exercises, PFPT, and vaginal estrogen. For symptomatic prolapse, she can consider pessary or surgical management. She in undecided at this time and will call with her decision.   Vera LULLA Pa, MD

## 2023-09-26 ENCOUNTER — Encounter: Payer: Self-pay | Admitting: Adult Health

## 2023-09-30 ENCOUNTER — Encounter: Payer: Self-pay | Admitting: Obstetrics and Gynecology

## 2023-09-30 ENCOUNTER — Other Ambulatory Visit (HOSPITAL_COMMUNITY): Payer: Self-pay

## 2023-09-30 ENCOUNTER — Ambulatory Visit (INDEPENDENT_AMBULATORY_CARE_PROVIDER_SITE_OTHER): Payer: Self-pay | Admitting: Obstetrics and Gynecology

## 2023-09-30 ENCOUNTER — Encounter: Payer: Self-pay | Admitting: Adult Health

## 2023-09-30 VITALS — BP 122/62 | HR 80 | Temp 98.1°F | Ht 64.0 in | Wt 150.0 lb

## 2023-09-30 DIAGNOSIS — N8110 Cystocele, unspecified: Secondary | ICD-10-CM | POA: Diagnosis not present

## 2023-10-02 ENCOUNTER — Other Ambulatory Visit (HOSPITAL_COMMUNITY): Payer: Self-pay

## 2023-10-02 ENCOUNTER — Other Ambulatory Visit: Payer: Self-pay

## 2023-10-02 ENCOUNTER — Other Ambulatory Visit: Payer: Self-pay | Admitting: Internal Medicine

## 2023-10-02 MED ORDER — METOPROLOL TARTRATE 25 MG PO TABS
12.5000 mg | ORAL_TABLET | Freq: Two times a day (BID) | ORAL | 3 refills | Status: DC
  Filled 2023-10-02: qty 30, 30d supply, fill #0
  Filled 2023-10-30: qty 30, 30d supply, fill #1
  Filled 2023-12-01: qty 30, 30d supply, fill #2
  Filled 2023-12-31: qty 30, 30d supply, fill #3

## 2023-10-02 NOTE — Telephone Encounter (Signed)
 Message sent to provider

## 2023-10-02 NOTE — Progress Notes (Signed)
 Specialty Pharmacy Refill Coordination Note  [name] is a 73 y.o. female contacted today regarding refills of specialty medication(s) Bictegravir-Emtricitab-Tenofov (Biktarvy )   Patient requested Delivery   Delivery date: 10/09/23   Verified address: [address]  [place]   Medication will be filled on 10/08/23.

## 2023-10-02 NOTE — Telephone Encounter (Signed)
 Ok to continue per Dr. Luiz.   [name], BSN, RN

## 2023-10-08 ENCOUNTER — Other Ambulatory Visit: Payer: Self-pay

## 2023-10-28 ENCOUNTER — Telehealth: Payer: Self-pay

## 2023-10-28 NOTE — Telephone Encounter (Signed)
 [facility] Patient Advocate Encounter   I was successful in securing patient a $5000.00 grant from [employer] ([employer]) to provide copayment coverage for Biktarvy.  This will make the out of pocket cost $0.00.     I have spoken with the patient.    The billing information is as follows and has been shared with [facility].         Patient knows to call the office with questions or concerns.  Clearance Coots, CPhT Specialty Pharmacy Patient [facility] Phone: [phone] Fax:  [phone]

## 2023-10-30 ENCOUNTER — Other Ambulatory Visit: Payer: Self-pay

## 2023-10-30 NOTE — Progress Notes (Signed)
 Specialty Pharmacy Refill Coordination Note  [name] is a 73 y.o. female contacted today regarding refills of specialty medication(s) Bictegravir-Emtricitab-Tenofov (Biktarvy )   Patient requested Delivery   Delivery date: 11/04/23   Verified address: [address]  [place]   Medication will be filled on 11/03/23.

## 2023-11-01 ENCOUNTER — Other Ambulatory Visit (HOSPITAL_COMMUNITY): Payer: Self-pay

## 2023-11-03 ENCOUNTER — Other Ambulatory Visit (HOSPITAL_COMMUNITY): Payer: Self-pay

## 2023-11-10 ENCOUNTER — Ambulatory Visit (INDEPENDENT_AMBULATORY_CARE_PROVIDER_SITE_OTHER)

## 2023-11-10 ENCOUNTER — Other Ambulatory Visit: Payer: Self-pay

## 2023-11-10 DIAGNOSIS — Z23 Encounter for immunization: Secondary | ICD-10-CM

## 2023-11-26 ENCOUNTER — Other Ambulatory Visit (HOSPITAL_COMMUNITY): Payer: Self-pay

## 2023-11-28 ENCOUNTER — Other Ambulatory Visit (HOSPITAL_COMMUNITY): Payer: Self-pay

## 2023-12-01 ENCOUNTER — Other Ambulatory Visit: Payer: Self-pay

## 2023-12-01 NOTE — Progress Notes (Signed)
 Specialty Pharmacy Refill Coordination Note  [name] is a 73 y.o. female contacted today regarding refills of specialty medication(s) Bictegravir-Emtricitab-Tenofov (Biktarvy )   Patient requested Delivery   Delivery date: 12/05/23   Verified address: [address]  [place]   Medication will be filled on: 12/04/23

## 2023-12-03 ENCOUNTER — Other Ambulatory Visit: Payer: Self-pay

## 2023-12-03 DIAGNOSIS — H43811 Vitreous degeneration, right eye: Secondary | ICD-10-CM | POA: Diagnosis not present

## 2023-12-03 DIAGNOSIS — H40012 Open angle with borderline findings, low risk, left eye: Secondary | ICD-10-CM | POA: Diagnosis not present

## 2023-12-03 DIAGNOSIS — H2513 Age-related nuclear cataract, bilateral: Secondary | ICD-10-CM | POA: Diagnosis not present

## 2023-12-03 DIAGNOSIS — H18513 Endothelial corneal dystrophy, bilateral: Secondary | ICD-10-CM | POA: Diagnosis not present

## 2023-12-04 ENCOUNTER — Other Ambulatory Visit: Payer: Self-pay

## 2023-12-26 ENCOUNTER — Other Ambulatory Visit (HOSPITAL_COMMUNITY): Payer: Self-pay

## 2023-12-30 ENCOUNTER — Other Ambulatory Visit: Payer: Self-pay

## 2023-12-31 ENCOUNTER — Other Ambulatory Visit: Payer: Self-pay

## 2023-12-31 ENCOUNTER — Other Ambulatory Visit (HOSPITAL_COMMUNITY): Payer: Self-pay

## 2023-12-31 NOTE — Progress Notes (Signed)
 Specialty Pharmacy Refill Coordination Note  [name] is a 73 y.o. female contacted today regarding refills of specialty medication(s) Bictegravir-Emtricitab-Tenofov (Biktarvy )   Patient requested Delivery   Delivery date: 01/07/24   Verified address: [address]  [place]   Medication will be filled on: 01/06/24

## 2024-01-06 ENCOUNTER — Other Ambulatory Visit: Payer: Self-pay

## 2024-01-26 ENCOUNTER — Other Ambulatory Visit: Payer: Self-pay

## 2024-01-26 ENCOUNTER — Other Ambulatory Visit: Payer: Self-pay | Admitting: Internal Medicine

## 2024-01-26 DIAGNOSIS — B20 Human immunodeficiency virus [HIV] disease: Secondary | ICD-10-CM

## 2024-01-26 MED ORDER — BIKTARVY 50-200-25 MG PO TABS
1.0000 | ORAL_TABLET | Freq: Every day | ORAL | 2 refills | Status: AC
  Filled 2024-01-26 – 2024-01-30 (×3): qty 30, 30d supply, fill #0
  Filled 2024-03-01: qty 30, 30d supply, fill #1

## 2024-01-27 ENCOUNTER — Other Ambulatory Visit: Payer: Self-pay

## 2024-01-27 DIAGNOSIS — B20 Human immunodeficiency virus [HIV] disease: Secondary | ICD-10-CM

## 2024-01-28 ENCOUNTER — Other Ambulatory Visit: Payer: Self-pay

## 2024-01-30 ENCOUNTER — Other Ambulatory Visit

## 2024-01-30 ENCOUNTER — Other Ambulatory Visit: Payer: Self-pay

## 2024-01-30 ENCOUNTER — Encounter: Payer: Self-pay | Admitting: Adult Health

## 2024-01-30 DIAGNOSIS — B20 Human immunodeficiency virus [HIV] disease: Secondary | ICD-10-CM

## 2024-02-02 ENCOUNTER — Other Ambulatory Visit: Payer: Self-pay | Admitting: Internal Medicine

## 2024-02-02 ENCOUNTER — Other Ambulatory Visit (HOSPITAL_COMMUNITY): Payer: Self-pay

## 2024-02-02 ENCOUNTER — Other Ambulatory Visit: Payer: Self-pay

## 2024-02-02 DIAGNOSIS — E7800 Pure hypercholesterolemia, unspecified: Secondary | ICD-10-CM

## 2024-02-02 DIAGNOSIS — I10 Essential (primary) hypertension: Secondary | ICD-10-CM

## 2024-02-02 MED ORDER — AMLODIPINE BESYLATE 10 MG PO TABS
10.0000 mg | ORAL_TABLET | Freq: Every day | ORAL | 1 refills | Status: AC
  Filled 2024-02-02: qty 90, 90d supply, fill #0

## 2024-02-02 MED ORDER — METOPROLOL TARTRATE 25 MG PO TABS
12.5000 mg | ORAL_TABLET | Freq: Two times a day (BID) | ORAL | 1 refills | Status: AC
  Filled 2024-02-02: qty 30, 30d supply, fill #0
  Filled 2024-03-02 – 2024-03-04 (×2): qty 30, 30d supply, fill #1

## 2024-02-02 MED ORDER — ROSUVASTATIN CALCIUM 5 MG PO TABS
5.0000 mg | ORAL_TABLET | Freq: Every day | ORAL | 1 refills | Status: AC
  Filled 2024-02-02: qty 90, 90d supply, fill #0

## 2024-02-02 MED ORDER — BENAZEPRIL HCL 40 MG PO TABS
40.0000 mg | ORAL_TABLET | Freq: Every day | ORAL | 1 refills | Status: AC
  Filled 2024-02-02: qty 90, 90d supply, fill #0

## 2024-02-02 NOTE — Progress Notes (Signed)
 Specialty Pharmacy Refill Coordination Note  [name] is a 74 y.o. female contacted today regarding refills of specialty medication(s) Bictegravir-Emtricitab-Tenofov (Biktarvy )   Patient requested Delivery   Delivery date: 02/05/24   Verified address: [address]  [place]   Medication will be filled on: 02/04/24

## 2024-02-03 ENCOUNTER — Telehealth: Payer: Self-pay

## 2024-02-03 ENCOUNTER — Other Ambulatory Visit: Payer: Self-pay

## 2024-02-03 LAB — T-HELPER CELLS (CD4) COUNT (NOT AT ARMC)
Absolute CD4: 855 {cells}/uL (ref 490–1740)
CD4 T Helper %: 28 % — ABNORMAL LOW (ref 30–61)
Total lymphocyte count: 3017 {cells}/uL (ref 850–3900)

## 2024-02-03 LAB — HIV-1 RNA QUANT-NO REFLEX-BLD
HIV 1 RNA Quant: NOT DETECTED {copies}/mL
HIV-1 RNA Quant, Log: NOT DETECTED {Log_copies}/mL

## 2024-02-03 NOTE — Telephone Encounter (Signed)
 [name] returned my call. [employer] stated that patients would need a referral from PCP for surgery EXCEPT ophthalmology. No referral needed.   [name] [name], CMA

## 2024-02-03 NOTE — Telephone Encounter (Signed)
 Patient called asking if Dr.Snider would sign surgical clearance for eye surgery. Patient stated that her eye doctor stated that she would need a referral. I called and left a message for Samule at [facility] for clarification.   [name] [name], CMA

## 2024-02-11 ENCOUNTER — Other Ambulatory Visit: Payer: Self-pay

## 2024-02-11 ENCOUNTER — Telehealth: Admitting: Internal Medicine

## 2024-02-11 DIAGNOSIS — Z79899 Other long term (current) drug therapy: Secondary | ICD-10-CM

## 2024-02-11 DIAGNOSIS — B20 Human immunodeficiency virus [HIV] disease: Secondary | ICD-10-CM

## 2024-02-11 DIAGNOSIS — G629 Polyneuropathy, unspecified: Secondary | ICD-10-CM

## 2024-02-11 DIAGNOSIS — G620 Drug-induced polyneuropathy: Secondary | ICD-10-CM

## 2024-02-11 NOTE — Progress Notes (Signed)
 Virtual Visit via Video Note  I connected with [name] on 02/11/2024 at  8:45 AM EST by a video enabled telemedicine application and verified that I am speaking with the correct person using two identifiers.  Location: Patient: at home Provider: at clinic   I discussed the limitations of evaluation and management by telemedicine and the availability of in person appointments. The patient expressed understanding and agreed to proceed.  History of Present Illness: 74yo F with CD 4 count of 855/VL<20, on biktarvy , doing well without missing any doses, having cataract surgery tomorrow. Uterine prolapse seen by gynecology, mentioned different options for grade 2 prolapse. No other health issues. She continues to take her hiv medications without missing a dose.   Observations/Objective:  GEN= A x o by 3 in nad HEENT= PERRLA, EOMI, no scleral icterus Pulm = normal, respiratory rate is WNL  Labs: Lab Results  Component Value Date   CREATININE 0.81 07/30/2023    Assessment and Plan: Hiv disease = continue with biktarvy ; discussed that her labs look like she still has well controlled hiv disease, remains undetectable  Long term medication management = cr is at her baseline. Plan to recheck at her next visit.  Peripheral neuropathy = give refills for gabapentin   Follow Up Instructions: continue hiv meds see back in 6 months    I discussed the assessment and treatment plan with the patient. The patient was provided an opportunity to ask questions and all were answered. The patient agreed with the plan and demonstrated an understanding of the instructions.  I personally spent a total of 20 minutes in the care of the patient today including preparing to see the patient, getting/reviewing separately obtained history, counseling and educating, placing orders, independently interpreting results, and communicating results.   Montie Bologna, MD

## 2024-02-18 ENCOUNTER — Other Ambulatory Visit: Payer: Self-pay | Admitting: Internal Medicine

## 2024-02-18 DIAGNOSIS — Z1231 Encounter for screening mammogram for malignant neoplasm of breast: Secondary | ICD-10-CM

## 2024-02-27 ENCOUNTER — Other Ambulatory Visit (HOSPITAL_COMMUNITY): Payer: Self-pay

## 2024-03-01 ENCOUNTER — Other Ambulatory Visit: Payer: Self-pay

## 2024-03-02 ENCOUNTER — Other Ambulatory Visit: Payer: Self-pay

## 2024-03-02 ENCOUNTER — Other Ambulatory Visit (HOSPITAL_COMMUNITY): Payer: Self-pay

## 2024-03-02 ENCOUNTER — Other Ambulatory Visit: Payer: Self-pay | Admitting: Internal Medicine

## 2024-03-02 DIAGNOSIS — M62838 Other muscle spasm: Secondary | ICD-10-CM

## 2024-03-02 DIAGNOSIS — G620 Drug-induced polyneuropathy: Secondary | ICD-10-CM

## 2024-03-02 MED ORDER — GABAPENTIN 100 MG PO CAPS
100.0000 mg | ORAL_CAPSULE | Freq: Two times a day (BID) | ORAL | 5 refills | Status: AC
  Filled 2024-03-02: qty 60, 30d supply, fill #0

## 2024-03-02 MED ORDER — CYCLOBENZAPRINE HCL 10 MG PO TABS
10.0000 mg | ORAL_TABLET | Freq: Every evening | ORAL | 5 refills | Status: AC | PRN
  Filled 2024-03-02: qty 30, 30d supply, fill #0

## 2024-03-02 NOTE — Telephone Encounter (Signed)
 Secure chat sent to provider.   Mattias Walmsley, BSN, RN

## 2024-03-02 NOTE — Progress Notes (Signed)
 Specialty Pharmacy Refill Coordination Note  [name] is a 74 y.o. female contacted today regarding refills of specialty medication(s) Bictegravir-Emtricitab-Tenofov (Biktarvy )   Patient requested Delivery   Delivery date: 03/05/24   Verified address: [address]  [place]   Medication will be filled on: 03/04/24

## 2024-03-03 ENCOUNTER — Other Ambulatory Visit: Payer: Self-pay

## 2024-03-04 ENCOUNTER — Other Ambulatory Visit: Payer: Self-pay

## 2024-07-02 ENCOUNTER — Ambulatory Visit

## 2024-07-28 ENCOUNTER — Other Ambulatory Visit: Payer: Self-pay

## 2024-08-11 ENCOUNTER — Ambulatory Visit: Payer: Self-pay | Admitting: Internal Medicine
